# Patient Record
Sex: Female | Born: 1965 | Race: White | Hispanic: No | Marital: Single | Smoking: Current every day smoker
Health system: Southern US, Community
[De-identification: ages and names within clinical notes are randomized; demographics above are authoritative.]

## PROBLEM LIST (undated history)

## (undated) DIAGNOSIS — K861 Other chronic pancreatitis: Secondary | ICD-10-CM

## (undated) DIAGNOSIS — E039 Hypothyroidism, unspecified: Secondary | ICD-10-CM

## (undated) DIAGNOSIS — F1010 Alcohol abuse, uncomplicated: Secondary | ICD-10-CM

## (undated) DIAGNOSIS — I10 Essential (primary) hypertension: Secondary | ICD-10-CM

## (undated) DIAGNOSIS — B1920 Unspecified viral hepatitis C without hepatic coma: Secondary | ICD-10-CM

## (undated) DIAGNOSIS — F1011 Alcohol abuse, in remission: Secondary | ICD-10-CM

## (undated) DIAGNOSIS — F329 Major depressive disorder, single episode, unspecified: Secondary | ICD-10-CM

## (undated) DIAGNOSIS — C801 Malignant (primary) neoplasm, unspecified: Secondary | ICD-10-CM

## (undated) DIAGNOSIS — F32A Depression, unspecified: Secondary | ICD-10-CM

## (undated) DIAGNOSIS — R569 Unspecified convulsions: Secondary | ICD-10-CM

## (undated) DIAGNOSIS — M545 Low back pain, unspecified: Secondary | ICD-10-CM

## (undated) DIAGNOSIS — F1991 Other psychoactive substance use, unspecified, in remission: Secondary | ICD-10-CM

## (undated) DIAGNOSIS — K863 Pseudocyst of pancreas: Secondary | ICD-10-CM

## (undated) DIAGNOSIS — K315 Obstruction of duodenum: Secondary | ICD-10-CM

## (undated) DIAGNOSIS — J449 Chronic obstructive pulmonary disease, unspecified: Secondary | ICD-10-CM

## (undated) DIAGNOSIS — G894 Chronic pain syndrome: Secondary | ICD-10-CM

## (undated) DIAGNOSIS — J45909 Unspecified asthma, uncomplicated: Secondary | ICD-10-CM

## (undated) DIAGNOSIS — Z87898 Personal history of other specified conditions: Secondary | ICD-10-CM

## (undated) HISTORY — DX: Chronic pain syndrome: G89.4

## (undated) HISTORY — DX: Alcohol abuse, in remission: F10.11

## (undated) HISTORY — DX: Obstruction of duodenum: K31.5

## (undated) HISTORY — PX: TOTAL THYROIDECTOMY: SHX2547

## (undated) HISTORY — DX: Essential (primary) hypertension: I10

## (undated) HISTORY — DX: Pseudocyst of pancreas: K86.3

## (undated) HISTORY — DX: Low back pain, unspecified: M54.50

## (undated) HISTORY — DX: Low back pain: M54.5

---

## 1999-04-27 ENCOUNTER — Encounter: Payer: Self-pay | Admitting: Emergency Medicine

## 1999-04-27 ENCOUNTER — Emergency Department (HOSPITAL_COMMUNITY): Admission: EM | Admit: 1999-04-27 | Discharge: 1999-04-27 | Payer: Self-pay | Admitting: Emergency Medicine

## 2006-12-22 ENCOUNTER — Emergency Department: Payer: Self-pay | Admitting: Emergency Medicine

## 2007-12-13 ENCOUNTER — Emergency Department: Payer: Self-pay | Admitting: Unknown Physician Specialty

## 2008-07-15 ENCOUNTER — Inpatient Hospital Stay: Payer: Self-pay | Admitting: Internal Medicine

## 2009-09-15 HISTORY — PX: GASTROSTOMY TUBE PLACEMENT: SHX655

## 2010-05-24 ENCOUNTER — Emergency Department: Payer: Self-pay | Admitting: Emergency Medicine

## 2010-06-02 ENCOUNTER — Emergency Department: Payer: Self-pay | Admitting: Internal Medicine

## 2010-06-06 ENCOUNTER — Emergency Department: Payer: Self-pay | Admitting: Emergency Medicine

## 2010-06-12 ENCOUNTER — Inpatient Hospital Stay: Payer: Self-pay | Admitting: Psychiatry

## 2010-07-24 ENCOUNTER — Emergency Department: Payer: Self-pay | Admitting: Emergency Medicine

## 2010-10-25 ENCOUNTER — Inpatient Hospital Stay: Payer: Self-pay | Admitting: Internal Medicine

## 2010-10-27 ENCOUNTER — Emergency Department: Payer: Self-pay | Admitting: Emergency Medicine

## 2010-11-03 ENCOUNTER — Inpatient Hospital Stay: Payer: Self-pay | Admitting: Internal Medicine

## 2010-11-21 ENCOUNTER — Inpatient Hospital Stay: Payer: Self-pay | Admitting: Internal Medicine

## 2010-12-26 DIAGNOSIS — F1911 Other psychoactive substance abuse, in remission: Secondary | ICD-10-CM | POA: Insufficient documentation

## 2010-12-26 DIAGNOSIS — F329 Major depressive disorder, single episode, unspecified: Secondary | ICD-10-CM | POA: Insufficient documentation

## 2010-12-26 DIAGNOSIS — F411 Generalized anxiety disorder: Secondary | ICD-10-CM | POA: Insufficient documentation

## 2011-01-01 ENCOUNTER — Inpatient Hospital Stay: Payer: Self-pay | Admitting: Surgery

## 2011-01-14 DIAGNOSIS — F17220 Nicotine dependence, chewing tobacco, uncomplicated: Secondary | ICD-10-CM | POA: Insufficient documentation

## 2011-01-14 DIAGNOSIS — F17200 Nicotine dependence, unspecified, uncomplicated: Secondary | ICD-10-CM | POA: Insufficient documentation

## 2011-01-31 ENCOUNTER — Emergency Department: Payer: Self-pay | Admitting: Emergency Medicine

## 2011-02-02 ENCOUNTER — Emergency Department: Payer: Self-pay | Admitting: Emergency Medicine

## 2011-02-14 DIAGNOSIS — K319 Disease of stomach and duodenum, unspecified: Secondary | ICD-10-CM | POA: Insufficient documentation

## 2011-03-30 ENCOUNTER — Observation Stay: Payer: Self-pay | Admitting: Internal Medicine

## 2011-04-07 ENCOUNTER — Emergency Department: Payer: Self-pay | Admitting: Emergency Medicine

## 2011-04-23 ENCOUNTER — Observation Stay: Payer: Self-pay | Admitting: Internal Medicine

## 2011-05-04 ENCOUNTER — Emergency Department: Payer: Self-pay | Admitting: Emergency Medicine

## 2011-05-16 DIAGNOSIS — K315 Obstruction of duodenum: Secondary | ICD-10-CM

## 2011-05-16 HISTORY — DX: Obstruction of duodenum: K31.5

## 2011-06-01 ENCOUNTER — Emergency Department: Payer: Self-pay | Admitting: Emergency Medicine

## 2011-06-04 ENCOUNTER — Emergency Department: Payer: Self-pay | Admitting: *Deleted

## 2011-06-19 ENCOUNTER — Emergency Department: Payer: Self-pay | Admitting: Emergency Medicine

## 2011-06-21 ENCOUNTER — Emergency Department: Payer: Self-pay | Admitting: Emergency Medicine

## 2011-06-23 ENCOUNTER — Emergency Department: Payer: Self-pay

## 2011-07-06 ENCOUNTER — Emergency Department: Payer: Self-pay | Admitting: Emergency Medicine

## 2011-07-24 ENCOUNTER — Emergency Department: Payer: Self-pay | Admitting: *Deleted

## 2011-07-24 ENCOUNTER — Emergency Department: Payer: Self-pay | Admitting: Unknown Physician Specialty

## 2011-07-30 ENCOUNTER — Emergency Department: Payer: Self-pay | Admitting: Unknown Physician Specialty

## 2011-08-11 ENCOUNTER — Emergency Department: Payer: Self-pay | Admitting: Emergency Medicine

## 2011-08-13 ENCOUNTER — Emergency Department: Payer: Self-pay | Admitting: Emergency Medicine

## 2011-08-21 ENCOUNTER — Inpatient Hospital Stay: Payer: Self-pay | Admitting: Student

## 2011-08-26 ENCOUNTER — Inpatient Hospital Stay: Payer: Self-pay | Admitting: Psychiatry

## 2011-09-01 ENCOUNTER — Emergency Department: Payer: Self-pay | Admitting: *Deleted

## 2011-09-15 ENCOUNTER — Emergency Department: Payer: Self-pay | Admitting: Emergency Medicine

## 2011-09-19 ENCOUNTER — Inpatient Hospital Stay: Payer: Self-pay | Admitting: Specialist

## 2011-09-19 LAB — URINALYSIS, COMPLETE
Bilirubin,UR: NEGATIVE
Leukocyte Esterase: NEGATIVE
Nitrite: NEGATIVE
Ph: 7 (ref 4.5–8.0)
Protein: NEGATIVE
Squamous Epithelial: <1

## 2011-09-19 LAB — COMPREHENSIVE METABOLIC PANEL
Alkaline Phosphatase: 111 U/L (ref 50–136)
Anion Gap: 10 (ref 7–16)
BUN: 9 mg/dL (ref 7–18)
Bilirubin,Total: 0.5 mg/dL (ref 0.2–1.0)
Calcium, Total: 8.4 mg/dL — ABNORMAL LOW (ref 8.5–10.1)
Chloride: 107 mmol/L (ref 98–107)
Co2: 23 mmol/L (ref 21–32)
Creatinine: 0.61 mg/dL (ref 0.60–1.30)
EGFR (African American): >60
EGFR (Non-African Amer.): >60
Osmolality: 280 (ref 275–301)
Potassium: 3.3 mmol/L — ABNORMAL LOW (ref 3.5–5.1)
SGOT(AST): 48 U/L — ABNORMAL HIGH (ref 15–37)
Sodium: 140 mmol/L (ref 136–145)

## 2011-09-19 LAB — CBC
HCT: 35.1 % (ref 35.0–47.0)
MCHC: 33.8 g/dL (ref 32.0–36.0)
MCV: 96 fL (ref 80–100)
Platelet: 165 10*3/uL (ref 150–440)
RDW: 14.8 % — ABNORMAL HIGH (ref 11.5–14.5)
WBC: 7.7 10*3/uL (ref 3.6–11.0)

## 2011-09-19 LAB — TSH: Thyroid Stimulating Horm: 0.566 u[IU]/mL

## 2011-09-19 LAB — LIPASE, BLOOD: Lipase: 75 U/L (ref 73–393)

## 2011-09-20 LAB — COMPREHENSIVE METABOLIC PANEL
Albumin: 2.8 g/dL — ABNORMAL LOW (ref 3.4–5.0)
Anion Gap: 7 (ref 7–16)
Calcium, Total: 7.8 mg/dL — ABNORMAL LOW (ref 8.5–10.1)
Chloride: 112 mmol/L — ABNORMAL HIGH (ref 98–107)
Co2: 27 mmol/L (ref 21–32)
EGFR (African American): >60
Glucose: 80 mg/dL (ref 65–99)
Osmolality: 286 (ref 275–301)
Potassium: 3.7 mmol/L (ref 3.5–5.1)
SGOT(AST): 32 U/L (ref 15–37)
Sodium: 146 mmol/L — ABNORMAL HIGH (ref 136–145)

## 2011-09-20 LAB — CBC WITH DIFFERENTIAL/PLATELET
Eosinophil %: 2.5 %
HCT: 31.2 % — ABNORMAL LOW (ref 35.0–47.0)
Lymphocyte #: 1.9 10*3/uL (ref 1.0–3.6)
Lymphocyte %: 42.9 %
MCH: 32.2 pg (ref 26.0–34.0)
MCV: 97 fL (ref 80–100)
Monocyte #: 0.3 10*3/uL (ref 0.0–0.7)
Monocyte %: 6.2 %
Neutrophil %: 47.6 %
Platelet: 127 10*3/uL — ABNORMAL LOW (ref 150–440)

## 2011-09-20 LAB — AFP TUMOR MARKER: AFP-Tumor Marker: 2.6 ng/mL (ref 0.0–8.3)

## 2011-10-19 ENCOUNTER — Emergency Department: Payer: Self-pay | Admitting: Emergency Medicine

## 2011-10-19 LAB — COMPREHENSIVE METABOLIC PANEL
Albumin: 3.5 g/dL (ref 3.4–5.0)
Anion Gap: 13 (ref 7–16)
BUN: 12 mg/dL (ref 7–18)
Potassium: 3.2 mmol/L — ABNORMAL LOW (ref 3.5–5.1)
SGOT(AST): 40 U/L — ABNORMAL HIGH (ref 15–37)
SGPT (ALT): 30 U/L
Total Protein: 7.9 g/dL (ref 6.4–8.2)

## 2011-10-19 LAB — CBC
HCT: 35.2 % (ref 35.0–47.0)
MCHC: 33.9 g/dL (ref 32.0–36.0)
Platelet: 186 10*3/uL (ref 150–440)
RDW: 17.1 % — ABNORMAL HIGH (ref 11.5–14.5)
WBC: 6.1 10*3/uL (ref 3.6–11.0)

## 2011-10-19 LAB — LIPASE, BLOOD: Lipase: 124 U/L (ref 73–393)

## 2011-11-04 ENCOUNTER — Inpatient Hospital Stay: Payer: Self-pay | Admitting: Internal Medicine

## 2011-11-04 LAB — DRUG SCREEN, URINE
Amphetamines, Ur Screen: NEGATIVE (ref ?–1000)
Barbiturates, Ur Screen: NEGATIVE (ref ?–200)
Benzodiazepine, Ur Scrn: NEGATIVE (ref ?–200)
Cannabinoid 50 Ng, Ur ~~LOC~~: POSITIVE (ref ?–50)
MDMA (Ecstasy)Ur Screen: NEGATIVE (ref ?–500)
Methadone, Ur Screen: NEGATIVE (ref ?–300)
Phencyclidine (PCP) Ur S: NEGATIVE (ref ?–25)
Tricyclic, Ur Screen: NEGATIVE (ref ?–1000)

## 2011-11-04 LAB — URINALYSIS, COMPLETE
Blood: NEGATIVE
Nitrite: NEGATIVE
Protein: NEGATIVE
RBC,UR: 2 /HPF (ref 0–5)
Specific Gravity: 1.055 (ref 1.003–1.030)
WBC UR: 14 /HPF (ref 0–5)

## 2011-11-04 LAB — CBC
HGB: 11.9 g/dL — ABNORMAL LOW (ref 12.0–16.0)
MCH: 32.5 pg (ref 26.0–34.0)
MCHC: 33.7 g/dL (ref 32.0–36.0)
MCV: 96 fL (ref 80–100)
Platelet: 160 10*3/uL (ref 150–440)
RBC: 3.68 10*6/uL — ABNORMAL LOW (ref 3.80–5.20)
RDW: 17.7 % — ABNORMAL HIGH (ref 11.5–14.5)
WBC: 8.2 10*3/uL (ref 3.6–11.0)

## 2011-11-04 LAB — COMPREHENSIVE METABOLIC PANEL
Albumin: 3.4 g/dL (ref 3.4–5.0)
Anion Gap: 13 (ref 7–16)
BUN: 11 mg/dL (ref 7–18)
Bilirubin,Total: 0.9 mg/dL (ref 0.2–1.0)
Calcium, Total: 8.2 mg/dL — ABNORMAL LOW (ref 8.5–10.1)
Co2: 22 mmol/L (ref 21–32)
Creatinine: 0.61 mg/dL (ref 0.60–1.30)
Glucose: 81 mg/dL (ref 65–99)
Osmolality: 278 (ref 275–301)
Potassium: 3.4 mmol/L — ABNORMAL LOW (ref 3.5–5.1)
SGOT(AST): 60 U/L — ABNORMAL HIGH (ref 15–37)
Sodium: 140 mmol/L (ref 136–145)
Total Protein: 7.6 g/dL (ref 6.4–8.2)

## 2011-11-04 LAB — HEMOGLOBIN: HGB: 10.9 g/dL — ABNORMAL LOW (ref 12.0–16.0)

## 2011-11-04 LAB — PROTIME-INR
INR: 0.9
Prothrombin Time: 12.8 secs (ref 11.5–14.7)

## 2011-11-04 LAB — AMYLASE: Amylase: 51 U/L (ref 25–115)

## 2011-11-04 LAB — ETHANOL: Ethanol %: 0.04 % (ref 0.000–0.080)

## 2011-11-04 LAB — TROPONIN I: Troponin-I: <0.02 ng/mL

## 2011-11-05 LAB — LIPID PANEL
Cholesterol: 128 mg/dL (ref 0–200)
Ldl Cholesterol, Calc: 37 mg/dL (ref 0–100)
Triglycerides: 117 mg/dL (ref 0–200)
VLDL Cholesterol, Calc: 23 mg/dL (ref 5–40)

## 2011-11-05 LAB — BASIC METABOLIC PANEL
BUN: 6 mg/dL — ABNORMAL LOW (ref 7–18)
Calcium, Total: 7.5 mg/dL — ABNORMAL LOW (ref 8.5–10.1)
Chloride: 110 mmol/L — ABNORMAL HIGH (ref 98–107)
Co2: 18 mmol/L — ABNORMAL LOW (ref 21–32)
EGFR (Non-African Amer.): >60
Glucose: 50 mg/dL — ABNORMAL LOW (ref 65–99)
Osmolality: 276 (ref 275–301)
Potassium: 3.9 mmol/L (ref 3.5–5.1)

## 2011-11-05 LAB — CBC WITH DIFFERENTIAL/PLATELET
Basophil #: 0 10*3/uL (ref 0.0–0.1)
Eosinophil %: 0.8 %
Lymphocyte #: 1 10*3/uL (ref 1.0–3.6)
Lymphocyte %: 18.3 %
MCH: 32.7 pg (ref 26.0–34.0)
MCV: 97 fL (ref 80–100)
Monocyte %: 5.6 %
Platelet: 114 10*3/uL — ABNORMAL LOW (ref 150–440)
RBC: 3.04 10*6/uL — ABNORMAL LOW (ref 3.80–5.20)
RDW: 17.5 % — ABNORMAL HIGH (ref 11.5–14.5)
WBC: 5.4 10*3/uL (ref 3.6–11.0)

## 2011-11-05 LAB — FOLATE: Folic Acid: 20.3 ng/mL (ref 3.1–100.0)

## 2011-11-05 LAB — TROPONIN I: Troponin-I: <0.02 ng/mL

## 2011-11-07 ENCOUNTER — Inpatient Hospital Stay: Payer: Self-pay | Admitting: Psychiatry

## 2011-11-08 LAB — MAGNESIUM: Magnesium: 1.5 mg/dL — ABNORMAL LOW

## 2011-11-09 LAB — URINALYSIS, COMPLETE
Bilirubin,UR: NEGATIVE
Blood: NEGATIVE
Ketone: NEGATIVE
Specific Gravity: 1.012 (ref 1.003–1.030)
Squamous Epithelial: 14
WBC UR: 176 /HPF (ref 0–5)

## 2012-03-14 ENCOUNTER — Emergency Department: Payer: Self-pay | Admitting: Emergency Medicine

## 2012-03-14 LAB — AMYLASE: Amylase: 48 U/L (ref 25–115)

## 2012-03-14 LAB — URINALYSIS, COMPLETE
Bacteria: NONE SEEN
Bilirubin,UR: NEGATIVE
Blood: NEGATIVE
Glucose,UR: NEGATIVE mg/dL (ref 0–75)
Leukocyte Esterase: NEGATIVE
Nitrite: NEGATIVE
Ph: 7 (ref 4.5–8.0)
Specific Gravity: 1.013 (ref 1.003–1.030)
Squamous Epithelial: 4
WBC UR: <1 /HPF (ref 0–5)

## 2012-03-14 LAB — COMPREHENSIVE METABOLIC PANEL
Alkaline Phosphatase: 139 U/L — ABNORMAL HIGH (ref 50–136)
Bilirubin,Total: 0.6 mg/dL (ref 0.2–1.0)
Calcium, Total: 8.8 mg/dL (ref 8.5–10.1)
Chloride: 104 mmol/L (ref 98–107)
Co2: 23 mmol/L (ref 21–32)
EGFR (African American): >60
Glucose: 112 mg/dL — ABNORMAL HIGH (ref 65–99)
SGPT (ALT): 32 U/L
Total Protein: 8.7 g/dL — ABNORMAL HIGH (ref 6.4–8.2)

## 2012-03-14 LAB — CBC WITH DIFFERENTIAL/PLATELET
Basophil #: 0.1 10*3/uL (ref 0.0–0.1)
Basophil %: 0.6 %
Eosinophil %: 0.4 %
HCT: 37.4 % (ref 35.0–47.0)
HGB: 12.4 g/dL (ref 12.0–16.0)
Lymphocyte #: 2.5 10*3/uL (ref 1.0–3.6)
MCH: 33.6 pg (ref 26.0–34.0)
MCHC: 33.2 g/dL (ref 32.0–36.0)
Monocyte %: 7.9 %
Neutrophil #: 8.3 10*3/uL — ABNORMAL HIGH (ref 1.4–6.5)
Neutrophil %: 70.1 %
Platelet: 196 10*3/uL (ref 150–440)
RBC: 3.7 10*6/uL — ABNORMAL LOW (ref 3.80–5.20)
WBC: 11.8 10*3/uL — ABNORMAL HIGH (ref 3.6–11.0)

## 2012-03-14 LAB — PREGNANCY, URINE: Pregnancy Test, Urine: NEGATIVE m[IU]/mL

## 2012-03-15 ENCOUNTER — Emergency Department: Payer: Self-pay | Admitting: Emergency Medicine

## 2012-03-15 LAB — URINALYSIS, COMPLETE
Bilirubin,UR: NEGATIVE
Blood: NEGATIVE
Glucose,UR: NEGATIVE mg/dL (ref 0–75)
Leukocyte Esterase: NEGATIVE
Nitrite: NEGATIVE
Ph: 7 (ref 4.5–8.0)
Protein: 30
RBC,UR: 1 /HPF (ref 0–5)
Specific Gravity: 1.026 (ref 1.003–1.030)
Squamous Epithelial: 7
WBC UR: 3 /HPF (ref 0–5)

## 2012-03-15 LAB — COMPREHENSIVE METABOLIC PANEL
Albumin: 3.3 g/dL — ABNORMAL LOW (ref 3.4–5.0)
Alkaline Phosphatase: 126 U/L (ref 50–136)
Anion Gap: 8 (ref 7–16)
BUN: 12 mg/dL (ref 7–18)
Bilirubin,Total: 0.8 mg/dL (ref 0.2–1.0)
Calcium, Total: 8.8 mg/dL (ref 8.5–10.1)
Chloride: 99 mmol/L (ref 98–107)
Co2: 30 mmol/L (ref 21–32)
Creatinine: 0.85 mg/dL (ref 0.60–1.30)
EGFR (African American): >60
EGFR (Non-African Amer.): >60
Glucose: 130 mg/dL — ABNORMAL HIGH (ref 65–99)
Osmolality: 275 (ref 275–301)
Potassium: 3.1 mmol/L — ABNORMAL LOW (ref 3.5–5.1)
SGOT(AST): 37 U/L (ref 15–37)
SGPT (ALT): 26 U/L
Sodium: 137 mmol/L (ref 136–145)
Total Protein: 9 g/dL — ABNORMAL HIGH (ref 6.4–8.2)

## 2012-03-15 LAB — CBC
HCT: 36.8 % (ref 35.0–47.0)
HGB: 12.2 g/dL (ref 12.0–16.0)
MCH: 33 pg (ref 26.0–34.0)
MCHC: 33.1 g/dL (ref 32.0–36.0)
MCV: 100 fL (ref 80–100)
Platelet: 195 10*3/uL (ref 150–440)
RBC: 3.68 10*6/uL — ABNORMAL LOW (ref 3.80–5.20)
RDW: 16.5 % — ABNORMAL HIGH (ref 11.5–14.5)
WBC: 11.6 10*3/uL — ABNORMAL HIGH (ref 3.6–11.0)

## 2012-03-15 LAB — LIPASE, BLOOD: Lipase: 121 U/L (ref 73–393)

## 2012-03-16 LAB — URINE CULTURE

## 2012-04-23 DIAGNOSIS — R109 Unspecified abdominal pain: Secondary | ICD-10-CM | POA: Insufficient documentation

## 2012-08-14 ENCOUNTER — Emergency Department: Payer: Self-pay | Admitting: Emergency Medicine

## 2012-08-14 LAB — CBC
HCT: 37.5 % (ref 35.0–47.0)
HGB: 13 g/dL (ref 12.0–16.0)
MCH: 38.4 pg — ABNORMAL HIGH (ref 26.0–34.0)
MCHC: 34.7 g/dL (ref 32.0–36.0)
MCV: 111 fL — ABNORMAL HIGH (ref 80–100)
Platelet: 199 10*3/uL (ref 150–440)
RBC: 3.39 10*6/uL — ABNORMAL LOW (ref 3.80–5.20)
RDW: 13.3 % (ref 11.5–14.5)
WBC: 10.1 10*3/uL (ref 3.6–11.0)

## 2012-08-14 LAB — COMPREHENSIVE METABOLIC PANEL
Albumin: 3.9 g/dL (ref 3.4–5.0)
Alkaline Phosphatase: 95 U/L (ref 50–136)
Anion Gap: 11 (ref 7–16)
BUN: 11 mg/dL (ref 7–18)
Bilirubin,Total: 0.7 mg/dL (ref 0.2–1.0)
Calcium, Total: 8.4 mg/dL — ABNORMAL LOW (ref 8.5–10.1)
Chloride: 106 mmol/L (ref 98–107)
Co2: 20 mmol/L — ABNORMAL LOW (ref 21–32)
Creatinine: 0.53 mg/dL — ABNORMAL LOW (ref 0.60–1.30)
EGFR (African American): >60
EGFR (Non-African Amer.): >60
Glucose: 83 mg/dL (ref 65–99)
Osmolality: 272 (ref 275–301)
Potassium: 3.7 mmol/L (ref 3.5–5.1)
SGOT(AST): 95 U/L — ABNORMAL HIGH (ref 15–37)
SGPT (ALT): 56 U/L (ref 12–78)
Sodium: 137 mmol/L (ref 136–145)
Total Protein: 8.8 g/dL — ABNORMAL HIGH (ref 6.4–8.2)

## 2012-08-14 LAB — URINALYSIS, COMPLETE
Bacteria: NONE SEEN
Bilirubin,UR: NEGATIVE
Glucose,UR: NEGATIVE mg/dL (ref 0–75)
Leukocyte Esterase: NEGATIVE
Nitrite: NEGATIVE
Ph: 6 (ref 4.5–8.0)
Protein: NEGATIVE
RBC,UR: <1 /HPF (ref 0–5)
Specific Gravity: 1.002 (ref 1.003–1.030)
Squamous Epithelial: <1
WBC UR: NONE SEEN /HPF (ref 0–5)

## 2012-08-14 LAB — LIPASE, BLOOD: Lipase: 131 U/L (ref 73–393)

## 2013-01-04 IMAGING — US ABDOMEN ULTRASOUND
1 series · 17 of 25 positions shown · non-contrast
Comparison: none

REASON FOR EXAM: pain and vomiting with hx pancreatitis
COMMENTS:

[Series 1: abdomen ultrasound · 17 of 116 slices shown]
[im 1/116]
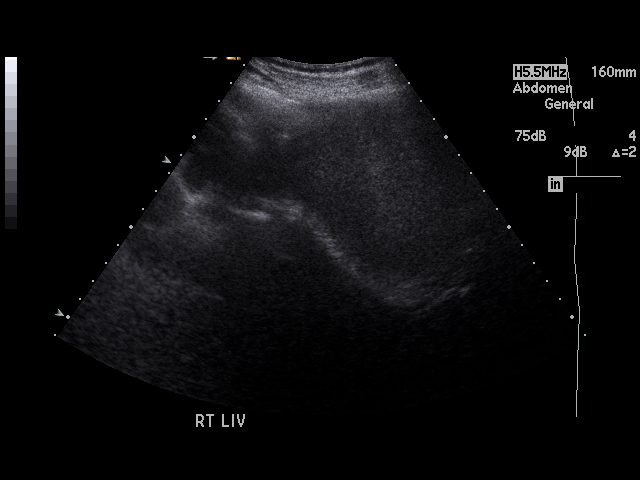
[im 10/116]
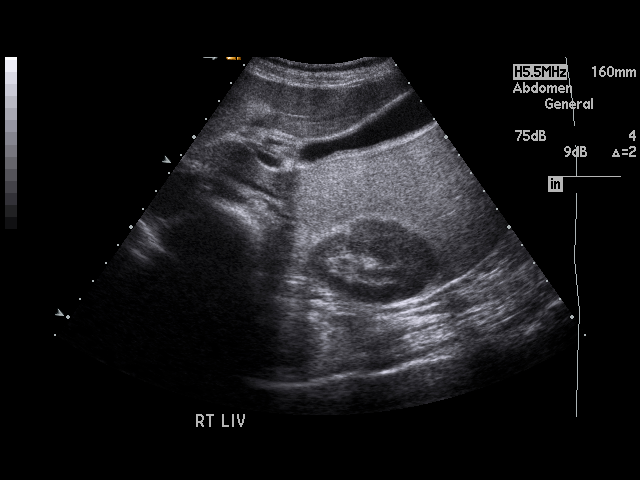
[im 15/116]
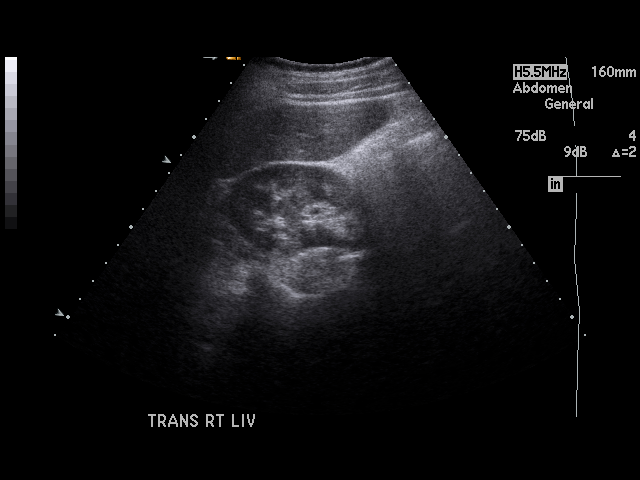
[im 24/116]
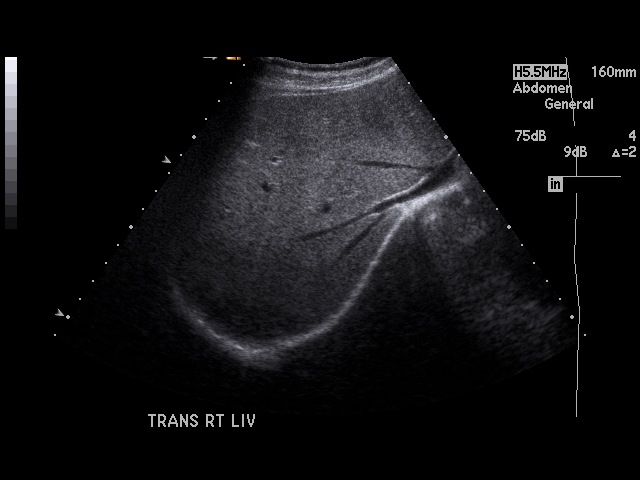
[im 29/116]
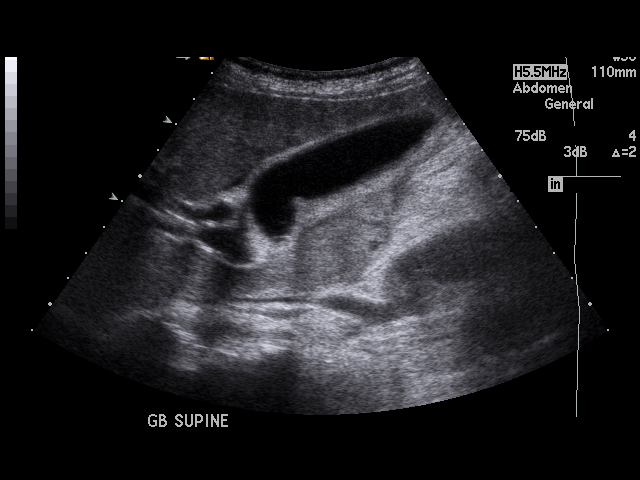
[im 39/116]
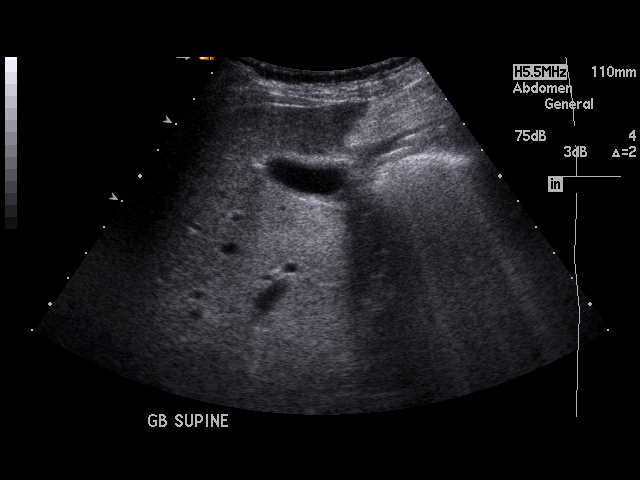
[im 44/116]
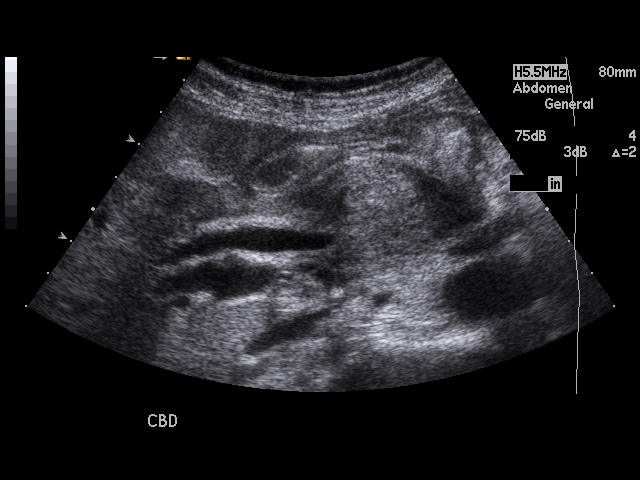
[im 53/116]
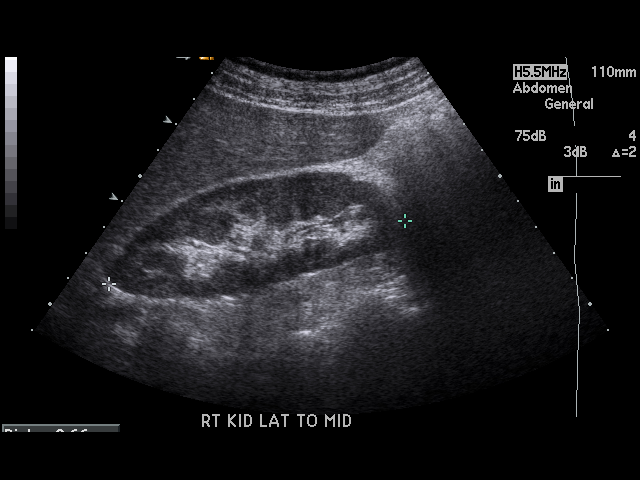
[im 58/116]
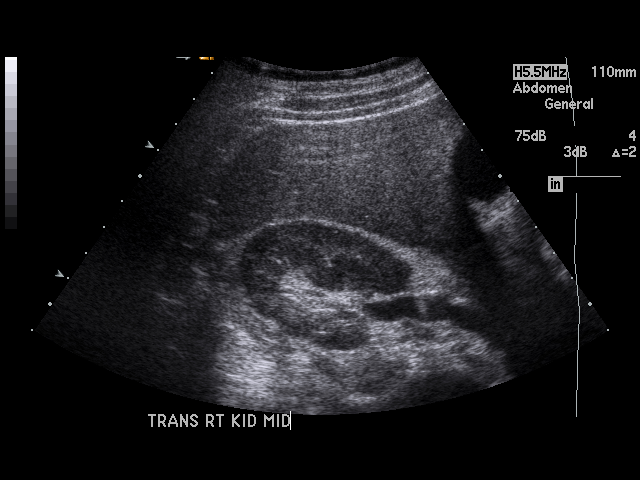
[im 63/116]
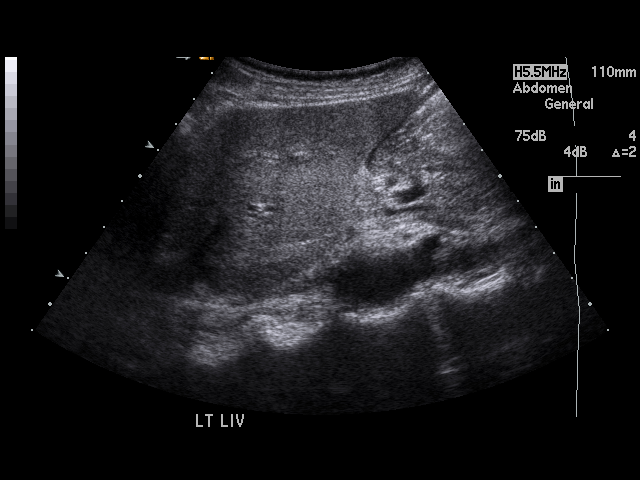
[im 72/116]
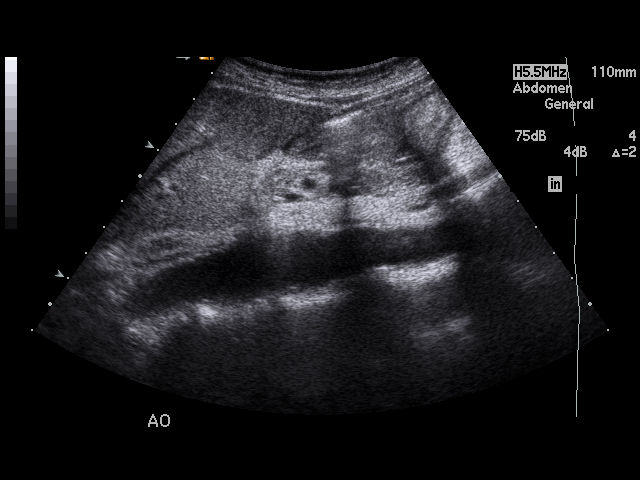
[im 77/116]
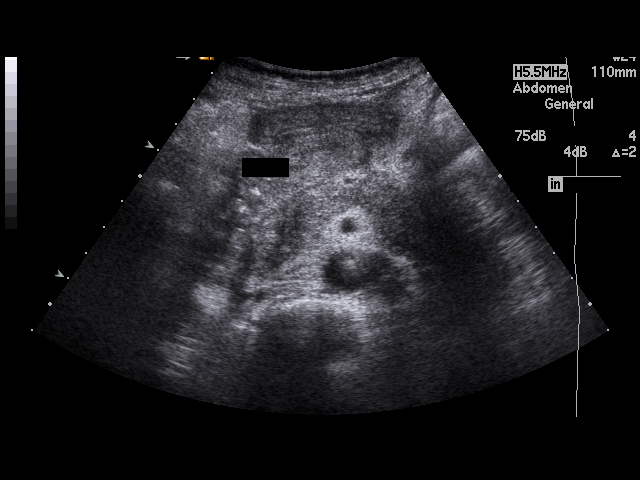
[im 87/116]
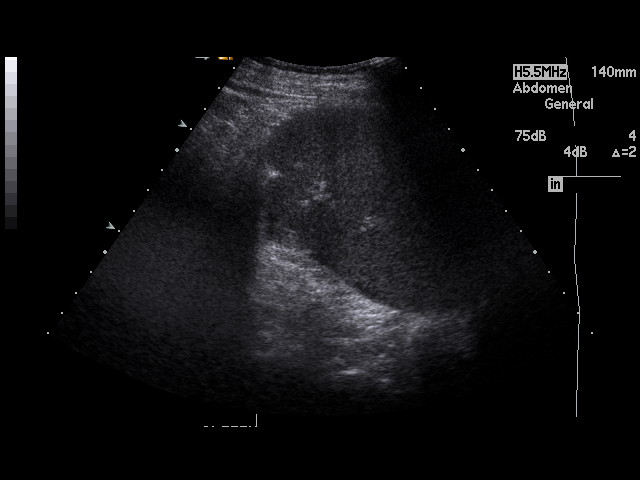
[im 92/116]
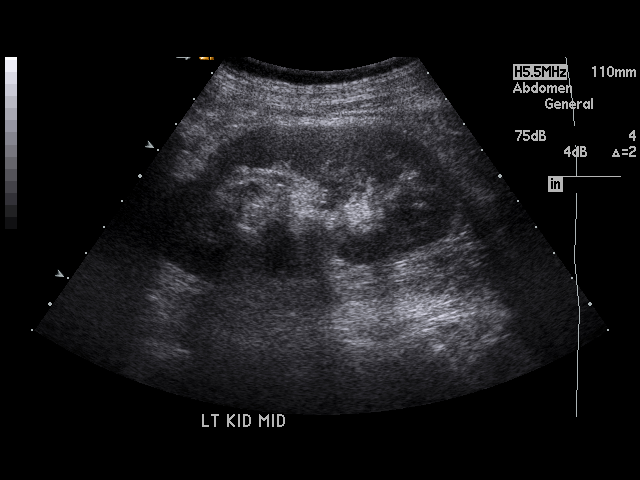
[im 101/116]
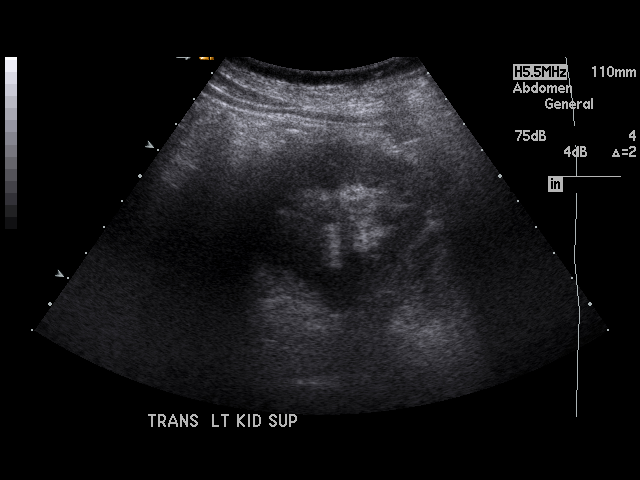
[im 106/116]
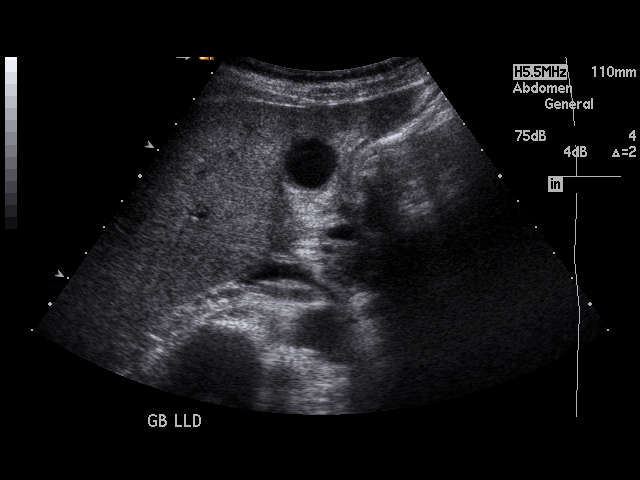
[im 116/116]
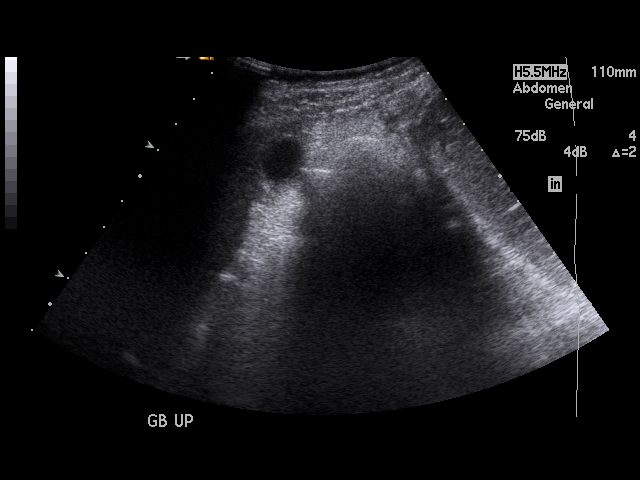

[17 of 25 positions shown; findings below may reference images not displayed]

PROCEDURE:     US  - US ABDOMEN GENERAL SURVEY  - April 23, 2011  [DATE]

RESULT:     Abdominal sonogram demonstrates an echogenic appearance of the
liver consistent with mild diffuse fatty infiltration. The gallbladder wall
is thickened to 3.6 mm. There is no sonographic Murphy's sign. The common
bile duct diameter is abnormal at 6.9 mm. There appears to be a trace amount
of pericholecystic fluid. Second measurement of the gallbladder wall is
mm. Right kidney length is 9.6 cm with the left kidney length of 9.68 cm.
Neither kidney shows obstruction or solid or cystic mass. The inferior vena
cava and abdominal aorta appear grossly normal in the areas included.
Pancreas is heterogeneous in echotexture.
IMPRESSION: Abnormal appearance of the gallbladder with a thickened
wall and a small amount of pericholecystic fluid. Common bile duct dilation
is present without definite intrahepatic biliary ductal dilation. An
echogenic appearance is noted in the hepatic parenchyma suggestive of fatty
infiltration. The pancreas itself is heterogeneous. The spleen and kidneys
appear to be normal other than the spleen being at the upper limits of
normal in size.

## 2013-02-15 IMAGING — CR DG ABDOMEN 1V
1 series · 1 of 1 positions shown · non-contrast
Comparison: none

REASON FOR EXAM: Tube verification
COMMENTS:

[view not recorded]
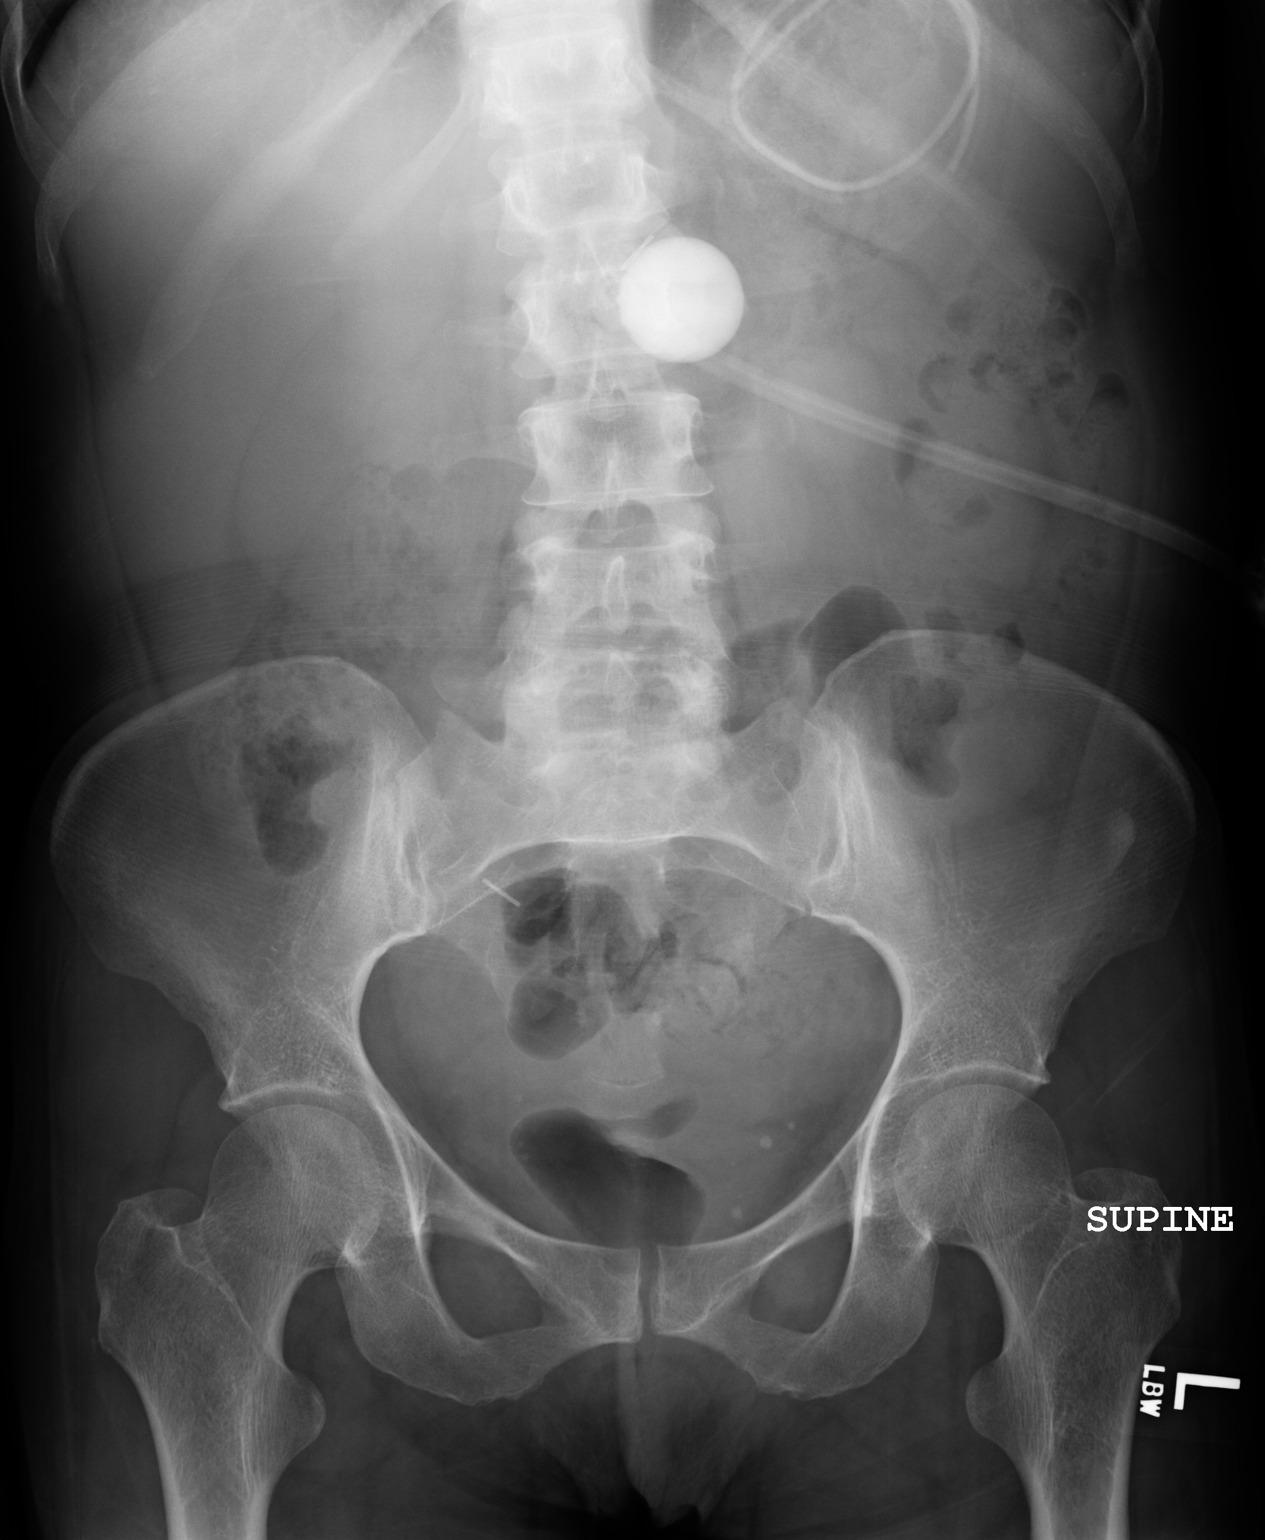

[1 of 1 positions shown; findings below may reference images not displayed]

PROCEDURE:     DXR - DXR ABDOMEN AP ONLY  - June 04, 2011  [DATE]

RESULT:     A single view of the abdomen is compared to the previous exam of
06/01/2011 and to images dated 05/04/2011.

Gastrostomy tube is present just to the left of midline at the L2 level. A
piece of tubing which does not appear to be connected to the retention
balloon is seen in the gastric fundus region. The bowel gas pattern is
unremarkable. The contrast seen previously in the colon has cleared.
IMPRESSION: Please see above.

## 2013-02-15 IMAGING — US ABDOMEN ULTRASOUND
1 series · 17 of 25 positions shown · non-contrast
Comparison: none

REASON FOR EXAM: elevated liver enzymes, h/o hep c.  R/o obstructive
etiology
COMMENTS:

PROCEDURE:     US  - US ABDOMEN GENERAL SURVEY  - June 04, 2011  [DATE]
RESULT:     Abdominal ultrasound dated 06/04/2011. The study was correlated
with a prior report dated 04/23/2011.

[Series 1: abdomen ultrasound · 17 of 101 slices shown]
[im 1/101]
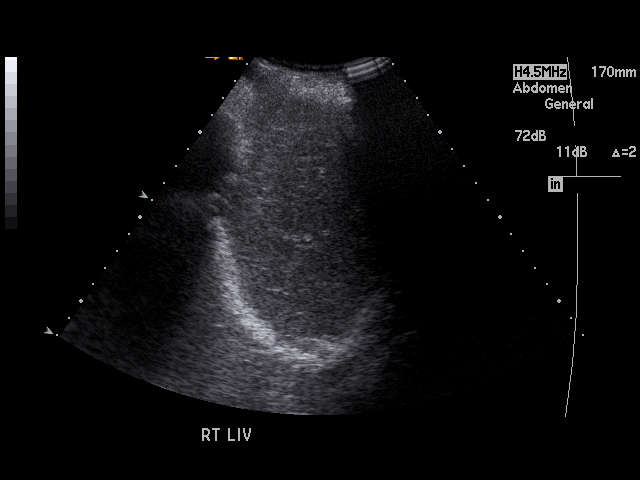
[im 9/101]
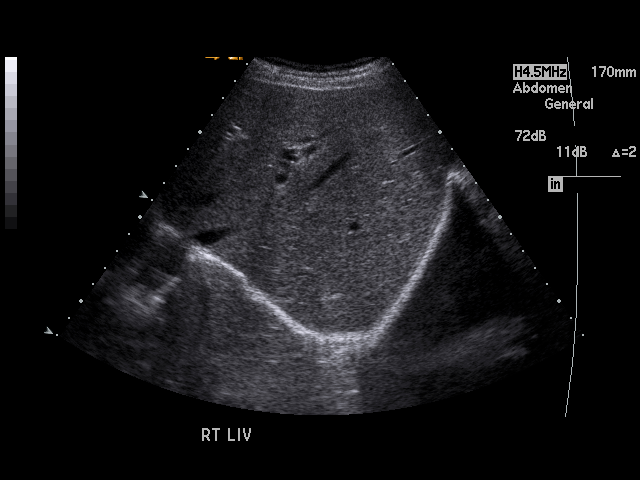
[im 13/101]
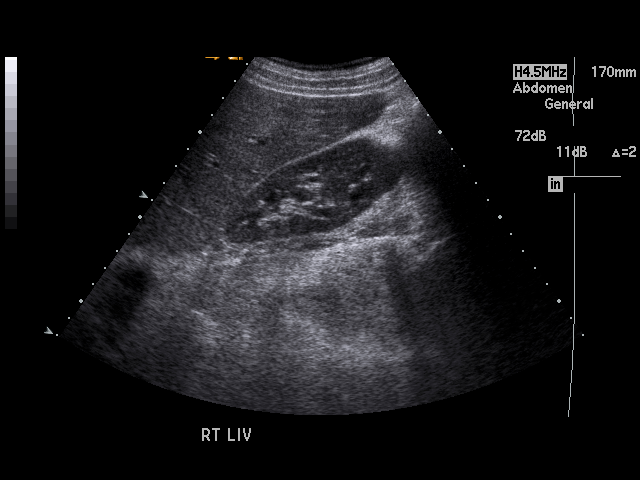
[im 21/101]
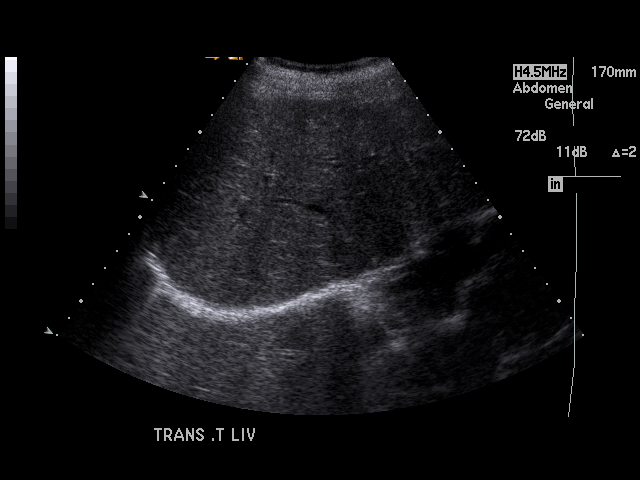
[im 26/101]
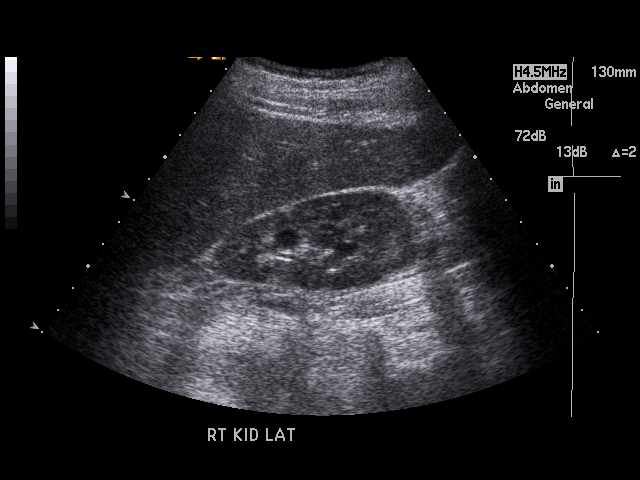
[im 34/101]
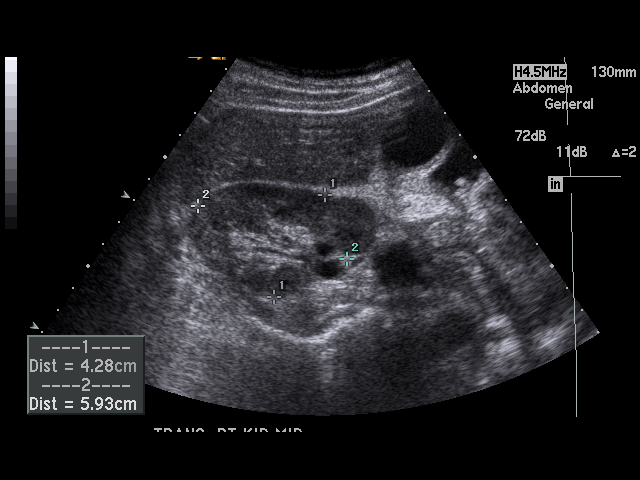
[im 38/101]
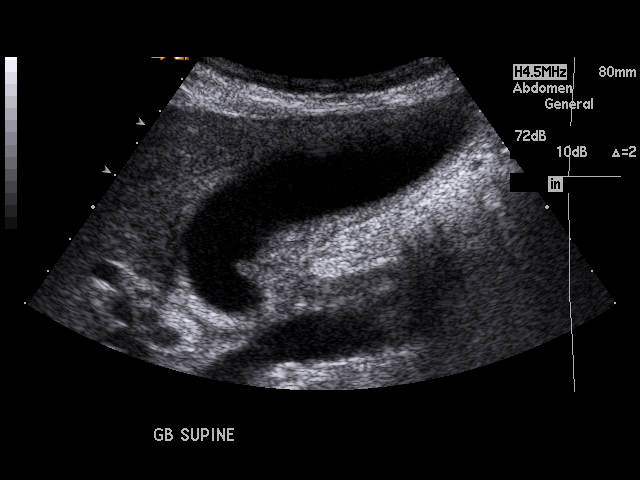
[im 46/101]
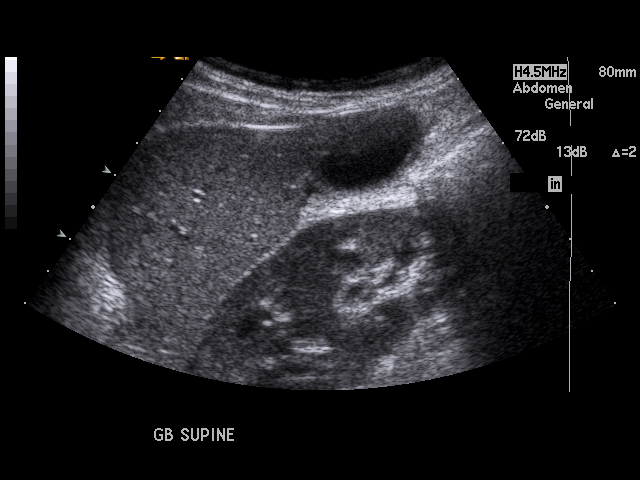
[im 51/101]
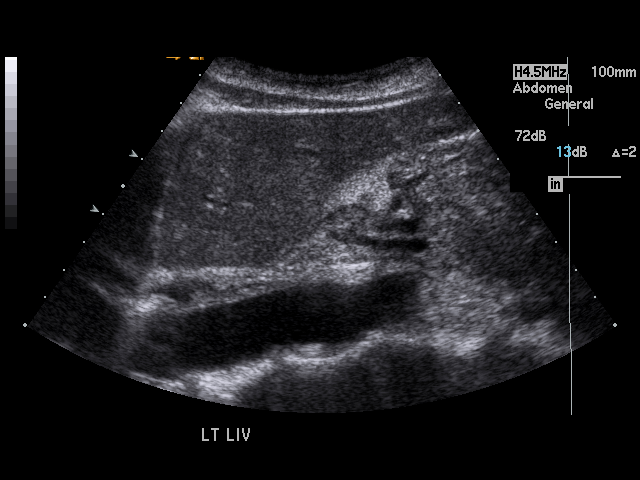
[im 55/101]
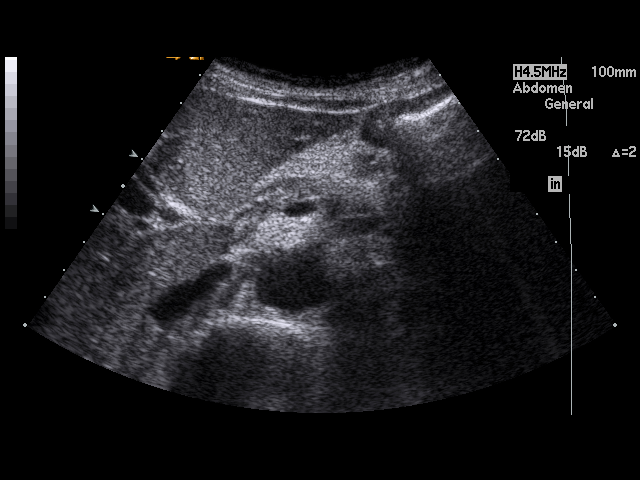
[im 63/101]
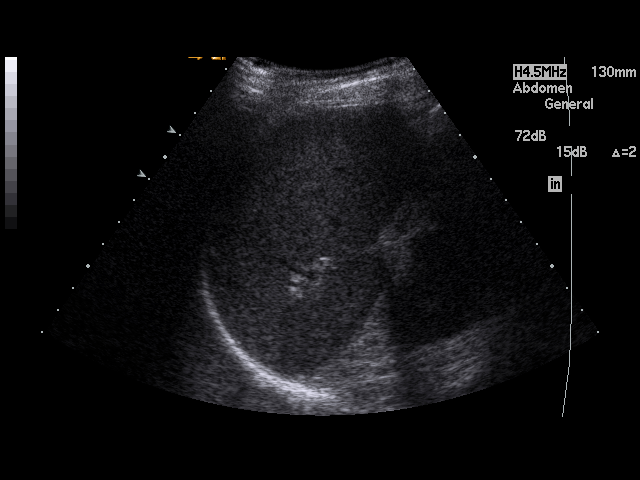
[im 67/101]
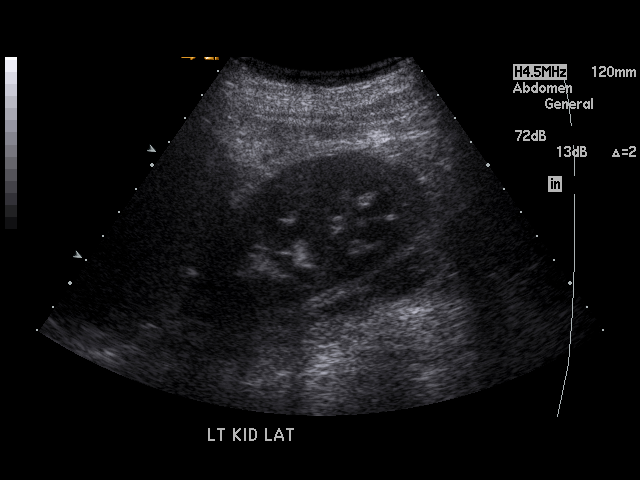
[im 76/101]
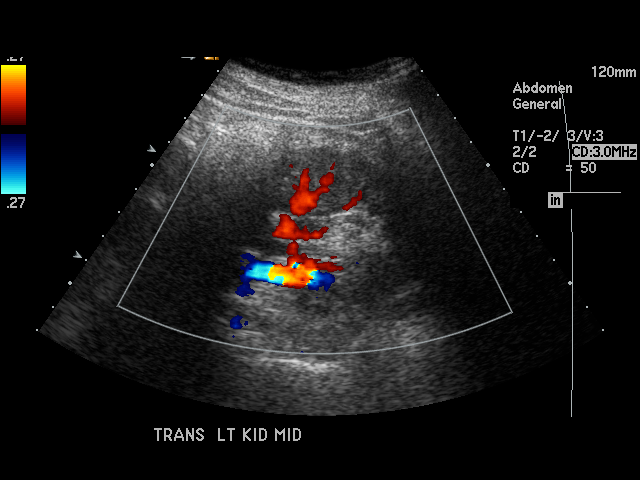
[im 80/101]
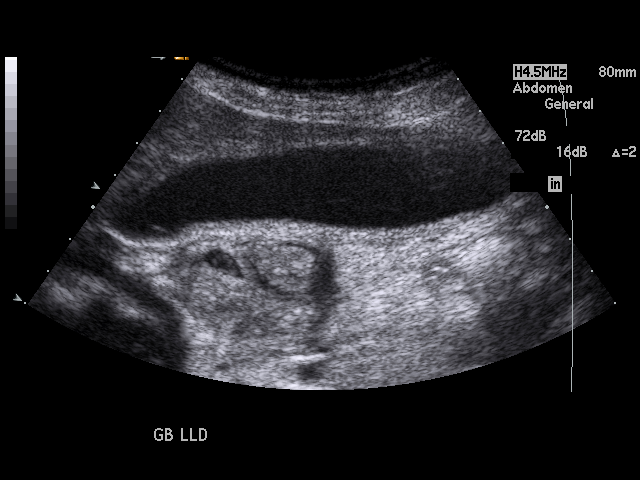
[im 88/101]
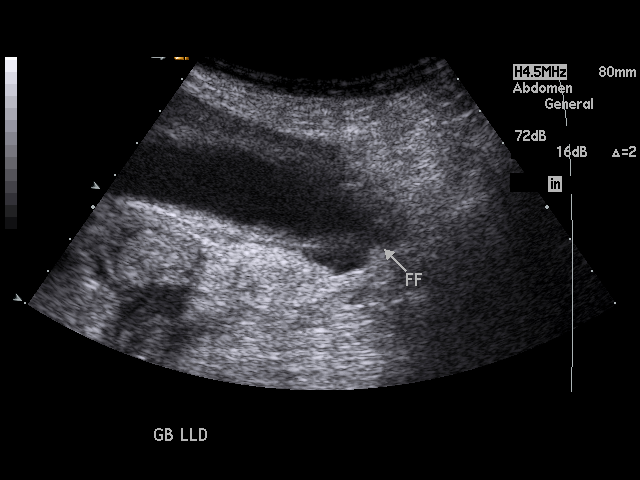
[im 92/101]
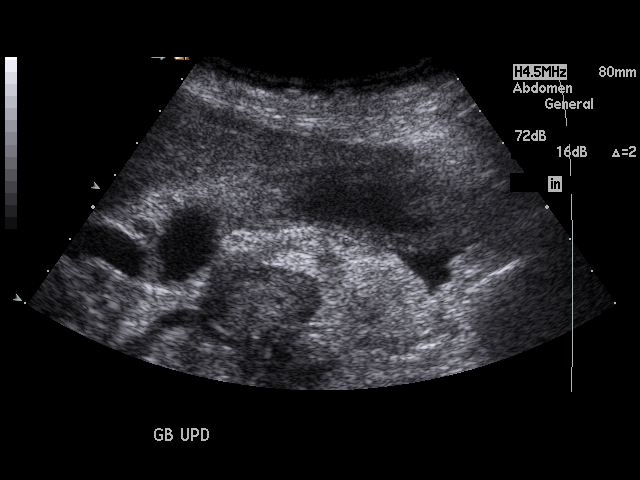
[im 101/101]
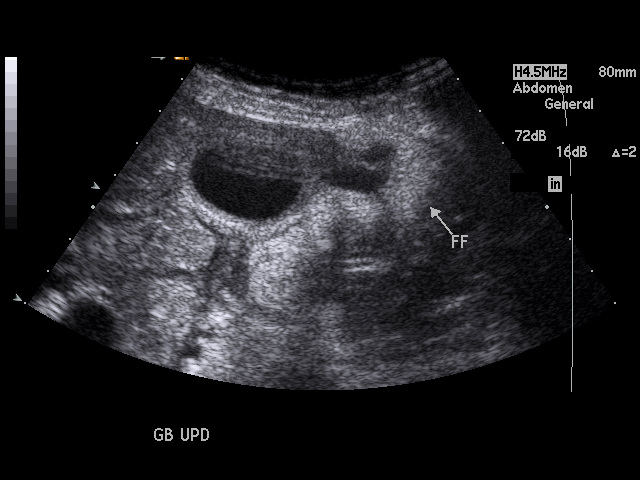

[17 of 25 positions shown; findings below may reference images not displayed]

FINDINGS: The liver demonstrates a homogeneous echotexture. Hepatopedal flow
is identified within the portal vein. The aorta and IVC are unremarkable.
The pancreas demonstrates a heterogeneous echotexture. The spleen
demonstrates a homogeneous echotexture and measures 11.41 cm in longitudinal
dimensions.

Evaluation of the gallbladder fossa demonstrates pericholecystic fluid along
the frontal region. Is no evidence sonographically appreciable gallstones.
There is no evidence of a sonographic Murphy's sign. The gallbladder wall is
thickened at 3.6 mm and the common bile duct dilated and 9.3 mm.

The right kidney measures 10.1 x 5.28 x 5.93 cm and the left 11.21 x 6.5 x
5.44 cm. There is no evidence of hydronephrosis masses or calculi.
IMPRESSION: Equivocal findings within the gallbladder fossa. There is
pericholecystic fluid, gallbladder wall thickening and dilatation of the
common bile duct. These findings may represent sequela of a previous
calculus within the system. No sonographically appreciable calculi at this
time. These findings are similar to previous findings though the common bile
that dilatation is greater.

## 2013-03-15 ENCOUNTER — Inpatient Hospital Stay: Payer: Self-pay | Admitting: Internal Medicine

## 2013-03-15 LAB — URINALYSIS, COMPLETE
Blood: NEGATIVE
Leukocyte Esterase: NEGATIVE
Nitrite: NEGATIVE
Protein: 30
Squamous Epithelial: 8
WBC UR: 1 /HPF (ref 0–5)

## 2013-03-15 LAB — COMPREHENSIVE METABOLIC PANEL
Albumin: 4.2 g/dL (ref 3.4–5.0)
BUN: 17 mg/dL (ref 7–18)
Bilirubin,Total: 1.3 mg/dL — ABNORMAL HIGH (ref 0.2–1.0)
Calcium, Total: 10.1 mg/dL (ref 8.5–10.1)
Chloride: 96 mmol/L — ABNORMAL LOW (ref 98–107)
Co2: 28 mmol/L (ref 21–32)
Creatinine: 0.74 mg/dL (ref 0.60–1.30)
EGFR (Non-African Amer.): >60
Osmolality: 275 (ref 275–301)
Potassium: 3.2 mmol/L — ABNORMAL LOW (ref 3.5–5.1)
SGOT(AST): 67 U/L — ABNORMAL HIGH (ref 15–37)
SGPT (ALT): 53 U/L (ref 12–78)
Sodium: 136 mmol/L (ref 136–145)

## 2013-03-15 LAB — CBC
HCT: 43.1 % (ref 35.0–47.0)
MCH: 37.7 pg — ABNORMAL HIGH (ref 26.0–34.0)
MCHC: 34.5 g/dL (ref 32.0–36.0)
MCV: 109 fL — ABNORMAL HIGH (ref 80–100)
Platelet: 197 10*3/uL (ref 150–440)
RBC: 3.94 10*6/uL (ref 3.80–5.20)
RDW: 13.4 % (ref 11.5–14.5)
WBC: 13.1 10*3/uL — ABNORMAL HIGH (ref 3.6–11.0)

## 2013-03-16 LAB — CBC WITH DIFFERENTIAL/PLATELET
Basophil %: 0.5 %
Eosinophil #: 0 10*3/uL (ref 0.0–0.7)
HGB: 12.2 g/dL (ref 12.0–16.0)
MCV: 109 fL — ABNORMAL HIGH (ref 80–100)
Monocyte #: 0.5 x10 3/mm (ref 0.2–0.9)
Monocyte %: 6.2 %
Neutrophil #: 6 10*3/uL (ref 1.4–6.5)
Neutrophil %: 79 %
Platelet: 135 10*3/uL — ABNORMAL LOW (ref 150–440)
RBC: 3.18 10*6/uL — ABNORMAL LOW (ref 3.80–5.20)
WBC: 7.7 10*3/uL (ref 3.6–11.0)

## 2013-03-16 LAB — COMPREHENSIVE METABOLIC PANEL
Alkaline Phosphatase: 92 U/L (ref 50–136)
Anion Gap: 10 (ref 7–16)
Bilirubin,Total: 0.9 mg/dL (ref 0.2–1.0)
Calcium, Total: 8.3 mg/dL — ABNORMAL LOW (ref 8.5–10.1)
Chloride: 99 mmol/L (ref 98–107)
Co2: 30 mmol/L (ref 21–32)
Creatinine: 0.62 mg/dL (ref 0.60–1.30)
Glucose: 77 mg/dL (ref 65–99)
Osmolality: 277 (ref 275–301)
SGPT (ALT): 40 U/L (ref 12–78)
Sodium: 139 mmol/L (ref 136–145)

## 2013-03-17 LAB — CBC WITH DIFFERENTIAL/PLATELET
Basophil #: 0 10*3/uL (ref 0.0–0.1)
Basophil %: 0.4 %
Eosinophil #: 0 10*3/uL (ref 0.0–0.7)
Eosinophil %: 0.4 %
HGB: 11.6 g/dL — ABNORMAL LOW (ref 12.0–16.0)
Lymphocyte #: 1 10*3/uL (ref 1.0–3.6)
Lymphocyte %: 12.7 %
MCHC: 34.5 g/dL (ref 32.0–36.0)
MCV: 111 fL — ABNORMAL HIGH (ref 80–100)
Monocyte #: 0.5 x10 3/mm (ref 0.2–0.9)
Neutrophil %: 80.7 %
WBC: 7.9 10*3/uL (ref 3.6–11.0)

## 2013-03-17 LAB — BASIC METABOLIC PANEL
Anion Gap: 7 (ref 7–16)
BUN: 12 mg/dL (ref 7–18)
Calcium, Total: 7.9 mg/dL — ABNORMAL LOW (ref 8.5–10.1)
Chloride: 109 mmol/L — ABNORMAL HIGH (ref 98–107)
Co2: 27 mmol/L (ref 21–32)
Creatinine: 0.48 mg/dL — ABNORMAL LOW (ref 0.60–1.30)
EGFR (African American): >60
EGFR (Non-African Amer.): >60
Osmolality: 284 (ref 275–301)
Sodium: 143 mmol/L (ref 136–145)

## 2013-03-17 LAB — MAGNESIUM: Magnesium: 1.9 mg/dL

## 2013-03-18 DIAGNOSIS — I10 Essential (primary) hypertension: Secondary | ICD-10-CM

## 2013-03-18 DIAGNOSIS — F1011 Alcohol abuse, in remission: Secondary | ICD-10-CM | POA: Insufficient documentation

## 2013-03-18 HISTORY — DX: Essential (primary) hypertension: I10

## 2013-03-18 LAB — CBC WITH DIFFERENTIAL/PLATELET
Basophil #: 0 10*3/uL (ref 0.0–0.1)
Eosinophil #: 0 10*3/uL (ref 0.0–0.7)
Eosinophil %: 0.3 %
HCT: 31.7 % — ABNORMAL LOW (ref 35.0–47.0)
HGB: 11.1 g/dL — ABNORMAL LOW (ref 12.0–16.0)
Lymphocyte %: 10.9 %
Monocyte #: 0.7 x10 3/mm (ref 0.2–0.9)
Neutrophil #: 8.1 10*3/uL — ABNORMAL HIGH (ref 1.4–6.5)
Platelet: 144 10*3/uL — ABNORMAL LOW (ref 150–440)
RBC: 2.87 10*6/uL — ABNORMAL LOW (ref 3.80–5.20)
RDW: 13.2 % (ref 11.5–14.5)
WBC: 9.9 10*3/uL (ref 3.6–11.0)

## 2013-03-18 LAB — BASIC METABOLIC PANEL
Anion Gap: 10 (ref 7–16)
BUN: 6 mg/dL — ABNORMAL LOW (ref 7–18)
Calcium, Total: 8.2 mg/dL — ABNORMAL LOW (ref 8.5–10.1)
Chloride: 110 mmol/L — ABNORMAL HIGH (ref 98–107)
Co2: 21 mmol/L (ref 21–32)
Creatinine: 0.44 mg/dL — ABNORMAL LOW (ref 0.60–1.30)
Glucose: 75 mg/dL (ref 65–99)
Osmolality: 278 (ref 275–301)
Potassium: 3.5 mmol/L (ref 3.5–5.1)
Sodium: 141 mmol/L (ref 136–145)

## 2013-11-27 IMAGING — CR DG ABDOMEN 2V
1 series · 3 of 3 positions shown · non-contrast
Comparison: none

REASON FOR EXAM: ABD PAIN, DISTENDED
COMMENTS:   May transport without cardiac monitor

[Series 1: x abdomen supine · 0.14mm/px · 3 of 3 slices shown]
[im 1/3]
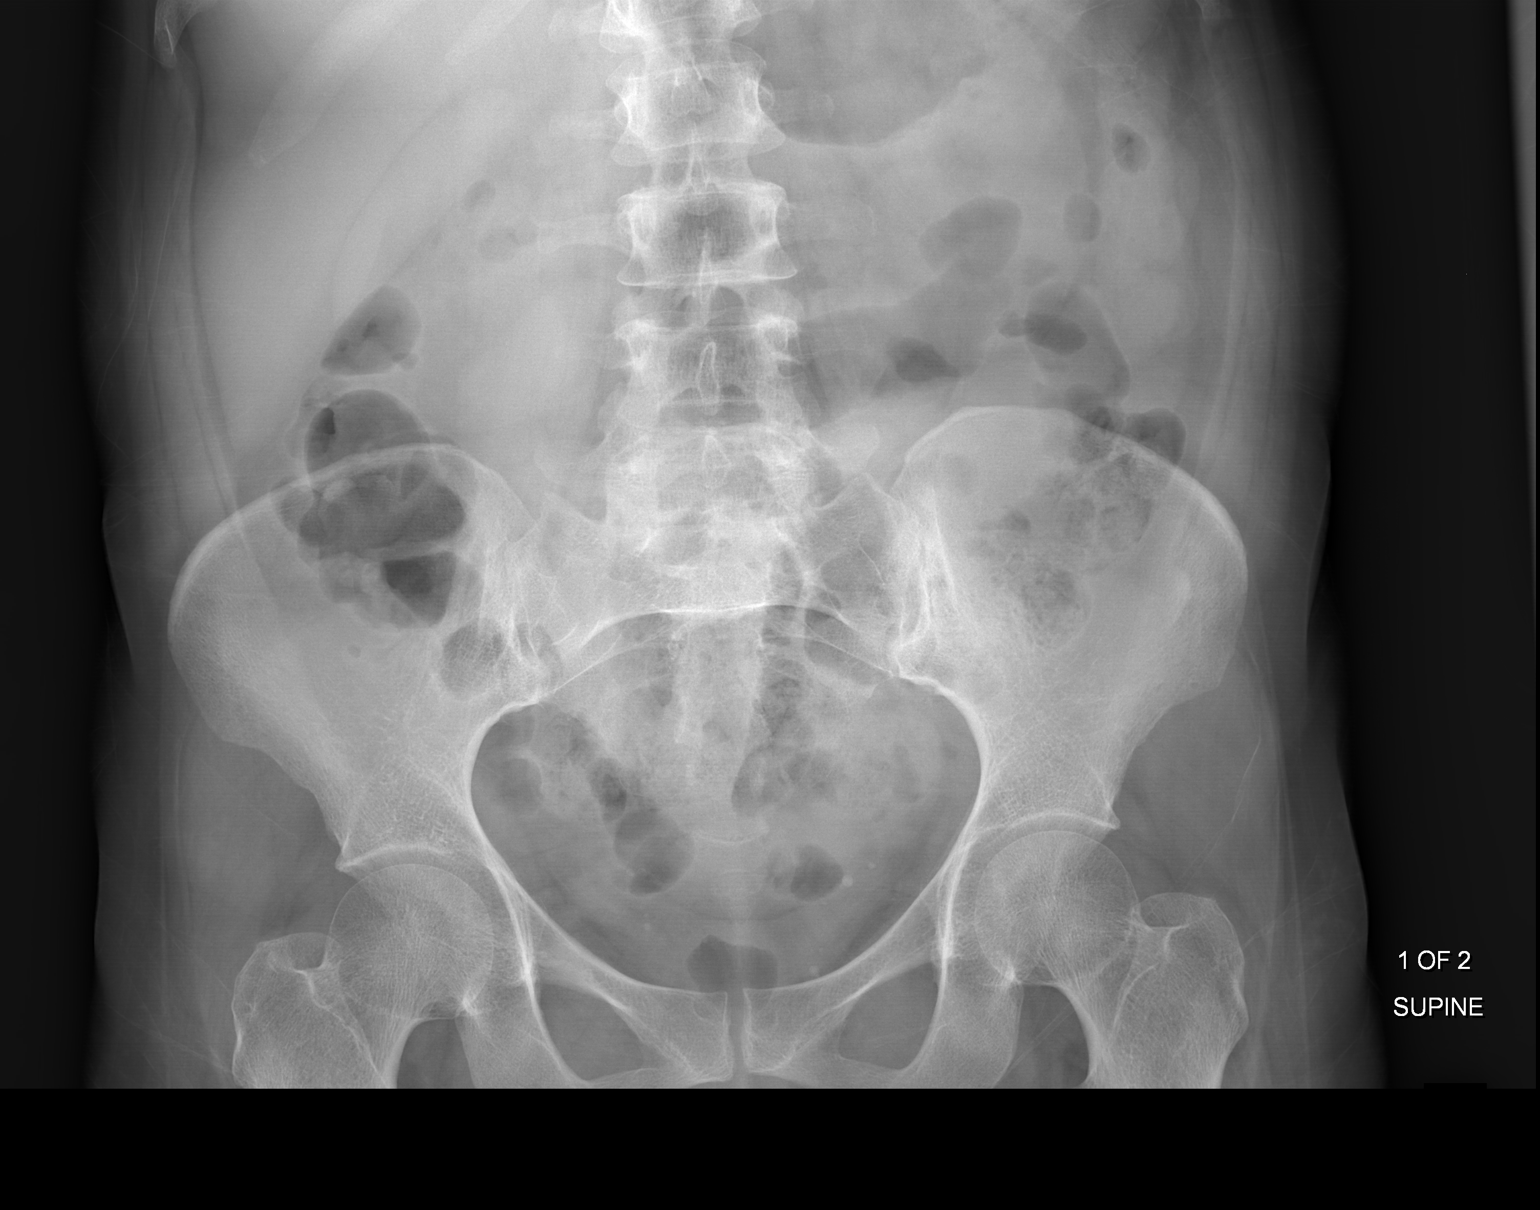
[im 2/3]
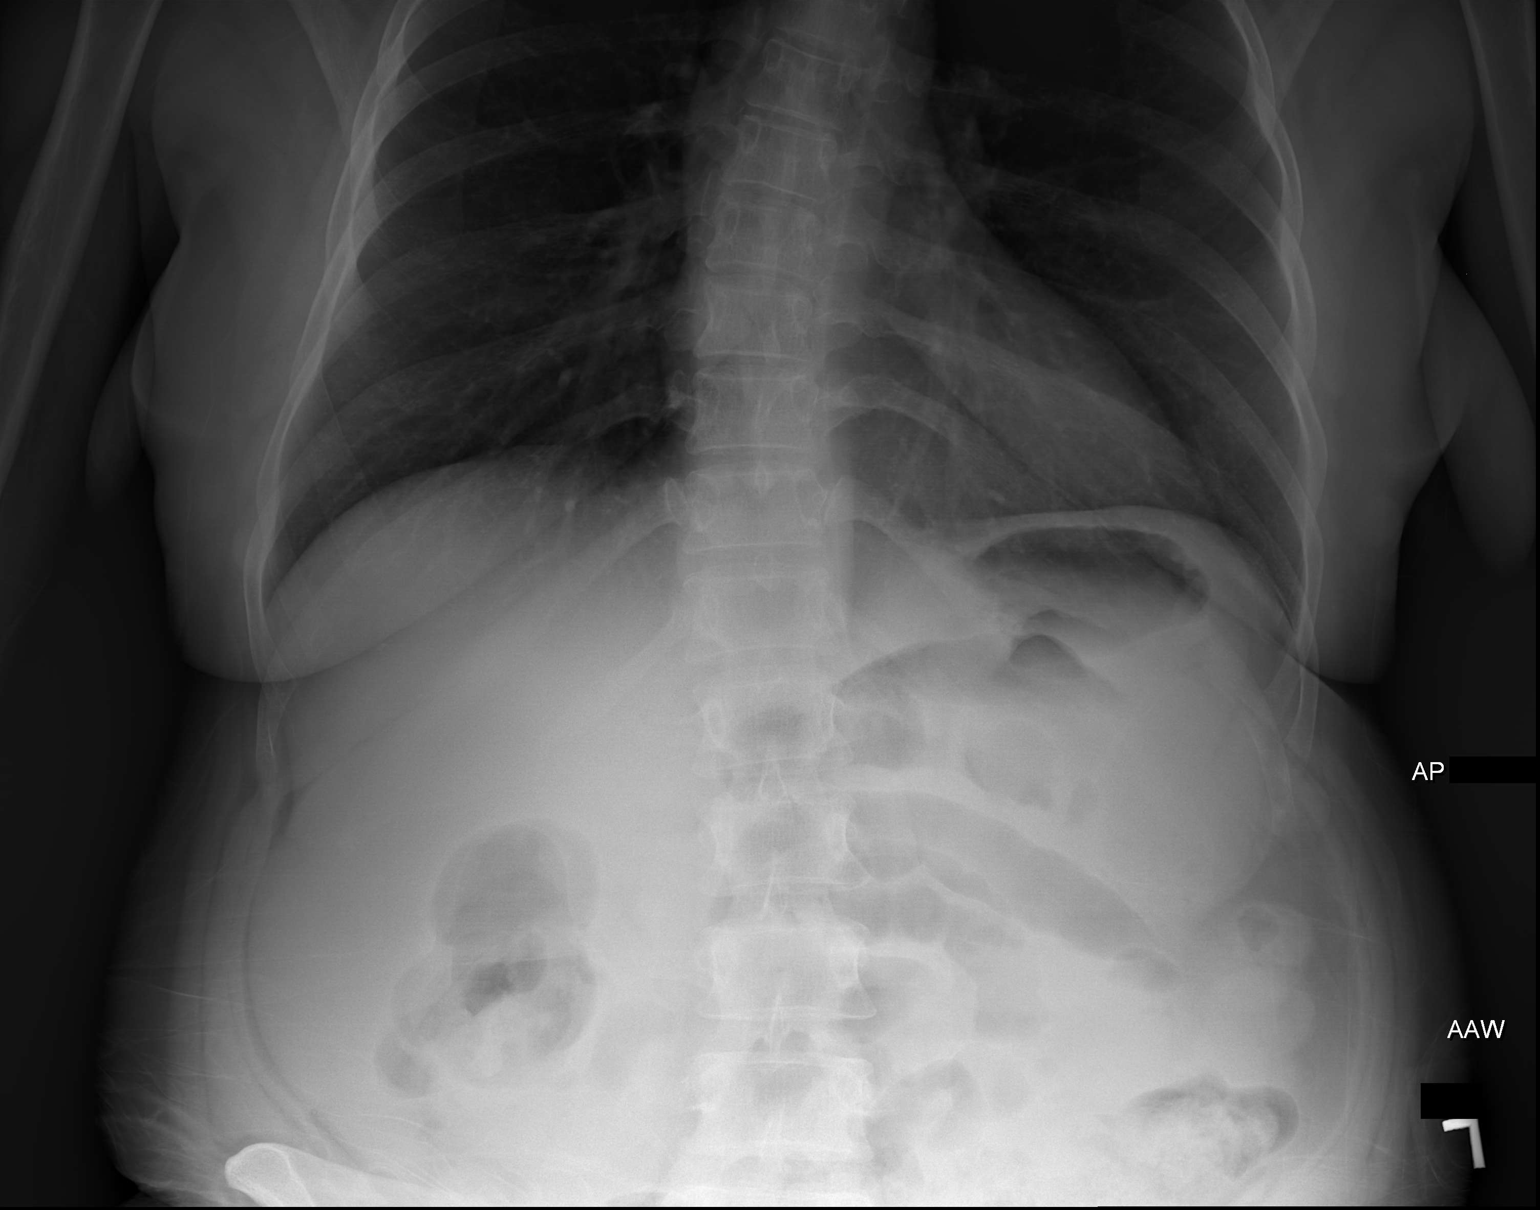
[im 3/3]
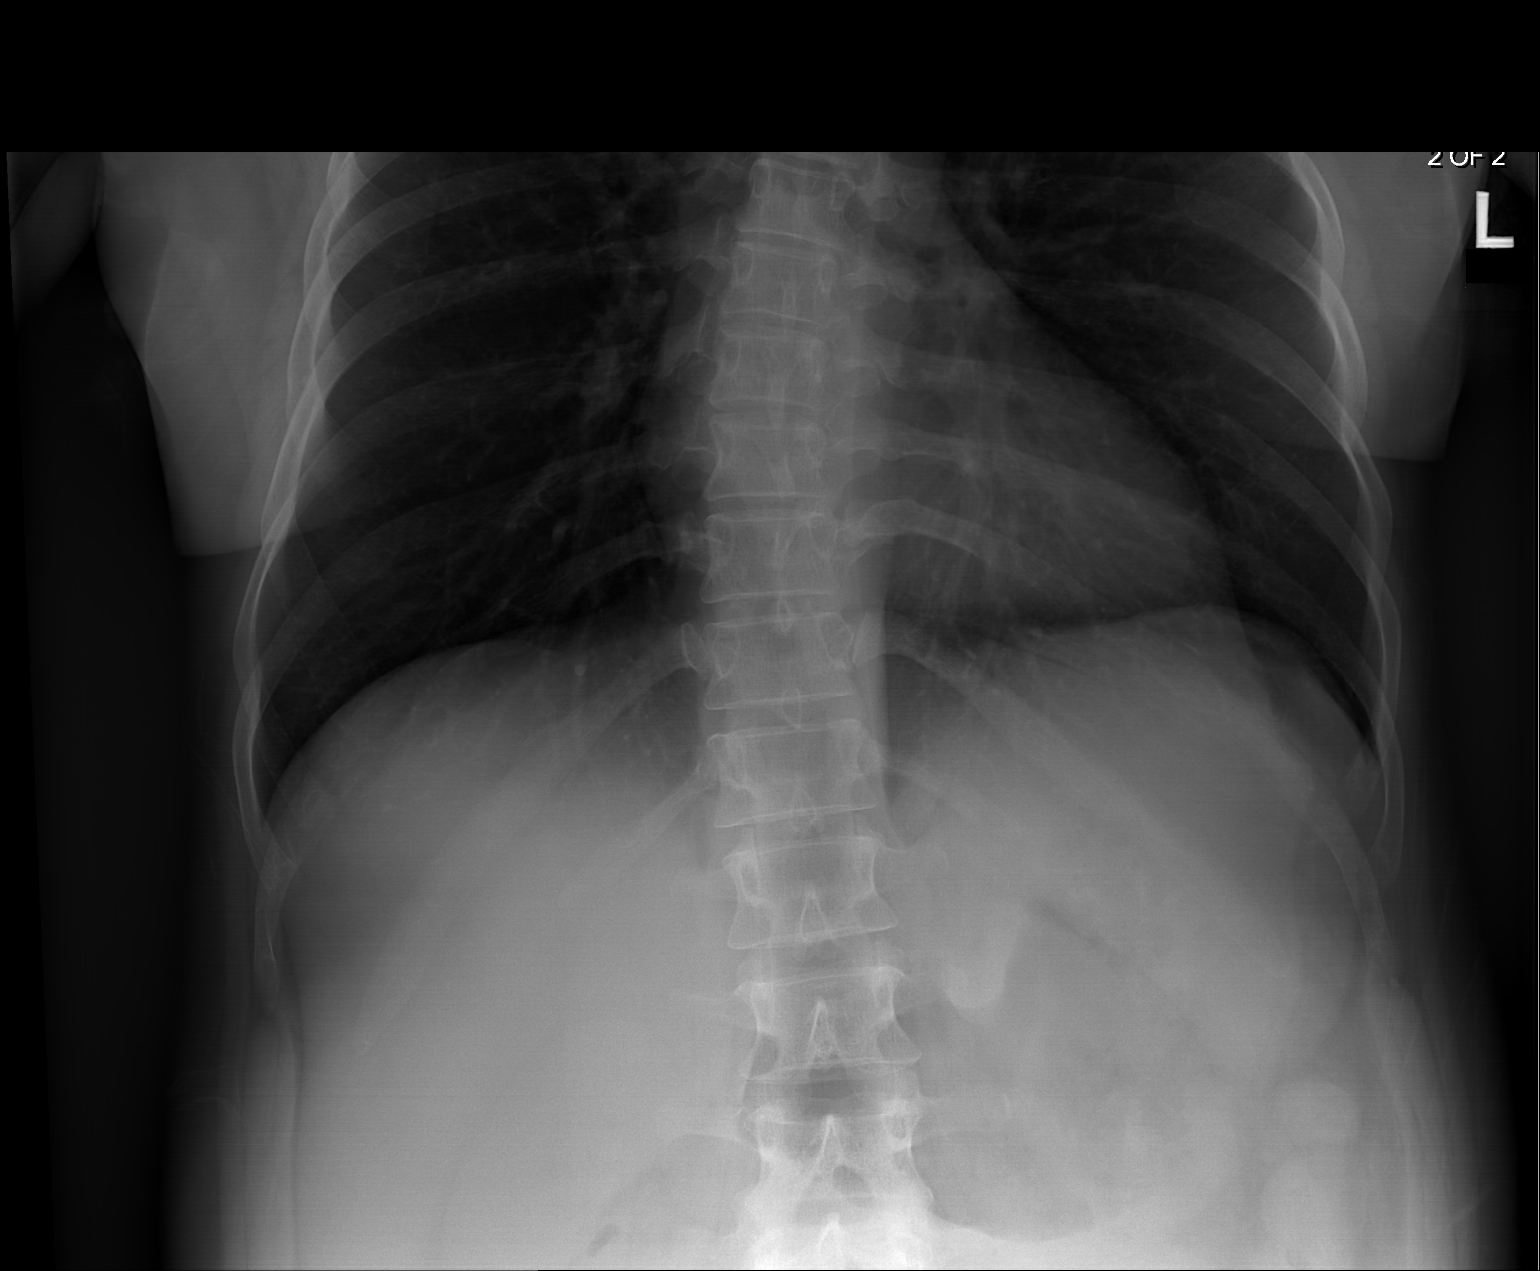

[3 of 3 positions shown; findings below may reference images not displayed]

PROCEDURE:     DXR - DXR ABDOMEN 2 V FLAT AND ERECT  - March 15, 2012  [DATE]

RESULT:     The lung bases appear clear. There are some air-filled loops of
small bowel. Air and fecal material scattered through the colon to the
rectum. Gastric air bubble is moderately large. There is no free air
evident. The lung bases appear clear.
IMPRESSION: Findings could represent mild ileus in the upper small
bowel loops. No definite evidence of obstruction.

[REDACTED]

## 2013-11-27 IMAGING — CR DG CHEST 1V
1 series · 1 of 1 positions shown · non-contrast
Comparison: none

REASON FOR EXAM: UPRIGHT
COMMENTS:   May transport without cardiac monitor

[x chest ap]
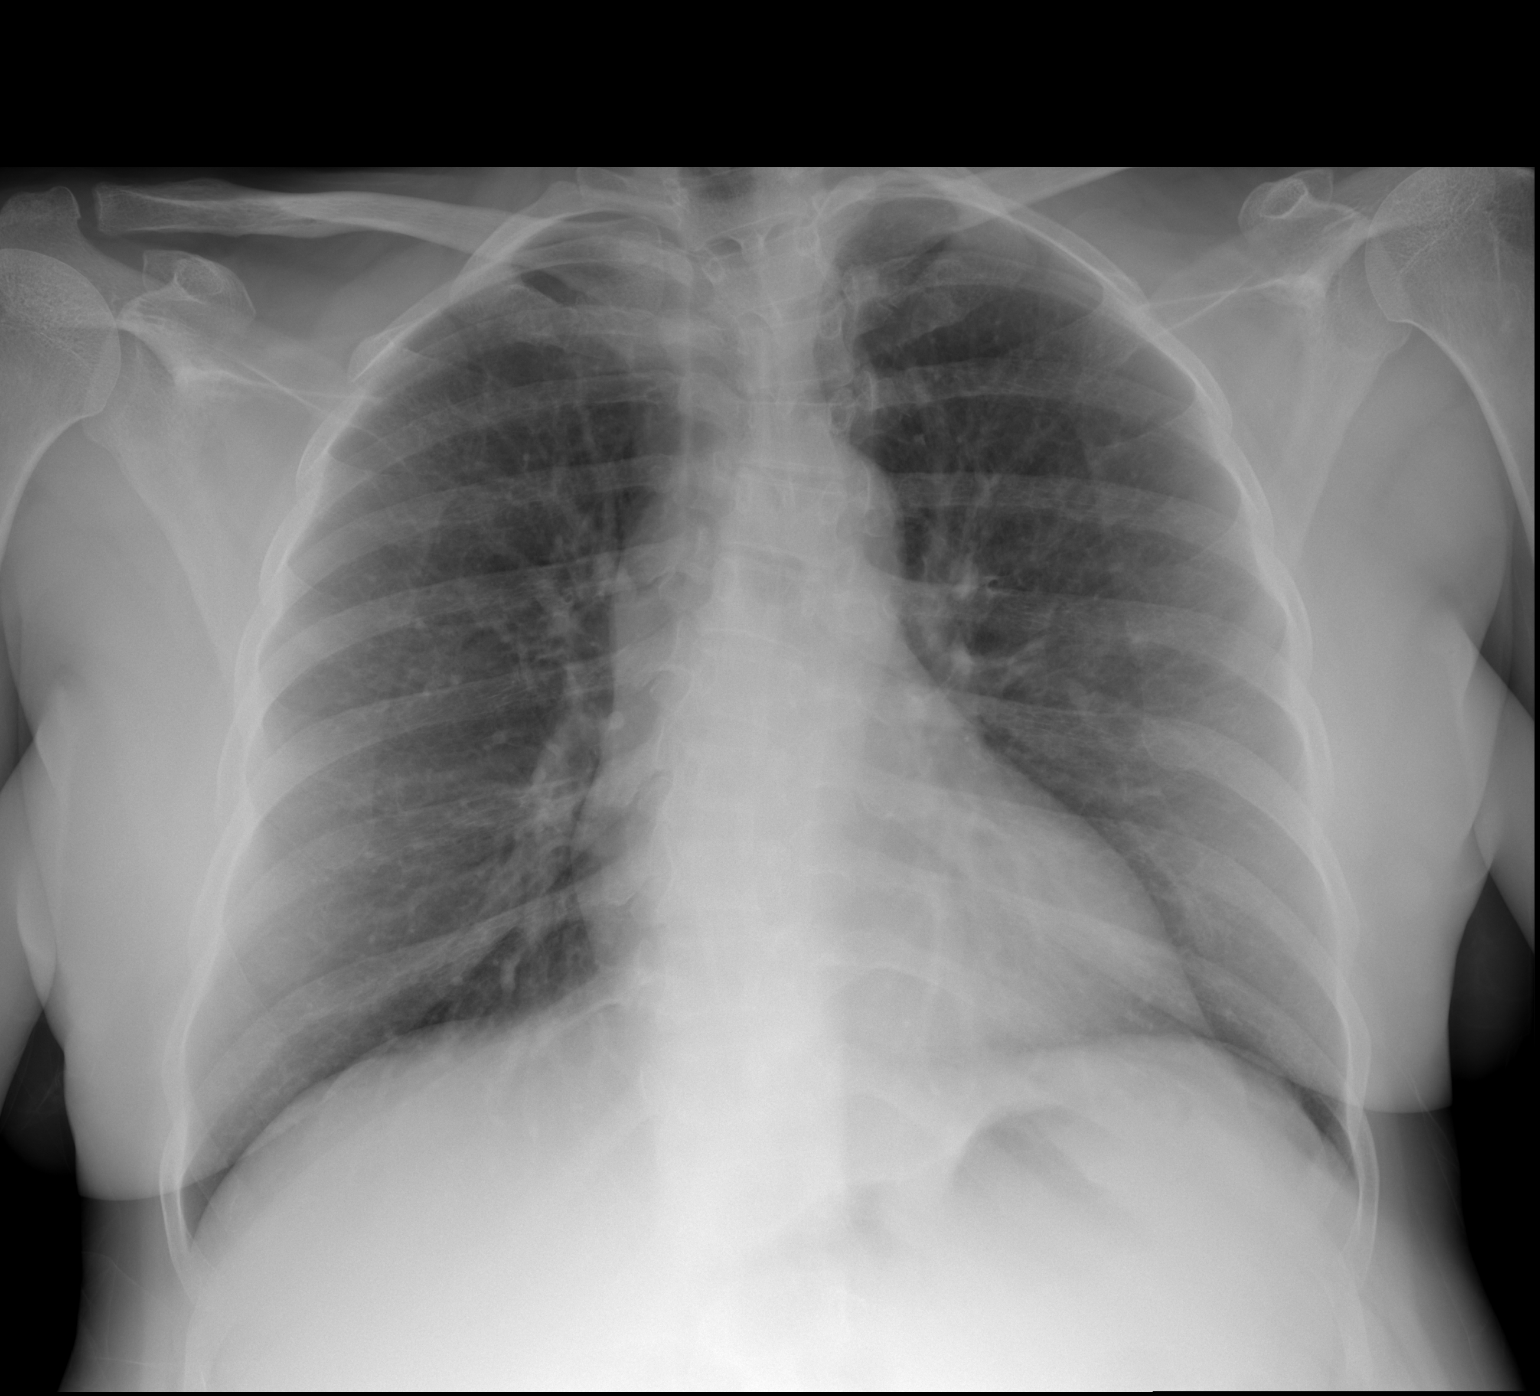

[1 of 1 positions shown; findings below may reference images not displayed]

PROCEDURE:     DXR - DXR CHEST 1 VIEWAP OR PA  - March 15, 2012  [DATE]

RESULT:     Comparison is made to the study dated 04 June, 2011.

The lungs are clear. The heart and pulmonary vessels are normal. The bony
and mediastinal structures are unremarkable. There is no effusion. There is
no pneumothorax or evidence of congestive failure.
IMPRESSION: No acute cardiopulmonary disease. [REDACTED]

## 2013-12-27 ENCOUNTER — Emergency Department: Payer: Self-pay | Admitting: Emergency Medicine

## 2013-12-27 LAB — ETHANOL
Ethanol %: 0.109 % — ABNORMAL HIGH (ref 0.000–0.080)
Ethanol %: 0.007 % (ref 0.000–0.080)
Ethanol: 109 mg/dL
Ethanol: 7 mg/dL

## 2013-12-27 LAB — COMPREHENSIVE METABOLIC PANEL
ANION GAP: 6 — AB (ref 7–16)
Albumin: 3.3 g/dL — ABNORMAL LOW (ref 3.4–5.0)
Alkaline Phosphatase: 147 U/L — ABNORMAL HIGH
BUN: 13 mg/dL (ref 7–18)
Bilirubin,Total: 0.5 mg/dL (ref 0.2–1.0)
CALCIUM: 8.7 mg/dL (ref 8.5–10.1)
CREATININE: 0.66 mg/dL (ref 0.60–1.30)
Chloride: 104 mmol/L (ref 98–107)
Co2: 27 mmol/L (ref 21–32)
EGFR (Non-African Amer.): >60
GLUCOSE: 90 mg/dL (ref 65–99)
OSMOLALITY: 273 (ref 275–301)
Potassium: 3.4 mmol/L — ABNORMAL LOW (ref 3.5–5.1)
SGOT(AST): 470 U/L — ABNORMAL HIGH (ref 15–37)
SGPT (ALT): 356 U/L — ABNORMAL HIGH (ref 12–78)
Sodium: 137 mmol/L (ref 136–145)
Total Protein: 8.4 g/dL — ABNORMAL HIGH (ref 6.4–8.2)

## 2013-12-27 LAB — URINALYSIS, COMPLETE
BILIRUBIN, UR: NEGATIVE
Glucose,UR: NEGATIVE mg/dL (ref 0–75)
KETONE: NEGATIVE
LEUKOCYTE ESTERASE: NEGATIVE
Nitrite: NEGATIVE
PH: 6 (ref 4.5–8.0)
PROTEIN: NEGATIVE
RBC,UR: 5 /HPF (ref 0–5)
Specific Gravity: 1.006 (ref 1.003–1.030)
Squamous Epithelial: 4

## 2013-12-27 LAB — CBC
HCT: 38.1 % (ref 35.0–47.0)
HGB: 12.4 g/dL (ref 12.0–16.0)
MCH: 35.7 pg — ABNORMAL HIGH (ref 26.0–34.0)
MCHC: 32.7 g/dL (ref 32.0–36.0)
MCV: 109 fL — AB (ref 80–100)
Platelet: 172 10*3/uL (ref 150–440)
RBC: 3.48 10*6/uL — AB (ref 3.80–5.20)
RDW: 13.7 % (ref 11.5–14.5)
WBC: 5.4 10*3/uL (ref 3.6–11.0)

## 2013-12-27 LAB — LIPASE, BLOOD: LIPASE: 190 U/L (ref 73–393)

## 2015-01-05 NOTE — H&P (Signed)
PATIENT NAME:  [name], [name] MR#:  [id number] DATE OF BIRTH:  Nov 27, 1965  DATE OF ADMISSION:  03/15/2013  PRIMARY CARE PHYSICIAN: Guadalupe Maple, MD  REQUESTING PHYSICIAN: Desiree Lucy. Jasmine December, MD   CHIEF COMPLAINT: Abdominal pain, nausea, vomiting.   HISTORY OF PRESENT ILLNESS: The patient is a 49 year old female with a known history of chronic pancreatitis, thyroid cancer, hypertension, is being admitted for abdominal pain, nausea and vomiting. The patient has been having abdominal pain, nausea and vomiting for about 3 days. She has been unable to keep anything down. She ran out of the Zofran at home trying to keep her pain medication, but nothing stays and she keeps throwing up. Decided to come to the Emergency Department, as she started throwing up greenish-brown stuff in the vomit and started having severe heartburn. Her last pancreatitis flare was about 8 months ago and she feels this is another pancreatitis flare. She is being admitted for further evaluation and management, as her CT scan in the Emergency Department showed edema of the duodenum and possible pancreatic mass, although this could be just a shadow from duodenum also. Considering her severe pain, she is being admitted for further evaluation and management.   PAST MEDICAL HISTORY: 1.  Pancreatitis with history of percutaneous drains x 2, last one being about a year or year and a half ago.  2.  G-tube placement x 2.  3.  History of thyroid cancer, status post thyroidectomy. 4.  Hypertension.  5.  Hepatitis C virus.  6.  Former alcohol abuse.  7.  Pancreatic pseudocyst.  8.  Chronic abdominal pain.  9.  Depression/anxiety. 10.  Anemia.  11.  History of pancytopenia.   PAST SURGICAL HISTORY: 1.  Percutaneous drains x 2 in the abdomen.  2.  Two feeding tubes in the past.  3.  Thyroidectomy.  4.  Two C-sections.   ALLERGIES: PENICILLIN.   MEDICATIONS AT HOME: 1.  Clonazepam 0.5 mg p.o. 3 times a day.  2.  Cymbalta 30 mg  p.o. daily. 3.  Gabapentin 300 mg p.o. 3 times a day.  4.  Hydroxyzine 50 mg p.o. every 8 hours as needed.  5.  Zofran 4 mg p.o. every 6 hours as needed.  6.  Oxycodone 10 mg p.o. every 6 hours as needed. 7.  Synthroid 200 mcg p.o. daily.  8.  Ventolin HFA 2 puffs inhaled 4 times a day as needed.   SOCIAL HISTORY: Smokes 1/2 to 1 pack of cigarettes daily. No alcohol use. She is a former alcohol abuser. She does smoke marijuana. She lives with her boyfriend.   FAMILY HISTORY: Mother died of metastatic cancer, unknown primary.   REVIEW OF SYSTEMS:    CONSTITUTIONAL: No fever. Positive for fatigue and weakness.  EYES: No blurry or double vision.  EARS, NOSE, THROAT: No tinnitus or ear pain.  RESPIRATORY: No cough, wheezing, hemoptysis.  CARDIOVASCULAR: No chest pain, orthopnea, edema.  GASTROINTESTINAL: Positive for nausea, vomiting and abdominal pain. No diarrhea.  GENITOURINARY: No dysuria or hematuria.  ENDOCRINE: No polyuria or nocturia. Positive for hypothyroidism. HEMATOLOGIC: Positive for anemia. No bleeding. No easy bruising. History of pancytopenia and anemia. SKIN: No rash or lesion.  MUSCULOSKELETAL: No arthritis or muscle cramp.  NEUROLOGIC: No tingling, numbness, weakness.  PSYCHIATRIC: Positive for anxiety, depression and severe fear.   PHYSICAL EXAMINATION: VITAL SIGNS: Temperature 98.4, heart rate 100 per minute, respirations 26 per minute, blood pressure 112/91 mmHg. She is saturating 100% on room air. GENERAL: The patient  is a 49 year old female lying in the bed in acute pain.  EYES: Pupils equal, round, reactive to light and accommodation. No scleral icterus. Extraocular muscles intact.  HENT: Head atraumatic, normocephalic. Oropharynx and nasopharynx clear.  NECK: Supple. No jugular venous distention. No thyroid enlargement or tenderness.  LUNGS: Clear to auscultation bilaterally. No wheezing, rales, rhonchi or crepitation.  CARDIOVASCULAR: S1, S2 normal. No  murmurs, rubs, gallops. ABDOMEN: Soft. Generalized tenderness present. No organomegaly appreciated. No rebound or guarding. Bowel sounds present.  NEUROLOGIC: Cranial nerves II through XII are intact. Muscle strength 5/5 in all extremities. Sensation intact.  PSYCHIATRIC: The patient is very anxious. Seems alert and oriented x 3. She has been crying and seems very emotional at this time.  SKIN: No obvious rash, lesion, ulcer. She does have some excoriation in the leg and scabbed wound.   LABORATORY AND RADIOLOGICAL DATA: Normal BMP, except potassium of 3.2. Normal liver function tests, except total bilirubin of 1.3. The patient's lipase was 106. AST was 67. Normal CBC, except white count of 13.1.   CT scan of the abdomen and pelvis with contrast in the ED showed edema of the duodenum. Mural mass within the duodenum, which is likely inflammation. Possible pancreatic mass or inflammatory process in the pancreatic head cannot be excluded. This could be duodenal inflammation also. Duodenal or pancreatic head process resulting in distention of the stomach. Secondary impaired gastric emptying. No acute bowel abnormality. No evidence of gallstones. No acute urinary tract or uterus or adnexal abnormality.   IMPRESSION AND PLAN:   1.  Abdominal pain, nausea and vomiting: Possibly due to poor gastric emptying from duodenal and pancreatic pressure, could have stricture. We will get a gastroenterology consult. She has had 2 percutaneous drains for pancreatic issue. Not sure if she had a pseudocyst which required drainage. She also had 2 G-tubes placed in the past, so she seems quite complex overall. Will manage her symptomatically for the time being.  2.  Hypokalemia: Will replete and recheck.  3.  History of depression and anxiety and severe crying spell with fear of ongoing issues. Will consult psychiatry. Continue her home medication at this time.  4.  Chronic pain syndrome: Will continue home medication.  Provide morphine for breakthrough pain.  5.  Tobacco abuse: She was counseled for about 3 minutes. Will provide nicotine patch 14 mg, as she is agreeable. Doubt she is ready to quit yet.  6.  Hypothyroidism: Will continue Synthroid.  CODE STATUS: Full code.   TIME SPENT: Total time taking care of this patient is 55 minutes.   ____________________________ Lucina Mellow. Manuella Ghazi, MD vss:jm D: 03/15/2013 16:53:02 ET T: 03/15/2013 19:07:15 ET JOB#: [id number]  cc: Keyston Ardolino S. Manuella Ghazi, MD, <Dictator> Guadalupe Maple, MD Lucina Mellow [facility] MD ELECTRONICALLY SIGNED 03/16/2013 16:37

## 2015-01-05 NOTE — Discharge Summary (Signed)
PATIENT NAME:  [name], [name] MR#:  [id number] DATE OF BIRTH:  1965/09/27  TRANSFER SUMMARY  DATE OF ADMISSION:  03/15/2013 DATE OF TRANSFER TO [facility]:  03/18/2013   ADMITTING DIAGNOSIS: Abdominal pain, nausea and vomiting.   DISCHARGE DIAGNOSES:  1. 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  2. History of pancreatitis, recurrent in nature.  3. Alcohol abuse.  4. Status post percutaneous drains x2 in the past.  5. Status post G-tube placement x2 in the past.  6. History of thyroid cancer, status post thyroidectomy.  7. Hypertension.  8. History of hepatitis C.  9. History of pancreatic pseudocyst.  10. Depression and anxiety.  11. Anemia.  12. History of pancytopenia.  13. Status post 2 C-sections.   CONSULTANTS: Dr. Allen Norris as well as Dr. Weber Cooks as well as Dr. Pat Patrick.   PERTINENT LABORATORIES AND EVALUATIONS: Admitting glucose 129, BUN 17, creatinine 0.74, sodium 136, potassium 3.2, chloride 96, CO2 28, calcium 10.1, bilirubin total 1.3, alkaline phosphatase 129, ALT was 53, AST was 67, total protein 9.5, lipase 106. WBC 13.1, hemoglobin 14.9, platelet count was 197. EKG: Normal sinus rhythm with right atrial enlargement. CT of the abdomen showed there is edema of the duodenum. A classic low-density pancreatic head mass is not demonstrated. The small and large bowel exhibited no evidence of obstruction. No evidence of gallstone. No acute abnormality of the urinary tracts. Her lipase was 92. Amylase was 55. Her KUB on the 3rd of July showed stomach is likely mildly distended with air-fluids. No evidence of small bowel obstruction. EGD on the 2nd showed grade A esophagitis, normal stomach, duodenal stenosis.   HOSPITAL COURSE: Please refer to H and P done by the admitting physician. The patient is a 49 year old white female with known history of chronic pancreatitis, thyroid cancer  and hypertension, who presented with abdominal pain, nausea and vomiting. The patient also was noted to have abdominal distention. She was admitted and had a CT scan of the abdomen which showed edema involving the duodenum. The patient was kept n.p.o. A consult was obtained with GI. She underwent endoscopy. The endoscopy basically showed duodenal stenosis. The patient continued to have a lot of pain and distention, had an NG tube placed without any significant improvement. She continues to have drainage through the NG tube. Because of that, surgery consult was obtained. Dr. Pat Patrick saw the patient. He felt that she might benefit from consideration of pancreectomy, but felt she was a poor candidate and recommended that she be transferred to [facility], where there is a GI specialist there that can evaluate for a pancreectomy. At this time, I have spoken to Dr. Maudie Mercury from [facility]. He has graciously accepted the patient for transfer. Also, the patient, during the hospitalization, has had agitation. She has had a previous psychiatric history, so she was seen by psychiatry.   MEDICATIONS AT THE TIME OF DISCHARGE:  1. Tylenol 650 mg q.4 p.r.n. for pain or fever.  2. Albuterol 2 puffs q.4 while awake.  3. Klonopin 0.5 t.i.d. 4. Duloxetine 30 daily.  5. Gabapentin 300 t.i.d. 6. Hydroxyzine 50 q.8 p.r.n. itching. 7. Levothyroxine 0.2 mg daily.  8. Nicotine patch 14 mg transdermally daily.  9. Zofran 4 mg IV q.4 p.r.n. 10. Roxicodone 10 mg q.6 p.r.n. 11. Protonix 40 IV q.12.  12. Hydrocodone 1 to 2 IV q.3 p.r.n. 13. Lorazepam 2 mg IV q.1 p.r.n. anxiety.   The patient also on a  CIWA protocol.   TIME SPENT: 45 minutes spent on arranging the patient's transfer and the discharge.    ____________________________ Lafonda Mosses. Posey Pronto, MD shp:OSi D: 03/18/2013 13:15:00 ET T: 03/18/2013 13:44:34 ET JOB#: [id number]  cc: Kimberl Vig H. Posey Pronto, MD, <Dictator> Alric Seton MD ELECTRONICALLY SIGNED 03/25/2013 14:46

## 2015-01-05 NOTE — Consult Note (Signed)
Brief Consult Note: Diagnosis: abdominal pain, nausea, vomiting.   Patient was seen by consultant.   Consult note dictated.   Comments: Ms. Remund is a 49 year old caucasian female with complicated GI history including chronic etoh pancreatitis complicated by pseudocysts & necrotizing disease requiring drainage followed at [facility], chronic hepatitis C without hx treatment, GOO & previous feeding tube ? j-tube.  She gives 2 day hx of abdominal pain, nausea & vomiting in setting of normal lipase.  CT shows duodenal inflammatory changes  which could be duodenitis, Duodenal ulcer or changes secondary to chronic pancreatitis.  She denies current ETOH use in past 6 mo.  Mild hypokalemia.  Plan: 1) EGD tomorrow w Dr Allen Norris 2) NPO 3) PPI BID IV 4) Agree w/ pain control, antiemetics 5) K repletion per attending  Thanks for consult.  Please see full dictated note. #[id number].  Electronic Signatures: Andria Meuse (NP)  (Signed 01-Jul-14 22:32)  Authored: Brief Consult Note   Last Updated: 01-Jul-14 22:32 by Andria Meuse (NP)

## 2015-01-05 NOTE — Consult Note (Signed)
PATIENT NAME:  [name], [name] MR#:  [id number] DATE OF BIRTH:  [birth year]  DATE OF CONSULTATION:  03/15/2013  REFERRING PHYSICIAN:  Dr. Manuella Ghazi.   CONSULTING PHYSICIAN:  Lucilla Lame, MD/Daija Routson Evalina Field, NP  PRIMARY CARE PHYSICIAN:   Dr. Jeananne Rama.    GASTROENTEROLOGIST: Dr. Allen Norris and [facility]  REASON FOR CONSULTATION: Abdominal pain, nausea and vomiting.   HISTORY OF PRESENT ILLNESS: [name] is a 49 year old Caucasian female with history of chronic pancreatitis with a complicated course. She has history of alcoholic pancreatitis that was initially diagnosed about two years ago and has been followed by [facility]. She was last seen about nine months ago. She has a history of pseudocyst and necrotizing pancreatitis requiring drainage. She also gives history of what sounds like J-tube. Four days ago, she developed severe upper abdominal pain. She had severe heartburn, nausea and vomiting. He began taking Zantac. She says her emesis "looked like a bowel movement." She is hungry and thirsty now and wants to eat and drink. She takes p.r.n. Zofran at home. She admits to daily marijuana use. She has history of heavy alcohol use, but says she quit about 6 months ago. She did have a glass of wine. Her AST is 67, total bilirubin 1.3. Lipase is normal. White blood cell count of 13.1, MCV 109. She had CT of abdomen and pelvis with contrast, which showed duodenal edema; cannot exclude a mass versus inflammation, isodense with adjacent pancreas and possibility of pancreatic mass or inflammatory process in the pancreatic head and body. The adjacent duodenum is not absolutely excluded. This process resulted distention of the stomach secondary to impaired gastric emptying. Small and large bowel are normal. The nurse, Andre Lefort, RN states that she did have about 800 mL of clear, projectile vomiting. She is requesting further pain medicine at this time.   PAST MEDICAL AND SURGICAL HISTORY: Chronic pancreatitis with history of  pseudocyst and necrotizing pancreatitis requiring drainage,  C-section x 2, chronic pain depression, anxiety, thyroid carcinoma status post surgery, she has a history of chronic hepatitis C and has not received treatment, a history of previous feeding tube possible J-tube, gastric outlet obstruction, splenic/superior mesenteric vein thrombosis.   MEDICATIONS PRIOR TO ADMISSION: Clonazepam 0.5 mg t.i.d., Cymbalta 30 mg daily, gabapentin 300 mg t.i.d., hydroxyzine 50 mg q. 8 hours p.r.n., Zofran 4 mg q. 6 hours p.r.n. oxycodone 10 mg q. 6 hours p.r.n., Synthroid 200 mcg daily, Ventolin HFA 2 puffs q.i.d. p.r.n.   ALLERGIES: PENICILLIN CAUSES HIVES.   SOCIAL HISTORY: She smokes half to 1 pack cigarettes per day. She has history of heavy alcohol use, but reports she has not drank in the past 6 months. She does use marijuana daily. She resides with her boyfriend. She is disabled.   FAMILY HISTORY: There is no known family history of colorectal carcinoma of the liver or chronic GI problems or pancreatic problems. Mother did die from metastatic cancer, unknown primary.   REVIEW OF SYSTEMS:  See history of present illness.  CONSTITUTIONAL: Low-grade fevers, malaise.  PSYCHIATRIC: She has been very tearful, crying a lot, some anxiety, depression and stress. Otherwise, negative complete review of systems.   PHYSICAL EXAMINATION: VITAL SIGNS: Temperature 97.8, pulse 68, respirations 20, blood pressure 125/74, oxygen saturation 94% on room air.  GENERAL: She is a well-developed, Caucasian female who is alert, oriented, pleasant and cooperative. She becomes tearful throughout the exam. She has her uncle at her bedside.  HEENT: Sclerae clear, anicteric. Conjunctivae pink.  NECK: Supple without  any mass or thyromegaly.  CHEST: Heart regular rate and rhythm. Normal S1, S2. No murmurs, rubs or gallops.  LUNGS: Clear to auscultation bilaterally.  ABDOMEN: Positive bowel sounds x 4. No bruits auscultated. Abdomen  is mildly distended, soft. She has significant tenderness to entire abdomen. She has rebound tenderness. No guarding. NEUROLOGIC:  Grossly intact.  PSYCHIATRIC: She is tearful, seems anxious seems depressed.  SKIN: Pink, warm and dry without any rash or jaundice.  MUSCULOSKELETAL: Good strength bilaterally.   LABORATORY, DIAGNOSTIC AND RADIOLOGIC DATA:  Potassium is 3.2, glucose 129, chloride 96, lipase 106, otherwise normal met-7, total protein 9.5,  albumin 1.3, AST 67, otherwise normal LFTs. White blood cell count of 13.1, MCV 109,  platelets 197, hemoglobin 14.9, hematocrit 43.1. Urinalysis has had specific gravity, some epithelial cells and scant red blood cells and white blood cells and 30 mg/dL of protein.   IMPRESSION: [name] is a 49 year old Caucasian female with complicated GI history including chronic alcoholic pancreatitis complicated by pseudocyst and necrotizing disease requiring drainage followed at [facility], chronic hepatitis C without history of treatment, gastric outlet obstruction and previous feeding tube possible J-tubes. She gives a history of 2 days of abdominal pain, nausea and vomiting in setting of a normal lipase. CT shows duodenal inflammatory changes, which could be duodenitis, duodenal ulcer or changes secondary to chronic pancreatitis. She denies current alcohol use in the past 6 months. She does use marijuana on a daily basis. She has a mild hypokalemia.   PLAN: 1.  Esophagogastroduodenoscopy tomorrow with Dr. Allen Norris. I discussed risks and benefits which include but are not limited to bleeding, infection, perforation, drug reaction. She agrees with plan and consent will be obtained  2.  Nothing oral.   3.  Proton pump inhibitor twice a day, IV. 4.  Agree with pain control and antiemetics.  5.  Potassium repletion per attending.   We would like to thank you for allowing [place] to participate in the care of [name].    ____________________________ Tracy Meuse,  NP klj:cc D: 03/15/2013 22:31:00 ET T: 03/15/2013 23:34:56 ET JOB#: [id number]  cc: Guadalupe Maple, MD    Tracy Meuse FNP ELECTRONICALLY SIGNED 03/23/2013 12:35

## 2015-01-05 NOTE — Consult Note (Signed)
Brief Consult Note: Diagnosis: Borderline personality,PTSD,Polysubstance dependence.   Patient was seen by consultant.   Consult note dictated.   Orders entered.   Discussed with Attending MD.   Comments: Psychiatry: Patient seen. Chart reviewed. Patient with GI stricture currently NPO and hungry and in pain. Patient Has long history of impulsivity, anger problems, self-defeating behavior. Currently very upset at being NPO. She understands the reason for it but is too emotional to control herself. Also, she missed her klonipin this am due to being npo. Also, she has a history of developing very low blood sugars and is phobic of it. I spoke to Dr Posey Pronto and he will round on her and can consider if she can be re-offered clear liquids. Even perhaps juice would make her feel better. If not, I might suggest her ivf be changed to something with dextrose in it. I have ordered stat ativan and haldol now iv and as frequent prn to help her relax. I would not worry about SA issues right now but be liberal with benzos if needed to help her calm down. Also try liberal iv haldol to help her be less agitated.  Electronic Signatures: Gonzella Lex (MD)  (Signed 04-Jul-14 12:23)  Authored: Brief Consult Note   Last Updated: 04-Jul-14 12:23 by Gonzella Lex (MD)

## 2015-01-05 NOTE — Consult Note (Signed)
CC: Duodenal stenosis.  Pt with NG suction active.  She awoke very disoriented.  She is to be transferred to [facility] today.  Chest clear, heart RRR. VSS, WBC 9.9, K 3.5, plt 144K.  Electronic Signatures: Manya Silvas (MD)  (Signed on 04-Jul-14 15:13)  Authored  Last Updated: 04-Jul-14 15:13 by Manya Silvas (MD)

## 2015-01-05 NOTE — Consult Note (Signed)
Chief Complaint:  Subjective/Chief Complaint Pt wants to eat, wants to go home.  Abdominal pain improving.  Some anxiety.  Frustrated with liquid diet.  No N/V since EGD.  NO BM since admission.   VITAL SIGNS/ANCILLARY NOTES: **Vital Signs.:   03-Jul-14 09:00  Temperature Temperature (F) 98.2  Celsius 36.7  Pulse Pulse 102  Respirations Respirations 22  Systolic BP Systolic BP 257  Diastolic BP (mmHg) Diastolic BP (mmHg) 86  Mean BP 110  Pulse Ox % Pulse Ox % 96  Pulse Ox Activity Level  At rest  Oxygen Delivery Room Air/ 21 %   Brief Assessment:  GEN well nourished, A/Ox3. NAD.   Cardiac Regular   Respiratory normal resp effort   Gastrointestinal details normal Soft  Bowel sounds normal  +faint BS, moderately distended, +TTP entire abd.   EXTR negative cyanosis/clubbing, negative edema   Additional Physical Exam Skin: Pink, warm, dry   Lab Results: Routine Chem:  03-Jul-14 06:01   Magnesium, Serum 1.9 (1.8-2.4 THERAPEUTIC RANGE: 4-7 mg/dL TOXIC: > 10 mg/dL  -----------------------)  Glucose, Serum 83  BUN 12  Creatinine (comp)  0.48  Sodium, Serum 143  Potassium, Serum  3.0  Chloride, Serum  109  CO2, Serum 27  Calcium (Total), Serum  7.9  Anion Gap 7  Osmolality (calc) 284  eGFR (African American) >60  eGFR (Non-African American) >60 (eGFR values <28m/min/1.73 m2 may be an indication of chronic kidney disease (CKD). Calculated eGFR is useful in patients with stable renal function. The eGFR calculation will not be reliable in acutely ill patients when serum creatinine is changing rapidly. It is not useful in  patients on dialysis. 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 Erosive esophagitis: On BID PPI Hypokalemia/Hypomagnesemia: Per attending   Plan 1) KUB today 2) Continue IV BID PPI 3) K/Mag repletion per attending 4) Dr EVira Agarwill follow over holiday weekend   Electronic Signatures: JAndria Meuse(NP)  (Signed 03-Jul-14 10:06)  Authored: Chief Complaint, VITAL SIGNS/ANCILLARY NOTES, Brief Assessment, Lab Results, Assessment/Plan   Last Updated: 03-Jul-14 10:06 by JAndria Meuse(NP)

## 2015-01-07 NOTE — Discharge Summary (Signed)
PATIENT NAME:  [name], [name] MR#:  [id number] DATE OF BIRTH:  1965/11/26  DATE OF ADMISSION:  11/04/2011 DATE OF DISCHARGE:  11/07/2011  ADMITTING PHYSICIAN: Loletha Grayer, MD  DISCHARGING PHYSICIAN: Gladstone Lighter, MD  PRIMARY CARE PHYSICIAN: Located in [place].   CONSULTANT: Cephus Shelling, MD - Psychiatry.  DISCHARGE DIAGNOSES:  1. Acute on chronic abdominal pain.  2. Chronic pancreatitis.  3. Chronic pain syndrome.  4. Mood disorder and posttraumatic stress disorder.  5. Passive suicidal thoughts with depression. 6. Anxiety.  7. Polysubstance abuse.  8. Tobacco use disorder.  9. Hypothyroidism.  10. Chronic intermittent diarrhea secondary to pancreatitis.   DISCHARGE MEDICATIONS:  1. Zofran 4 mg p.o. every six hours p.r.n.  2. Cymbalta 30 mg p.o. daily.  3. Klonopin 1 mg p.o. three times daily. 4. Gabapentin 300 mg p.o. three times daily. 5. OxyContin 10 mg p.o. every 12 hours.  6. Seroquel 100 mg p.o. at bedtime.  7. Roxicodone 5 mg p.o. every 6 hours p.r.n. for breakthrough pain.  8. Pancrelipase one capsule orally three times daily with meals.  9. Nicotrol inhaler every  2 hours p.r.n.   DISCHARGE LABS/STUDIES: WBC 5.4, hemoglobin 9.9, hematocrit 29.4, platelet 114.   Sodium 141, potassium 3.9, chloride 110, bicarbonate 18, BUN 6, creatinine 0.4, glucose 50, calcium 7.5. Lipase 82. Vitamin B12 543. Folic acid 21.3. Cardiac enzymes have remained negative x2 sets. LDL 37, HDL 68, total cholesterol 128, triglycerides 117.   Urinalysis: Negative for any infection.   Urine tox screen positive for cocaine, opioids, and also marijuana.   CT of abdomen and pelvis: Mild stranding of the pancreas suggestive of pancreatitis. Also there is a small pseudocyst. Persistent intrahepatic biliary ductal dilatation, left greater than right. Tiny metallic density within the sigmoid colon, possible ingested foreign body.   BRIEF HOSPITAL COURSE: [name] is a 49 year old female with  past medical history significant for chronic pancreatitis with chronic stranding around the pancreas on CT of the abdomen, chronic abdominal pain, depression and anxiety with several admissions to Behavioral Medicine unit in the past, and ongoing smoking who came to the hospital complaining of feeling extremely weak and also acute on chronic abdominal pain associated with nausea and vomiting. 1. Acute on chronic abdominal pain with history of chronic pancreatitis: There is no evidence of any acute pancreatitis, although the stranding around the pancreas seen on the CT of the abdomen is chronic. Her lipase is negative. She was kept n.p.o., but the patient has been requesting food all the while. She was eating comfortably a soft diet without any nausea or vomiting, although she has chronic pain even with eating or without eating. She was started on OxyContin p.o. twice a day for long-acting pain and Roxicodone for breakthrough pain and tapered off the IV Dilaudid, which she was getting. She was able to tolerate her diet and is being discharged to Behavior Medicine.  2. Depression and anxiety with mood disorder, passive suicidal thoughts and posttraumatic stress disorder: Please see Dr. Karleen Dolphin consultation note for more details. The patient had several tearful episodes and has expressed passive suicidal thoughts while in the hospital, so she has a sitter in the room currently and is on suicide precautions. She is on Cymbalta and Klonopin and also placed on Seroquel while in the hospital.  3. Chronic intermittent diarrhea: She is on pancreatic enzyme supplements.  4. Tobacco use disorder: She was counseled and placed on a Nicotrol inhaler.  Her course has been otherwise uneventful  in the hospital.      DISCHARGE CONDITION: Stable.   DISCHARGE DISPOSITION: Behavior Medicine unit.   TIME SPENT ON DISCHARGE: 40 minutes.  ____________________________ Gladstone Lighter, MD [id number] D: 11/07/2011  15:49:00 ET T: 11/08/2011 11:58:07 ET JOB#: [id number]  cc: Gladstone Lighter, MD, <Dictator> Gladstone Lighter MD ELECTRONICALLY SIGNED 11/14/2011 10:41
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
## 2015-01-07 NOTE — Consult Note (Signed)
Chief Complaint:   Subjective/Chief Complaint Patient seen last night. Full consult dictated. Still c/o pain which is chronic although more severe lately. Vomited once last night, according to her.  Impression: Chronic pancreatitis with chronic pain and exacerbations. Doubt acute pancreatitis.   Recommendations: Conservative management with pain control and hydration. If better, patient will follow with [facility] next week. If no significant improvement in next 48 hours will proceed with abdominal imaging.   VITAL SIGNS/ANCILLARY NOTES: **Vital Signs.:   05-Jan-13 09:33   Vital Signs Type Q 4hr   Temperature Temperature (F) 97.7   Celsius 36.5   Temperature Source oral   Pulse Pulse 81   Pulse source per Dinamap   Respirations Respirations 20   Systolic BP Systolic BP 562   Diastolic BP (mmHg) Diastolic BP (mmHg) 86   Mean BP 100   BP Source Dinamap   Pulse Ox % Pulse Ox % 98   Pulse Ox Activity Level  At rest   Oxygen Delivery Room Air/ 21 %   Routine Hem:  05-Jan-13 04:30    WBC (CBC) 4.4   RBC (CBC) 3.23   Hemoglobin (CBC) 10.4   Hematocrit (CBC) 31.2   Platelet Count (CBC) 127   MCV 97   MCH 32.2   MCHC 33.3   RDW 14.8  Routine Chem:  05-Jan-13 04:30    Glucose, Serum 80   BUN 4   Creatinine (comp) 0.65   Sodium, Serum 146   Potassium, Serum 3.7   Chloride, Serum 112   CO2, Serum 27   Calcium (Total), Serum 7.8  Hepatic:  05-Jan-13 04:30    Bilirubin, Total 0.6   Alkaline Phosphatase 92   SGPT (ALT) 27   SGOT (AST) 32   Total Protein, Serum 6.2   Albumin, Serum 2.8  Routine Chem:  05-Jan-13 04:30    Osmolality (calc) 286   eGFR (African American) >60   eGFR (Non-African American) >60   Anion Gap 7  Routine Hem:  05-Jan-13 04:30    Neutrophil % 47.6   Lymphocyte % 42.9   Monocyte % 6.2   Eosinophil % 2.5   Basophil % 0.8   Neutrophil # 2.1   Lymphocyte # 1.9   Monocyte # 0.3   Eosinophil # 0.1   Basophil # 0.0  Routine Chem:  05-Jan-13 04:30     Hemoglobin A1c ([facility]) 4.6   Electronic Signatures: Jill Side (MD)  (Signed 05-Jan-13 10:45)  Authored: Chief Complaint, VITAL SIGNS/ANCILLARY NOTES, Lab Results   Last Updated: 05-Jan-13 10:45 by Jill Side (MD)

## 2015-01-07 NOTE — H&P (Signed)
PATIENT NAME:  [name], [name] MR#:  [id number] DATE OF BIRTH:  1965/12/22  DATE OF ADMISSION:  11/04/2011  PRIMARY CARE PHYSICIAN: Dr. Unknown Foley in [place]: Abdominal pain.   HISTORY OF PRESENT ILLNESS: This is a 49 year old female with recurrent pancreatitis, an extensive past medical history coming in with abdominal pain and vomiting. She states that the vomiting started three days ago. Her throat is very raw. She is unable to keep anything down. This morning she did have some blood from her bottom, bright red blood in the bowl, which scared her and she got panicked. She is also having this left-sided abdominal pain that has been out of control for the last few days. Sometimes she does not have pain in her abdomen and she does have bouts of pain and is usually able to handle the pain with oxycodone but this pain has been out of control 10 out of 10 in intensity radiating around to her left back. It is described as knifelike and twisting. She has also been very weak and has been having falls and she has a right chest bruise from a fall two days ago. She has been crying in front of me during the history. Friend asked if we can place her in a facility where she can build up her nutrition. In the ER, she received a few doses of IV pain medications. She had a CT scan of the abdomen that showed a pancreatitis although her lipase was normal range. Hospitalist services were contacted for further evaluation.   PAST MEDICAL HISTORY:  1. Pancreatitis with history of percutaneous drains x2.  2. G-tube placement x2.  3. History of thyroid cancer, status post thyroidectomy.  4. Hypertension.  5. HCV. 6. Former alcohol abuse. 7. Pancreatic pseudocyst. 8. Chronic abdominal pain.  9. Depression/anxiety. 10. Anemia.  11. History of pancytopenia.    PAST SURGICAL HISTORY:  1. Two drains. 2. Two feeding tubes. 3. Thyroidectomy. 4. Two Cesarean sections.   ALLERGIES: Allergies in the  computer as penicillin and morphine.   MEDICATIONS:  1. Pancrelipase 2 tablets with meals, 1 tablet with snack. 2. Klonopin 1 mg t.i.d.  3. Oxycodone 5 mg q.4 to 6 hours.  4. Gabapentin liquid 250 mg t.i.d.  5. Synthroid 200 mcg daily.  6. Cymbalta 30 mg daily.  7. Nicotrol inhaler p.r.n.  8. Sonata nightly.   SOCIAL HISTORY: Smokes six cigarettes per day. No alcohol but former alcohol abuse. Her last drink was Christmas Eve. Does smoke marijuana. Lives with her boyfriend who is rarely there.   FAMILY HISTORY: Mother died of metastatic cancer, it was to the bone, unknown primary. Father unknown.   REVIEW OF SYSTEMS: CONSTITUTIONAL: Positive for chills. Positive for sweats. No fever. Positive for weight loss from 146 down to 70 pounds, now back up to 97 pounds. Positive for weakness. EYES: She does wear glasses. EARS, NOSE, MOUTH, AND THROAT: Positive for runny nose. Positive for sore throat. Positive for hoarse voice. CARDIOVASCULAR: Positive for chest pain today radiating to her right arm. RESPIRATORY: Positive for shortness of breath. No cough. No sputum. No hemoptysis. GASTROINTESTINAL: Positive for nausea. Positive for vomiting. Positive for abdominal pain. No hematemesis. Positive for bright red blood per rectum. No diarrhea. No constipation. GENITOURINARY: No burning on urination. No hematuria. MUSCULOSKELETAL: No joint pain or muscle pain. INTEGUMENTARY: Positive for itching. PSYCHIATRIC: Positive for anxiety, depression, and severe fear. NEUROLOGICAL: History of seizure; fall the other day. ENDOCRINE: Positive for hypothyroidism. HEMATOLOGIC/LYMPHATIC:  History of pancytopenia.    PHYSICAL EXAMINATION:   CURRENT VITAL SIGNS: Temperature 98, pulse 64, respirations 20, blood pressure 145/93, pulse oximetry 96%.   GENERAL: No respiratory distress, sitting up in bed crying in front of me.   EYES: Conjunctivae and lids normal. Pupils equal, round, and reactive to light. Extraocular muscles  intact. No nystagmus.   EARS, NOSE, MOUTH, AND THROAT: Tympanic membrane no erythema. Nasal mucosa no erythema. Throat no erythema. No exudate seen. Lips and gums no lesions.   NECK: No JVD. No bruits. No lymphadenopathy. No thyromegaly. No thyroid nodules palpated.   RESPIRATORY: Lungs clear to auscultation. No use of accessory muscles to breathe. No rhonchi, rales, or wheeze heard.   CARDIOVASCULAR: S1, S2 normal. Positive carotid upstroke 2+ bilaterally. Dorsalis pedis pulses 2+ bilaterally. No edema. No murmurs, rubs, or gallops heard.   ABDOMEN: Soft. Positive tenderness in the epigastric and left upper quadrant. No masses felt.   LYMPHATIC: No lymph nodes in the neck.   MUSCULOSKELETAL: No clubbing, edema, or cyanosis.   SKIN: Secondary excoriations on the legs, scabbing. No signs of infection.   NEUROLOGIC: Cranial nerves II through XII grossly intact. Deep tendon reflexes 2+ bilateral lower extremities.   PSYCHIATRIC: The patient is oriented to person, place, and time.   ASSESSMENT AND PLAN:  1. Acute on chronic pancreatitis with history of pseudocyst. Lipase normal but CT scan showing inflammation of the pancreas. Will admit to the hospital. Give IV Dilaudid. P.r.n. oral oxycodone. N.p.o. Vigorous IV fluid hydration. Continue to monitor closely. This could be exacerbated by her severe anxiety and fear that something bad is going on.  2. Severe anxiety and fear. The patient is on Klonopin and Cymbalta. Will also ask a psychiatric evaluation. Part of her issues can be mental rather than physical.  3. Chronic pain syndrome. On oxycodone as outpatient.  4. Hypothyroidism. Continue Synthroid.  5. Tobacco abuse. Smoking cessation counseling done, three minutes by me. Nicotrol inhaler.  6. Chest pain, doubt this is cardiac. Will put on off-unit telemetry. Get serial cardiac enzymes. Get a stat EKG done on admission.  7. Bright red blood per rectum. Will monitor serial hemoglobins. Can  consult GI if continues. Will put on IV Protonix for right now.   TIME SPENT ON ADMISSION: 55 minutes.   ____________________________ Tana Conch. Leslye Peer, MD [id number] D: 11/04/2011 18:02:49 ET T: 11/05/2011 05:51:20 ET JOB#: [id number]  cc: Tana Conch. Leslye Peer, MD, <Dictator> Dr. Unknown Foley in [place] SIGNED 11/07/2011 16:01

## 2015-01-07 NOTE — Consult Note (Signed)
   Electronic Signatures: Jill Side (MD)  (Signed 04-Jan-13 16:42)  Authored: Brief Consult Note   Last Updated: 04-Jan-13 16:42 by Jill Side (MD)
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
## 2015-01-07 NOTE — H&P (Signed)
PATIENT NAME:  [name], [name] MR#:  [id number] DATE OF BIRTH:  05-20-1966  DATE OF ADMISSION:  09/19/2011  REFERRING PHYSICIAN:   Graciella Freer, MD    PRIMARY CARE PHYSICIAN: Golden Pop, MD   PRIMARY GASTROLOGIST: Dr. 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 The patient states she woke around 2:00 a.m. earlier today out of her sleep secondary to severe abdominal pain which was greater than 10 out of 10 in intensity, associated with nausea and multiple episodes of vomiting (at least five episodes of nonbloody brownish-colored emesis). Her abdominal pain was located in the periumbilical and epigastric region. Pain was worse with moving around and better with lying still and pain medications at home. She usually responds somewhat to Percocet; however, today she has been unable to keep Percocet down due to vomiting, and her abdominal pain persisted, and she called EMS and was brought to the ER for further evaluation. She denies fevers or chills. She was given IV pain medication in the ER, and her abdominal pain has improved to a level of 7 out of 10. Her nausea has improved with antiemetics, and she has not vomited in the ER. An abdominal x-ray was obtained which reveals a partially distended stomach with fluid, but no definite acute changes are identified. No dilated bowel loops were noted, and there was no  suspicion for bowel obstruction. Serum lipase is 75. AST is minimally elevated at 43 with normal total bilirubin, alkaline phosphatase and ALT levels. Albumin level is normal as well. Otherwise, the patient is without specific complaints at this time. Hospitalist Services were contacted for further evaluation and for hospital admission.   PAST MEDICAL/SURGICAL HISTORY:  1. History of G-tube placement, status post removal.  2. History of percutaneous drain placement for necrotizing pancreatitis.  3. Hypertension. 4. History of thyroid cancer, status post thyroidectomy and resultant hypothyroidism. 5. HCV.  6. History of former alcohol abuse.  7. Chronic pancreatitis secondary to history of alcoholism.  8. Necrotizing pancreatitis, status post percutaneous drain placement.  9. History of pancreatic pseudocyst being conservatively managed, followed by [facility], Dr. Thurston Pounds, with multiple hospitalizations in the past at  [facility] and [facility] for chronic abdominal pain due to chronic pancreatitis and recent admission from 12/6 to 08/25/2011 for a pancreatitis flare.  10. Chronic abdominal pain.  11. Chronic pain syndrome.  12. Thyroidectomy. 13. Cesarean section.   14. Depression/anxiety. 15. Questionable diabetes. 16. Iron deficiency anemia.  17. History of pancytopenia which is felt to be dilutional secondary to IV fluids.  18. History of increased liver function tests.  19. History of hypokalemia.  20. History of hyponatremia.  21. History of gastric outlet obstruction.  22. History of splenic and superior vein thrombosis. The patient is not on anticoagulation therapy at baseline.  23. History of former alcohol abuse.  24. History of tobacco abuse, ongoing.   ALLERGIES: Morphine and penicillin.   HOME MEDICATIONS:  The patient is not aware of all of her medications and doses but thinks she is taking the same medications that she  was on from the most recent discharge from 08/25/2011.  Therefore, the following medications were obtained from the most recent Discharge Summary from 08/25/2011.  1. Zofran 4 mg p.o. every 6 hours p.r.n. nausea.  2. Oxycodone 5 mg, 1 to 2 tablets p.o. every 2 to 4 hours p.r.n.   3. Baclofen 10 mg q.i.d.  4. Zantac 150 mg b.i.d.  5. Pancrelipase 12,000 units t.i.d. with meals.  6. Phenergan 25 mg p.o. every 6 hours p.r.n.  7. Nicotrol patch daily.  8. Klonopin 1 mg p.o. every 4 hours p.r.n.  9. Unknown blood pressure pill.  10. Cymbalta 30 mg daily.  11. Synthroid 200 mcg daily.   FAMILY HISTORY: Mother died of unknown cancer. Father's history is unknown. Family history is positive for diabetes mellitus.   SOCIAL HISTORY: Tobacco: Ongoing, currently smokes 1/2 pack per day and has been smoking for approximately 30 years. Alcohol:  Former heavy abuse. She drank heavily for about 3 to 4 years. She quit drinking about a year ago when she was diagnosed with necrotizing pancreatitis. Illicit drugs: None. The patient lives in [place], [place] at home with her boyfriend. She has one daughter.   REVIEW OF SYSTEMS: CONSTITUTIONAL: Reports abdominal pain, nausea, and vomiting. Denies fevers, chills, or weakness. Has had an approximately 4 to 5-pound weight loss over the past couple of months. HEAD/EYES: Denies headache or blurry vision. ENT: Denies tinnitus, earache, nasal discharge, or sore throat. RESPIRATORY: Denies shortness breath, cough, or wheezing. CARDIOVASCULAR: Denies chest pain, heart palpitations, lower extremity edema. GI: Reports abdominal pain, nausea, vomiting. Denies diarrhea, constipation, melena, hematochezia. Denies hematemesis. GENITOURINARY: Denies dysuria or hematuria. ENDOCRINE: Denies heat or cold intolerance. HEMATOLOGIC/LYMPHATIC: Denies easy bruising or bleeding. INTEGUMENTARY: Denies rash. MUSCULOSKELETAL: Denies joint pain or muscle weakness currently. NEUROLOGICAL: Denies headache, numbness, weakness, tingling, or dysarthria.  PSYCHIATRIC: Has underlying depression and anxiety.  PHYSICAL EXAMINATION:  VITAL SIGNS:  Temperature 97.3, pulse 80, blood pressure 158/99, respirations 16, oxygen saturation 97% on room air.   GENERAL: The patient is thin, cachectic-appearing female, alert and oriented, in mild distress secondary to pain.    HEENT: Normocephalic, atraumatic. Pupils are equal, round and reactive to light accommodation. Extraocular muscles are intact. Anicteric sclerae. Conjunctivae pink. Hearing intact to voice. Nares without drainage. Oral mucosa is slightly dry but without any lesions.   NECK: Supple with full range of motion. No JVD, lymphadenopathy, or carotid bruits bilaterally. Trachea is midline. She status post thyroidectomy.   CHEST: Normal respiratory effort without use of accessory respiratory muscles. Lungs are clear to auscultation bilaterally without crackles, rales, or wheezes.   CARDIOVASCULAR: S1, S2 positive. Regular rate and rhythm. No murmurs, rubs or gallops. PMI is nonlateralized.   ABDOMEN: Soft, nondistended. She has got mild-to-moderate tenderness to palpation in the epigastrium as well as periumbilical region without rebound or guarding. No hepatosplenomegaly or palpable masses. No hernias. She has got hypoactive bowel sounds throughout.   EXTREMITIES: No clubbing, cyanosis, or edema. Pedal pulses are palpable bilaterally.   SKIN: No suspicious rashes. Skin turgor is poor.   LYMPH: No cervical lymphadenopathy.   NEUROLOGICAL:   Alert and oriented x3. Cranial nerves II through XII are grossly intact. No focal deficits.   PSYCHIATRIC: A pleasant female with the appropriate affect.    LABORATORY, DIAGNOSTIC AND RADIOLOGICAL DATA:  Abdominal x-ray: No definite acute changes are identified. The stomach is partially following distended with fluid.  Urine pregnancy test is negative.  CBC: WBC 7.7, hemoglobin 11.9, hematocrit 35.1,  platelets 165, lipase 75.  Serum magnesium 1.9.   Complete metabolic panel: Sodium 242, potassium 3.3, chloride 107, bicarbonate 23, BUN 9, creatinine 0.61, glucose 128, calcium 8.4, anion gap 10, total protein 8, albumin 3.6, total bilirubin 0.5, AST 48, ALT 41, alkaline phosphatase 111.    ASSESSMENT AND PLAN: A 49 year old female with past medical history of former alcohol abuse, HCV, chronic pancreatitis, history of pancreatic pseudocyst being conservatively managed, history of necrotizing pancreatitis status post percutaneous drain placement, history of gastric outlet obstruction, tobacco abuse, hypertension, thyroid cancer status post thyroidectomy with resultant hypothyroidism, depression, anxiety, iron deficiency anemia, questionable  diabetes, here with abdominal pain, nausea and vomiting.   1. Abdominal pain, nausea, vomiting: I suspect from gastric outlet obstruction as well as chronic pancreatitis and will admit the patient to a Med/Surg Unit with off-unit telemetry. The patient does not want to stay n.p.o. for now. She wants to attempt a trial of clear liquid diet, which we will order. We will also give IV fluids for hydration and provide pain control and antiemetics. Continue pancrelipase supplement. Obtain GI consultation for further recommendations. The patient has a history of pseudocyst and necrotizing pancreatitis but wishes to hold off on abdominal imaging at this time, including CT and MRI, as she is concerned about excess radiation exposure due to multiple imaging studies performed during 2012; but she states she will be willing to consider imaging if her condition does not improve with conservative measures or worsens. Await GI evaluation. Further work-up and management to follow depending on the patient's clinical course.  2. Chronic pancreatitis with history of pseudocyst: As above.  3. Gastric outlet obstruction: Consider NG tube if vomiting persists, but currently there is no obstruction noted on x-ray, and she wishes to attempt a  trial of clear liquid diet. We will perform serial abdominal exams. We will also provide pain control. GI consultation for further recommendations. We will also keep her on PPI therapy.  4. Hypertension: The patient is unaware of home blood pressure medication. We will write for p.r.n. IV labetalol for now and monitor blood pressure closely.  5. Depression/anxiety: Continue Cymbalta and p.r.n. Klonopin.  6. Hypothyroidism: Continue Synthroid. Check TSH and adjust accordingly.  7. History of questionable diabetes mellitus: Serum glucose is 128. Check hemoglobin A1c and obtain frequent Accu-Cheks; and if her blood sugars become significantly elevated, we will keep her on sliding scale insulin.  8. Tobacco abuse: The patient was strongly counseled on the importance of smoking cessation, approximately 3 minutes were spent in doing so. We will place nicotine patch per patient's desire.  9. History of HCV with elevated AST: Conservative management for now and continue to follow, also check AST. If her LFTs worsen, would consider abdominal ultrasound, but GI will be following the patient and await further recommendations.  10. History of iron deficiency anemia: Her hematocrit is currently normal. We will continue to monitor.  11. Deep venous thrombosis prophylaxis: SCDs and TEDs.   CODE STATUS:  FULL CODE.       TIME SPENT ON ADMISSION: Approximately 50 minutes.  ____________________________ Romie Jumper, MD [id number]:[id number] D: 09/19/2011 15:50:47 ET T: 09/19/2011 16:43:41 ET JOB#: [id number]  cc: Romie Jumper, MD, <Dictator> Guadalupe Maple, MD Dr. Thurston Pounds, [facility]  Romie Jumper MD ELECTRONICALLY SIGNED 10/02/2011 18:56

## 2015-01-07 NOTE — Discharge Summary (Signed)
PATIENT NAME:  [name], [name] MR#:  [id number] DATE OF BIRTH:  [birth year]  DATE OF ADMISSION:  09/19/2011 DATE OF DISCHARGE:  09/22/2011  For a detailed note, please take a look at of the history and physical done by Dr. Dagoberto Ligas on admission.   DIAGNOSES AT DISCHARGE:  1. Acute on chronic pancreatitis.  2. Chronic pain syndrome. 3. High drug-seeking behavior. 4. History of pancreatic pseudocyst. 5. Anxiety.  6. Hypothyroidism.   DIET: The patient was discharged on a low fat diet.   ACTIVITY: As tolerated.   FOLLOW-UP: Follow-up is with Dr. Golden Pop in the next 1 to 2 weeks. Also follow-up with [facility] in the next few days. The patient has an appointment on 09/25/2011.   DISCHARGE MEDICATIONS:  1. Zofran 4 mg every six hours p.r.n. nausea, vomiting.  2. Synthroid 200 mcg daily.  3. Baclofen 10 mg q.i.d.  4. Zantac 150 mg b.i.d.  5. Pancreatic lipase 1 tab t.i.d. with meals.  6. Cymbalta 30 mg daily.  7. Oxycodone 5 mg q.4-6h. p.r.n. pain. 8. Klonopin 1 mg q.6h. p.r.n. anxiety.   CONSULTANTS: Dr. Dionne Milo from gastroenterology.   BRIEF HOSPITAL COURSE: This is a 49 year old female with medical problems as mentioned above who presented to the hospital on 09/19/2011 with symptoms of abdominal pain, nausea, and vomiting.   1. Abdominal pain, nausea, and vomiting. This was likely thought to be secondary to a relapse of her pancreatitis. The patient has a history of previous chronic pancreatitis with pseudocyst. She was managed conservatively with IV fluids, antiemetics, and pain control. A gastroenterology consult was obtained. The patient actually did not want any further imaging as she has had multiple radiologic studies with no evidence of any acute pancreatitis. With supportive care the patient's symptoms did slightly improve. She was able to tolerate to a low-fat diet and therefore discharged on some pancreatic supplements and some oxycodone for pain. The patient actually  follows up with [facility] and has a follow-up appointment coming up in the next three days and will follow-up with them.  2. Chronic pain syndrome. The patient does have high drug-seeking behavior. She continued to ask for Dilaudid every 4 to 6 hours during the hospital course. She likely would benefit from an outpatient pain management evaluation.  3. Chronic pancreatitis. As mentioned, the patient was managed supportively with pain control, antiemetics, and IV fluids. She will resume her pancreatic supplements upon discharge.  4. Hypothyroidism. The patient was maintained on Synthroid and she will resume that.  5. Anxiety. The patient was maintained on some Klonopin and she was discharged on Klonopin.   CODE STATUS: The patient is a FULL CODE.   TIME SPENT: 40 minutes.    ____________________________ Belia Heman. Verdell Carmine, MD [id number] D: 09/22/2011 16:26:49 ET T: 09/23/2011 16:37:19 ET JOB#: [id number]  cc: Belia Heman. Verdell Carmine, MD, <Dictator> Guadalupe Maple, MD Henreitta Leber MD ELECTRONICALLY SIGNED 09/24/2011 16:07

## 2015-01-07 NOTE — Consult Note (Signed)
PATIENT NAME:  [name], OKI MR#:  [id number] DATE OF BIRTH:  1966/07/20  DATE OF CONSULTATION:  09/19/2011  REFERRING PHYSICIAN:  Dagoberto Ligas, MD  CONSULTING PHYSICIAN:  Jill Side, MD  PRIMARY CARE PHYSICIAN:  Golden Pop, MD  REASON FOR CONSULTATION: Vomiting and abdominal pain.   HISTORY OF PRESENT ILLNESS: This is a 49 year old female with quite extensive and complicated past medical history. Apparently the patient has had a history of chronic recurrent pancreatitis for about a year. The patient has had multiple admissions to [facility] with pancreatitis, pancreatic pseudocyst, and history of necrotizing pancreatitis requiring drainage. According to the patient, during all those episodes she had lost a significant amount of weight and underwent a feeding tube placement, which was later removed. The patient used to be a heavy drinker and apparently she was told that her pancreatitis was related to her alcohol drinking. According to the patient, she has not been drinking lately but she presented to the Emergency Room with a few hours' history of severe abdominal pain that did not respond to her p.o. pain medicines at home. The patient does have history of chronic pain syndrome and has been on narcotics at home. The patient's lipase was normal on admission. The patient did not want to have a CT scan due to the risk of radiation. Liver enzymes are quite unremarkable; AST was only mildly elevated. The pain was 10/10 according to the patient on arrival, which got better after some IV pain medicines. She is complaining of some nausea and vomiting within the last day or so. Abdominal x-rays showed somewhat distended stomach, but were otherwise unremarkable. According to the patient, this episode of abdominal pain is very similar to her episodes of pancreatitis in the past.   PAST MEDICAL HISTORY:  1. History of G-tube placement, status post removal, that was done because of significant weight  loss during these episodes of pancreatitis.  2. History of recurrent pancreatitis. The first episode was about a year ago according to the patient. History of a percutaneous drain placement most likely for drainage of pseudocyst.  3. History of thyroid cancer, status post thyroidectomy.  4. History of hepatitis C, apparently she has never been treated before.  5. History of ethanol abuse in the past.  6. Probable chronic pancreatitis.  7. Chronic abdominal pain and chronic pain syndrome.  8. History of depression and anxiety.  9. Iron deficiency anemia.  10. History of gastric outlet obstruction.  11. 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   ALLERGIES: Morphine and penicillin.   HOME MEDICATIONS:  1. Zofran.  2. Oxycodone.  3. Baclofen.  4. Zantac.  5. Pancrelipase.  6. Phenergan.  7. Nicotrol.  8. Klonopin.  9. Cymbalta. 10. Synthroid.   FAMILY HISTORY: Mother died of unknown cancer.   SOCIAL HISTORY: She smokes about 1/2 pack a day, history of a heavy alcohol drinking but according to her she has not had a drink for the last year or so.   REVIEW OF SYSTEMS: Negative except for what is mentioned in the History of Present Illness.   PHYSICAL EXAMINATION:  GENERAL: Cachectic, poorly built female. She does not appear to be in any acute distress at this point. Clinically she does not appear to be jaundiced or anemic.   VITAL SIGNS: Temperature 98.1, respirations 20, blood pressure 156/70, and heart rate is in the 70s.   NECK:  Neck veins are flat.   LUNGS: Grossly clear to  auscultation bilaterally with fair air entry and no added sounds.   CARDIOVASCULAR: Regular rate and rhythm. No gallops or murmur.   ABDOMEN: Flat. Bowel sounds are positive. Tenderness was noted in the epigastric and left upper quadrant area. There is no rebound. No hepatosplenomegaly was noted.   NEUROLOGIC: She is fully awake, alert, and oriented and  neurological examination appears to be unremarkable.   LABORATORY, DIAGNOSTIC, AND RADIOLOGICAL DATA: TSH is normal. Liver enzymes are unremarkable. Serum lipase is only 75, white cell count is 7.7, hemoglobin 12, hematocrit 35. Urinalysis is unremarkable. Urine pregnancy test is negative. Abdominal x-rays are unremarkable. CT scans done in the past as well as ultrasounds have shown some amount of pericholecystic fluid and thickened gallbladder wall as well as mild dilatation of intrahepatic and extrahepatic biliary tree. Liver enzymes have been normal in the past as well.   ASSESSMENT AND PLAN: The patient is with recurrent pancreatitis most likely secondary to ethanol abuse, although the patient has not been drinking lately according to her, and still presented with another attack of abdominal pain. Her lipase is negative and maybe we are dealing with a case of chronic pancreatitis with frequent flare-ups rather than recurrent acute pancreatitis. The patient's abdominal examination is quite benign and she does not appear to be toxic. I agree with conservative management with IV hydration, pain control, and IV pain medicines, control of nausea and vomiting, and clear liquid diet. The patient will require further more consistent followup at [facility] for further management of her pancreatitis and apparently has an appointment next week. If the patient's abdominal pain does not improve in the next 24 to 48 hours, then I would recommend further imaging including CT scan of the abdomen and pelvis for further evaluation. The plan has been discussed with Dr. Holley Raring. We will follow. Thank you so much, Dr. Holley Raring.  ____________________________ Jill Side, MD [id number]:bjt D: 09/19/2011 21:06:36 ET T: 09/20/2011 11:49:38 ET JOB#: [id number]  cc: Jill Side, MD, <Dictator> Guadalupe Maple, MD Jill Side MD ELECTRONICALLY SIGNED 09/20/2011 14:37
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
## 2015-03-05 ENCOUNTER — Telehealth: Payer: Self-pay | Admitting: Unknown Physician Specialty

## 2015-03-05 MED ORDER — DULOXETINE HCL 60 MG PO CPEP: 60.0000 mg | ORAL_CAPSULE | Freq: Every day | ORAL | Status: DC

## 2015-03-05 NOTE — Telephone Encounter (Signed)
E-Fax came through for refill: Rx: Duloxetine  Rx in basket

## 2015-03-18 ENCOUNTER — Encounter: Payer: Self-pay | Admitting: Emergency Medicine

## 2015-03-18 ENCOUNTER — Emergency Department: Payer: Self-pay

## 2015-03-18 ENCOUNTER — Inpatient Hospital Stay
Admission: EM | Admit: 2015-03-18 | Discharge: 2015-03-21 | DRG: 438 | Disposition: A | Discharge status: Other Acute Inpatient Hospital | Payer: Self-pay | Attending: Internal Medicine | Admitting: Internal Medicine

## 2015-03-18 DIAGNOSIS — Z880 Allergy status to penicillin: Secondary | ICD-10-CM

## 2015-03-18 DIAGNOSIS — Z8261 Family history of arthritis: Secondary | ICD-10-CM

## 2015-03-18 DIAGNOSIS — Z452 Encounter for adjustment and management of vascular access device: Secondary | ICD-10-CM

## 2015-03-18 DIAGNOSIS — B1920 Unspecified viral hepatitis C without hepatic coma: Secondary | ICD-10-CM | POA: Diagnosis present

## 2015-03-18 DIAGNOSIS — K861 Other chronic pancreatitis: Secondary | ICD-10-CM | POA: Diagnosis present

## 2015-03-18 DIAGNOSIS — R569 Unspecified convulsions: Secondary | ICD-10-CM | POA: Diagnosis present

## 2015-03-18 DIAGNOSIS — R1013 Epigastric pain: Secondary | ICD-10-CM | POA: Diagnosis present

## 2015-03-18 DIAGNOSIS — F329 Major depressive disorder, single episode, unspecified: Secondary | ICD-10-CM | POA: Diagnosis present

## 2015-03-18 DIAGNOSIS — Z800 Family history of malignant neoplasm of digestive organs: Secondary | ICD-10-CM

## 2015-03-18 DIAGNOSIS — E876 Hypokalemia: Secondary | ICD-10-CM | POA: Diagnosis present

## 2015-03-18 DIAGNOSIS — Y907 Blood alcohol level of 200-239 mg/100 ml: Secondary | ICD-10-CM | POA: Diagnosis present

## 2015-03-18 DIAGNOSIS — K311 Adult hypertrophic pyloric stenosis: Secondary | ICD-10-CM | POA: Diagnosis present

## 2015-03-18 DIAGNOSIS — F10239 Alcohol dependence with withdrawal, unspecified: Secondary | ICD-10-CM | POA: Diagnosis present

## 2015-03-18 DIAGNOSIS — K315 Obstruction of duodenum: Secondary | ICD-10-CM | POA: Diagnosis present

## 2015-03-18 DIAGNOSIS — Z8249 Family history of ischemic heart disease and other diseases of the circulatory system: Secondary | ICD-10-CM

## 2015-03-18 DIAGNOSIS — F17210 Nicotine dependence, cigarettes, uncomplicated: Secondary | ICD-10-CM | POA: Diagnosis present

## 2015-03-18 DIAGNOSIS — E039 Hypothyroidism, unspecified: Secondary | ICD-10-CM | POA: Diagnosis present

## 2015-03-18 DIAGNOSIS — Z833 Family history of diabetes mellitus: Secondary | ICD-10-CM

## 2015-03-18 DIAGNOSIS — F1020 Alcohol dependence, uncomplicated: Secondary | ICD-10-CM | POA: Diagnosis present

## 2015-03-18 DIAGNOSIS — K831 Obstruction of bile duct: Secondary | ICD-10-CM | POA: Diagnosis present

## 2015-03-18 DIAGNOSIS — K863 Pseudocyst of pancreas: Secondary | ICD-10-CM

## 2015-03-18 DIAGNOSIS — F1010 Alcohol abuse, uncomplicated: Secondary | ICD-10-CM | POA: Diagnosis present

## 2015-03-18 DIAGNOSIS — K860 Alcohol-induced chronic pancreatitis: Secondary | ICD-10-CM | POA: Diagnosis present

## 2015-03-18 DIAGNOSIS — J45909 Unspecified asthma, uncomplicated: Secondary | ICD-10-CM | POA: Diagnosis present

## 2015-03-18 DIAGNOSIS — K852 Alcohol induced acute pancreatitis: Principal | ICD-10-CM | POA: Diagnosis present

## 2015-03-18 DIAGNOSIS — J449 Chronic obstructive pulmonary disease, unspecified: Secondary | ICD-10-CM | POA: Diagnosis present

## 2015-03-18 DIAGNOSIS — Z79899 Other long term (current) drug therapy: Secondary | ICD-10-CM

## 2015-03-18 DIAGNOSIS — F419 Anxiety disorder, unspecified: Secondary | ICD-10-CM | POA: Diagnosis present

## 2015-03-18 DIAGNOSIS — I10 Essential (primary) hypertension: Secondary | ICD-10-CM | POA: Diagnosis present

## 2015-03-18 HISTORY — DX: Personal history of other specified conditions: Z87.898

## 2015-03-18 HISTORY — DX: Unspecified viral hepatitis C without hepatic coma: B19.20

## 2015-03-18 HISTORY — DX: Unspecified asthma, uncomplicated: J45.909

## 2015-03-18 HISTORY — DX: Alcohol abuse, uncomplicated: F10.10

## 2015-03-18 HISTORY — DX: Other psychoactive substance use, unspecified, in remission: F19.91

## 2015-03-18 HISTORY — DX: Other chronic pancreatitis: K86.1

## 2015-03-18 HISTORY — DX: Hypothyroidism, unspecified: E03.9

## 2015-03-18 HISTORY — DX: Chronic obstructive pulmonary disease, unspecified: J44.9

## 2015-03-18 HISTORY — DX: Major depressive disorder, single episode, unspecified: F32.9

## 2015-03-18 HISTORY — DX: Depression, unspecified: F32.A

## 2015-03-18 HISTORY — DX: Pseudocyst of pancreas: K86.3

## 2015-03-18 LAB — LIPASE, BLOOD: LIPASE: 60 U/L — AB (ref 22–51)

## 2015-03-18 LAB — CBC WITH DIFFERENTIAL/PLATELET
BASOS ABS: 0.1 10*3/uL (ref 0–0.1)
Basophils Relative: 1 %
EOS PCT: 1 %
Eosinophils Absolute: 0 10*3/uL (ref 0–0.7)
HCT: 41.4 % (ref 35.0–47.0)
Hemoglobin: 13.9 g/dL (ref 12.0–16.0)
LYMPHS PCT: 26 %
Lymphs Abs: 2.2 10*3/uL (ref 1.0–3.6)
MCH: 35.6 pg — ABNORMAL HIGH (ref 26.0–34.0)
MCHC: 33.6 g/dL (ref 32.0–36.0)
MCV: 106 fL — AB (ref 80.0–100.0)
MONO ABS: 0.5 10*3/uL (ref 0.2–0.9)
Monocytes Relative: 6 %
NEUTROS PCT: 68 %
Neutro Abs: 5.8 10*3/uL (ref 1.4–6.5)
Platelets: 220 10*3/uL (ref 150–440)
RBC: 3.9 MIL/uL (ref 3.80–5.20)
RDW: 13.3 % (ref 11.5–14.5)
WBC: 8.6 10*3/uL (ref 3.6–11.0)

## 2015-03-18 LAB — COMPREHENSIVE METABOLIC PANEL
ALBUMIN: 4.1 g/dL (ref 3.5–5.0)
ALT: 30 U/L (ref 14–54)
AST: 60 U/L — ABNORMAL HIGH (ref 15–41)
Alkaline Phosphatase: 105 U/L (ref 38–126)
Anion gap: 11 (ref 5–15)
BILIRUBIN TOTAL: 0.8 mg/dL (ref 0.3–1.2)
BUN: 14 mg/dL (ref 6–20)
CO2: 21 mmol/L — ABNORMAL LOW (ref 22–32)
CREATININE: 0.64 mg/dL (ref 0.44–1.00)
Calcium: 8.2 mg/dL — ABNORMAL LOW (ref 8.9–10.3)
Chloride: 100 mmol/L — ABNORMAL LOW (ref 101–111)
GFR calc Af Amer: >60 mL/min (ref >60)
GLUCOSE: 98 mg/dL (ref 65–99)
Potassium: 4.8 mmol/L (ref 3.5–5.1)
Sodium: 132 mmol/L — ABNORMAL LOW (ref 135–145)
TOTAL PROTEIN: 9.1 g/dL — AB (ref 6.5–8.1)

## 2015-03-18 LAB — ETHANOL: Alcohol, Ethyl (B): 239 mg/dL — ABNORMAL HIGH (ref <5)

## 2015-03-18 LAB — HCG, QUANTITATIVE, PREGNANCY: hCG, Beta Chain, Quant, S: 1 m[IU]/mL (ref <5)

## 2015-03-18 MED ORDER — NICOTINE 10 MG IN INHA
RESPIRATORY_TRACT | Status: AC
  Administered 2015-03-18: 1 via RESPIRATORY_TRACT
  Filled 2015-03-18: qty 36

## 2015-03-18 MED ORDER — MORPHINE SULFATE 4 MG/ML IJ SOLN
4.0000 mg | Freq: Once | INTRAMUSCULAR | Status: AC
  Administered 2015-03-18: 4 mg via INTRAVENOUS

## 2015-03-18 MED ORDER — SODIUM CHLORIDE 0.9 % IV BOLUS (SEPSIS)
1000.0000 mL | Freq: Once | INTRAVENOUS | Status: AC
  Administered 2015-03-18: 1000 mL via INTRAVENOUS

## 2015-03-18 MED ORDER — IOHEXOL 240 MG/ML SOLN: 25.0000 mL | Freq: Once | INTRAMUSCULAR | Status: DC | PRN

## 2015-03-18 MED ORDER — VITAMIN B-1 100 MG PO TABS
100.0000 mg | ORAL_TABLET | Freq: Every day | ORAL | Status: DC
  Administered 2015-03-19 – 2015-03-20 (×2): 100 mg via ORAL
  Filled 2015-03-18 (×3): qty 1

## 2015-03-18 MED ORDER — ACETAMINOPHEN 650 MG RE SUPP: 650.0000 mg | Freq: Four times a day (QID) | RECTAL | Status: DC | PRN

## 2015-03-18 MED ORDER — ADULT MULTIVITAMIN W/MINERALS CH
1.0000 | ORAL_TABLET | Freq: Every day | ORAL | Status: DC
  Administered 2015-03-19 – 2015-03-20 (×2): 1 via ORAL
  Filled 2015-03-18 (×3): qty 1

## 2015-03-18 MED ORDER — SODIUM CHLORIDE 0.9 % IV SOLN
INTRAVENOUS | Status: AC
  Administered 2015-03-18: 21:00:00 via INTRAVENOUS

## 2015-03-18 MED ORDER — SODIUM CHLORIDE 0.9 % IJ SOLN
3.0000 mL | Freq: Two times a day (BID) | INTRAMUSCULAR | Status: DC
  Administered 2015-03-18 – 2015-03-21 (×4): 3 mL via INTRAVENOUS

## 2015-03-18 MED ORDER — MORPHINE SULFATE 4 MG/ML IJ SOLN
INTRAMUSCULAR | Status: AC
  Filled 2015-03-18: qty 1

## 2015-03-18 MED ORDER — IOHEXOL 300 MG/ML  SOLN
100.0000 mL | Freq: Once | INTRAMUSCULAR | Status: AC | PRN
  Administered 2015-03-18: 85 mL via INTRAVENOUS

## 2015-03-18 MED ORDER — ONDANSETRON HCL 4 MG/2ML IJ SOLN: 4.0000 mg | Freq: Four times a day (QID) | INTRAMUSCULAR | Status: DC | PRN

## 2015-03-18 MED ORDER — LORAZEPAM 2 MG PO TABS
0.0000 mg | ORAL_TABLET | Freq: Two times a day (BID) | ORAL | Status: DC
  Administered 2015-03-20: 2 mg via ORAL

## 2015-03-18 MED ORDER — LORAZEPAM 2 MG PO TABS
0.0000 mg | ORAL_TABLET | Freq: Four times a day (QID) | ORAL | Status: AC
  Administered 2015-03-18 – 2015-03-19 (×2): 2 mg via ORAL
  Administered 2015-03-19: 1 mg via ORAL
  Administered 2015-03-19: 2 mg via ORAL
  Administered 2015-03-20: 4 mg via ORAL
  Administered 2015-03-20: 1 mg via ORAL
  Filled 2015-03-18: qty 1
  Filled 2015-03-18: qty 2
  Filled 2015-03-18 (×5): qty 1

## 2015-03-18 MED ORDER — LEVOTHYROXINE SODIUM 125 MCG PO TABS
250.0000 ug | ORAL_TABLET | Freq: Every day | ORAL | Status: DC
  Administered 2015-03-19 – 2015-03-20 (×2): 250 ug via ORAL
  Filled 2015-03-18 (×2): qty 2

## 2015-03-18 MED ORDER — MORPHINE SULFATE 2 MG/ML IJ SOLN
INTRAMUSCULAR | Status: AC
  Administered 2015-03-18: 2 mg via INTRAVENOUS
  Filled 2015-03-18: qty 1

## 2015-03-18 MED ORDER — LORAZEPAM 1 MG PO TABS
1.0000 mg | ORAL_TABLET | Freq: Four times a day (QID) | ORAL | Status: DC | PRN
  Administered 2015-03-19 – 2015-03-20 (×2): 1 mg via ORAL
  Filled 2015-03-18 (×3): qty 1

## 2015-03-18 MED ORDER — MORPHINE SULFATE 4 MG/ML IJ SOLN
4.0000 mg | Freq: Once | INTRAMUSCULAR | Status: DC
  Administered 2015-03-18: 4 mg via INTRAVENOUS

## 2015-03-18 MED ORDER — ENOXAPARIN SODIUM 40 MG/0.4ML ~~LOC~~ SOLN
40.0000 mg | SUBCUTANEOUS | Status: DC
  Administered 2015-03-19 – 2015-03-20 (×2): 40 mg via SUBCUTANEOUS
  Filled 2015-03-18 (×3): qty 0.4

## 2015-03-18 MED ORDER — FOLIC ACID 1 MG PO TABS
1.0000 mg | ORAL_TABLET | Freq: Every day | ORAL | Status: DC
  Administered 2015-03-19 – 2015-03-20 (×2): 1 mg via ORAL
  Filled 2015-03-18 (×3): qty 1

## 2015-03-18 MED ORDER — MORPHINE SULFATE 2 MG/ML IJ SOLN
2.0000 mg | INTRAMUSCULAR | Status: DC | PRN
  Administered 2015-03-18 – 2015-03-21 (×11): 2 mg via INTRAVENOUS
  Filled 2015-03-18 (×11): qty 1

## 2015-03-18 MED ORDER — NICOTINE 10 MG IN INHA
1.0000 | RESPIRATORY_TRACT | Status: DC | PRN
  Administered 2015-03-18 – 2015-03-21 (×3): 1 via RESPIRATORY_TRACT
  Filled 2015-03-18: qty 36

## 2015-03-18 MED ORDER — THIAMINE HCL 100 MG/ML IJ SOLN
100.0000 mg | Freq: Every day | INTRAMUSCULAR | Status: DC
  Filled 2015-03-18: qty 2

## 2015-03-18 MED ORDER — ONDANSETRON HCL 4 MG PO TABS
4.0000 mg | ORAL_TABLET | Freq: Four times a day (QID) | ORAL | Status: DC | PRN
  Administered 2015-03-18: 4 mg via ORAL
  Filled 2015-03-18: qty 1

## 2015-03-18 MED ORDER — ACETAMINOPHEN 325 MG PO TABS
650.0000 mg | ORAL_TABLET | Freq: Four times a day (QID) | ORAL | Status: DC | PRN
  Filled 2015-03-18: qty 2

## 2015-03-18 MED ORDER — LORAZEPAM 2 MG/ML IJ SOLN: 1.0000 mg | Freq: Four times a day (QID) | INTRAMUSCULAR | Status: DC | PRN

## 2015-03-18 MED ORDER — DULOXETINE HCL 60 MG PO CPEP
60.0000 mg | ORAL_CAPSULE | Freq: Every day | ORAL | Status: DC
  Administered 2015-03-19 – 2015-03-20 (×2): 60 mg via ORAL
  Filled 2015-03-18 (×3): qty 1

## 2015-03-18 NOTE — H&P (Signed)
[facility] NAME: [name]    MR#:  [id number]  DATE OF BIRTH:  12-08-1965  DATE OF ADMISSION:  03/18/2015  PRIMARY CARE PHYSICIAN: No primary care provider on file.   REQUESTING/REFERRING PHYSICIAN: Quale  CHIEF COMPLAINT:   Chief Complaint  Patient presents with  . Abdominal Pain    HISTORY OF PRESENT ILLNESS:  [name]  is a 49 y.o. female who presents with epigastric abdominal pain. Patient has a known history of chronic pancreatitis, and states that she had frequent flares until she had a stent put in, patient cannot clarify exactly where the stent was put in, though per chart review it seems that the plan was for a covered metal stent for gastrojejunostomy or perhaps a duodenal stent placement. Patient states that after that time her episodes of abdominal pain with acute nausea and vomiting subsided significantly. His most recent episode started 2-3 days ago, and the patient has also noted she's not had a bowel movement since that time, which she reports is similar to her historical episodes. Evaluation in the ED, her lipase is mildly elevated, and CT abdomen and pelvis shows pancreatic pseudocyst, and distal biliary duct stricture. Patient's blood alcohol level was also elevated, and she admits that she still a chronic alcoholic, and requests detox. Hospitalists were called for admission for chronic pancreatitis, and treatment for alcohol withdrawal. Of note, patient states that she has a history of seizures when she is in withdrawal.  PAST MEDICAL HISTORY:   Past Medical History  Diagnosis Date  . Asthma   . Chronic pancreatitis   . History of drug use   . Alcohol abuse   . Hypothyroidism   . Depression   . Hepatitis C   . COPD (chronic obstructive pulmonary disease)     PAST SURGICAL HISTORY:   Past Surgical History  Procedure Laterality Date  . Total thyroidectomy    . Cesarean section      SOCIAL  HISTORY:   History  Substance Use Topics  . Smoking status: Current Every Day Smoker -- 1.00 packs/day    Types: Cigarettes  . Smokeless tobacco: Not on file  . Alcohol Use: 0.0 oz/week    0 Standard drinks or equivalent per week    FAMILY HISTORY:   Family History  Problem Relation Age of Onset  . Diabetes Mellitus II Maternal Aunt   . Pancreatic cancer Paternal Aunt   . Diabetes Mellitus II Maternal Grandfather   . CAD Maternal Grandfather   . Diabetes Mellitus II Mother   . Liver cancer Mother   . Arthritis Mother     DRUG ALLERGIES:   Allergies  Allergen Reactions  . Penicillins Rash    MEDICATIONS AT HOME:   Prior to Admission medications   Medication Sig Start Date End Date Taking? Authorizing Provider  clonazePAM (KLONOPIN) 1 MG tablet Take 1 mg by mouth 3 (three) times daily as needed for anxiety.    Yes Historical Provider, MD  levothyroxine (SYNTHROID, LEVOTHROID) 50 MCG tablet Take 250 mcg by mouth daily before breakfast.   Yes Historical Provider, MD  DULoxetine (CYMBALTA) 60 MG capsule Take 1 capsule (60 mg total) by mouth daily. 03/05/15   Kathrine Haddock, NP    REVIEW OF SYSTEMS:  Review of Systems  Constitutional: Negative for fever, chills, weight loss and malaise/fatigue.  HENT: Negative for ear pain, hearing loss and tinnitus.   Eyes: Negative for blurred vision, double vision,  pain and redness.  Respiratory: Negative for cough, hemoptysis and shortness of breath.   Cardiovascular: Negative for chest pain, palpitations, orthopnea and leg swelling.  Gastrointestinal: Positive for nausea, vomiting and abdominal pain. Negative for diarrhea and constipation.  Genitourinary: Negative for dysuria, frequency and hematuria.  Musculoskeletal: Negative for back pain, joint pain and neck pain.  Skin:       No acne, rash, or lesions  Neurological: Negative for dizziness, tremors, focal weakness and weakness.  Endo/Heme/Allergies: Negative for polydipsia. Does  not bruise/bleed easily.  Psychiatric/Behavioral: Negative for depression. The patient is nervous/anxious. The patient does not have insomnia.      VITAL SIGNS:   Filed Vitals:   03/18/15 1424 03/18/15 1721 03/18/15 1727  BP: 161/126 145/104   Pulse: 127  103  Resp: 28  17  Height: 5\' 4"  (1.626 m)    Weight: 54.432 kg (120 lb)    SpO2: 96%  97%   Wt Readings from Last 3 Encounters:  03/18/15 54.432 kg (120 lb)    PHYSICAL EXAMINATION:  Physical Exam  Constitutional: She is oriented to person, place, and time. She appears well-developed and well-nourished. No distress.  HENT:  Head: Normocephalic and atraumatic.  Mouth/Throat: Oropharynx is clear and moist.  Eyes: Conjunctivae and EOM are normal. Pupils are equal, round, and reactive to light. No scleral icterus.  Neck: Normal range of motion. Neck supple. No JVD present. No thyromegaly present.  Cardiovascular: Regular rhythm and intact distal pulses.  Exam reveals no gallop and no friction rub.   No murmur heard. Tachycardic  Respiratory: Effort normal and breath sounds normal. No respiratory distress. She has no wheezes. She has no rales.  GI: Soft. Bowel sounds are normal. She exhibits no distension. There is tenderness (epigastric).  Musculoskeletal: Normal range of motion. She exhibits no edema.  No arthritis, no gout  Lymphadenopathy:    She has no cervical adenopathy.  Neurological: She is alert and oriented to person, place, and time. No cranial nerve deficit.  No dysarthria, no aphasia  Skin: Skin is warm and dry. No rash noted. No erythema.  Psychiatric: Her behavior is normal. Judgment and thought content normal.  Patient is very anxious    LABORATORY PANEL:   CBC  Recent Labs Lab 03/18/15 1447  WBC 8.6  HGB 13.9  HCT 41.4  PLT 220   ------------------------------------------------------------------------------------------------------------------  Chemistries   Recent Labs Lab 03/18/15 1439  NA  132*  K 4.8  CL 100*  CO2 21*  GLUCOSE 98  BUN 14  CREATININE 0.64  CALCIUM 8.2*  AST 60*  ALT 30  ALKPHOS 105  BILITOT 0.8   ------------------------------------------------------------------------------------------------------------------  Cardiac Enzymes No results for input(s): TROPONINI in the last 168 hours. ------------------------------------------------------------------------------------------------------------------  RADIOLOGY:  Ct Abdomen Pelvis W Contrast  03/18/2015   CLINICAL DATA:  Abdominal pain with nausea and vomiting. Elevated lipase.  EXAM: CT ABDOMEN AND PELVIS WITH CONTRAST  TECHNIQUE: Multidetector CT imaging of the abdomen and pelvis was performed using the standard protocol following bolus administration of intravenous contrast.  CONTRAST:  52mL OMNIPAQUE IOHEXOL 300 MG/ML  SOLN  COMPARISON:  12/27/2013  FINDINGS: Lower chest:  Normal.  Hepatobiliary: There is increased intrahepatic biliary ductal dilatation. Common hepatic and proximal common bile duct are dilated slightly more prominently than on the prior study. There is an area of narrowing in the common bile duct is it enters the pancreatic head, most likely a stricture. No definable mass in the pancreatic head. The body of the  pancreas appears normal.  There is an oblong 2.4 x 1.8 x 1.5 cm fluid collection which appears to be with in the posterior wall of the body of the stomach best seen on image 21 of series 2. I suspect this represents a small pancreatic pseudocyst.  Pancreas: Pancreatic parenchyma appears normal. No dilatation of the pancreatic duct. Common bile duct is rapidly taper as it enters the pancreatic head. I suspect the patient has a stricture from previous pancreatitis.  Spleen: Normal.  Adrenals/Urinary Tract: Normal.  Stomach/Bowel: Probable pancreatic pseudocyst in the posterior wall of the body of the stomach. Otherwise normal.  Vascular/Lymphatic: Normal.  Reproductive: Normal.  Other: No free  air or free fluid.  Musculoskeletal: Normal.  IMPRESSION: 1. Increased biliary ductal dilatation with rapid tapering of the common duct as it enters the pancreatic head. I suspect the patient has a distal common bile duct stricture. 2. New pancreatic pseudo cyst in the posterior wall of the body of the stomach.   Electronically Signed   By: Lorriane Shire M.D.   On: 03/18/2015 17:29    EKG:   Orders placed or performed during the hospital encounter of 03/18/15  . ED EKG  . ED EKG    IMPRESSION AND PLAN:  Principal Problem:   Chronic pancreatitis - ideally the patient will be made nothing by mouth, but she is insisting that she is very hungry once try to eat we will try a brief one time trial of clear liquids and see how patient tolerates it, with the suspicion that she will need to be nothing by mouth after this. Pain control on board, aggressive fluid hydration. Consult GI. Active Problems:   Pancreatic pseudocyst - this seems to be new, we will consult gastroenterology for their opinion and recommendations.   Alcohol abuse - CIWA protocol, vitamin replacement, hydration, scheduled benzodiazepines, seizure precautions.   Abdominal pain, epigastric - likely due to her chronic pancreatitis, pancreatic pseudocyst, and some question as to this history of prior gastric outlet obstruction. Appreciate gastroenterology recommendations. PPI while here   Hypothyroidism - home dose Synthroid   Depression - continue Cymbalta at home dose  All the records are reviewed and case discussed with ED provider. Management plans discussed with the patient and/or family.  DVT PROPHYLAXIS: SubQ lovenox  ADMISSION STATUS: Inpatient  CODE STATUS: Full  TOTAL TIME TAKING CARE OF THIS PATIENT: 45 minutes.    Shadavia Dampier [place] 03/18/2015, 6:51 PM  [facility]  Office  [phone]  CC: Primary care physician; No primary care provider on file.

## 2015-03-18 NOTE — ED Provider Notes (Signed)
[facility] Emergency Department Provider Note  ____________________________________________  Time seen: Approximately 2:37 PM  I have reviewed the triage vital signs and the nursing notes.   HISTORY  Chief Complaint Abdominal Pain    HPI [name] is a 48 y.o. female presents for abdominal pain. Patient reportsthat she started having trouble moving her bowels about 3 days ago and has not had a bowel movement since. Last night she drank 2 glasses of wine at a wedding, states that she is not a regular drinker, and then this morning noticed she is having severe abdominal pain in the upper abdomen. This is similar to pain she has experienced with pancreatitis previously. She has a stent which was placed at [facility] and her pancreas previously.  She denies pregnancy. She states she is 49 years old, and has not had sex for over one year.  Denies any vaginal discharge or lower abdominal pain. States her abdomen does feel bloated however. She did vomit twice this morning.   Past Medical History  Diagnosis Date  . Asthma     There are no active problems to display for this patient.   History reviewed. No pertinent past surgical history.  Current Outpatient Rx  Name  Route  Sig  Dispense  Refill  . DULoxetine (CYMBALTA) 60 MG capsule   Oral   Take 1 capsule (60 mg total) by mouth daily.   30 capsule   2     Allergies Review of patient's allergies indicates not on file.  History reviewed. No pertinent family history.  Social History History  Substance Use Topics  . Smoking status: Not on file  . Smokeless tobacco: Not on file  . Alcohol Use: Not on file    Review of Systems Constitutional: No fever/chills Eyes: No visual changes. ENT: No sore throat. Cardiovascular: Denies chest pain. Respiratory: Denies shortness of breath. Gastrointestinal: See history of present illness Genitourinary: Negative for dysuria. Musculoskeletal: Negative for  back pain. Skin: Negative for rash. Neurological: Negative for headaches, focal weakness or numbness.  10-point ROS otherwise negative.  ____________________________________________   PHYSICAL EXAM:  VITAL SIGNS: ED Triage Vitals  Enc Vitals Group     BP 03/18/15 1424 161/126 mmHg     Pulse Rate 03/18/15 1424 127     Resp 03/18/15 1424 28     Temp --      Temp src --      SpO2 03/18/15 1424 96 %     Weight 03/18/15 1424 120 lb (54.432 kg)     Height 03/18/15 1424 5\' 4"  (1.626 m)     Head Cir --      Peak Flow --      Pain Score --      Pain Loc --      Pain Edu? --      Excl. in [place]? --     Constitutional: Alert and oriented. Patient is sitting up in bed, appears in significant pain and very anxious. Eyes: Conjunctivae are normal. PERRL. EOMI. Head: Atraumatic. Nose: No congestion/rhinnorhea. Mouth/Throat: Mucous membranes are moist.  Oropharynx non-erythematous. Neck: No stridor.   Cardiovascular:  Tachycardic but regular rhythm. Grossly normal heart sounds.  Good peripheral circulation. Respiratory: Slight increased respiratory rate, but no evidence of wheezing or difficulty breathing.  No retractions. Lungs CTAB. Gastrointestinal: Significant for a tender over the epigastrium, there is a reducible ventral hernia when she sits up, no pain in the lower abdomen, no rebound or guarding. No abdominal  bruits. No CVA tenderness. Musculoskeletal: No lower extremity tenderness nor edema.  No joint effusions. Neurologic:  Normal speech and language. No gross focal neurologic deficits are appreciated. Speech is normal.Skin:  Skin is warm, dry and intact. No rash noted. Psychiatric: Mood and affect are normal. Speech and behavior are normal.  ____________________________________________   LABS (all labs ordered are listed, but only abnormal results are displayed)  Labs Reviewed  COMPREHENSIVE METABOLIC PANEL - Abnormal; Notable for the following:    Sodium 132 (*)    Chloride  100 (*)    CO2 21 (*)    Calcium 8.2 (*)    Total Protein 9.1 (*)    AST 60 (*)    All other components within normal limits  LIPASE, BLOOD - Abnormal; Notable for the following:    Lipase 60 (*)    All other components within normal limits  ETHANOL - Abnormal; Notable for the following:    Alcohol, Ethyl (B) 239 (*)    All other components within normal limits  CBC WITH DIFFERENTIAL/PLATELET - Abnormal; Notable for the following:    MCV 106.0 (*)    MCH 35.6 (*)    All other components within normal limits  HCG, QUANTITATIVE, PREGNANCY  URINALYSIS COMPLETEWITH MICROSCOPIC ([facility] ONLY)   ____________________________________________  EKG  ED ECG REPORT I, Cotey Rakes, the attending physician, personally viewed and interpreted this ECG.   Date: 03/18/2015  EKG Time: 1512  Rate: 110  Rhythm: nonspecific ST and T waves changes, sinus tachycardia  Axis: normal  Intervals:none  ST&T Change: Nonspecific ST changes, likely due to artifact. There is no obvious acute ischemic change. EKG was attempted twice, but due to artifact read is challenging.  ____________________________________________  RADIOLOGY  CT Abdomen Pelvis W Contrast (Final result) Result time: 03/18/15 17:29:09   Final result by Rad Results In Interface (03/18/15 17:29:09)   Narrative:   CLINICAL DATA: Abdominal pain with nausea and vomiting. Elevated lipase.  EXAM: CT ABDOMEN AND PELVIS WITH CONTRAST  TECHNIQUE: Multidetector CT imaging of the abdomen and pelvis was performed using the standard protocol following bolus administration of intravenous contrast.  CONTRAST: 73mL OMNIPAQUE IOHEXOL 300 MG/ML SOLN  COMPARISON: 12/27/2013  FINDINGS: Lower chest: Normal.  Hepatobiliary: There is increased intrahepatic biliary ductal dilatation. Common hepatic and proximal common bile duct are dilated slightly more prominently than on the prior study. There is an area of narrowing in the common bile  duct is it enters the pancreatic head, most likely a stricture. No definable mass in the pancreatic head. The body of the pancreas appears normal.  There is an oblong 2.4 x 1.8 x 1.5 cm fluid collection which appears to be with in the posterior wall of the body of the stomach best seen on image 21 of series 2. I suspect this represents a small pancreatic pseudocyst.  Pancreas: Pancreatic parenchyma appears normal. No dilatation of the pancreatic duct. Common bile duct is rapidly taper as it enters the pancreatic head. I suspect the patient has a stricture from previous pancreatitis.  Spleen: Normal.  Adrenals/Urinary Tract: Normal.  Stomach/Bowel: Probable pancreatic pseudocyst in the posterior wall of the body of the stomach. Otherwise normal.  Vascular/Lymphatic: Normal.  Reproductive: Normal.  Other: No free air or free fluid.  Musculoskeletal: Normal.  IMPRESSION: 1. Increased biliary ductal dilatation with rapid tapering of the common duct as it enters the pancreatic head. I suspect the patient has a distal common bile duct stricture. 2. New pancreatic pseudo cyst in the  posterior wall of the body of the stomach.    ____________________________________________   PROCEDURES  Procedure(s) performed: None  Critical Care performed: No  ____________________________________________   INITIAL IMPRESSION / ASSESSMENT AND PLAN / ED COURSE  Pertinent labs & imaging results that were available during my care of the patient were reviewed by me and considered in my medical decision making (see chart for details).  Patient presents with difficulty going to the bathroom for 3 days, and then sudden severe upper abdominal pain over the epigastrium. Given the patient's clinical history, which I have reviewed in caring every where, it would appear that pancreatitis is a likely possibility. I see from previous GI notes that she had a pancreatic type stent that was supposed to  be removed but it appears that she did not go for removal questionably. In addition, the patient does have a history of previous abdominal surgeries.  Based on her examination I suspect acute pancreatitis, though certainly considerations would be acute bowel obstruction, abdominal perforation, pseudocyst formation, or other acute abnormalities. The patient states that she only had 2 glasses of alcohol last night, but I will send an ethanol level is the patient's behavior is quite anxious and I question if she may have had more than 2 drinks last night.  ----------------------------------------- 4:07 PM on 03/18/2015 -----------------------------------------  Blood alcohol level was quite elevated. I discussed this with the patient and asked her to be quite honest, and she apologizes and states that she is actually an alcoholic and she is been drinking heavily for the last 3 months. She didn't initially tell me this because she has a history of pancreatitis and knows that alcohol is supposed to be avoided. She is very tearful in discussing this, but she denies being had any risk of harming herself but states that she is definitely having trouble dealing with alcoholism in her life. Her pain is improved with her morphine, but she is extremely anxious. I will wait a little while but anticipate that we may need to give her some Ativan to help calm her throughout evaluation as I'm quite concerned she could have a complication from her stent.  ----------------------------------------- 6:09 PM on 03/18/2015 -----------------------------------------  Discussed with [facility] Dr. Amanda Cockayne the patient is well known to their service. Reviewed CT and laboratory evaluation as well as exam, [facility] recommends that no procedure be performed at this time as LFTs are not significant, elevated. They would recommend making her nothing by mouth, controlling her pain and treating her for what appears to be chronic  pancreatitis.  Patient saw remains in severe pain after total of 12 mg of morphine, I have ordered additional this time. We'll admit the patient here for further evaluation and hospitalist service as well as treatment for anticipated alcohol withdrawal for which she has had complex withdrawal seizures in the past. ____________________________________________   FINAL CLINICAL IMPRESSION(S) / ED DIAGNOSES  Final diagnoses:  Alcohol-induced chronic pancreatitis  Alcohol abuse      Delman Kitten, MD 03/18/15 1811

## 2015-03-18 NOTE — ED Notes (Signed)
Pt arrived via EMS from home. Pt has severe abdominal pain. States she has a pancreatic stent that hasn't been checked in a while. Pt is tearful and very anxious.

## 2015-03-18 NOTE — ED Notes (Signed)
Pt states she has agoraphobia , and is very anxious at this time

## 2015-03-18 NOTE — ED Notes (Signed)
Pt with ct

## 2015-03-19 LAB — URINALYSIS COMPLETE WITH MICROSCOPIC (ARMC ONLY)
BILIRUBIN URINE: NEGATIVE
Bacteria, UA: NONE SEEN
GLUCOSE, UA: NEGATIVE mg/dL
Hgb urine dipstick: NEGATIVE
Ketones, ur: NEGATIVE mg/dL
Leukocytes, UA: NEGATIVE
Nitrite: NEGATIVE
PH: 5 (ref 5.0–8.0)
PROTEIN: NEGATIVE mg/dL
Specific Gravity, Urine: 1.041 — ABNORMAL HIGH (ref 1.005–1.030)

## 2015-03-19 LAB — COMPREHENSIVE METABOLIC PANEL
ALT: 38 U/L (ref 14–54)
ANION GAP: 7 (ref 5–15)
AST: 149 U/L — ABNORMAL HIGH (ref 15–41)
Albumin: 3.1 g/dL — ABNORMAL LOW (ref 3.5–5.0)
Alkaline Phosphatase: 131 U/L — ABNORMAL HIGH (ref 38–126)
BUN: 14 mg/dL (ref 6–20)
CALCIUM: 7.5 mg/dL — AB (ref 8.9–10.3)
CHLORIDE: 108 mmol/L (ref 101–111)
CO2: 23 mmol/L (ref 22–32)
Creatinine, Ser: 0.52 mg/dL (ref 0.44–1.00)
GFR calc Af Amer: >60 mL/min (ref >60)
Glucose, Bld: 83 mg/dL (ref 65–99)
Potassium: 3.1 mmol/L — ABNORMAL LOW (ref 3.5–5.1)
Sodium: 138 mmol/L (ref 135–145)
TOTAL PROTEIN: 6.4 g/dL — AB (ref 6.5–8.1)
Total Bilirubin: 1.5 mg/dL — ABNORMAL HIGH (ref 0.3–1.2)

## 2015-03-19 LAB — CBC
HCT: 37.3 % (ref 35.0–47.0)
HEMOGLOBIN: 12.2 g/dL (ref 12.0–16.0)
MCH: 35.6 pg — ABNORMAL HIGH (ref 26.0–34.0)
MCHC: 32.8 g/dL (ref 32.0–36.0)
MCV: 108.4 fL — ABNORMAL HIGH (ref 80.0–100.0)
Platelets: 150 10*3/uL (ref 150–440)
RBC: 3.44 MIL/uL — ABNORMAL LOW (ref 3.80–5.20)
RDW: 13.4 % (ref 11.5–14.5)
WBC: 5.4 10*3/uL (ref 3.6–11.0)

## 2015-03-19 LAB — URINE DRUG SCREEN, QUALITATIVE (ARMC ONLY)
Amphetamines, Ur Screen: NOT DETECTED
Barbiturates, Ur Screen: NOT DETECTED
Benzodiazepine, Ur Scrn: POSITIVE — AB
CANNABINOID 50 NG, UR ~~LOC~~: POSITIVE — AB
Cocaine Metabolite,Ur ~~LOC~~: POSITIVE — AB
MDMA (ECSTASY) UR SCREEN: NOT DETECTED
METHADONE SCREEN, URINE: NOT DETECTED
OPIATE, UR SCREEN: POSITIVE — AB
Phencyclidine (PCP) Ur S: NOT DETECTED
TRICYCLIC, UR SCREEN: NOT DETECTED

## 2015-03-19 LAB — LIPASE, BLOOD: LIPASE: 47 U/L (ref 22–51)

## 2015-03-19 LAB — BASIC METABOLIC PANEL
Anion gap: 5 (ref 5–15)
BUN: 13 mg/dL (ref 6–20)
CO2: 24 mmol/L (ref 22–32)
CREATININE: 0.58 mg/dL (ref 0.44–1.00)
Calcium: 7.7 mg/dL — ABNORMAL LOW (ref 8.9–10.3)
Chloride: 114 mmol/L — ABNORMAL HIGH (ref 101–111)
GFR calc Af Amer: >60 mL/min (ref >60)
GFR calc non Af Amer: >60 mL/min (ref >60)
GLUCOSE: 80 mg/dL (ref 65–99)
Potassium: 3.5 mmol/L (ref 3.5–5.1)
Sodium: 143 mmol/L (ref 135–145)

## 2015-03-19 MED ORDER — SODIUM CHLORIDE 0.9 % IV SOLN
INTRAVENOUS | Status: DC
  Administered 2015-03-19: 15:00:00 via INTRAVENOUS

## 2015-03-19 MED ORDER — MORPHINE SULFATE 2 MG/ML IJ SOLN
2.0000 mg | INTRAMUSCULAR | Status: AC
  Administered 2015-03-19: 2 mg via INTRAVENOUS
  Filled 2015-03-19: qty 1

## 2015-03-19 NOTE — Progress Notes (Signed)
[facility] [place] NAME: [name]    MR#:  [id number]  DATE OF BIRTH:  1966/06/18  SUBJECTIVE:  CHIEF COMPLAINT:   Chief Complaint  Patient presents with  . Abdominal Pain   Continued abdominal pain. No real improvement.  REVIEW OF SYSTEMS:   Review of Systems  Constitutional: Negative for fever.  Respiratory: Negative for shortness of breath.   Cardiovascular: Negative for chest pain and palpitations.  Gastrointestinal: Positive for nausea, vomiting and abdominal pain. Negative for diarrhea.  Genitourinary: Negative for dysuria.    DRUG ALLERGIES:   Allergies  Allergen Reactions  . Penicillins Rash    VITALS:  Blood pressure 115/82, pulse 78, temperature 97.8 F (36.6 C), temperature source Oral, resp. rate 14, height 5\' 4"  (1.626 m), weight 53.615 kg (118 lb 3.2 oz), SpO2 99 %.  PHYSICAL EXAMINATION:  GENERAL:  49 y.o.-year-old patient lying in the bed, uncomfortable EYES: Pupils equal, round, reactive to light and accommodation. No scleral icterus. Extraocular muscles intact.  HEENT: Head atraumatic, normocephalic. Oropharynx and nasopharynx clear.  NECK:  Supple, no jugular venous distention. No thyroid enlargement, no tenderness.  LUNGS: Normal breath sounds bilaterally, no wheezing, rales,rhonchi or crepitation. No use of accessory muscles of respiration.  CARDIOVASCULAR: S1, S2 normal. No murmurs, rubs, or gallops.  ABDOMEN: Soft, diffusely tender, most in the upper quadrants and epigastric area, nondistended. Bowel sounds present. No organomegaly or mass.  EXTREMITIES: No pedal edema, cyanosis, or clubbing.  NEUROLOGIC: Cranial nerves II through XII are intact. Muscle strength 5/5 in all extremities. Sensation intact. Gait not checked.  PSYCHIATRIC: The patient is alert and oriented x 3.  SKIN: No obvious rash, lesion, or ulcer.    LABORATORY PANEL:   CBC  Recent Labs Lab 03/19/15 0601  WBC 5.4   HGB 12.2  HCT 37.3  PLT 150   ------------------------------------------------------------------------------------------------------------------  Chemistries   Recent Labs Lab 03/18/15 1439 03/19/15 0601  NA 132* 143  K 4.8 3.5  CL 100* 114*  CO2 21* 24  GLUCOSE 98 80  BUN 14 13  CREATININE 0.64 0.58  CALCIUM 8.2* 7.7*  AST 60*  --   ALT 30  --   ALKPHOS 105  --   BILITOT 0.8  --    ------------------------------------------------------------------------------------------------------------------  Cardiac Enzymes No results for input(s): TROPONINI in the last 168 hours. ------------------------------------------------------------------------------------------------------------------  RADIOLOGY:  Ct Abdomen Pelvis W Contrast  03/18/2015   CLINICAL DATA:  Abdominal pain with nausea and vomiting. Elevated lipase.  EXAM: CT ABDOMEN AND PELVIS WITH CONTRAST  TECHNIQUE: Multidetector CT imaging of the abdomen and pelvis was performed using the standard protocol following bolus administration of intravenous contrast.  CONTRAST:  92mL OMNIPAQUE IOHEXOL 300 MG/ML  SOLN  COMPARISON:  12/27/2013  FINDINGS: Lower chest:  Normal.  Hepatobiliary: There is increased intrahepatic biliary ductal dilatation. Common hepatic and proximal common bile duct are dilated slightly more prominently than on the prior study. There is an area of narrowing in the common bile duct is it enters the pancreatic head, most likely a stricture. No definable mass in the pancreatic head. The body of the pancreas appears normal.  There is an oblong 2.4 x 1.8 x 1.5 cm fluid collection which appears to be with in the posterior wall of the body of the stomach best seen on image 21 of series 2. I suspect this represents a small pancreatic pseudocyst.  Pancreas: Pancreatic parenchyma appears normal. No dilatation of the pancreatic duct.  Common bile duct is rapidly taper as it enters the pancreatic head. I suspect the patient has  a stricture from previous pancreatitis.  Spleen: Normal.  Adrenals/Urinary Tract: Normal.  Stomach/Bowel: Probable pancreatic pseudocyst in the posterior wall of the body of the stomach. Otherwise normal.  Vascular/Lymphatic: Normal.  Reproductive: Normal.  Other: No free air or free fluid.  Musculoskeletal: Normal.  IMPRESSION: 1. Increased biliary ductal dilatation with rapid tapering of the common duct as it enters the pancreatic head. I suspect the patient has a distal common bile duct stricture. 2. New pancreatic pseudo cyst in the posterior wall of the body of the stomach.   Electronically Signed   By: Lorriane Shire M.D.   On: 03/18/2015 17:29    EKG:   Orders placed or performed during the hospital encounter of 03/18/15  . ED EKG  . ED EKG  . EKG 12-Lead  . EKG 12-Lead  . EKG 12-Lead  . EKG 12-Lead    ASSESSMENT AND PLAN:   Principal Problem:   Chronic pancreatitis Active Problems:   Pancreatic pseudocyst   Alcohol abuse   Hypothyroidism   Depression   Abdominal pain, epigastric  #1 acute on chronic pancreatitis with pancreatic pseudocyst: I have discussed this case with Dr. Gustavo Lah who feels that the patient needs transfer to [facility] for drainage of this pseudocyst. She was transferred there 2 years ago for similar presentation. I have discussed this case with Dr. Ellender Hose of [facility] gastroenterology. There is a long wait for medicine beds at [facility] at this time. As the patient is currently stable I will call again in the morning to reach Dr. Okey Dupre the patient's gastroenterologist. The number for the GI procedures center is [phone]. The number for the consultation center is [phone]. Dr. Ellender Hose thinks it may be more likely that we would be able to get her to [facility] for the procedure and then returned to [facility] rather than having her admitted at [facility]. For now she will remain nothing by mouth, hydrate with IV fluids and continue pain control.  #2 alcohol abuse  with high risk for seizure versus DTs: Continue CIWA protocol.     All the records are reviewed and case discussed with Care Management/Social Workerr. Management plans discussed with the patient, family and they are in agreement.  CODE STATUS: full  TOTAL TIME TAKING CARE OF THIS PATIENT: 45 minutes.   POSSIBLE D/C IN 2-3 DAYS, DEPENDING ON CLINICAL CONDITION.  Greater than 50% of time spent in care coordination and counseling.  Myrtis Ser M.D on 03/19/2015 at 1:35 PM  Between 7am to 6pm - Pager - [phone]  After 6pm go to [email or web address] - password [email or web address] [facility]  [facility]  Office  [phone]  CC: Primary care physician; No primary care provider on file.

## 2015-03-19 NOTE — Progress Notes (Signed)
Initial Nutrition Assessment     INTERVENTION:   Meals/Snacks: cater to pt preferences Medical Food Supplement: may benefit from addition of nutritional supplement once diet advanced  NUTRITION DIAGNOSIS:  Inadequate oral intake related to altered GI function as evidenced by NPO status.   GOAL:  Patient will meet greater than or equal to 90% of their needs  MONITOR:   (Energy Intake, Anthropometrics, Digestive System, Electrolyte/Renal Profile)  REASON FOR ASSESSMENT:   (Diagnosis)    ASSESSMENT:  Pt admitted with abdominal pain with hx of chronic pancreatitis, pancreatic pseudocyst, GI consulted; on CIWA, sleeping on visit today  PMHx:  Past Medical History  Diagnosis Date  . Asthma   . Chronic pancreatitis   . History of drug use   . Alcohol abuse   . Hypothyroidism   . Depression   . Hepatitis C   . COPD (chronic obstructive pulmonary disease)     Diet Order: CL  Current Nutrition: tolerated CL tray this AM, asking for chicken broth  Food/Nutrition-Related History: unable to assess   Medications: reviewed  Electrolyte/Renal Profile and Glucose Profile:   Recent Labs Lab 03/18/15 1439 03/19/15 0601  NA 132* 143  K 4.8 3.5  CL 100* 114*  CO2 21* 24  BUN 14 13  CREATININE 0.64 0.58  CALCIUM 8.2* 7.7*  GLUCOSE 98 80   Protein Profile:   Recent Labs Lab 03/18/15 1439  ALBUMIN 4.1   Lipase     Component Value Date/Time   LIPASE 60* 03/18/2015 1439    Gastrointestinal Profile: pt reports chronic abdominal pain and chronic nausea; reports hx of constipation and diarrhea; no vomitting at present, not on enzyme therapy for pancreatitis   Nutrition-Focused Physical Exam Findings: pt declined at this time due to wanting to sleep   Weight Change: pt reports 6 pound wt loss in 1 month; 4.8% wt loss Anthropometrics:    Height:  Ht Readings from Last 1 Encounters:  03/18/15 5\' 4"  (1.626 m)    Weight:  Wt Readings from Last 1  Encounters:  03/19/15 118 lb 3.2 oz (53.615 kg)    Filed Weights   03/18/15 1424 03/18/15 2026 03/19/15 0458  Weight: 120 lb (54.432 kg) 118 lb 3.2 oz (53.615 kg) 118 lb 3.2 oz (53.615 kg)     Wt Readings from Last 10 Encounters:  03/19/15 118 lb 3.2 oz (53.615 kg)    BMI:  Body mass index is 20.28 kg/(m^2).  Estimated Nutritional Needs:  Kcal:  1631-1780 kcals (BEE 1141, 1.3 AF, 1.1-1.2 IF)   Protein:  65-76 g (1.2-1.4 g/kg)   Fluid:  1620-1890 mL (30-35 ml/kg)   Skin:  Reviewed, no issues  Diet Order:  Diet clear liquid Room service appropriate?: Yes; Fluid consistency:: Thin     MODERATE Care Level   Kerman Passey MS, RD, LDN [phone] Pager

## 2015-03-19 NOTE — Consult Note (Signed)
GI Inpatient Consult Note Lollie Sails MD  Reason for Consult: Abdominal pain, pancreatitis   Attending Requesting Consult: Dr. Lance Coon  Outpatient Primary Physician: No primary care provider on file  History of Present Illness: [name] is a 49 y.o. female with a history of substance abuse scented to the emergency room yesterday afternoon with problems of nausea vomiting and will pain. She doesn't remember a lot about past couple of days. As a history of alcohol abuse and states that over the past couple of much she has been drinking alcohol quite heavily. She has a history of chronic pancreatitis.  She states that she frequently has episodes of nausea and occasionally some small volume emesis. States over the past several weeks she has had increased abdominal pain and swelling much more so over the past couple days. States her bowel habits are usually regular but she is felt constipated for several days. He does have increased heartburn symptoms and abdominal swelling but no dysphagia. Occasional sees a flash of blood on the toilet paper but not in the commode. Abdominal pain tends to be generalized mostly epigastric to left upper quadrant.  She has an extensive history in regards to her pancreatitis. Multiple episodes of pancreatitis over the past number of years. 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 She was transferred from that hospitalization to [facility] as she underwent ERCP with a gastroduodenostomy, a transmural stent from the stomach vault into the pseudocyst or drainage. He has had MRCP in the past that showed a similar dilatation of the intra-and extrahepatic bile ducts.  On this hospitalization she has had a CT scan showing a recurrent pancreatic pseudocyst with evidence of a possible distal common bile duct stricture the level of  the pancreatic head. He also has a history of gastric outlet obstruction/partial has had screens in the past positive for cocaine as well as cannabinoids as well as alcohol use/abuse. On this admission her lipase was 60 on a scale of 51. Liver associated enzymes are elevated in an alcohol-related pattern. Her MCV is 106. She had a markedly elevated alcohol level on admission.  Past Medical History:  Past Medical History  Diagnosis Date  . Asthma   . Chronic pancreatitis   . History of drug use   . Alcohol abuse   . Hypothyroidism   . Depression   . Hepatitis C   . COPD (chronic obstructive pulmonary disease)     Problem List: Patient Active Problem List   Diagnosis Date Noted  . Chronic pancreatitis 03/18/2015  . Pancreatic pseudocyst 03/18/2015  . Alcohol abuse 03/18/2015  . Hypothyroidism 03/18/2015  . Depression 03/18/2015  . Abdominal pain, epigastric 03/18/2015    Past Surgical History: Past Surgical History  Procedure Laterality Date  . Total thyroidectomy    . Cesarean section      Allergies: Allergies  Allergen Reactions  . Penicillins Rash    Home Medications: Prescriptions prior to admission  Medication Sig Dispense Refill Last Dose  . albuterol (PROVENTIL HFA;VENTOLIN HFA) 108 (90 BASE) MCG/ACT inhaler Inhale 2 puffs into the lungs every 6 (six) hours as needed for wheezing or shortness of breath.   03/18/2015 at Unknown time  . clonazePAM (KLONOPIN) 1 MG tablet Take 1 mg by mouth 3 (three) times daily as needed for anxiety.    03/16/2015  .  DULoxetine (CYMBALTA) 60 MG capsule Take 1 capsule (60 mg total) by mouth daily. 30 capsule 2 03/16/2015  . levothyroxine (SYNTHROID, LEVOTHROID) 200 MCG tablet Take 200 mcg by mouth daily before breakfast. Take along with 50 mcg to equal 250 mcg total dose.   03/16/2015  . levothyroxine (SYNTHROID, LEVOTHROID) 50 MCG tablet Take 50 mcg by mouth daily before breakfast. Take along with 200 mcg tablet to equal 250 mcg total dose.    03/16/2015   Home medication reconciliation was completed with the patient.   Scheduled Inpatient Medications:   . DULoxetine  60 mg Oral Daily  . enoxaparin (LOVENOX) injection  40 mg Subcutaneous Q24H  . folic acid  1 mg Oral Daily  . levothyroxine  250 mcg Oral QAC breakfast  . LORazepam  0-4 mg Oral Q6H   Followed by  . [START ON 03/20/2015] LORazepam  0-4 mg Oral Q12H  . multivitamin with minerals  1 tablet Oral Daily  . sodium chloride  3 mL Intravenous Q12H  . thiamine  100 mg Oral Daily   Or  . thiamine  100 mg Intravenous Daily    Continuous Inpatient Infusions:   . sodium chloride      PRN Inpatient Medications:  acetaminophen **OR** acetaminophen, LORazepam **OR** LORazepam, morphine injection, nicotine, ondansetron **OR** ondansetron (ZOFRAN) IV  Family History: family history includes Arthritis in her mother; CAD in her maternal grandfather; Diabetes Mellitus II in her maternal aunt, maternal grandfather, and mother; Liver cancer in her mother; Pancreatic cancer in her paternal aunt.   GI Family History: Noncontributory  Social History:   reports that she has been smoking Cigarettes.  She has been smoking about 1.00 pack per day. She does not have any smokeless tobacco history on file. She reports that she drinks alcohol. She reports that she does not use illicit drugs. The patient denies ETOH, tobacco, or drug use.   ROS  Review of Systems: 10 systems reviewed per admission history and physical agree with same  Physical Examination: BP 115/82 mmHg  Pulse 78  Temp(Src) 97.8 F (36.6 C) (Oral)  Resp 14  Ht 5\' 4"  (1.626 m)  Wt 53.615 kg (118 lb 3.2 oz)  BMI 20.28 kg/m2  SpO2 99% Gen: Ruddy complected, disheveled female, concentrating on her liquid diet. HEENT: Normocephalic atraumatic eyes are anicteric Neck: Supple no JVD Chest: Clear to auscultation CV: Regular rate and rhythm without rub or gallop Abd: Soft mildly tender to palpation the epigastric  region extending toward the left upper quadrant. There is no rebound. There is a fullness in the left upper quadrant insistent with the appearance seen on CT. Ext: No clubbing cyanosis or edema Skin: Warm dry Other: Cranial nerves II through XII grossly intact strength bilaterally equal and symmetric  Data: Lab Results  Component Value Date   WBC 5.4 03/19/2015   HGB 12.2 03/19/2015   HCT 37.3 03/19/2015   MCV 108.4* 03/19/2015   PLT 150 03/19/2015    Recent Labs Lab 03/18/15 1447 03/19/15 0601  HGB 13.9 12.2   Lab Results  Component Value Date   NA 143 03/19/2015   K 3.5 03/19/2015   CL 114* 03/19/2015   CO2 24 03/19/2015   BUN 13 03/19/2015   CREATININE 0.58 03/19/2015   Lab Results  Component Value Date   ALT 30 03/18/2015   AST 60* 03/18/2015   ALKPHOS 105 03/18/2015   BILITOT 0.8 03/18/2015   No results for input(s): APTT, INR, PTT in the last 168  hours. CBC Latest Ref Rng 03/19/2015 03/18/2015 12/27/2013  WBC 3.6 - 11.0 K/uL 5.4 8.6 5.4  Hemoglobin 12.0 - 16.0 g/dL 12.2 13.9 12.4  Hematocrit 35.0 - 47.0 % 37.3 41.4 38.1  Platelets 150 - 440 K/uL 150 220 172    STUDIES: Ct Abdomen Pelvis W Contrast  03/18/2015   CLINICAL DATA:  Abdominal pain with nausea and vomiting. Elevated lipase.  EXAM: CT ABDOMEN AND PELVIS WITH CONTRAST  TECHNIQUE: Multidetector CT imaging of the abdomen and pelvis was performed using the standard protocol following bolus administration of intravenous contrast.  CONTRAST:  5mL OMNIPAQUE IOHEXOL 300 MG/ML  SOLN  COMPARISON:  12/27/2013  FINDINGS: Lower chest:  Normal.  Hepatobiliary: There is increased intrahepatic biliary ductal dilatation. Common hepatic and proximal common bile duct are dilated slightly more prominently than on the prior study. There is an area of narrowing in the common bile duct is it enters the pancreatic head, most likely a stricture. No definable mass in the pancreatic head. The body of the pancreas appears normal.  There  is an oblong 2.4 x 1.8 x 1.5 cm fluid collection which appears to be with in the posterior wall of the body of the stomach best seen on image 21 of series 2. I suspect this represents a small pancreatic pseudocyst.  Pancreas: Pancreatic parenchyma appears normal. No dilatation of the pancreatic duct. Common bile duct is rapidly taper as it enters the pancreatic head. I suspect the patient has a stricture from previous pancreatitis.  Spleen: Normal.  Adrenals/Urinary Tract: Normal.  Stomach/Bowel: Probable pancreatic pseudocyst in the posterior wall of the body of the stomach. Otherwise normal.  Vascular/Lymphatic: Normal.  Reproductive: Normal.  Other: No free air or free fluid.  Musculoskeletal: Normal.  IMPRESSION: 1. Increased biliary ductal dilatation with rapid tapering of the common duct as it enters the pancreatic head. I suspect the patient has a distal common bile duct stricture. 2. New pancreatic pseudo cyst in the posterior wall of the body of the stomach.   Electronically Signed   By: Lorriane Shire M.D.   On: 03/18/2015 17:29   @IMAGES @  Assessment: 1. Patient presenting with epigastric pain in the setting of pancreatitis and reoccurrence of a pancreatic pseudocyst. There is some evidence of gastric outlet obstruction in the proximal duodenum. [place] presentation is very similar, nearly the same as, that in July of 2014 that necessitated transfer to [facility] for placement of a gastroduodenostomy tube for drainage of a pancreatic pseudocyst, dilation of the duodenum, and from records possible duodenotomy?  2)  Ongoing ETOH abuse.   Recommendations:1) Patient will likely require management similar to that 2 years ago, and these are not available at [facility]. I am concerned that with recurrence may increase the need for surgical intervention as well.     Recommned transfer to [facility] for further management of this recurrent issue. Discussed with Dr Volanda Napoleon.  Continue current.  Obtain UDS if not yet done.  Will  follow with you until transfer.   Thank you for the consult. Please call with questions or concerns.  Lollie Sails, MD  03/19/2015 2:26 PM

## 2015-03-20 ENCOUNTER — Inpatient Hospital Stay: Payer: Self-pay

## 2015-03-20 LAB — COMPREHENSIVE METABOLIC PANEL
ALT: 48 U/L (ref 14–54)
AST: 141 U/L — AB (ref 15–41)
Albumin: 2.6 g/dL — ABNORMAL LOW (ref 3.5–5.0)
Alkaline Phosphatase: 167 U/L — ABNORMAL HIGH (ref 38–126)
Anion gap: 5 (ref 5–15)
BUN: 9 mg/dL (ref 6–20)
CALCIUM: 7.9 mg/dL — AB (ref 8.9–10.3)
CO2: 22 mmol/L (ref 22–32)
Chloride: 113 mmol/L — ABNORMAL HIGH (ref 101–111)
Creatinine, Ser: 0.55 mg/dL (ref 0.44–1.00)
GFR calc non Af Amer: >60 mL/min (ref >60)
Glucose, Bld: 81 mg/dL (ref 65–99)
Potassium: 3 mmol/L — ABNORMAL LOW (ref 3.5–5.1)
SODIUM: 140 mmol/L (ref 135–145)
Total Bilirubin: 1.4 mg/dL — ABNORMAL HIGH (ref 0.3–1.2)
Total Protein: 6 g/dL — ABNORMAL LOW (ref 6.5–8.1)

## 2015-03-20 LAB — BASIC METABOLIC PANEL
Anion gap: 4 — ABNORMAL LOW (ref 5–15)
BUN: 5 mg/dL — AB (ref 6–20)
CO2: 24 mmol/L (ref 22–32)
Calcium: 7.9 mg/dL — ABNORMAL LOW (ref 8.9–10.3)
Chloride: 110 mmol/L (ref 101–111)
Creatinine, Ser: 0.58 mg/dL (ref 0.44–1.00)
GFR calc Af Amer: >60 mL/min (ref >60)
GLUCOSE: 127 mg/dL — AB (ref 65–99)
Potassium: 3.4 mmol/L — ABNORMAL LOW (ref 3.5–5.1)
Sodium: 138 mmol/L (ref 135–145)

## 2015-03-20 LAB — LIPASE, BLOOD: Lipase: 30 U/L (ref 22–51)

## 2015-03-20 LAB — CBC
HCT: 35.9 % (ref 35.0–47.0)
HEMOGLOBIN: 12 g/dL (ref 12.0–16.0)
MCH: 36 pg — AB (ref 26.0–34.0)
MCHC: 33.4 g/dL (ref 32.0–36.0)
MCV: 107.9 fL — ABNORMAL HIGH (ref 80.0–100.0)
Platelets: 127 10*3/uL — ABNORMAL LOW (ref 150–440)
RBC: 3.33 MIL/uL — AB (ref 3.80–5.20)
RDW: 13.4 % (ref 11.5–14.5)
WBC: 3.7 10*3/uL (ref 3.6–11.0)

## 2015-03-20 LAB — MAGNESIUM: Magnesium: 1.7 mg/dL (ref 1.7–2.4)

## 2015-03-20 MED ORDER — LORAZEPAM 2 MG/ML IJ SOLN
2.0000 mg | INTRAMUSCULAR | Status: DC | PRN
  Administered 2015-03-20 (×3): 2 mg via INTRAVENOUS
  Administered 2015-03-20: 3 mg via INTRAVENOUS
  Administered 2015-03-20 – 2015-03-21 (×4): 2 mg via INTRAVENOUS
  Filled 2015-03-20 (×3): qty 1
  Filled 2015-03-20: qty 2
  Filled 2015-03-20 (×4): qty 1

## 2015-03-20 MED ORDER — SODIUM CHLORIDE 0.9 % IJ SOLN
INTRAMUSCULAR | Status: AC
  Filled 2015-03-20: qty 10

## 2015-03-20 MED ORDER — VITAMIN B-1 100 MG PO TABS: 100.0000 mg | ORAL_TABLET | Freq: Every day | ORAL | Status: DC

## 2015-03-20 MED ORDER — FOLIC ACID 1 MG PO TABS: 1.0000 mg | ORAL_TABLET | Freq: Every day | ORAL | Status: DC

## 2015-03-20 MED ORDER — MORPHINE SULFATE 2 MG/ML IJ SOLN
INTRAMUSCULAR | Status: AC
  Filled 2015-03-20: qty 1

## 2015-03-20 MED ORDER — POTASSIUM CHLORIDE 10 MEQ/100ML IV SOLN
10.0000 meq | INTRAVENOUS | Status: AC
  Administered 2015-03-20 (×3): 10 meq via INTRAVENOUS
  Filled 2015-03-20 (×4): qty 100

## 2015-03-20 MED ORDER — ADULT MULTIVITAMIN W/MINERALS CH: 1.0000 | ORAL_TABLET | Freq: Every day | ORAL | Status: DC

## 2015-03-20 MED ORDER — MORPHINE SULFATE 2 MG/ML IJ SOLN
2.0000 mg | INTRAMUSCULAR | Status: AC
Start: 2015-03-20 — End: 2015-03-20
  Administered 2015-03-20: 2 mg via INTRAVENOUS

## 2015-03-20 NOTE — Progress Notes (Signed)
Patient remains agitated and hard to reason with, threatens to leave AMA every so often, patient given all the food she could have as she is on clear liquids and unhappy, morphine and ativan given as often as she can have for pain and agitation, alternates from tearful to agitated, report given to [facility], Therapist, sports. Psych consult still pending.

## 2015-03-20 NOTE — Progress Notes (Signed)
[place] [facility] [place] NAME: [name]    MR#:  [id number]  DATE OF BIRTH:  [birth year]  SUBJECTIVE:  CHIEF COMPLAINT:   Chief Complaint  Patient presents with  . Abdominal Pain   Continued abdominal pain with no improvement. Feels anxious and shaky, is withdrawing.  REVIEW OF SYSTEMS:   Review of Systems  Constitutional: Negative for fever.  Respiratory: Negative for shortness of breath.   Cardiovascular: Negative for chest pain and palpitations.  Gastrointestinal: Positive for nausea, vomiting and abdominal pain. Negative for diarrhea.  Genitourinary: Negative for dysuria.    DRUG ALLERGIES:   Allergies  Allergen Reactions  . Penicillins Rash    VITALS:  Blood pressure 177/79, pulse 92, temperature 98 F (36.7 C), temperature source Oral, resp. rate 20, height 5\' 4"  (1.626 m), weight 53.615 kg (118 lb 3.2 oz), SpO2 100 %.  PHYSICAL EXAMINATION:  GENERAL:  49 y.o.-year-old patient lying in the bed, uncomfortable EYES: Pupils equal, round, reactive to light and accommodation. No scleral icterus. Extraocular muscles intact.  HEENT: Head atraumatic, normocephalic. Oropharynx and nasopharynx clear.  NECK:  Supple, no jugular venous distention. No thyroid enlargement, no tenderness.  LUNGS: Normal breath sounds bilaterally, no wheezing, rales,rhonchi or crepitation. No use of accessory muscles of respiration.  CARDIOVASCULAR: S1, S2 normal. No murmurs, rubs, or gallops.  ABDOMEN: Soft, diffusely tender, most in the upper quadrants and epigastric area, nondistended. Bowel sounds present. No organomegaly or mass.  EXTREMITIES: No pedal edema, cyanosis, or clubbing.  NEUROLOGIC: Cranial nerves II through XII are intact. Muscle strength 5/5 in all extremities. Sensation intact. Gait not checked.  PSYCHIATRIC: The patient is alert and oriented x 3.  SKIN: No obvious rash, lesion, or ulcer.    LABORATORY PANEL:   CBC  Recent  Labs Lab 03/20/15 0502  WBC 3.7  HGB 12.0  HCT 35.9  PLT 127*   ------------------------------------------------------------------------------------------------------------------  Chemistries   Recent Labs Lab 03/20/15 0502  NA 140  K 3.0*  CL 113*  CO2 22  GLUCOSE 81  BUN 9  CREATININE 0.55  CALCIUM 7.9*  MG 1.7  AST 141*  ALT 48  ALKPHOS 167*  BILITOT 1.4*   ------------------------------------------------------------------------------------------------------------------  Cardiac Enzymes No results for input(s): TROPONINI in the last 168 hours. ------------------------------------------------------------------------------------------------------------------  RADIOLOGY:  Ct Abdomen Pelvis W Contrast  03/18/2015   CLINICAL DATA:  Abdominal pain with nausea and vomiting. Elevated lipase.  EXAM: CT ABDOMEN AND PELVIS WITH CONTRAST  TECHNIQUE: Multidetector CT imaging of the abdomen and pelvis was performed using the standard protocol following bolus administration of intravenous contrast.  CONTRAST:  70mL OMNIPAQUE IOHEXOL 300 MG/ML  SOLN  COMPARISON:  12/27/2013  FINDINGS: Lower chest:  Normal.  Hepatobiliary: There is increased intrahepatic biliary ductal dilatation. Common hepatic and proximal common bile duct are dilated slightly more prominently than on the prior study. There is an area of narrowing in the common bile duct is it enters the pancreatic head, most likely a stricture. No definable mass in the pancreatic head. The body of the pancreas appears normal.  There is an oblong 2.4 x 1.8 x 1.5 cm fluid collection which appears to be with in the posterior wall of the body of the stomach best seen on image 21 of series 2. I suspect this represents a small pancreatic pseudocyst.  Pancreas: Pancreatic parenchyma appears normal. No dilatation of the pancreatic duct. Common bile duct is rapidly taper as it enters the pancreatic head. I  suspect the patient has a stricture from  previous pancreatitis.  Spleen: Normal.  Adrenals/Urinary Tract: Normal.  Stomach/Bowel: Probable pancreatic pseudocyst in the posterior wall of the body of the stomach. Otherwise normal.  Vascular/Lymphatic: Normal.  Reproductive: Normal.  Other: No free air or free fluid.  Musculoskeletal: Normal.  IMPRESSION: 1. Increased biliary ductal dilatation with rapid tapering of the common duct as it enters the pancreatic head. I suspect the patient has a distal common bile duct stricture. 2. New pancreatic pseudo cyst in the posterior wall of the body of the stomach.   Electronically Signed   By: Lorriane Shire M.D.   On: 03/18/2015 17:29    EKG:   Orders placed or performed during the hospital encounter of 03/18/15  . ED EKG  . ED EKG  . EKG 12-Lead  . EKG 12-Lead  . EKG 12-Lead  . EKG 12-Lead    ASSESSMENT AND PLAN:   Principal Problem:   Chronic pancreatitis Active Problems:   Pancreatic pseudocyst   Alcohol abuse   Hypothyroidism   Depression   Abdominal pain, epigastric  #1 acute on chronic pancreatitis with pancreatic pseudocyst: - Pain level is stable, labs have improved - Plan is for transportation to [facility] tomorrow to have endoscopy with ultrasound and possible drainage of pancreatic pseudocyst by Dr. Okey Dupre and then return to [facility] -Will need transport via [facility] - Needs to have abdominal CT image disc with her at the time of transport - Dr. Donnella Sham following here at [facility] for clear fluids today as she is requesting, and by mouth after midnight  #2 alcohol abuse with high risk for seizure versus DTs: - Increased CIWAcoverage to SDU level coverage - Will need to be assessed prior to transportation for signs of withdrawal  #3 hypokalemia - Replace potassium, recheck this afternoon  #4 hypertension: Due to alcohol withdrawal. Continue with CIWA protocol  All the records are reviewed and case discussed with Care Management/Social  Workerr. Management plans discussed with the patient, family and they are in agreement.  CODE STATUS: full  TOTAL TIME TAKING CARE OF THIS PATIENT: 45 minutes.   POSSIBLE D/C IN 2-3 DAYS, DEPENDING ON CLINICAL CONDITION.  Greater than 50% of time spent in care coordination and counseling.  Myrtis Ser M.D on 03/20/2015 at 3:27 PM  Between 7am to 6pm - Pager - [phone]  After 6pm go to [email or web address] - password [email or web address] [facility]  [place] [facility]  Office  [phone]  CC: Primary care physician; No primary care provider on file.

## 2015-03-20 NOTE — Consult Note (Signed)
Subjective: Patient seen for a pancreatitis, bile duct stricture, pancreatic pseudocyst. Patient continues with epigastric pain however nausea has improved. Still nothing by mouth. She is passing flatus, no stool.  Objective: Vital signs in last 24 hours: Temp:  [98 F (36.7 C)-98.5 F (36.9 C)] 98 F (36.7 C) (07/05 1100) Pulse Rate:  [84-92] 92 (07/05 1100) Resp:  [20-25] 20 (07/05 1100) BP: (129-177)/(76-102) 177/79 mmHg (07/05 1100) SpO2:  [99 %-100 %] 100 % (07/05 1100) Blood pressure 177/79, pulse 92, temperature 98 F (36.7 C), temperature source Oral, resp. rate 20, height 5\' 4"  (1.626 m), weight 53.615 kg (118 lb 3.2 oz), SpO2 100 %.   Intake/Output from previous day: 07/04 0701 - 07/05 0700 In: 1991.7 [P.O.:1500; I.V.:491.7] Out: 1900 [Urine:1900]  Intake/Output this shift: Total I/O In: -  Out: 800 [Urine:800]   General appearance:  49 year old female appears older than her stated age Resp: Clear to auscultation Cardio:  Regular rate and rhythm without rub or gallop GI:  Soft generally however firmness in the epigastric region. She is tender in the epigastric region. She is nontender otherwise. Bowel sounds are positive but distant and few Extremities:  No clubbing cyanosis or edema   Lab Results: Results for orders placed or performed during the hospital encounter of 03/18/15 (from the past 24 hour(s))  Urine Drug Screen, Qualitative ([facility] only)     Status: Abnormal   Collection Time: 03/19/15  2:18 PM  Result Value Ref Range   Tricyclic, Ur Screen NONE DETECTED NONE DETECTED   Amphetamines, Ur Screen NONE DETECTED NONE DETECTED   MDMA (Ecstasy)Ur Screen NONE DETECTED NONE DETECTED   Cocaine Metabolite,Ur [place] POSITIVE (A) NONE DETECTED   Opiate, Ur Screen POSITIVE (A) NONE DETECTED   Phencyclidine (PCP) Ur S NONE DETECTED NONE DETECTED   Cannabinoid 50 Ng, Ur  POSITIVE (A) NONE DETECTED   Barbiturates, Ur Screen NONE DETECTED NONE DETECTED   Benzodiazepine,  Ur Scrn POSITIVE (A) NONE DETECTED   Methadone Scn, Ur NONE DETECTED NONE DETECTED  Lipase, blood     Status: None   Collection Time: 03/20/15  5:02 AM  Result Value Ref Range   Lipase 30 22 - 51 U/L  Comprehensive metabolic panel     Status: Abnormal   Collection Time: 03/20/15  5:02 AM  Result Value Ref Range   Sodium 140 135 - 145 mmol/L   Potassium 3.0 (L) 3.5 - 5.1 mmol/L   Chloride 113 (H) 101 - 111 mmol/L   CO2 22 22 - 32 mmol/L   Glucose, Bld 81 65 - 99 mg/dL   BUN 9 6 - 20 mg/dL   Creatinine, Ser 0.55 0.44 - 1.00 mg/dL   Calcium 7.9 (L) 8.9 - 10.3 mg/dL   Total Protein 6.0 (L) 6.5 - 8.1 g/dL   Albumin 2.6 (L) 3.5 - 5.0 g/dL   AST 141 (H) 15 - 41 U/L   ALT 48 14 - 54 U/L   Alkaline Phosphatase 167 (H) 38 - 126 U/L   Total Bilirubin 1.4 (H) 0.3 - 1.2 mg/dL   GFR calc non Af Amer >60 >60 mL/min   GFR calc Af Amer >60 >60 mL/min   Anion gap 5 5 - 15  CBC     Status: Abnormal   Collection Time: 03/20/15  5:02 AM  Result Value Ref Range   WBC 3.7 3.6 - 11.0 K/uL   RBC 3.33 (L) 3.80 - 5.20 MIL/uL   Hemoglobin 12.0 12.0 - 16.0 g/dL   HCT  35.9 35.0 - 47.0 %   MCV 107.9 (H) 80.0 - 100.0 fL   MCH 36.0 (H) 26.0 - 34.0 pg   MCHC 33.4 32.0 - 36.0 g/dL   RDW 13.4 11.5 - 14.5 %   Platelets 127 (L) 150 - 440 K/uL  Magnesium     Status: None   Collection Time: 03/20/15  5:02 AM  Result Value Ref Range   Magnesium 1.7 1.7 - 2.4 mg/dL      Recent Labs  03/18/15 1447 03/19/15 0601 03/20/15 0502  WBC 8.6 5.4 3.7  HGB 13.9 12.2 12.0  HCT 41.4 37.3 35.9  PLT 220 150 127*   BMET  Recent Labs  03/19/15 0601 03/19/15 1401 03/20/15 0502  NA 143 138 140  K 3.5 3.1* 3.0*  CL 114* 108 113*  CO2 24 23 22   GLUCOSE 80 83 81  BUN 13 14 9   CREATININE 0.58 0.52 0.55  CALCIUM 7.7* 7.5* 7.9*   LFT  Recent Labs  03/20/15 0502  PROT 6.0*  ALBUMIN 2.6*  AST 141*  ALT 48  ALKPHOS 167*  BILITOT 1.4*   PT/INR No results for input(s): LABPROT, INR in the last 72  hours. Hepatitis Panel No results for input(s): HEPBSAG, HCVAB, HEPAIGM, HEPBIGM in the last 72 hours. C-Diff No results for input(s): CDIFFTOX in the last 72 hours. No results for input(s): CDIFFPCR in the last 72 hours.   Studies/Results: Ct Abdomen Pelvis W Contrast  03/18/2015   CLINICAL DATA:  Abdominal pain with nausea and vomiting. Elevated lipase.  EXAM: CT ABDOMEN AND PELVIS WITH CONTRAST  TECHNIQUE: Multidetector CT imaging of the abdomen and pelvis was performed using the standard protocol following bolus administration of intravenous contrast.  CONTRAST:  35mL OMNIPAQUE IOHEXOL 300 MG/ML  SOLN  COMPARISON:  12/27/2013  FINDINGS: Lower chest:  Normal.  Hepatobiliary: There is increased intrahepatic biliary ductal dilatation. Common hepatic and proximal common bile duct are dilated slightly more prominently than on the prior study. There is an area of narrowing in the common bile duct is it enters the pancreatic head, most likely a stricture. No definable mass in the pancreatic head. The body of the pancreas appears normal.  There is an oblong 2.4 x 1.8 x 1.5 cm fluid collection which appears to be with in the posterior wall of the body of the stomach best seen on image 21 of series 2. I suspect this represents a small pancreatic pseudocyst.  Pancreas: Pancreatic parenchyma appears normal. No dilatation of the pancreatic duct. Common bile duct is rapidly taper as it enters the pancreatic head. I suspect the patient has a stricture from previous pancreatitis.  Spleen: Normal.  Adrenals/Urinary Tract: Normal.  Stomach/Bowel: Probable pancreatic pseudocyst in the posterior wall of the body of the stomach. Otherwise normal.  Vascular/Lymphatic: Normal.  Reproductive: Normal.  Other: No free air or free fluid.  Musculoskeletal: Normal.  IMPRESSION: 1. Increased biliary ductal dilatation with rapid tapering of the common duct as it enters the pancreatic head. I suspect the patient has a distal common  bile duct stricture. 2. New pancreatic pseudo cyst in the posterior wall of the body of the stomach.   Electronically Signed   By: Lorriane Shire M.D.   On: 03/18/2015 17:29    Scheduled Inpatient Medications:   . DULoxetine  60 mg Oral Daily  . enoxaparin (LOVENOX) injection  40 mg Subcutaneous Q24H  . folic acid  1 mg Oral Daily  . levothyroxine  250 mcg Oral QAC  breakfast  . LORazepam  0-4 mg Oral Q6H   Followed by  . LORazepam  0-4 mg Oral Q12H  . multivitamin with minerals  1 tablet Oral Daily  . sodium chloride  3 mL Intravenous Q12H  . sodium chloride      . sodium chloride      . thiamine  100 mg Oral Daily   Or  . thiamine  100 mg Intravenous Daily    Continuous Inpatient Infusions:     PRN Inpatient Medications:  acetaminophen **OR** acetaminophen, LORazepam, morphine injection, nicotine, ondansetron **OR** ondansetron (ZOFRAN) IV  Miscellaneous:   Assessment:  1. 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 Ongoing substance/alcohol abuse. No positive UDS for cocaine metabolites and cannabinoids.  Plan:  1.plans noted for transfer to [facility] tomorrow for procedure and then return. Discussed with Dr. Volanda Napoleon. Following.  Lollie Sails MD 03/20/2015, 2:16 PM

## 2015-03-20 NOTE — Progress Notes (Signed)
   03/20/15 2100  Clinical Encounter Type  Visited With Patient  Visit Type Initial  Spiritual Encounters  Spiritual Needs Emotional  Stress Factors  Patient Stress Factors Health changes   Faith tradition: Believes in God but no faith path chosen Status: alert and oriented, emotional grief, detoxing Family: none present. She says she has a boyfriend and children Visit Assessment: The patient has been heavily grieving. She says her boyfriend of five years is very supportive. She said he just left. The patient is complaining about being hungry and being in pain due to pancreatitis. Her sentiments were referred to the RN who says her jello was scheduled around 9. The patient allowed the chaplain to pray for her. The patient is complaining that she does not have health insurance.  Chaplains can be reached via pager [phone] or online request

## 2015-03-20 NOTE — Progress Notes (Signed)
At 2300 spoke with Dr Lavetta Nielsen, pt complaining of excrutiating abdominal pain at 9/10.  Order given. At 0500 spoke with Dr Marcille Blanco, pt complaining again of excrutiating abdominal pain of 9/10. Order given.  Lynnda Shields, RN

## 2015-03-21 ENCOUNTER — Encounter: Payer: Self-pay | Admitting: *Deleted

## 2015-03-21 ENCOUNTER — Ambulatory Visit (HOSPITAL_COMMUNITY)
Admission: AD | Admit: 2015-03-21 | Discharge: 2015-03-21 | Disposition: A | Discharge status: Home / Self Care | Payer: MEDICAID | Source: Other Acute Inpatient Hospital | Attending: Family Medicine | Admitting: Family Medicine

## 2015-03-21 ENCOUNTER — Inpatient Hospital Stay
Admission: AD | Admit: 2015-03-21 | Discharge: 2015-03-26 | DRG: 380 | Disposition: A | Discharge status: Home / Self Care | Payer: Self-pay | Source: Ambulatory Visit | Attending: Internal Medicine | Admitting: Internal Medicine

## 2015-03-21 DIAGNOSIS — K852 Alcohol induced acute pancreatitis: Secondary | ICD-10-CM | POA: Diagnosis present

## 2015-03-21 DIAGNOSIS — F17210 Nicotine dependence, cigarettes, uncomplicated: Secondary | ICD-10-CM | POA: Diagnosis present

## 2015-03-21 DIAGNOSIS — K860 Alcohol-induced chronic pancreatitis: Secondary | ICD-10-CM | POA: Diagnosis present

## 2015-03-21 DIAGNOSIS — I10 Essential (primary) hypertension: Secondary | ICD-10-CM | POA: Diagnosis present

## 2015-03-21 DIAGNOSIS — K311 Adult hypertrophic pyloric stenosis: Principal | ICD-10-CM | POA: Diagnosis present

## 2015-03-21 DIAGNOSIS — K863 Pseudocyst of pancreas: Secondary | ICD-10-CM | POA: Diagnosis present

## 2015-03-21 DIAGNOSIS — Z800 Family history of malignant neoplasm of digestive organs: Secondary | ICD-10-CM

## 2015-03-21 DIAGNOSIS — E876 Hypokalemia: Secondary | ICD-10-CM | POA: Diagnosis present

## 2015-03-21 DIAGNOSIS — F1024 Alcohol dependence with alcohol-induced mood disorder: Secondary | ICD-10-CM | POA: Diagnosis present

## 2015-03-21 DIAGNOSIS — E039 Hypothyroidism, unspecified: Secondary | ICD-10-CM | POA: Diagnosis present

## 2015-03-21 DIAGNOSIS — F3289 Other specified depressive episodes: Secondary | ICD-10-CM

## 2015-03-21 DIAGNOSIS — K859 Acute pancreatitis without necrosis or infection, unspecified: Secondary | ICD-10-CM | POA: Diagnosis present

## 2015-03-21 DIAGNOSIS — F10239 Alcohol dependence with withdrawal, unspecified: Secondary | ICD-10-CM | POA: Diagnosis present

## 2015-03-21 DIAGNOSIS — F1020 Alcohol dependence, uncomplicated: Secondary | ICD-10-CM | POA: Diagnosis present

## 2015-03-21 DIAGNOSIS — Z833 Family history of diabetes mellitus: Secondary | ICD-10-CM

## 2015-03-21 DIAGNOSIS — J45909 Unspecified asthma, uncomplicated: Secondary | ICD-10-CM | POA: Diagnosis present

## 2015-03-21 DIAGNOSIS — R109 Unspecified abdominal pain: Secondary | ICD-10-CM

## 2015-03-21 DIAGNOSIS — B1920 Unspecified viral hepatitis C without hepatic coma: Secondary | ICD-10-CM | POA: Diagnosis present

## 2015-03-21 DIAGNOSIS — J449 Chronic obstructive pulmonary disease, unspecified: Secondary | ICD-10-CM | POA: Diagnosis present

## 2015-03-21 LAB — COMPREHENSIVE METABOLIC PANEL
ALBUMIN: 2.8 g/dL — AB (ref 3.5–5.0)
ALT: 39 U/L (ref 14–54)
ANION GAP: 4 — AB (ref 5–15)
AST: 59 U/L — AB (ref 15–41)
Alkaline Phosphatase: 154 U/L — ABNORMAL HIGH (ref 38–126)
BILIRUBIN TOTAL: 0.8 mg/dL (ref 0.3–1.2)
BUN: <5 mg/dL — ABNORMAL LOW (ref 6–20)
CALCIUM: 8.2 mg/dL — AB (ref 8.9–10.3)
CHLORIDE: 111 mmol/L (ref 101–111)
CO2: 26 mmol/L (ref 22–32)
CREATININE: 0.48 mg/dL (ref 0.44–1.00)
GFR calc Af Amer: >60 mL/min (ref >60)
Glucose, Bld: 96 mg/dL (ref 65–99)
Potassium: 3.1 mmol/L — ABNORMAL LOW (ref 3.5–5.1)
Sodium: 141 mmol/L (ref 135–145)
Total Protein: 6.2 g/dL — ABNORMAL LOW (ref 6.5–8.1)

## 2015-03-21 LAB — CBC
HEMATOCRIT: 33.8 % — AB (ref 35.0–47.0)
Hemoglobin: 11.3 g/dL — ABNORMAL LOW (ref 12.0–16.0)
MCH: 36 pg — ABNORMAL HIGH (ref 26.0–34.0)
MCHC: 33.5 g/dL (ref 32.0–36.0)
MCV: 107.5 fL — ABNORMAL HIGH (ref 80.0–100.0)
Platelets: 141 10*3/uL — ABNORMAL LOW (ref 150–440)
RBC: 3.15 MIL/uL — ABNORMAL LOW (ref 3.80–5.20)
RDW: 13 % (ref 11.5–14.5)
WBC: 3.8 10*3/uL (ref 3.6–11.0)

## 2015-03-21 LAB — LIPASE, BLOOD: Lipase: 30 U/L (ref 22–51)

## 2015-03-21 MED ORDER — THIAMINE HCL 100 MG/ML IJ SOLN: 100.0000 mg | Freq: Every day | INTRAMUSCULAR | Status: DC

## 2015-03-21 MED ORDER — HYDRALAZINE HCL 20 MG/ML IJ SOLN: 10.0000 mg | Freq: Four times a day (QID) | INTRAMUSCULAR | Status: DC | PRN

## 2015-03-21 MED ORDER — ACETAMINOPHEN 650 MG RE SUPP: 650.0000 mg | Freq: Four times a day (QID) | RECTAL | Status: DC | PRN

## 2015-03-21 MED ORDER — POTASSIUM CHLORIDE CRYS ER 20 MEQ PO TBCR: 40.0000 meq | EXTENDED_RELEASE_TABLET | Freq: Once | ORAL | Status: DC

## 2015-03-21 MED ORDER — HYDRALAZINE HCL 20 MG/ML IJ SOLN
10.0000 mg | Freq: Once | INTRAMUSCULAR | Status: AC
  Administered 2015-03-21: 10 mg via INTRAVENOUS
  Filled 2015-03-21: qty 1

## 2015-03-21 MED ORDER — SODIUM CHLORIDE 0.9 % IJ SOLN
INTRAMUSCULAR | Status: AC
Start: 2015-03-21 — End: 2015-03-21
  Administered 2015-03-21: 10 mL
  Filled 2015-03-21: qty 10

## 2015-03-21 MED ORDER — ONDANSETRON HCL 4 MG PO TABS
4.0000 mg | ORAL_TABLET | Freq: Four times a day (QID) | ORAL | Status: DC | PRN
  Administered 2015-03-23: 4 mg via ORAL
  Filled 2015-03-21 (×2): qty 1

## 2015-03-21 MED ORDER — MORPHINE SULFATE 2 MG/ML IJ SOLN
2.0000 mg | INTRAMUSCULAR | Status: DC | PRN
  Administered 2015-03-21: 2 mg via INTRAVENOUS
  Filled 2015-03-21: qty 1

## 2015-03-21 MED ORDER — LORAZEPAM 2 MG/ML IJ SOLN
1.0000 mg | Freq: Four times a day (QID) | INTRAMUSCULAR | Status: AC | PRN
  Administered 2015-03-21 – 2015-03-24 (×3): 1 mg via INTRAVENOUS
  Filled 2015-03-21 (×3): qty 1

## 2015-03-21 MED ORDER — ONDANSETRON HCL 4 MG/2ML IJ SOLN
4.0000 mg | Freq: Four times a day (QID) | INTRAMUSCULAR | Status: DC | PRN
Start: 2015-03-21 — End: 2015-03-26

## 2015-03-21 MED ORDER — VITAMIN B-1 100 MG PO TABS
100.0000 mg | ORAL_TABLET | Freq: Every day | ORAL | Status: DC
  Administered 2015-03-21 – 2015-03-26 (×6): 100 mg via ORAL
  Filled 2015-03-21 (×6): qty 1

## 2015-03-21 MED ORDER — ACETAMINOPHEN 325 MG PO TABS: 650.0000 mg | ORAL_TABLET | Freq: Four times a day (QID) | ORAL | Status: DC | PRN

## 2015-03-21 MED ORDER — HEPARIN SODIUM (PORCINE) 5000 UNIT/ML IJ SOLN
5000.0000 [IU] | Freq: Three times a day (TID) | INTRAMUSCULAR | Status: DC
  Administered 2015-03-21 – 2015-03-25 (×10): 5000 [IU] via SUBCUTANEOUS
  Filled 2015-03-21 (×11): qty 1

## 2015-03-21 MED ORDER — MORPHINE SULFATE 2 MG/ML IJ SOLN
2.0000 mg | INTRAMUSCULAR | Status: DC | PRN
  Administered 2015-03-21 – 2015-03-23 (×8): 2 mg via INTRAVENOUS
  Filled 2015-03-21 (×9): qty 1

## 2015-03-21 MED ORDER — ALBUTEROL SULFATE (2.5 MG/3ML) 0.083% IN NEBU: 2.5000 mg | INHALATION_SOLUTION | Freq: Four times a day (QID) | RESPIRATORY_TRACT | Status: DC | PRN

## 2015-03-21 MED ORDER — FOLIC ACID 1 MG PO TABS
1.0000 mg | ORAL_TABLET | Freq: Every day | ORAL | Status: DC
  Administered 2015-03-21 – 2015-03-26 (×6): 1 mg via ORAL
  Filled 2015-03-21 (×6): qty 1

## 2015-03-21 MED ORDER — ADULT MULTIVITAMIN W/MINERALS CH
1.0000 | ORAL_TABLET | Freq: Every day | ORAL | Status: DC
  Administered 2015-03-21 – 2015-03-26 (×6): 1 via ORAL
  Filled 2015-03-21 (×6): qty 1

## 2015-03-21 MED ORDER — DULOXETINE HCL 60 MG PO CPEP
60.0000 mg | ORAL_CAPSULE | Freq: Every day | ORAL | Status: DC
  Administered 2015-03-21 – 2015-03-26 (×6): 60 mg via ORAL
  Filled 2015-03-21 (×6): qty 1

## 2015-03-21 MED ORDER — SODIUM CHLORIDE 0.9 % IJ SOLN
INTRAMUSCULAR | Status: AC
  Administered 2015-03-21: 10 mL
  Filled 2015-03-21: qty 10

## 2015-03-21 MED ORDER — LEVOTHYROXINE SODIUM 100 MCG PO TABS
200.0000 ug | ORAL_TABLET | Freq: Every day | ORAL | Status: DC
  Administered 2015-03-22 – 2015-03-26 (×5): 200 ug via ORAL
  Filled 2015-03-21 (×5): qty 2

## 2015-03-21 MED ORDER — LEVOTHYROXINE SODIUM 50 MCG PO TABS
50.0000 ug | ORAL_TABLET | Freq: Every day | ORAL | Status: DC
  Administered 2015-03-22 – 2015-03-26 (×5): 50 ug via ORAL
  Filled 2015-03-21 (×5): qty 1

## 2015-03-21 MED ORDER — LORAZEPAM 1 MG PO TABS
1.0000 mg | ORAL_TABLET | Freq: Four times a day (QID) | ORAL | Status: AC | PRN
  Administered 2015-03-24 (×2): 1 mg via ORAL
  Filled 2015-03-21 (×2): qty 1

## 2015-03-21 NOTE — Care Management (Signed)
Patient is for transfer today to [facility] for I & D of pancreatic pseudocyst and then return to [facility].  Spoke with [facility] admissions and attending.  Patient will require readmission.  Attending does not feel patient will require to come back to 2A but another unit.  Will have attending call intake and plan for "direct admit" upon return and attending will "readmit the patient" with new orders, H/P.  The "admissions will be combined on the back end per [facility] and [employer].  Updated team lead.  Have asked for room assignment in advance so when patient returns can go directly to room.  Notified ED caremanager.

## 2015-03-21 NOTE — Progress Notes (Signed)
carelink arrived.  Packed given to carelink.  Ativan and morphine given.  Patient placed on telemetry for EMS ride.

## 2015-03-21 NOTE — Discharge Instructions (Signed)
°  DIET:  Npo   DISCHARGE CONDITION:  Good  ACTIVITY:  Activity as tolerated  OXYGEN:  Home Oxygen: No.   Oxygen Delivery: room air  DISCHARGE LOCATION:  [place]    ADDITIONAL DISCHARGE INSTRUCTION:   If you experience worsening of your admission symptoms, develop shortness of breath, life threatening emergency, suicidal or homicidal thoughts you must seek medical attention immediately by calling 911 or calling your MD immediately  if symptoms less severe.  You Must read complete instructions/literature along with all the possible adverse reactions/side effects for all the Medicines you take and that have been prescribed to you. Take any new Medicines after you have completely understood and accpet all the possible adverse reactions/side effects.   Please note  You were cared for by a hospitalist during your hospital stay. If you have any questions about your discharge medications or the care you received while you were in the hospital after you are discharged, you can call the unit and asked to speak with the hospitalist on call if the hospitalist that took care of you is not available. Once you are discharged, your primary care physician will handle any further medical issues. Please note that NO REFILLS for any discharge medications will be authorized once you are discharged, as it is imperative that you return to your primary care physician (or establish a relationship with a primary care physician if you do not have one) for your aftercare needs so that they can reassess your need for medications and monitor your lab values.

## 2015-03-21 NOTE — Discharge Summary (Signed)
[name], 49 y.o., DOB 11/05/1965, MRN [id number]. Admission date: 03/18/2015 Discharge Date 03/21/2015 Primary MD No primary care provider on file. 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 female with a history of substance abuse scented to the emergency room  with problems of nausea vomiting and abdominal  pain. She doesn't remember a lot about past couple of days. As a history of alcohol abuse and states that over the past couple of much she has been drinking alcohol quite heavily. She has a history of chronic pancreatitis. She has an extensive history in regards to her pancreatitis. Multiple episodes of pancreatitis over the past number of years. 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 She was transferred from that hospitalization to [facility] as she underwent ERCP with a gastroduodenostomy, a transmural stent from the stomach vault into the pseudocyst or drainage. She has had MRCP in the past that showed a similar dilatation of the intra-and extrahepatic bile ducts. On this hospitalization she has had a CT scan showing a recurrent pancreatic pseudocyst with evidence of a possible distal common bile duct stricture the level of the pancreatic head. He also has a history of gastric outlet obstruction/partial has had screens in the past positive for cocaine as well as cannabinoids as well as alcohol use/abuse. She was seen by Dr. Donnella Sham who recommend patient be transferred to [facility] for drainage of the pseudocyst. Dr. Volanda Napoleon called [facility] they  reported they have no bed for days and recommend drainage of the cyst. Pt was arranged for transferred of the psedocyst drainge with return back to our hospital. She still has abdominal pain. 3           Consults  GI  Significant Tests:  See full reports for all details    Ct Abdomen Pelvis W Contrast  03/18/2015   CLINICAL DATA:  Abdominal pain with nausea and vomiting. Elevated lipase.  EXAM: CT ABDOMEN AND PELVIS WITH CONTRAST  TECHNIQUE: Multidetector CT imaging of the abdomen and pelvis was performed using the standard protocol following bolus administration of intravenous contrast.  CONTRAST:  55mL OMNIPAQUE IOHEXOL 300 MG/ML  SOLN  COMPARISON:  12/27/2013  FINDINGS: Lower chest:  Normal.  Hepatobiliary: There is increased intrahepatic biliary ductal dilatation. Common hepatic and proximal common bile duct are dilated slightly more prominently than on the prior study. There is an area of narrowing in the common bile duct is it enters the pancreatic head, most likely a stricture. No definable mass in the pancreatic head. The body of the pancreas appears normal.  There is an oblong 2.4 x 1.8 x 1.5 cm fluid collection which appears to be with in the posterior wall of the body of the stomach best seen on image 21 of series 2. I suspect this represents a small pancreatic pseudocyst.  Pancreas: Pancreatic parenchyma appears normal. No dilatation of the pancreatic duct. Common bile duct is rapidly taper as it enters the pancreatic head. I suspect the patient has a stricture from previous  pancreatitis.  Spleen: Normal.  Adrenals/Urinary Tract: Normal.  Stomach/Bowel: Probable pancreatic pseudocyst in the posterior wall of the body of the stomach. Otherwise normal.  Vascular/Lymphatic: Normal.  Reproductive: Normal.  Other: No free air or free fluid.  Musculoskeletal: Normal.  IMPRESSION: 1. Increased biliary ductal dilatation with rapid tapering of the common duct as it enters the pancreatic head. I  suspect the patient has a distal common bile duct stricture. 2. New pancreatic pseudo cyst in the posterior wall of the body of the stomach.   Electronically Signed   By: Lorriane Shire M.D.   On: 03/18/2015 17:29   Dg Chest Port 1 View  03/20/2015   CLINICAL DATA:  PICC line placement  EXAM: PORTABLE CHEST - 1 VIEW  COMPARISON:  Chest x-ray of 03/15/2012  FINDINGS: Unfortunately the tip of the PICC line is overlain by an electrode lead. However the tip of the right PICC line appears to extend to the low SVC, just above the expected RA junction. No pneumothorax is seen. The lungs are clear. The heart is borderline enlarged and stable.  IMPRESSION: Tip of right PICC line is difficult to visualize as noted above, projecting over the lower SVC.   Electronically Signed   By: Ivar Drape M.D.   On: 03/20/2015 16:59       Today   Subjective:   [name]  Continues to complain of abdominal pain, wants to eat  Objective:   Blood pressure 163/96, pulse 77, temperature 97.4 F (36.3 C), temperature source Oral, resp. rate 20, height 5\' 4"  (1.626 m), weight 53.615 kg (118 lb 3.2 oz), SpO2 95 %.  .  Intake/Output Summary (Last 24 hours) at 03/21/15 0829 Last data filed at 03/21/15 0814  Gross per 24 hour  Intake     10 ml  Output   3350 ml  Net  -3340 ml    Exam VITAL SIGNS: Blood pressure 163/96, pulse 77, temperature 97.4 F (36.3 C), temperature source Oral, resp. rate 20, height 5\' 4"  (1.626 m), weight 53.615 kg (118 lb 3.2 oz), SpO2 95 %.  GENERAL:  49 y.o.-year-old patient lying in the bed with no acute distress.  EYES: Pupils equal, round, reactive to light and accommodation. No scleral icterus. Extraocular muscles intact.  HEENT: Head atraumatic, normocephalic. Oropharynx and nasopharynx clear.  NECK:  Supple, no jugular venous distention. No thyroid enlargement, no tenderness.  LUNGS: Normal breath sounds bilaterally, no wheezing, rales,rhonchi or crepitation. No use of accessory  muscles of respiration.  CARDIOVASCULAR: S1, S2 normal. No murmurs, rubs, or gallops.  ABDOMEN: Soft, + epigastric tenderness,  nondistended. Bowel sounds present. No organomegaly or mass.  EXTREMITIES: No pedal edema, cyanosis, or clubbing.  NEUROLOGIC: Cranial nerves II through XII are intact. Muscle strength 5/5 in all extremities. Sensation intact. Gait not checked.  PSYCHIATRIC: The patient is alert and oriented x 3.  SKIN: No obvious rash, lesion, or ulcer.   Data Review     CBC w Diff: Lab Results  Component Value Date   WBC 3.8 03/21/2015   WBC 5.4 12/27/2013   HGB 11.3* 03/21/2015   HGB 12.4 12/27/2013   HCT 33.8* 03/21/2015   HCT 38.1 12/27/2013   PLT 141* 03/21/2015   PLT 172 12/27/2013   LYMPHOPCT 26 03/18/2015   LYMPHOPCT 10.9 03/18/2013   MONOPCT 6 03/18/2015   MONOPCT 6.9 03/18/2013   EOSPCT 1 03/18/2015   EOSPCT 0.3 03/18/2013   BASOPCT 1 03/18/2015   BASOPCT 0.2 03/18/2013  CMP: Lab Results  Component Value Date   NA 141 03/21/2015   NA 137 12/27/2013   K 3.1* 03/21/2015   K 3.4* 12/27/2013   CL 111 03/21/2015   CL 104 12/27/2013   CO2 26 03/21/2015   CO2 27 12/27/2013   BUN <5* 03/21/2015   BUN 13 12/27/2013   CREATININE 0.48 03/21/2015   CREATININE 0.66 12/27/2013   PROT 6.2* 03/21/2015   PROT 8.4* 12/27/2013   ALBUMIN 2.8* 03/21/2015   ALBUMIN 3.3* 12/27/2013   BILITOT 0.8 03/21/2015   ALKPHOS 154* 03/21/2015   ALKPHOS 147* 12/27/2013   AST 59* 03/21/2015   AST 470* 12/27/2013   ALT 39 03/21/2015   ALT 356* 12/27/2013  .  Micro Results No results found for this or any previous visit (from the past 240 hour(s)).      Code Status Orders        Start     Ordered   03/18/15 2026  Full code   Continuous     03/18/15 2026            Discharge Medications     Medication List    TAKE these medications        albuterol 108 (90 BASE) MCG/ACT inhaler  Commonly known as:  PROVENTIL HFA;VENTOLIN HFA  Inhale 2 puffs into  the lungs every 6 (six) hours as needed for wheezing or shortness of breath.     clonazePAM 1 MG tablet  Commonly known as:  KLONOPIN  Take 1 mg by mouth 3 (three) times daily as needed for anxiety.     DULoxetine 60 MG capsule  Commonly known as:  CYMBALTA  Take 1 capsule (60 mg total) by mouth daily.     levothyroxine 200 MCG tablet  Commonly known as:  SYNTHROID, LEVOTHROID  Take 200 mcg by mouth daily before breakfast. Take along with 50 mcg to equal 250 mcg total dose.     levothyroxine 50 MCG tablet  Commonly known as:  SYNTHROID, LEVOTHROID  Take 50 mcg by mouth daily before breakfast. Take along with 200 mcg tablet to equal 250 mcg total dose.           Total Time in preparing paper work, data evaluation and todays exam - 35 minutes  Dustin Flock M.D on 03/21/2015 at 8:29 AM  [place]  [phone]

## 2015-03-21 NOTE — H&P (Addendum)
[facility] [place] NAME: [name]    MR#:  [id number]  DATE OF BIRTH:  1965/11/15  DATE OF ADMISSION:  03/21/2015  PRIMARY CARE PHYSICIAN: No primary care provider on file.   REQUESTING/REFERRING PHYSICIAN: Dr Posey Pronto   CHIEF COMPLAINT:  No chief complaint on file.   HISTORY OF PRESENT ILLNESS:  [name]  is a 49 y.o. female initially admitted on July 3 for acute on chronic pancreatitis. She was found to have a new pancreatic pseudocyst and as she had been seen and treated at [facility] for the same in the past she was transferred to [facility] today for endoscopy and return to [facility] this evening. Today she had EGD by Dr. Okey Dupre at [facility]. The prior gastric stent was removed. 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 Recommendations are for upper GI series to evaluate for possible surgical gastrojejunostomy. On readmission she is frustrated at being continued on a clear liquid diet. She is anxious and continues to show signs of alcohol withdrawal. She reports that her abdominal pain has resolved.  PAST MEDICAL HISTORY:   Past Medical History  Diagnosis Date  . Asthma   . Chronic pancreatitis   . History of drug use   . Alcohol abuse   . Hypothyroidism   . Depression   . Hepatitis C   . COPD (chronic obstructive pulmonary disease)     PAST SURGICAL HISTORY:   Past Surgical History  Procedure Laterality Date  . Total thyroidectomy    . Cesarean section      SOCIAL HISTORY:   History  Substance Use Topics  . Smoking status: Current Every Day Smoker -- 1.00 packs/day    Types: Cigarettes  . Smokeless tobacco: Not on file  . Alcohol Use: 0.0 oz/week    0 Standard drinks or equivalent per week    FAMILY HISTORY:   Family  History  Problem Relation Age of Onset  . Diabetes Mellitus II Maternal Aunt   . Pancreatic cancer Paternal Aunt   . Diabetes Mellitus II Maternal Grandfather   . CAD Maternal Grandfather   . Diabetes Mellitus II Mother   . Liver cancer Mother   . Arthritis Mother     DRUG ALLERGIES:   Allergies  Allergen Reactions  . Penicillins Rash    REVIEW OF SYSTEMS:   Review of Systems  Constitutional: Negative for fever.  Respiratory: Negative for shortness of breath.   Cardiovascular: Negative for chest pain and palpitations.  Gastrointestinal: Negative for nausea, vomiting and abdominal pain.  Genitourinary: Negative for dysuria.    MEDICATIONS AT HOME:   Prior to Admission medications   Medication Sig Start Date End Date Taking? Authorizing Provider  albuterol (PROVENTIL HFA;VENTOLIN HFA) 108 (90 BASE) MCG/ACT inhaler Inhale 2 puffs into the lungs every 6 (six) hours as needed for wheezing or shortness of breath.    Historical Provider, MD  clonazePAM (KLONOPIN) 1 MG tablet Take 1 mg by mouth 3 (three) times daily as needed for anxiety.     Historical Provider, MD  DULoxetine (CYMBALTA) 60 MG capsule Take 1 capsule (60 mg total) by mouth daily. 03/05/15   Kathrine Haddock, NP  levothyroxine (SYNTHROID, LEVOTHROID) 200 MCG tablet Take 200 mcg by mouth daily before breakfast. Take along  with 50 mcg to equal 250 mcg total dose.    Historical Provider, MD  levothyroxine (SYNTHROID, LEVOTHROID) 50 MCG tablet Take 50 mcg by mouth daily before breakfast. Take along with 200 mcg tablet to equal 250 mcg total dose.    Historical Provider, MD      VITAL SIGNS:  Blood pressure 139/74, pulse 87, temperature 97.7 F (36.5 C), temperature source Oral, resp. rate 20, SpO2 100 %.  PHYSICAL EXAMINATION:  GENERAL:  49 y.o.-year-old patient lying in the bed with no acute distress.  EYES: Pupils equal, round, reactive to light and accommodation. No scleral icterus. Extraocular muscles intact.   HEENT: Head atraumatic, normocephalic. Oropharynx and nasopharynx clear. His membranes are dry  NECK:  Supple, no jugular venous distention. No thyroid enlargement, no tenderness.  LUNGS: Normal breath sounds bilaterally, no wheezing, rales,rhonchi or crepitation. No use of accessory muscles of respiration.  CARDIOVASCULAR: S1, S2 normal. No murmurs, rubs, or gallops.  ABDOMEN: Soft, nontender, nondistended. Bowel sounds present. No organomegaly or mass. Guarding no rebound  EXTREMITIES: No pedal edema, cyanosis, or clubbing.  NEUROLOGIC: Cranial nerves II through XII are mostly intact, she moves all 4 extremities upon tenuously but does not participate in the neuro examination Psychiatric  The patient is alertoriented to self, vaguely oriented to place not to year. She is anxious  SKIN: No obvious rash, lesion, or ulcer.   LABORATORY PANEL:   CBC  Recent Labs Lab 03/21/15 0613  WBC 3.8  HGB 11.3*  HCT 33.8*  PLT 141*   ------------------------------------------------------------------------------------------------------------------  Chemistries   Recent Labs Lab 03/20/15 0502  03/21/15 0613  NA 140  < > 141  K 3.0*  < > 3.1*  CL 113*  < > 111  CO2 22  < > 26  GLUCOSE 81  < > 96  BUN 9  < > <5*  CREATININE 0.55  < > 0.48  CALCIUM 7.9*  < > 8.2*  MG 1.7  --   --   AST 141*  --  59*  ALT 48  --  39  ALKPHOS 167*  --  154*  BILITOT 1.4*  --  0.8  < > = values in this interval not displayed. ------------------------------------------------------------------------------------------------------------------  Cardiac Enzymes No results for input(s): TROPONINI in the last 168 hours. ------------------------------------------------------------------------------------------------------------------  RADIOLOGY:  Dg Chest Port 1 View  03/20/2015   CLINICAL DATA:  PICC line placement  EXAM: PORTABLE CHEST - 1 VIEW  COMPARISON:  Chest x-ray of 03/15/2012  FINDINGS: Unfortunately  the tip of the PICC line is overlain by an electrode lead. However the tip of the right PICC line appears to extend to the low SVC, just above the expected RA junction. No pneumothorax is seen. The lungs are clear. The heart is borderline enlarged and stable.  IMPRESSION: Tip of right PICC line is difficult to visualize as noted above, projecting over the lower SVC.   Electronically Signed   By: Ivar Drape M.D.   On: 03/20/2015 16:59    EKG:   Orders placed or performed during the hospital encounter of 03/18/15  . ED EKG  . ED EKG  . EKG 12-Lead  . EKG 12-Lead  . EKG 12-Lead  . EKG 12-Lead    IMPRESSION AND PLAN:   #1 acute on chronic pancreatitis with pancreatic pseudocyst:  #2 gastric outlet stenosis  Status post endoscopy today at [facility] above for the report. We will reconsult gastroenterology here at [facility]. She will need an upper gi  series tomorrow to evaluate for possible surgery. He will remain on a clear liquid diet this evening.  #3 alcohol abuse with history of seizures and DTs: Continues to CIWA coverage. Reconsult social work and psychiatry for further assistance. She is accompanied today by her partner who is very supportive.  #4 hypokalemia: Replace potassium and recheck in the morning. Check magnesium   All the records are reviewed and case discussed with ED provider. Management plans discussed with the patient, family and they are in agreement.  CODE STATUS: Full   TOTAL TIME TAKING CARE OF THIS PATIENT: 40 minutes.    Myrtis Ser M.D on 03/21/2015 at 7:55 PM  Between 7am to 6pm - Pager - [phone]  After 6pm go to [email or web address] - password [email or web address] [facility]  [place] [facility]  Office  [phone]  CC: Primary care physician; No primary care provider on file.

## 2015-03-21 NOTE — Progress Notes (Signed)
Psychiatry consult received on July 5 at 4:30 PM. I went to see the patient today that I was informed that the patient was transferred to [facility] for a procedure. Nursing did not know where the patient or when the patient was going to be readmitted.  We will attempt to follow up in the morning.

## 2015-03-22 ENCOUNTER — Encounter: Payer: Self-pay | Admitting: Psychiatry

## 2015-03-22 DIAGNOSIS — F1024 Alcohol dependence with alcohol-induced mood disorder: Secondary | ICD-10-CM | POA: Diagnosis present

## 2015-03-22 DIAGNOSIS — F3289 Other specified depressive episodes: Secondary | ICD-10-CM

## 2015-03-22 DIAGNOSIS — F1020 Alcohol dependence, uncomplicated: Secondary | ICD-10-CM

## 2015-03-22 LAB — CBC
HEMATOCRIT: 32.1 % — AB (ref 35.0–47.0)
Hemoglobin: 11 g/dL — ABNORMAL LOW (ref 12.0–16.0)
MCH: 36.9 pg — AB (ref 26.0–34.0)
MCHC: 34.4 g/dL (ref 32.0–36.0)
MCV: 107.3 fL — AB (ref 80.0–100.0)
PLATELETS: 126 10*3/uL — AB (ref 150–440)
RBC: 2.99 MIL/uL — ABNORMAL LOW (ref 3.80–5.20)
RDW: 13.3 % (ref 11.5–14.5)
WBC: 4.2 10*3/uL (ref 3.6–11.0)

## 2015-03-22 LAB — COMPREHENSIVE METABOLIC PANEL
ALBUMIN: 2.8 g/dL — AB (ref 3.5–5.0)
ALT: 32 U/L (ref 14–54)
ANION GAP: 5 (ref 5–15)
AST: 47 U/L — ABNORMAL HIGH (ref 15–41)
Alkaline Phosphatase: 141 U/L — ABNORMAL HIGH (ref 38–126)
BILIRUBIN TOTAL: 0.9 mg/dL (ref 0.3–1.2)
BUN: 5 mg/dL — ABNORMAL LOW (ref 6–20)
CALCIUM: 8.3 mg/dL — AB (ref 8.9–10.3)
CHLORIDE: 109 mmol/L (ref 101–111)
CO2: 28 mmol/L (ref 22–32)
CREATININE: 0.59 mg/dL (ref 0.44–1.00)
GFR calc Af Amer: >60 mL/min (ref >60)
Glucose, Bld: 87 mg/dL (ref 65–99)
Potassium: 3 mmol/L — ABNORMAL LOW (ref 3.5–5.1)
SODIUM: 142 mmol/L (ref 135–145)
Total Protein: 6.2 g/dL — ABNORMAL LOW (ref 6.5–8.1)

## 2015-03-22 LAB — MAGNESIUM: Magnesium: 1.8 mg/dL (ref 1.7–2.4)

## 2015-03-22 MED ORDER — SODIUM CHLORIDE 0.9 % IV SOLN
INTRAVENOUS | Status: AC
  Administered 2015-03-22 – 2015-03-23 (×2): via INTRAVENOUS

## 2015-03-22 NOTE — Plan of Care (Signed)
Problem: Discharge Progression Outcomes Goal: Discharge plan in place and appropriate Outcome: Progressing Patient was discharged from 2A, transferred to [facility] for endoscopy and EGD with gastric stent removal, then transferred to this floor. High Fall Risk, CIWA scale. Patient from home, lives with partner.  Hx of Hep C, astham, chornic pancreatitis, hypothyroidism, COPD, continue home medications. Last drink was on July 3rd per patient's partner.    Goal: Other Discharge Outcomes/Goals Outcome: Progressing Patient is alert and oriented, anxious. Constantly complains about diet order. C/o pain on left abdomen, morphine given with some relief. Ativan given for CIWA score. Sitter at bedside for beginning of shift, patient has done well and thus sitter removed around 0030.

## 2015-03-22 NOTE — Clinical Social Work Note (Signed)
Clinical Social Work Assessment  Patient Details  Name: [name] MRN: [id number] Date of Birth: 08-Jul-1966  Date of referral:  03/22/15               Reason for consult:  Substance Use/ETOH Abuse                Permission sought to share information with:    Permission granted to share information::  No  Name::        Agency::     Relationship::     Contact Information:     Housing/Transportation Living arrangements for the past 2 months:  Single Family Home Source of Information:  Patient Patient Interpreter Needed:  None Criminal Activity/Legal Involvement Pertinent to Current Situation/Hospitalization:  No - Comment as needed Significant Relationships:  Significant Other Lives with:  Significant Other Do you feel safe going back to the place where you live?  Yes Need for family participation in patient care:  No (Coment)  Care giving concerns:  Patient lives with her boyfriend in [place].    Social Worker assessment / plan:  Holiday representative (CSW) received substance abuse consult. CSW met with patient alone at bedside. Patient was sitting up in the bed watching TV. Patient reported that she lives with her boyfriend in [place]. 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 Patient's boyfriend has a full time job and is retired from [employer]. Patient reported that she does have children and her "first grandchild is on the way." Patient reported that she did not want to talk about her alcohol use. Patient reported that she drinks a lot every day and reported that it is too many to count. Patient reported that she has tried going to [employer] and [place]. Patient reported that nothing has worked to help her with recovery. Patient was avoiding questions and reported that she did not want to discuss her drinking. CSW gave patient outpatient substance abuse resources. Please reconsult if future social work needs arise. CSW signing off.    Employment  status:  Education officer, museum information:  Self Pay (Medicaid Pending) PT Recommendations:  Not assessed at this time Information / Referral to community resources:  Outpatient Substance Abuse Treatment Options  Patient/Family's Response to care:  Patient has resistant to discussing her alcohol use.   Patient/Family's Understanding of and Emotional Response to Diagnosis, Current Treatment, and Prognosis: Patient was pleasant and reported that she did not want to discuss her alcohol use.   Emotional Assessment Appearance:  Appears older than stated age Attitude/Demeanor/Rapport:  Avoidant Affect (typically observed):  Blunt Orientation:  Oriented to Self, Oriented to Place, Oriented to  Time, Oriented to Situation Alcohol / Substance use:  Alcohol Use Psych involvement (Current and /or in the community):  Yes (Comment) (Substance Abuse )  Discharge Needs  Concerns to be addressed:  Denies Needs/Concerns at this time Readmission within the last 30 days:  No Current discharge risk:  Substance Abuse Barriers to Discharge:  Continued Medical Work up   Loralyn Freshwater, LCSW 03/22/2015, 3:28 PM

## 2015-03-22 NOTE — Consult Note (Addendum)
Subjective: Patient seen for abdominal pain. Had egd and remocal of cystgastrostomy tube removed at [facility] yesterday.  Pyloric/proximal stenosis noted at EGD, recs noted.  Denies nausea, NGT placed at egd not currently in place. Complains of upper abdominal pain.  Objective: Vital signs in last 24 hours: Temp:  [97.7 F (36.5 C)-98.4 F (36.9 C)] 97.8 F (36.6 C) (07/07 1319) Pulse Rate:  [75-101] 75 (07/07 1319) Resp:  [18-20] 18 (07/07 1319) BP: (139-157)/(74-108) 149/93 mmHg (07/07 1319) SpO2:  [95 %-100 %] 97 % (07/07 1319) Weight:  [54.885 kg (121 lb)] 54.885 kg (121 lb) (07/06 2300) Blood pressure 149/93, pulse 75, temperature 97.8 F (36.6 C), temperature source Oral, resp. rate 18, height 5\' 1"  (1.549 m), weight 54.885 kg (121 lb), SpO2 97 %.   Intake/Output from previous day:    Intake/Output this shift:     General appearance:  49 yo female, nad. Resp:  bcta Cardio:  rrr GI:  Soft, mild discomfort in the epigastric area. No M/R/O. bs positive.  Extremities:     Lab Results: Results for orders placed or performed during the hospital encounter of 03/21/15 (from the past 24 hour(s))  Comprehensive metabolic panel     Status: Abnormal   Collection Time: 03/22/15  5:37 AM  Result Value Ref Range   Sodium 142 135 - 145 mmol/L   Potassium 3.0 (L) 3.5 - 5.1 mmol/L   Chloride 109 101 - 111 mmol/L   CO2 28 22 - 32 mmol/L   Glucose, Bld 87 65 - 99 mg/dL   BUN 5 (L) 6 - 20 mg/dL   Creatinine, Ser 0.59 0.44 - 1.00 mg/dL   Calcium 8.3 (L) 8.9 - 10.3 mg/dL   Total Protein 6.2 (L) 6.5 - 8.1 g/dL   Albumin 2.8 (L) 3.5 - 5.0 g/dL   AST 47 (H) 15 - 41 U/L   ALT 32 14 - 54 U/L   Alkaline Phosphatase 141 (H) 38 - 126 U/L   Total Bilirubin 0.9 0.3 - 1.2 mg/dL   GFR calc non Af Amer >60 >60 mL/min   GFR calc Af Amer >60 >60 mL/min   Anion gap 5 5 - 15  CBC     Status: Abnormal   Collection Time: 03/22/15  5:37 AM  Result Value Ref Range   WBC 4.2 3.6 - 11.0 K/uL   RBC 2.99  (L) 3.80 - 5.20 MIL/uL   Hemoglobin 11.0 (L) 12.0 - 16.0 g/dL   HCT 32.1 (L) 35.0 - 47.0 %   MCV 107.3 (H) 80.0 - 100.0 fL   MCH 36.9 (H) 26.0 - 34.0 pg   MCHC 34.4 32.0 - 36.0 g/dL   RDW 13.3 11.5 - 14.5 %   Platelets 126 (L) 150 - 440 K/uL  Magnesium     Status: None   Collection Time: 03/22/15  5:37 AM  Result Value Ref Range   Magnesium 1.8 1.7 - 2.4 mg/dL      Recent Labs  03/20/15 0502 03/21/15 0613 03/22/15 0537  WBC 3.7 3.8 4.2  HGB 12.0 11.3* 11.0*  HCT 35.9 33.8* 32.1*  PLT 127* 141* 126*   BMET  Recent Labs  03/20/15 1636 03/21/15 0613 03/22/15 0537  NA 138 141 142  K 3.4* 3.1* 3.0*  CL 110 111 109  CO2 24 26 28   GLUCOSE 127* 96 87  BUN 5* <5* 5*  CREATININE 0.58 0.48 0.59  CALCIUM 7.9* 8.2* 8.3*   LFT  Recent Labs  03/22/15 0537  PROT 6.2*  ALBUMIN 2.8*  AST 47*  ALT 32  ALKPHOS 141*  BILITOT 0.9   PT/INR No results for input(s): LABPROT, INR in the last 72 hours. Hepatitis Panel No results for input(s): HEPBSAG, HCVAB, HEPAIGM, HEPBIGM in the last 72 hours. C-Diff No results for input(s): CDIFFTOX in the last 72 hours. No results for input(s): CDIFFPCR in the last 72 hours.   Studies/Results: Dg Chest Port 1 View  03/20/2015   CLINICAL DATA:  PICC line placement  EXAM: PORTABLE CHEST - 1 VIEW  COMPARISON:  Chest x-ray of 03/15/2012  FINDINGS: Unfortunately the tip of the PICC line is overlain by an electrode lead. However the tip of the right PICC line appears to extend to the low SVC, just above the expected RA junction. No pneumothorax is seen. The lungs are clear. The heart is borderline enlarged and stable.  IMPRESSION: Tip of right PICC line is difficult to visualize as noted above, projecting over the lower SVC.   Electronically Signed   By: Ivar Drape M.D.   On: 03/20/2015 16:59    Scheduled Inpatient Medications:   . DULoxetine  60 mg Oral Daily  . folic acid  1 mg Oral Daily  . heparin  5,000 Units Subcutaneous 3 times per  day  . levothyroxine  200 mcg Oral QAC breakfast  . levothyroxine  50 mcg Oral QAC breakfast  . multivitamin with minerals  1 tablet Oral Daily  . thiamine  100 mg Oral Daily   Or  . thiamine  100 mg Intravenous Daily    Continuous Inpatient Infusions:   . sodium chloride 75 mL/hr at 03/22/15 1227    PRN Inpatient Medications:  acetaminophen **OR** acetaminophen, albuterol, LORazepam **OR** LORazepam, morphine injection, ondansetron **OR** ondansetron (ZOFRAN) IV  Miscellaneous:   Assessment:  1) h/o chronic pancreatitis with pancreatic pseudocyst, s/p cystgastrostomy tube and removal 2) pyloric stricture. Awaiting UGIS and surgical consult.  3) h/o ongoing etoh/substance abuse.   Plan:  As noted.   Lollie Sails MD 03/22/2015, 3:54 PM

## 2015-03-22 NOTE — Plan of Care (Signed)
Problem: Discharge Progression Outcomes Goal: Discharge plan in place and appropriate Individualization  Outcome: Progressing Patient was discharged from 2A, transferred to [facility] for endoscopy and EGD with gastric stent removal, then transferred to this floor. High Fall Risk, CIWA scale. Patient from home, lives with partner.   Hx of Hep C, astham, chornic pancreatitis, hypothyroidism, COPD, continue home medications. Last drink was on July 3rd per patient's partner. Goal: Other Discharge Outcomes/Goals Outcome: Progressing Plan of Care Progressing to Goal: Patient given pain meds with noted relief. CIWAA performed. Tolerating clear liquid diet. VSS.

## 2015-03-22 NOTE — Consult Note (Signed)
[place] [place] [facility] Consult   Reason for Consult:  To evaluate the patient with alcoholism and depression. Referring Physician:  Dr. Fritzi Mandes Patient Identification: [name] MRN:  [id number] Principal Diagnosis: Alcohol use disorder, severe, dependence Diagnosis:   Patient Active Problem List   Diagnosis Date Noted  . Alcohol use disorder, severe, dependence [F10.20] 03/22/2015  . 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 Pancreatitis [K85.9] 03/21/2015  . Chronic pancreatitis [K86.1] 03/18/2015  . Pancreatic pseudocyst [K86.3] 03/18/2015  . Alcohol abuse [F10.10] 03/18/2015  . Hypothyroidism [E03.9] 03/18/2015  . Depression [F32.9] 03/18/2015  . Abdominal pain, epigastric [R10.13] 03/18/2015    Total Time spent with patient: 45 minutes  Subjective:   [name] is a 49 y.o. female patient admitted with alcohol-induced pancreatitis.  HPI:   Identifying data. [name] is a 49 year old female with a history of severe alcoholism, depression, and mood instability.  Chief complaint. "I relapsed 3 weeks ago."  History of present illness. [name] has a long history of alcoholism with multiple hospitalizations for detox as well as substance abuse treatment. She was last admitted to [facility] in 2013 for worsening of depression and anxiety and suicidal ideation. The patient reported that since discharge she has been maintained on Cymbalta that is prescribed by her primary provider. She has not been seeing a psychiatrist or a therapist. She reports that she had been able to maintain sobriety until 3 weeks ago when she relapsed on beers. She is unable to tell me how much she is been drinking. This precipitated pancreatitis again and the patient was admitted to medical floor. I spoke extensively with her and her boyfriend who both confirmed that the patient did not have any sobriety to speak of. In  addition to alcohol she's been abusing description pills. The patient reports mild symptoms of depression prior to admission with poor energy and concentration. Since admission to the hospital she feels depressed, quite hopeless, guilty, and worthless. She denies thoughts of hurting herself or others. She denies psychotic symptoms or symptoms suggestive of bipolar mania. On direct questioning she denies other than alcohol substance use. She reports heightened anxiety while in the hospital related to her physical problems. Pancreatic cyst was drinking yesterday but she was informed that her stent will have to be removed. She does not know what the treatment and is going to be in the future.  Past psychiatric history multiple psychiatric hospitalization for depression anxiety and substance use. She was admitted probably 15 times or more for substance abuse related problems she was treated [facility] in [place], [place], and [place]. She has been tried on numerous psychotherapy medications including Lexapro and Remeron and Prozac but feels that Cymbalta works best for her. She reports good treatment compliance. She denies ever attempting suicide.  Family psychiatric history. Grandmother received ECT treatment at [facility]. Mother with the mental illness as well.   Social history. She is originally from [place]. She graduated from high school. According to her and she is been in foster care as her mother was unwell. 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 She is divorced and lives with a boyfriend who is very supportive and not a drinker himself. At the patient reports stress from her favorite aunt dying of dementia. Apparently at the patient is not directly involved in caring for her aunt. Moreover she is not allowed to be around unless clean.  Past Medical History:  Past Medical History  Diagnosis Date  . Asthma   . Chronic pancreatitis   . History of drug use   . Alcohol  abuse   . Hypothyroidism   . Depression   . Hepatitis C   . COPD (chronic obstructive pulmonary disease)     Past Surgical History  Procedure Laterality Date  . Total thyroidectomy    . Cesarean section     Family History:  Family History  Problem Relation Age of Onset  . Diabetes Mellitus II Maternal Aunt   . Pancreatic cancer Paternal Aunt   . Diabetes Mellitus II Maternal Grandfather   . CAD Maternal Grandfather   . Diabetes Mellitus II Mother   . Liver cancer Mother   . Arthritis Mother    Social History:  History  Alcohol Use  . 0.0 oz/week  . 0 Standard drinks or equivalent per week     History  Drug Use No    History   Social History  . Marital Status: Single    Spouse Name: N/A  . Number of Children: N/A  . Years of Education: N/A   Social History Main Topics  . Smoking status: Current Every Day Smoker -- 1.00 packs/day    Types: Cigarettes  . Smokeless tobacco: Not on file  . Alcohol Use: 0.0 oz/week    0 Standard drinks or equivalent per week  . Drug Use: No  . Sexual Activity: Not on file   Other Topics Concern  . None   Social History Narrative   Additional Social History:                          Allergies:   Allergies  Allergen Reactions  . Penicillins Rash    Labs:  Results for orders placed or performed during the hospital encounter of 03/21/15 (from the past 48 hour(s))  Comprehensive metabolic panel     Status: Abnormal   Collection Time: 03/22/15  5:37 AM  Result Value Ref Range   Sodium 142 135 - 145 mmol/L   Potassium 3.0 (L) 3.5 - 5.1 mmol/L   Chloride 109 101 - 111 mmol/L   CO2 28 22 - 32 mmol/L   Glucose, Bld 87 65 - 99 mg/dL   BUN 5 (L) 6 - 20 mg/dL   Creatinine, Ser 0.59 0.44 - 1.00 mg/dL   Calcium 8.3 (L) 8.9 - 10.3 mg/dL   Total Protein 6.2 (L) 6.5 - 8.1 g/dL   Albumin 2.8 (L) 3.5 - 5.0 g/dL   AST 47 (H) 15 - 41 U/L   ALT 32 14 - 54 U/L   Alkaline Phosphatase 141 (H) 38 - 126 U/L   Total Bilirubin  0.9 0.3 - 1.2 mg/dL   GFR calc non Af Amer >60 >60 mL/min   GFR calc Af Amer >60 >60 mL/min    Comment: (NOTE) The eGFR has been calculated using the CKD EPI equation. This calculation has not been validated in all clinical situations. eGFR's persistently <60 mL/min signify possible Chronic Kidney Disease.    Anion gap 5 5 - 15  CBC     Status: Abnormal   Collection Time: 03/22/15  5:37 AM  Result Value Ref Range   WBC 4.2 3.6 - 11.0 K/uL   RBC 2.99 (L) 3.80 - 5.20 MIL/uL   Hemoglobin 11.0 (L) 12.0 - 16.0 g/dL   HCT 32.1 (L) 35.0 - 47.0 %   MCV 107.3 (H) 80.0 - 100.0  fL   MCH 36.9 (H) 26.0 - 34.0 pg   MCHC 34.4 32.0 - 36.0 g/dL   RDW 13.3 11.5 - 14.5 %   Platelets 126 (L) 150 - 440 K/uL  Magnesium     Status: None   Collection Time: 03/22/15  5:37 AM  Result Value Ref Range   Magnesium 1.8 1.7 - 2.4 mg/dL    Vitals: Blood pressure 157/85, pulse 79, temperature 98.3 F (36.8 C), temperature source Oral, resp. rate 18, height 5' 1"  (1.549 m), weight 54.885 kg (121 lb), SpO2 97 %.  Risk to Self: Is patient at risk for suicide?: No Risk to Others:   Prior Inpatient Therapy:   Prior Outpatient Therapy:    Current Facility-Administered Medications  Medication Dose Route Frequency Provider Last Rate Last Dose  . acetaminophen (TYLENOL) tablet 650 mg  650 mg Oral Q6H PRN Aldean Jewett, MD       Or  . acetaminophen (TYLENOL) suppository 650 mg  650 mg Rectal Q6H PRN Aldean Jewett, MD      . albuterol (PROVENTIL) (2.5 MG/3ML) 0.083% nebulizer solution 2.5 mg  2.5 mg Inhalation Q6H PRN Aldean Jewett, MD      . DULoxetine (CYMBALTA) DR capsule 60 mg  60 mg Oral Daily Aldean Jewett, MD   60 mg at 03/22/15 4401  . folic acid (FOLVITE) tablet 1 mg  1 mg Oral Daily Aldean Jewett, MD   1 mg at 03/22/15 0272  . heparin injection 5,000 Units  5,000 Units Subcutaneous 3 times per day Aldean Jewett, MD   5,000 Units at 03/22/15 0542  . levothyroxine (SYNTHROID,  LEVOTHROID) tablet 200 mcg  200 mcg Oral QAC breakfast Aldean Jewett, MD   200 mcg at 03/22/15 0758  . levothyroxine (SYNTHROID, LEVOTHROID) tablet 50 mcg  50 mcg Oral QAC breakfast Aldean Jewett, MD   50 mcg at 03/22/15 0758  . LORazepam (ATIVAN) tablet 1 mg  1 mg Oral Q6H PRN Aldean Jewett, MD       Or  . LORazepam (ATIVAN) injection 1 mg  1 mg Intravenous Q6H PRN Aldean Jewett, MD   1 mg at 03/21/15 2313  . morphine 2 MG/ML injection 2 mg  2 mg Intravenous Q4H PRN Aldean Jewett, MD   2 mg at 03/22/15 0759  . multivitamin with minerals tablet 1 tablet  1 tablet Oral Daily Aldean Jewett, MD   1 tablet at 03/22/15 [phone]  . ondansetron (ZOFRAN) tablet 4 mg  4 mg Oral Q6H PRN Aldean Jewett, MD       Or  . ondansetron [facility]) injection 4 mg  4 mg Intravenous Q6H PRN Aldean Jewett, MD      . thiamine (VITAMIN B-1) tablet 100 mg  100 mg Oral Daily Aldean Jewett, MD   100 mg at 03/22/15 4403   Or  . thiamine (B-1) injection 100 mg  100 mg Intravenous Daily Aldean Jewett, MD        Musculoskeletal: Strength & Muscle Tone: within normal limits Gait & Station: Not tested Patient leans: N/A  Psychiatric Specialty Exam: Physical Exam  Nursing note and vitals reviewed.   Review of Systems  Gastrointestinal: Positive for abdominal pain.  All other systems reviewed and are negative.   Blood pressure 157/85, pulse 79, temperature 98.3 F (36.8 C), temperature source Oral, resp. rate 18, height 5' 1"  (1.549 m), weight 54.885 kg (121 lb), SpO2 97 %.  Body mass index is 22.87 kg/(m^2).  General Appearance: Dishaveled.  Eye Contact::  Fair  Speech:  Clear and Coherent  Volume:  Normal  Mood:  Anxious  Affect:  Appropriate  Thought Process:  Goal Directed  Orientation:  Full (Time, Place, and Person)  Thought Content:  NA  Suicidal Thoughts:  No  Homicidal Thoughts:  No  Memory:  Immediate;   Fair Recent;   Fair Remote;   Fair  Judgement:  Fair  Insight:   Fair  Psychomotor Activity:  Decreased  Concentration:  Fair  Recall:  [employer] [place]  Language: Fair  Akathisia:  No  Handed:  Right  AIMS (if indicated):     Assets:  Communication Skills Desire for Improvement Housing Intimacy Social Support  ADL's:  Intact  Cognition: WNL  Sleep:      Medical Decision Making: New problem, with additional work up planned, Review of Psycho-Social Stressors (1), Review or order clinical lab tests (1), Review of Medication Regimen & Side Effects (2) and Review of New Medication or Change in Dosage (2)  Treatment Plan Summary: Daily contact with patient to assess and evaluate symptoms and progress in treatment and Medication management   Miss Sockwell is a 48 year old female with history of alcoholism, depression and anxiety admitted for pancreatitis in the context of relapse on alcohol.  1. Alcohol dependence. Please continue alcohol detox.  2. Depression and anxiety. We will continue Cymbalta.   3. Psychiatry will follow along.     Plan: No evidence of imminent risk to self or others at present.   Patient does not meet criteria for psychiatric inpatient admission. Supportive therapy provided about ongoing stressors. Discussed crisis plan, support from social network, calling 911, coming to the Emergency Department, and calling Suicide Hotline. Disposition:    Azarias Chiou 03/22/2015 11:55 AM

## 2015-03-22 NOTE — Progress Notes (Signed)
[place] [facility] [place] NAME: [name]    MR#:  [id number]  DATE OF BIRTH:  [birth year]  SUBJECTIVE:  CHIEF COMPLAINT:   No chief complaint on file.  Patient went to [facility] yesterday and had a upper GI Endo patient had a stent exchange, pre-existing gastric stent was removed. She was noted to have gastric stenosis at the pylorus and first part of duodenum. She was recommended to have upper GI series and a possible surgical intervention  REVIEW OF SYSTEMS:   Review of Systems  Constitutional: Negative for fever.  Respiratory: Negative for shortness of breath.   Cardiovascular: Negative for chest pain and palpitations.  Gastrointestinal: Positive for nausea, vomiting and abdominal pain. Negative for diarrhea.  Genitourinary: Negative for dysuria.    DRUG ALLERGIES:   Allergies  Allergen Reactions  . Penicillins Rash    VITALS:  Blood pressure 157/85, pulse 79, temperature 98.3 F (36.8 C), temperature source Oral, resp. rate 18, height 5\' 1"  (1.549 m), weight 54.885 kg (121 lb), SpO2 97 %.  PHYSICAL EXAMINATION:  GENERAL:  49 y.o.-year-old patient lying in the bed, uncomfortable EYES: Pupils equal, round, reactive to light and accommodation. No scleral icterus. Extraocular muscles intact.  HEENT: Head atraumatic, normocephalic. Oropharynx and nasopharynx clear.  NECK:  Supple, no jugular venous distention. No thyroid enlargement, no tenderness.  LUNGS: Normal breath sounds bilaterally, no wheezing, rales,rhonchi or crepitation. No use of accessory muscles of respiration.  CARDIOVASCULAR: S1, S2 normal. No murmurs, rubs, or gallops.  ABDOMEN: Soft, diffusely tender, most in the upper quadrants and epigastric area, nondistended. Bowel sounds present. No organomegaly or mass.  EXTREMITIES: No pedal edema, cyanosis, or clubbing.  NEUROLOGIC: Cranial nerves II through XII are intact. Muscle strength 5/5 in all extremities. Sensation  intact. Gait not checked.  PSYCHIATRIC: The patient is alert and oriented x 3.  SKIN: No obvious rash, lesion, or ulcer.    LABORATORY PANEL:   CBC  Recent Labs Lab 03/22/15 0537  WBC 4.2  HGB 11.0*  HCT 32.1*  PLT 126*   ------------------------------------------------------------------------------------------------------------------  Chemistries   Recent Labs Lab 03/22/15 0537  NA 142  K 3.0*  CL 109  CO2 28  GLUCOSE 87  BUN 5*  CREATININE 0.59  CALCIUM 8.3*  MG 1.8  AST 47*  ALT 32  ALKPHOS 141*  BILITOT 0.9   ------------------------------------------------------------------------------------------------------------------  Cardiac Enzymes No results for input(s): TROPONINI in the last 168 hours. ------------------------------------------------------------------------------------------------------------------  RADIOLOGY:  Dg Chest Port 1 View  03/20/2015   CLINICAL DATA:  PICC line placement  EXAM: PORTABLE CHEST - 1 VIEW  COMPARISON:  Chest x-ray of 03/15/2012  FINDINGS: Unfortunately the tip of the PICC line is overlain by an electrode lead. However the tip of the right PICC line appears to extend to the low SVC, just above the expected RA junction. No pneumothorax is seen. The lungs are clear. The heart is borderline enlarged and stable.  IMPRESSION: Tip of right PICC line is difficult to visualize as noted above, projecting over the lower SVC.   Electronically Signed   By: Ivar Drape M.D.   On: 03/20/2015 16:59    EKG:   Orders placed or performed during the hospital encounter of 03/18/15  . ED EKG  . ED EKG  . EKG 12-Lead  . EKG 12-Lead  . EKG 12-Lead  . EKG 12-Lead  . EKG    ASSESSMENT AND PLAN:   Active Problems:   Pancreatitis  #  1 acute on chronic pancreatitis with pancreatic pseudocyst: S/p EUS which showed gastric stenosis, and dudonal stenosis, UGI series will be ordered for tomm I spoke to Dr. Lysle Rubens he states he placed NGT placed ,  due to patient being agiated they removed it. They also saw the gastric obstuction and duodenal  Obstruction. If UGI showes obstruction then she will need surgrical eval at some point.   #2 alcohol abuse with high risk for seizure versus DTs: - ciwa  #3 hypokalemia -bmp f/u  #4 hypertension: Due to alcohol withdrawal. Continue with CIWA protocol  All the records are reviewed and case discussed with Care Management/Social Workerr. Management plans discussed with the patient, family and they are in agreement.  CODE STATUS: full  TOTAL TIME TAKING CARE OF THIS PATIENT: 75min  .  Dustin Flock M.D on 03/22/2015 at 10:53 AM  Between 7am to 6pm - Pager - [phone]  After 6pm go to [email or web address] - password [email or web address]  [facility]  Office  [phone]  CC: Primary care physician; No primary care provider on file.

## 2015-03-23 ENCOUNTER — Inpatient Hospital Stay: Payer: Self-pay

## 2015-03-23 LAB — BASIC METABOLIC PANEL
ANION GAP: 7 (ref 5–15)
CALCIUM: 7.8 mg/dL — AB (ref 8.9–10.3)
CHLORIDE: 106 mmol/L (ref 101–111)
CO2: 26 mmol/L (ref 22–32)
CREATININE: 0.59 mg/dL (ref 0.44–1.00)
GFR calc Af Amer: >60 mL/min (ref >60)
GFR calc non Af Amer: >60 mL/min (ref >60)
Glucose, Bld: 99 mg/dL (ref 65–99)
Potassium: 3 mmol/L — ABNORMAL LOW (ref 3.5–5.1)
Sodium: 139 mmol/L (ref 135–145)

## 2015-03-23 MED ORDER — POLYETHYLENE GLYCOL 3350 17 G PO PACK
17.0000 g | PACK | Freq: Every day | ORAL | Status: DC
  Administered 2015-03-23 – 2015-03-25 (×3): 17 g via ORAL
  Filled 2015-03-23 (×3): qty 1

## 2015-03-23 MED ORDER — DOCUSATE SODIUM 100 MG PO CAPS
100.0000 mg | ORAL_CAPSULE | Freq: Two times a day (BID) | ORAL | Status: DC
  Administered 2015-03-23 – 2015-03-26 (×7): 100 mg via ORAL
  Filled 2015-03-23 (×7): qty 1

## 2015-03-23 MED ORDER — LORAZEPAM 2 MG/ML IJ SOLN
1.0000 mg | Freq: Once | INTRAMUSCULAR | Status: AC
  Administered 2015-03-23: 1 mg via INTRAVENOUS
  Filled 2015-03-23: qty 1

## 2015-03-23 MED ORDER — HYDROMORPHONE HCL 1 MG/ML IJ SOLN
1.0000 mg | INTRAMUSCULAR | Status: DC | PRN
  Administered 2015-03-23 – 2015-03-25 (×17): 1 mg via INTRAVENOUS
  Filled 2015-03-23 (×17): qty 1

## 2015-03-23 MED ORDER — OXYCODONE-ACETAMINOPHEN 5-325 MG PO TABS
1.0000 | ORAL_TABLET | ORAL | Status: DC | PRN
  Administered 2015-03-23 – 2015-03-24 (×2): 1 via ORAL
  Filled 2015-03-23 (×2): qty 1

## 2015-03-23 MED ORDER — POTASSIUM CHLORIDE CRYS ER 20 MEQ PO TBCR
40.0000 meq | EXTENDED_RELEASE_TABLET | Freq: Two times a day (BID) | ORAL | Status: DC
  Administered 2015-03-23 – 2015-03-26 (×6): 40 meq via ORAL
  Filled 2015-03-23 (×7): qty 2

## 2015-03-23 MED ORDER — LABETALOL HCL 5 MG/ML IV SOLN
10.0000 mg | INTRAVENOUS | Status: DC | PRN
  Administered 2015-03-23 – 2015-03-24 (×2): 10 mg via INTRAVENOUS
  Filled 2015-03-23 (×4): qty 4

## 2015-03-23 NOTE — Progress Notes (Signed)
Notified Dr Vianne Bulls that pt scored 11 on CIWA and ativan is not due. MD verbalized to order Ativan 1mg  IV push once.

## 2015-03-23 NOTE — Consult Note (Signed)
Subjective: Patient seen for chronic pancreatitis, pyloric stenosis/duodenal stenosis. Currently tolerating full liquid diet. Denies with some nausea and abdominal pain at times requiring a medications. 5-9 out of 10 at times. No emesis reports a bowel movement 1 normal  Objective: Vital signs in last 24 hours: Temp:  [97.7 F (36.5 C)-98.3 F (36.8 C)] 97.9 F (36.6 C) (07/08 1257) Pulse Rate:  [68-88] 86 (07/08 1748) Resp:  [18-20] 18 (07/08 1257) BP: (156-178)/(83-121) 164/91 mmHg (07/08 1748) SpO2:  [96 %-98 %] 97 % (07/08 1257) Blood pressure 164/91, pulse 86, temperature 97.9 F (36.6 C), temperature source Oral, resp. rate 18, height 5\' 1"  (1.549 m), weight 54.885 kg (121 lb), SpO2 97 %.   Intake/Output from previous day: 07/07 0701 - 07/08 0700 In: 1302 [I.V.:1302] Out: 155 [Urine:155]  Intake/Output this shift: Total I/O In: 1166 [P.O.:240; I.V.:926] Out: 800 [Urine:800]   General appearance:  49 year old female no acute distress Resp:  Clear to auscultation Cardio:  Regular rate and rhythm GI:  Soft positive tender to palpation throughout the epigastric region extending toward the left upper quadrant. No masses rebound or organomegaly. Extremities:  No clubbing cyanosis or edema   Lab Results: Results for orders placed or performed during the hospital encounter of 03/21/15 (from the past 24 hour(s))  Basic metabolic panel     Status: Abnormal   Collection Time: 03/23/15 10:43 AM  Result Value Ref Range   Sodium 139 135 - 145 mmol/L   Potassium 3.0 (L) 3.5 - 5.1 mmol/L   Chloride 106 101 - 111 mmol/L   CO2 26 22 - 32 mmol/L   Glucose, Bld 99 65 - 99 mg/dL   BUN <5 (L) 6 - 20 mg/dL   Creatinine, Ser 0.59 0.44 - 1.00 mg/dL   Calcium 7.8 (L) 8.9 - 10.3 mg/dL   GFR calc non Af Amer >60 >60 mL/min   GFR calc Af Amer >60 >60 mL/min   Anion gap 7 5 - 15      Recent Labs  03/21/15 0613 03/22/15 0537  WBC 3.8 4.2  HGB 11.3* 11.0*  HCT 33.8* 32.1*  PLT 141*  126*   BMET  Recent Labs  03/21/15 0613 03/22/15 0537 03/23/15 1043  NA 141 142 139  K 3.1* 3.0* 3.0*  CL 111 109 106  CO2 26 28 26   GLUCOSE 96 87 99  BUN <5* 5* <5*  CREATININE 0.48 0.59 0.59  CALCIUM 8.2* 8.3* 7.8*   LFT  Recent Labs  03/22/15 0537  PROT 6.2*  ALBUMIN 2.8*  AST 47*  ALT 32  ALKPHOS 141*  BILITOT 0.9   PT/INR No results for input(s): LABPROT, INR in the last 72 hours. Hepatitis Panel No results for input(s): HEPBSAG, HCVAB, HEPAIGM, HEPBIGM in the last 72 hours. C-Diff No results for input(s): CDIFFTOX in the last 72 hours. No results for input(s): CDIFFPCR in the last 72 hours.   Studies/Results: Dg Abd 1 View  03/23/2015   CLINICAL DATA:  Abdominal pain.  EXAM: ABDOMEN - 1 VIEW  COMPARISON:  CT 03/18/2015.  FINDINGS: Large amount of oral contrast from prior CT noted throughout the colon. Upper GI and small bowel can be rescheduled following clearing of contrast. No bowel distention.  IMPRESSION: Large amount of oral contrast from prior CT noted throughout the colon. Upper GI and small bowel follow-through can be rescheduled following clearing of contrast . No bowel distention.   Electronically Signed   By: Marcello Moores  Register   On: 03/23/2015 08:39  Scheduled Inpatient Medications:   . docusate sodium  100 mg Oral BID  . DULoxetine  60 mg Oral Daily  . folic acid  1 mg Oral Daily  . heparin  5,000 Units Subcutaneous 3 times per day  . levothyroxine  200 mcg Oral QAC breakfast  . levothyroxine  50 mcg Oral QAC breakfast  . multivitamin with minerals  1 tablet Oral Daily  . polyethylene glycol  17 g Oral Daily  . potassium chloride  40 mEq Oral BID  . thiamine  100 mg Oral Daily   Or  . thiamine  100 mg Intravenous Daily    Continuous Inpatient Infusions:     PRN Inpatient Medications:  acetaminophen **OR** acetaminophen, albuterol, HYDROmorphone (DILAUDID) injection, LORazepam **OR** LORazepam, ondansetron **OR** ondansetron (ZOFRAN)  IV, oxyCODONE-acetaminophen  Miscellaneous:   Assessment:  1. History of alcohol/substance abuse ongoing 2. Right pancreatitis status post pancreatic cyst gastrostomy tube. 3. Pyloric stenosis/proximal duodenal stenosis. Upper GI series not done today due to retained contrast from previous testing.  Plan:  1. Awaiting upper GI series and then a surgical consult to follow. 2 Dr. Rayann Heman will be available this weekend if needed.  Lollie Sails MD 03/23/2015, 6:47 PM

## 2015-03-23 NOTE — Progress Notes (Signed)
   03/23/15 1050  Clinical Encounter Type  Visited With Patient and family together  Visit Type Initial  Attempted to visit with patient and family, but nurse explained that the patient was not feeling well today and needed to rest.  Michela Pitcher it was not a good time right now.  May attempt to visit again later today or this weekend if patient remains here.  [place] [phone]

## 2015-03-23 NOTE — Progress Notes (Signed)
Notified Dr Jannifer Franklin that BP 168/100, HR 96 apical. CIWA pt, received ativan at 2058 for alcohol withdrawn symptoms with some improvement.  Currently pt agitated, anxious, restless and crying. MD to place orders.

## 2015-03-23 NOTE — Progress Notes (Signed)
[place] at [place] NAME: [name]    MR#:  [id number]  DATE OF BIRTH:  [birth year]  SUBJECTIVE:  CHIEF COMPLAINT:   No chief complaint on file.  Still complains of pain once her diet restarted, was unable to do the upper GI due to the contrast material still noted in the small bowel according to radiology. REVIEW OF SYSTEMS:   Review of Systems  Constitutional: Negative for fever.  Respiratory: Negative for shortness of breath.   Cardiovascular: Negative for chest pain and palpitations.  Gastrointestinal: Positive for nausea, vomiting and abdominal pain. Negative for diarrhea.  Genitourinary: Negative for dysuria.    DRUG ALLERGIES:   Allergies  Allergen Reactions  . Penicillins Rash    VITALS:  Blood pressure 158/86, pulse 68, temperature 97.7 F (36.5 C), temperature source Oral, resp. rate 20, height 5\' 1"  (1.549 m), weight 54.885 kg (121 lb), SpO2 96 %.  PHYSICAL EXAMINATION:  GENERAL:  49 y.o.-year-old patient lying in the bed, uncomfortable EYES: Pupils equal, round, reactive to light and accommodation. No scleral icterus. Extraocular muscles intact.  HEENT: Head atraumatic, normocephalic. Oropharynx and nasopharynx clear.  NECK:  Supple, no jugular venous distention. No thyroid enlargement, no tenderness.  LUNGS: Normal breath sounds bilaterally, no wheezing, rales,rhonchi or crepitation. No use of accessory muscles of respiration.  CARDIOVASCULAR: S1, S2 normal. No murmurs, rubs, or gallops.  ABDOMEN: Soft, diffusely tender, most in the upper quadrants and epigastric area, nondistended. Bowel sounds present. No organomegaly or mass.  EXTREMITIES: No pedal edema, cyanosis, or clubbing.  NEUROLOGIC: Cranial nerves II through XII are intact. Muscle strength 5/5 in all extremities. Sensation intact. Gait not checked.  PSYCHIATRIC: The patient is alert and oriented x 3.  SKIN: No obvious rash, lesion, or ulcer.     LABORATORY PANEL:   CBC  Recent Labs Lab 03/22/15 0537  WBC 4.2  HGB 11.0*  HCT 32.1*  PLT 126*   ------------------------------------------------------------------------------------------------------------------  Chemistries   Recent Labs Lab 03/22/15 0537 03/23/15 1043  NA 142 139  K 3.0* 3.0*  CL 109 106  CO2 28 26  GLUCOSE 87 99  BUN 5* <5*  CREATININE 0.59 0.59  CALCIUM 8.3* 7.8*  MG 1.8  --   AST 47*  --   ALT 32  --   ALKPHOS 141*  --   BILITOT 0.9  --    ------------------------------------------------------------------------------------------------------------------  Cardiac Enzymes No results for input(s): TROPONINI in the last 168 hours. ------------------------------------------------------------------------------------------------------------------  RADIOLOGY:  Dg Abd 1 View  03/23/2015   CLINICAL DATA:  Abdominal pain.  EXAM: ABDOMEN - 1 VIEW  COMPARISON:  CT 03/18/2015.  FINDINGS: Large amount of oral contrast from prior CT noted throughout the colon. Upper GI and small bowel can be rescheduled following clearing of contrast. No bowel distention.  IMPRESSION: Large amount of oral contrast from prior CT noted throughout the colon. Upper GI and small bowel follow-through can be rescheduled following clearing of contrast . No bowel distention.   Electronically Signed   By: Marcello Moores  Register   On: 03/23/2015 08:39    EKG:   Orders placed or performed during the hospital encounter of 03/18/15  . ED EKG  . ED EKG  . EKG 12-Lead  . EKG 12-Lead  . EKG 12-Lead  . EKG 12-Lead  . 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 Lysle Rubens he states he placed NGT placed , due to patient being agiated they removed it. They also saw the gastric obstuction and duodenal Obstruction. Upper GI when tolerates, at this point we'll advance her diet if tolerates should be discharged home and needs to follow up with [facility] and surgery due to the fact the patient has a complicated gastric anatomy and the GI physicians here would not be able to intervene as well as surgery would be too complicated.  #2 alcohol abuse with high risk for seizure versus DTs: - ciwa  #3 hypokalemia -Replaced with oral treatment  #4 hypertension: Due to alcohol withdrawal. Continue with CIWA protocol  All the records are reviewed and case discussed with Care Management/Social Workerr. Management plans discussed with the patient, family and they are in agreement.  CODE STATUS: full  TOTAL TIME TAKING CARE OF THIS PATIENT: 38min  .  Dustin Flock M.D on 03/23/2015 at 11:24 AM  Between 7am to 6pm - Pager - [phone]  After 6pm go to [email or web address] - password [email or web address] [facility]  [facility]  Office  [phone]  CC: Primary care physician; No primary care provider on file.

## 2015-03-23 NOTE — Progress Notes (Signed)
Initial Nutrition Assessment  INTERVENTION:  Meals and Snacks: Cater to patient preferences; will send homemade milkshake on lunch tray Medical Food Supplement Therapy:  Will send [employer] Breakfast BID for added nutrition, each shake provides additional 250kcals and 13g protein.    NUTRITION DIAGNOSIS:  Inadequate oral intake related to altered GI function as evidenced by estimated needs (NPO/CL 5 days); however just advanced to [facility]   GOAL:  Patient will meet greater than or equal to 90% of their needs (Diet advancement as medically able)  MONITOR:   (Energy Intake, Electrolyte and renal Profile, Digestive system, Anthropometrics)  REASON FOR ASSESSMENT:   (Diagnosis)    ASSESSMENT:  Pt read[facility] from [facility] after endoscopy and cystagastromy tube removal; EUS showed gastric and duodenal stenosis. 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 Pt attempted UGI this am per Mack Guise, MD unable to complete as pt had contrast in colon from recent CT. Pt remains on CIWA.  PMHx:  Past Medical History  Diagnosis Date  . Asthma   . Chronic pancreatitis   . History of drug use   . Alcohol abuse   . Hypothyroidism   . Depression   . Hepatitis C   . COPD (chronic obstructive pulmonary disease)     Diet Order:  Diet full liquid Room service appropriate?: Yes; Fluid consistency:: Thin  Current Nutrition: Pt ate yogurt and juice this am; tolerated well.   Food/Nutrition-Related History: Pt reports she has been tolerating liquids for the past week. Pt has been on NPO/CL just advanced to [facility] today since admission on 7/3. Difficult to clarify intake prior to admission on 7/3. Pt reports usual intake of vegetables and meats.   Medications: folate, MVI, KCl, thiamine, NS at 75mL/hr  Electrolyte/Renal Profile and Glucose Profile:   Recent Labs Lab 03/20/15 0502  03/21/15 0613 03/22/15 0537 03/23/15 1043  NA 140  < > 141 142 139  K 3.0*  < > 3.1* 3.0* 3.0*  CL 113*  < >  111 109 106  CO2 22  < > 26 28 26   BUN 9  < > <5* 5* <5*  CREATININE 0.55  < > 0.48 0.59 0.59  CALCIUM 7.9*  < > 8.2* 8.3* 7.8*  MG 1.7  --   --  1.8  --   GLUCOSE 81  < > 96 87 99  < > = values in this interval not displayed. Protein Profile:  Recent Labs Lab 03/20/15 0502 03/21/15 0613 03/22/15 0537  ALBUMIN 2.6* 2.8* 2.8*    Gastrointestinal Profile: Last BM: 7/2   Nutrition-Focused Physical Exam Findings: Nutrition-Focused physical exam completed. Findings are WDL for fat depletion, muscle depletion, and edema.    Weight Change: Pt reports UBW between 118-121lbs.  RD notes these weights recorded in CHL. Anthropometrics:   Height:  Ht Readings from Last 1 Encounters:  03/21/15 5\' 1"  (1.549 m)    Weight:  Wt Readings from Last 1 Encounters:  03/21/15 121 lb (54.885 kg)     Wt Readings from Last 10 Encounters:  03/21/15 121 lb (54.885 kg)  03/19/15 118 lb 3.2 oz (53.615 kg)    BMI:  Body mass index is 22.87 kg/(m^2).  Estimated Nutritional Needs:  Kcal:  1631-1780kcals, BEE: 1141kcals, TEE: (IF 1.1-1.2)(AF 1.3)   Protein:  65-76g protein (1.2-1.4g/kg)  Fluid:  1620-186mL of fluid (25-4mL/kg)  Skin:  Reviewed, no issues  EDUCATION NEEDS:  Education needs no appropriate at this time   [place], RD,  LDN Pager [phone]

## 2015-03-23 NOTE — Plan of Care (Signed)
Problem: Discharge Progression Outcomes Goal: Other Discharge Outcomes/Goals Plan of Care Progress to Goal:  Pt tolerating full liquid diet well, pt continues with pain in abdomen, dilaudid given per orders with relief, pt c/o nausea x 1 no vomiting, zofran given per orders, will monitor

## 2015-03-23 NOTE — Plan of Care (Signed)
Problem: Discharge Progression Outcomes Goal: Other Discharge Outcomes/Goals Outcome: Progressing Afebrile. BP/HR stable. Morphine givenx2 for abd pain with improvement. No c/o n/v. NPO since midnight for DG UGI w/small.  Bowel. Scorred 0-2 on CIWA. Slept well.

## 2015-03-24 LAB — BASIC METABOLIC PANEL
Anion gap: 4 — ABNORMAL LOW (ref 5–15)
CALCIUM: 8.6 mg/dL — AB (ref 8.9–10.3)
CO2: 29 mmol/L (ref 22–32)
CREATININE: 0.54 mg/dL (ref 0.44–1.00)
Chloride: 109 mmol/L (ref 101–111)
GFR calc non Af Amer: >60 mL/min (ref >60)
GLUCOSE: 105 mg/dL — AB (ref 65–99)
Potassium: 3.5 mmol/L (ref 3.5–5.1)
Sodium: 142 mmol/L (ref 135–145)

## 2015-03-24 MED ORDER — LORAZEPAM 1 MG PO TABS
1.0000 mg | ORAL_TABLET | Freq: Four times a day (QID) | ORAL | Status: DC | PRN
  Administered 2015-03-25 – 2015-03-26 (×2): 1 mg via ORAL
  Filled 2015-03-24 (×3): qty 1

## 2015-03-24 MED ORDER — SODIUM CHLORIDE 0.9 % IJ SOLN
10.0000 mL | INTRAMUSCULAR | Status: DC | PRN
  Administered 2015-03-24 (×2): 10 mL via INTRAVENOUS
  Filled 2015-03-24: qty 10

## 2015-03-24 MED ORDER — SODIUM CHLORIDE 0.9 % IJ SOLN
10.0000 mL | INTRAMUSCULAR | Status: AC | PRN
  Administered 2015-03-24: 10 mL

## 2015-03-24 MED ORDER — HEPARIN SOD (PORK) LOCK FLUSH 100 UNIT/ML IV SOLN: 250.0000 [IU] | INTRAVENOUS | Status: DC | PRN

## 2015-03-24 MED ORDER — SODIUM CHLORIDE 0.9 % IV SOLN
INTRAVENOUS | Status: DC
  Administered 2015-03-24 – 2015-03-25 (×2): via INTRAVENOUS

## 2015-03-24 MED ORDER — NICOTINE 10 MG IN INHA
1.0000 | RESPIRATORY_TRACT | Status: DC | PRN
  Administered 2015-03-24: 1 via RESPIRATORY_TRACT
  Filled 2015-03-24: qty 36

## 2015-03-24 MED ORDER — LORAZEPAM 2 MG/ML IJ SOLN
1.0000 mg | Freq: Four times a day (QID) | INTRAMUSCULAR | Status: DC | PRN
  Administered 2015-03-25 (×3): 1 mg via INTRAVENOUS
  Filled 2015-03-24 (×3): qty 1

## 2015-03-24 MED ORDER — OXYCODONE HCL ER 10 MG PO T12A
10.0000 mg | EXTENDED_RELEASE_TABLET | Freq: Two times a day (BID) | ORAL | Status: DC
  Administered 2015-03-24 – 2015-03-26 (×5): 10 mg via ORAL
  Filled 2015-03-24 (×5): qty 1

## 2015-03-24 NOTE — Progress Notes (Signed)
[facility] at [place] NAME: Jacqulene Huntley    MR#:  [id number]  DATE OF BIRTH:  11-Oct-1965  SUBJECTIVE:  CHIEF COMPLAINT:   No chief complaint on file.  Still complains of pain once her diet restarted, was unable to do the upper GI due to the contrast material still noted in the small bowel according to radiology. She c/o nausea but not vomited any.  REVIEW OF SYSTEMS:   Review of Systems  Constitutional: Negative for fever.  Respiratory: Negative for shortness of breath.   Cardiovascular: Negative for chest pain and palpitations.  Gastrointestinal: Positive for nausea, vomiting and abdominal pain. Negative for diarrhea.  Genitourinary: Negative for dysuria.    DRUG ALLERGIES:   Allergies  Allergen Reactions  . Penicillins Rash    VITALS:  Blood pressure 131/79, pulse 75, temperature 97.7 F (36.5 C), temperature source Oral, resp. rate 18, height 5\' 1"  (1.549 m), weight 54.885 kg (121 lb), SpO2 95 %.  PHYSICAL EXAMINATION:  GENERAL:  49 y.o.-year-old patient lying in the bed, uncomfortable EYES: Pupils equal, round, reactive to light and accommodation. No scleral icterus. Extraocular muscles intact.  HEENT: Head atraumatic, normocephalic. Oropharynx and nasopharynx clear.  NECK:  Supple, no jugular venous distention. No thyroid enlargement, no tenderness.  LUNGS: Normal breath sounds bilaterally, no wheezing, rales,rhonchi or crepitation. No use of accessory muscles of respiration.  CARDIOVASCULAR: S1, S2 normal. No murmurs, rubs, or gallops.  ABDOMEN: Soft, diffusely tender, most in the upper quadrants and epigastric area, nondistended. Bowel sounds present. No organomegaly or mass.  EXTREMITIES: No pedal edema, cyanosis, or clubbing.  NEUROLOGIC: Cranial nerves II through XII are intact. Muscle strength 5/5 in all extremities. Sensation intact. Gait not checked.  PSYCHIATRIC: The patient is alert and oriented x 3.  SKIN: No  obvious rash, lesion, or ulcer.    LABORATORY PANEL:   CBC  Recent Labs Lab 03/22/15 0537  WBC 4.2  HGB 11.0*  HCT 32.1*  PLT 126*   ------------------------------------------------------------------------------------------------------------------  Chemistries   Recent Labs Lab 03/22/15 0537  03/24/15 0559  NA 142  < > 142  K 3.0*  < > 3.5  CL 109  < > 109  CO2 28  < > 29  GLUCOSE 87  < > 105*  BUN 5*  < > <5*  CREATININE 0.59  < > 0.54  CALCIUM 8.3*  < > 8.6*  MG 1.8  --   --   AST 47*  --   --   ALT 32  --   --   ALKPHOS 141*  --   --   BILITOT 0.9  --   --   < > = values in this interval not displayed. ------------------------------------------------------------------------------------------------------------------  Cardiac Enzymes No results for input(s): TROPONINI in the last 168 hours. ------------------------------------------------------------------------------------------------------------------  RADIOLOGY:  Dg Abd 1 View  03/23/2015   CLINICAL DATA:  Abdominal pain.  EXAM: ABDOMEN - 1 VIEW  COMPARISON:  CT 03/18/2015.  FINDINGS: Large amount of oral contrast from prior CT noted throughout the colon. Upper GI and small bowel can be rescheduled following clearing of contrast. No bowel distention.  IMPRESSION: Large amount of oral contrast from prior CT noted throughout the colon. Upper GI and small bowel follow-through can be rescheduled following clearing of contrast . No bowel distention.   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 Lysle Rubens he states he placed NGT  placed , due to patient being agiated they removed it. They also saw the gastric obstuction and duodenal Obstruction. Upper GI when tolerates,  We tried to upgrade the diet- she had very severe pain with that last night- so I swich back to clear liquids. Will add long acting oxycodon for better pain control.  #2 alcohol abuse with high risk for seizure versus DTs: - ciwa  #3 hypokalemia -Replaced with oral treatment  #4 hypertension: Due to alcohol withdrawal. Continue with CIWA protocol  All the records are reviewed and case discussed with Care Management/Social Workerr. Management plans discussed with the patient, family and they are in agreement.  CODE STATUS: full  TOTAL TIME TAKING CARE OF THIS PATIENT: 29min  . Vaughan Basta M.D on 03/24/2015 at 1:20 PM  Between 7am to 6pm - Pager - [phone]  After 6pm go to [email or web address] - password [email or web address] [facility]  [facility]  Office  [phone]  CC: Primary care physician; No primary care provider on file.

## 2015-03-24 NOTE — Progress Notes (Addendum)
Pt requesting pain medication-went to give scheduled oxycontin-patient states she will not be able to keep it down, I asked her if she would try it since she was able to drink soda.  She took the pain pill, i offered her zofran for nausea if she needed it, states she doesn't want anything else from me.

## 2015-03-24 NOTE — Plan of Care (Signed)
Problem: Discharge Progression Outcomes Goal: Other Discharge Outcomes/Goals Outcome: Progressing Pt with alcohol withdrawal tonight -restless/agitated/anxious, Ativan given x3 during the night with improvement. Labetalol given for elevated BP with good effect. Pain managed with dilaudidx3 and oxycodone once. No c/o n/v.

## 2015-03-24 NOTE — Plan of Care (Addendum)
Problem: Discharge Progression Outcomes Goal: Other Discharge Outcomes/Goals Outcome: Progressing Progress to goals: 1. Plans to return home with support of her husband. 2. Pt reports left upper constant abdominal pain with percocet and hydromorphine given with pain improved. Oxycontin started to be given every 12 hours. 3. VSS. Labs improved. No acute distress. 4. Anxiety- pt very anxious requiring ativan prn with CIWA scale stable with no acute withdrawal symptoms. Moods labile. Emotional support and high nursing time required to care for pt. 5. Ate waffle and eggs for breakfast with pt c/o she is hungry and wants to eat. Diet changed from full liquid to clear liquid due to pt's co's of abdominal pain to MD. Frequent co's she is hungry and wants to eat with multiple clear liquid choices offered and given to pt.IVF's restarted. 6. Up in room and to BR; hyperactivity of movements; restless at times. Unsteady at intervals requiring SB+.  Husband in.

## 2015-03-24 NOTE — Progress Notes (Signed)
Staffed with MD regarding observations, pt co's, wants to eat. No emesis. Tolerating diet well.

## 2015-03-24 NOTE — Progress Notes (Signed)
Pt observed by 2 CNA's [place] as she deactivated her bed alarm. Bed alarm reactivated with pt instructed not to turn bed alarm off and to call for assistance when needed. Advised her only nursing staff was to manage bed alarm for her safety. Stated she understood and would call.

## 2015-03-25 LAB — URINE DRUG SCREEN, QUALITATIVE (ARMC ONLY)
AMPHETAMINES, UR SCREEN: NOT DETECTED
BARBITURATES, UR SCREEN: NOT DETECTED
Benzodiazepine, Ur Scrn: POSITIVE — AB
COCAINE METABOLITE, UR ~~LOC~~: NOT DETECTED
Cannabinoid 50 Ng, Ur ~~LOC~~: POSITIVE — AB
MDMA (Ecstasy)Ur Screen: NOT DETECTED
Methadone Scn, Ur: NOT DETECTED
OPIATE, UR SCREEN: POSITIVE — AB
PHENCYCLIDINE (PCP) UR S: NOT DETECTED
Tricyclic, Ur Screen: NOT DETECTED

## 2015-03-25 MED ORDER — CALAMINE EX LOTN
TOPICAL_LOTION | Freq: Three times a day (TID) | CUTANEOUS | Status: DC
  Administered 2015-03-25: 21:00:00 via TOPICAL
  Filled 2015-03-25: qty 118

## 2015-03-25 MED ORDER — HYDROMORPHONE HCL 2 MG PO TABS
1.0000 mg | ORAL_TABLET | ORAL | Status: DC | PRN
  Administered 2015-03-26 (×2): 1 mg via ORAL
  Filled 2015-03-25 (×2): qty 1

## 2015-03-25 MED ORDER — LORAZEPAM 1 MG PO TABS
1.0000 mg | ORAL_TABLET | Freq: Once | ORAL | Status: AC
  Administered 2015-03-26: 1 mg via ORAL

## 2015-03-25 NOTE — Progress Notes (Signed)
[name] at [place] NAME: [name]    MR#:  [id number]  DATE OF BIRTH:  1966-05-24  SUBJECTIVE:  CHIEF COMPLAINT:   No chief complaint on file.  C/o abdominal pain, but next minute able to standup, and walk fine in lobby , talking to her friend and on phone without any trouble. Requesting me to start her diet, and if she is able to tolerate diet- she is fine to go home tomorrow.   REVIEW OF SYSTEMS:   Review of Systems  Constitutional: Negative for fever.  Respiratory: Negative for shortness of breath.   Cardiovascular: Negative for chest pain and palpitations.  Gastrointestinal: Negative for nausea, vomiting , positive for abdominal pain. Negative for diarrhea.  Genitourinary: Negative for dysuria.    DRUG ALLERGIES:   Allergies  Allergen Reactions  . Penicillins Rash    VITALS:  Blood pressure 160/94, pulse 82, temperature 97.5 F (36.4 C), temperature source Oral, resp. rate 18, height 5\' 1"  (1.549 m), weight 54.885 kg (121 lb), SpO2 100 %.  PHYSICAL EXAMINATION:  GENERAL:  49 y.o.-year-old patient lying in the bed, uncomfortable EYES: Pupils equal, round, reactive to light and accommodation. No scleral icterus. Extraocular muscles intact.  HEENT: Head atraumatic, normocephalic. Oropharynx and nasopharynx clear.  NECK:  Supple, no jugular venous distention. No thyroid enlargement, no tenderness.  LUNGS: Normal breath sounds bilaterally, no wheezing, rales,rhonchi or crepitation. No use of accessory muscles of respiration.  CARDIOVASCULAR: S1, S2 normal. No murmurs, rubs, or gallops.  ABDOMEN: Soft, diffusely tender, most in the upper quadrants and epigastric area, nondistended. Bowel sounds present. No organomegaly or mass.  EXTREMITIES: No pedal edema, cyanosis, or clubbing.  NEUROLOGIC: Cranial nerves II through XII are intact. Muscle strength 5/5 in all extremities. Sensation intact. Gait not checked.   PSYCHIATRIC: The patient is alert and oriented x 3.  SKIN: No obvious rash, lesion, or ulcer.    LABORATORY PANEL:   CBC  Recent Labs Lab 03/22/15 0537  WBC 4.2  HGB 11.0*  HCT 32.1*  PLT 126*   ------------------------------------------------------------------------------------------------------------------  Chemistries   Recent Labs Lab 03/22/15 0537  03/24/15 0559  NA 142  < > 142  K 3.0*  < > 3.5  CL 109  < > 109  CO2 28  < > 29  GLUCOSE 87  < > 105*  BUN 5*  < > <5*  CREATININE 0.59  < > 0.54  CALCIUM 8.3*  < > 8.6*  MG 1.8  --   --   AST 47*  --   --   ALT 32  --   --   ALKPHOS 141*  --   --   BILITOT 0.9  --   --   < > = values in this interval not displayed. ------------------------------------------------------------------------------------------------------------------  Cardiac Enzymes No results for input(s): TROPONINI in the last 168 hours. ------------------------------------------------------------------------------------------------------------------  RADIOLOGY:  No results found.   ASSESSMENT AND PLAN:   Principal Problem:   Alcohol use disorder, severe, dependence Active Problems:   Pancreatitis   Alcohol-induced depressive disorder with moderate or severe use disorder  #1 acute on chronic pancreatitis with pancreatic pseudocyst: S/p EUS which showed gastric stenosis, and dudonal stenosis, UGI series will be ordered for tomm I spoke to Dr. Lysle Rubens he states he placed NGT placed , due to patient being agiated they removed it. They also saw the gastric obstuction and duodenal Obstruction. Upper GI when tolerates,  add long acting oxycodon  for better pain control. Pt today said- she is tired of being on liquid diet for so long and want to eat- even if it interferes with er pancreas recovery. She agrees to follow with [facility] GI clinic. She does not want to have further work ups here, and if tolerates diet- will like to go home tomorrow.  #2  alcohol abuse with high risk for seizure versus DTs: - ciwa  #3 hypokalemia -Replaced with oral treatment  #4 hypertension: Due to alcohol withdrawal. Continue with CIWA protocol  All the records are reviewed and case discussed with Care Management/Social Workerr. Management plans discussed with the patient, family and they are in agreement.  CODE STATUS: full  TOTAL TIME TAKING CARE OF THIS PATIENT: 63min  . [name] M.D on 03/25/2015 at 2:35 PM  Between 7am to 6pm - Pager - [phone]  After 6pm go to [email or web address] - password [email or web address] [facility]  [place] [facility]  Office  [phone]  CC: Primary care physician; No primary care provider on file.

## 2015-03-25 NOTE — Plan of Care (Addendum)
Problem: Discharge Progression Outcomes Goal: Other Discharge Outcomes/Goals Outcome: Progressing Progress to goals: 1. Plans to return home with anticipated discharge tomorrow at this time. 2. Pain control improved with oxycontin and hydromorphone- pt more active, animated, hyperactive in room, out in hall, moving furniture in room, talking on phone. 3. VSS. Labs stable. 4. No other complication other than chronic pain noted. Pt continues to pick at PICC line dressing despite education and pink sleeve application. Dsg changed per protocol with pt frequently reminded not to pick at dressing. 5. Pt eating sunflower seeds, observed empty candy wrapper in chair, had food such as jar of pickles in closet. Diet advanced to regular with pt reporting tolerated well. 6. More steady today. Up ad lib and tolerating well. Pt instructed NOT to leave the unit with pt and husband verbalizing understanding. 7. Pt reports plans to FU at [facility] for further care as indicated.

## 2015-03-25 NOTE — Plan of Care (Signed)
Problem: Discharge Progression Outcomes Goal: Other Discharge Outcomes/Goals Outcome: Progressing Progress to goals: 1. Plans to return home with support of her husband. 2. Pt reports left upper constant abdominal pain with percocet and hydromorphine given with pain improved.  3. VSS. Labs improved. No acute distress. 4. Anxiety- pt very anxious requiring ativan prn with CIWA scale  5.pt reports being nauseated, no emesis noted  6. Up in room and to BR; hyperactivity of movements; restless at times. Unsteady at intervals requiring SB+.

## 2015-03-25 NOTE — Progress Notes (Signed)
  CONCERNING: IV to Oral Route Change Policy  RECOMMENDATION: This patient is receiving thiamine by the intravenous route.  Based on criteria approved by the Pharmacy and Therapeutics Committee, the intravenous medication(s) is/are being converted to the equivalent oral dose form(s).   DESCRIPTION: These criteria include:  The patient is eating (either orally or via tube) and/or has been taking other orally administered medications for a least 24 hours  The patient has no evidence of active gastrointestinal bleeding or impaired GI absorption (gastrectomy, short bowel, patient on TNA or NPO).  If you have questions about this conversion, please contact the Pharmacy Department  []   [phone] )  [facility] [x]   [phone] )  [facility] []   [phone] )  [facility] []   [phone] )  [facility] []   [phone] )  [place], [facility] 03/25/2015 1:26 PM

## 2015-03-26 MED ORDER — ONDANSETRON HCL 4 MG PO TABS: 4.0000 mg | ORAL_TABLET | Freq: Four times a day (QID) | ORAL | Status: DC | PRN

## 2015-03-26 MED ORDER — HYDROMORPHONE HCL 2 MG PO TABS: 1.0000 mg | ORAL_TABLET | Freq: Four times a day (QID) | ORAL | Status: DC | PRN

## 2015-03-26 MED ORDER — OXYCODONE HCL ER 10 MG PO T12A: 10.0000 mg | EXTENDED_RELEASE_TABLET | Freq: Two times a day (BID) | ORAL | Status: DC

## 2015-03-26 NOTE — Discharge Instructions (Addendum)
Please call and make appointment with [facility] GI clinic - [phone].

## 2015-03-26 NOTE — Plan of Care (Signed)
Problem: Discharge Progression Outcomes Goal: Discharge plan in place and appropriate Individualization  Outcome: Progressing Individualization of Care Hx of Asthma, chronic pancreatitis, history of drug use, alcohol abuse, hypothyroidism, depression, hepatitis C and COPD Goal: Pain controlled with appropriate interventions Outcome: Not Progressing Patient c/o pain consistently throughout shift.  Pain would be briefly resolved with pain medication and then patient would rate it 8-9/10.  Deep breathing and relaxation techniques used with no success.  Patient consistently agitated and acting inappropriately throughout shift.  Goal: Hemodynamically stable Outcome: Progressing BP 164/94 mmHg  Pulse 86  Temp(Src) 98.4 F (36.9 C) (Oral)  Resp 18  Ht 5\' 1"  (1.549 m)  Wt 121 lb (54.885 kg)  BMI 22.87 kg/m2  SpO2 98%

## 2015-03-26 NOTE — Progress Notes (Signed)
Entered room with primary RN caroline to watch pill administration, pt placed diluadid pill on her tongue took it back out of her mouth and stated " I am going to snort this" sniffed then said " no i wont" put it back it mouth and proceeded to chew the pill.

## 2015-03-26 NOTE — Discharge Summary (Signed)
[place] [facility] [place] NAME: [name]    MR#:  [id number]  DATE OF BIRTH:  1966/08/25  DATE OF ADMISSION:  03/21/2015 ADMITTING PHYSICIAN: Dustin Flock, MD  DATE OF DISCHARGE:03/26/2015  PRIMARY CARE PHYSICIAN: No primary care provider on file.    ADMISSION DIAGNOSIS:  Acute Pancreatitis  DISCHARGE DIAGNOSIS:  Principal Problem:   Alcohol use disorder, severe, dependence Active Problems:   Pancreatitis   Alcohol-induced depressive disorder with moderate or severe use disorder   Pancreatic cyst- drained at [facility].  SECONDARY DIAGNOSIS:   Past Medical History  Diagnosis Date  . Asthma   . Chronic pancreatitis   . History of drug use   . Alcohol abuse   . Hypothyroidism   . Depression   . Hepatitis C   . 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 Lysle Rubens he states he placed NGT placed , due to patient being agiated they removed it. They also saw the gastric obstuction and duodenal Obstruction. Upper GI when tolerates,  add long acting oxycodon for better pain control. Pt today said- she is tired of being on liquid diet for so long and want to eat- even if it interferes with er pancreas recovery. She agrees to follow with [facility] GI clinic. She does not want to have further work ups here,tolerated diet well with minimal pain- so discharge home.  #2 alcohol abuse with high risk for seizure versus DTs: - ciwa protocol continued - pt remained stable.  #3 hypokalemia -Replaced with oral treatment  #4 hypertension: Due to alcohol withdrawal. Continue with CIWA protocol   DISCHARGE CONDITIONS:   Stable.  CONSULTS OBTAINED:    GI- Dr.  Donnella Sham DRUG ALLERGIES:   Allergies  Allergen Reactions  . Penicillins Rash    DISCHARGE MEDICATIONS:   Current Discharge Medication List    START taking these medications   Details  HYDROmorphone (DILAUDID) 2 MG tablet Take 0.5 tablets (1 mg total) by mouth every 6 (six) hours as needed for severe pain. Qty: 15 tablet, Refills: 0    ondansetron (ZOFRAN) 4 MG tablet Take 1 tablet (4 mg total) by mouth every 6 (six) hours as needed for nausea. Qty: 20 tablet, Refills: 0    OxyCODONE (OXYCONTIN) 10 mg T12A 12 hr tablet Take 1 tablet (10 mg total) by mouth every 12 (twelve) hours. Qty: 20 tablet, Refills: 0      CONTINUE these medications which have NOT CHANGED   Details  albuterol (PROVENTIL HFA;VENTOLIN HFA) 108 (90 BASE) MCG/ACT inhaler Inhale 2 puffs into the lungs every 6 (six) hours as needed for wheezing or shortness of breath.    clonazePAM (KLONOPIN) 1 MG tablet Take 1 mg by mouth 3 (three) times daily as needed for anxiety.     DULoxetine (CYMBALTA) 60 MG capsule Take 1 capsule (60 mg total) by mouth daily. Qty: 30 capsule, Refills: 2    !! levothyroxine (SYNTHROID, LEVOTHROID) 200 MCG tablet Take 200 mcg by mouth daily. Pt takes with 50 mcg tablet.    !! levothyroxine (SYNTHROID, LEVOTHROID) 50 MCG tablet Take 50 mcg by mouth daily. Pt takes with 200 mcg tablet.     !! - Potential duplicate medications found. Please  discuss with provider.       DISCHARGE INSTRUCTIONS:    Follow with [facility] GI clinic.  If you experience worsening of your admission symptoms, develop shortness of breath, life threatening emergency, suicidal or homicidal thoughts you must seek medical attention immediately by calling 911 or calling your MD immediately  if symptoms less severe.  You Must read complete instructions/literature along with all the possible adverse reactions/side effects for all the Medicines you take and that have been prescribed to you. Take any new Medicines after you  have completely understood and accept all the possible adverse reactions/side effects.   Please note  You were cared for by a hospitalist during your hospital stay. If you have any questions about your discharge medications or the care you received while you were in the hospital after you are discharged, you can call the unit and asked to speak with the hospitalist on call if the hospitalist that took care of you is not available. Once you are discharged, your primary care physician will handle any further medical issues. Please note that NO REFILLS for any discharge medications will be authorized once you are discharged, as it is imperative that you return to your primary care physician (or establish a relationship with a primary care physician if you do not have one) for your aftercare needs so that they can reassess your need for medications and monitor your lab values.    Today   CHIEF COMPLAINT:  No chief complaint on file.   HISTORY OF PRESENT ILLNESS:  [name]  is a 49 y.o. female initially admitted on July 3 for acute on chronic pancreatitis. She was found to have a new pancreatic pseudocyst and as she had been seen and treated at [facility] for the same in the past she was transferred to [facility] today for endoscopy and return to [facility] this evening. Today she had EGD by Dr. Okey Dupre at [facility]. The prior gastric stent was removed. 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 Recommendations are for upper GI series to evaluate for possible surgical gastrojejunostomy. On readmission she is frustrated at being continued on a clear liquid diet. She is anxious and continues to show signs of alcohol withdrawal. She reports that her abdominal pain has resolved.    VITAL SIGNS:  Blood pressure 172/97,  pulse 108, temperature 97.9 F (36.6 C), temperature source Oral, resp. rate 20, height 5\' 1"  (1.549 m), weight 54.885 kg (121 lb), SpO2 100 %.  I/O:   Intake/Output Summary (Last 24 hours) at 03/26/15 0947 Last data filed at 03/26/15 0133  Gross per 24 hour  Intake   1220 ml  Output    800 ml  Net    420 ml    PHYSICAL EXAMINATION:  GENERAL: 49 y.o.-year-old patient lying in the bed, uncomfortable EYES: Pupils equal, round, reactive to light and accommodation. No scleral icterus. Extraocular muscles intact.  HEENT: Head atraumatic, normocephalic. Oropharynx and nasopharynx clear.  NECK: Supple, no jugular venous distention. No thyroid enlargement, no tenderness.  LUNGS: Normal breath sounds bilaterally, no wheezing, rales,rhonchi or crepitation. No use of accessory muscles of respiration.  CARDIOVASCULAR: S1, S2 normal. No murmurs, rubs, or gallops.  ABDOMEN: Soft, diffusely tender, most in the upper quadrants and epigastric area, nondistended. Bowel sounds present. No organomegaly or mass.  EXTREMITIES: No pedal edema, cyanosis, or clubbing.  NEUROLOGIC: Cranial nerves II through XII are intact. Muscle strength 5/5 in all extremities. Sensation intact. Gait not checked.  PSYCHIATRIC: The patient is alert and oriented x 3.  SKIN: No obvious rash, lesion, or ulcer.   DATA REVIEW:   CBC  Recent Labs Lab 03/22/15 0537  WBC 4.2  HGB 11.0*  HCT 32.1*  PLT 126*    Chemistries   Recent Labs Lab 03/22/15 0537  03/24/15 0559  NA 142  < > 142  K 3.0*  < > 3.5  CL 109  < > 109  CO2 28  < > 29  GLUCOSE 87  < > 105*  BUN 5*  < > <5*  CREATININE 0.59  < > 0.54  CALCIUM 8.3*  < > 8.6*  MG 1.8  --   --   AST 47*  --   --   ALT 32  --   --   ALKPHOS 141*  --   --   BILITOT 0.9  --   --   < > = values in this interval not displayed.  Cardiac Enzymes No results for input(s): TROPONINI in the last 168 hours.  Microbiology Results  Results for orders placed or  performed in visit on 03/15/12  Urine culture     Status: None   Collection Time: 03/15/12  2:52 AM  Result Value Ref Range Status   Micro Text Report   Final       SOURCE: UNKNOWN    ORGANISM 1                20,000 CFU/ML COAGULASE NEGATIVE STAPHYLOCOCCUS   ORGANISM 2                >100,000 CFU AEROBIC GRAM POS ROD,NO FURTHER ID   ANTIBIOTIC                                                        RADIOLOGY:  No results found.    Management plans discussed with the patient, family and they are in agreement.  CODE STATUS:     Code Status Orders        Start     Ordered   03/21/15 1918  Full code   Continuous     03/21/15 1917      TOTAL TIME TAKING CARE OF THIS PATIENT: 35 minutes.    Vaughan Basta M.D on 03/26/2015 at 9:47 AM  Between 7am to 6pm - Pager - [phone]  After 6pm go to [email or web address] - password [email or web address] [facility]  [place] [facility]  Office  [phone]  CC: Primary care physician; No primary care provider on file.

## 2015-03-26 NOTE — Progress Notes (Signed)
Patient discharged home per MD order. Instructed patient about alcohol use and pacreatitis. Patient declined getting further instructions and asked to move on from that topic. All questions answered, patient verbalized understanding. Pt walked out of unit.

## 2015-03-26 NOTE — Progress Notes (Signed)
Patient was increasingly agitated throughout shift.  Ativan administered at 21:15 p.m. With no result.  Patient was observed wringing her hands, pacing in room and alternating between crying and laughing.  During pain assessment at 22:30 patient confided to RN that she "did not feel safe" in the environment.  She stated, "At my admission to the floor the nurse who admitted me would give me my Dilaudid every two hours and would only give half the vial and take the other half herself."  RN asked assured patient that she was safe and that she would receive her medications fully and in a timely basis.  Pain medication (Dilaudid IV) was administered at 22:45 with patient stating that she wanted the RN to leave the hospital and drive her home.  Patient also asked nurse to call her husband and verify the account to him. RN refused and stated that should could not do this as she did not personally witness any of the events for which the patient had complaints. Patient further stated that she wished to leave AMA so RN left the room and have charge nurse address the situation.  RN related the conversation, tone and demeanor of patient to charge nurse at 22:45.  Phyllis accompanied RN to the room and the patient again told her story about a previous RN taking Dilaudid at admission with the patient.  Silva Bandy called the nursing supervisor Ann at this point to listen to the patient's complaint.  Ann arrived to room at 23:05.  Ann spoke calmly with the patient stressing that this was a serious offense and was going to be taken seriously.  She asked the patient for a concise and detailed accounting of exactly what had taken place at admission.  She asked for the name of the nurse who the patient was accusing of this offense.  At this point the patient became further agitated and called her husband.  Patient gave the phone to [facility] and asked her to tell her husband to come get her.  Ann had a conversation with the husband about  medications given to the patient during the evening and that the patient was requesting to leave AMA.  Ann repeated to the husband that the patient had the right to leave AMA as she was not involuntarily committed to the hospital.  At this point and after hearing this the patient became enraged.  She packed her belongings and started walking to the exit.  She was followed by the charge nurse who reminded her that the supervisor Lelon Frohlich had the patient's cell phone and was talking to her husband.  The patient returned to [facility] and physically grabbed the cell phone out of Ann's hand and from her ear.  She then turned around and left the floor to the exit followed by Silva Bandy and Lelon Frohlich who was calling security to meet them.   Outside the building the patient was observed trying to pull out her PICC line.  Phyllis removed the PICC to avoid patient injury.  The husband continued talking to the patient and convinced her to return to the floor for the remainder of the evening.  Physician was paged to change all medication to PO as the patient no longer had IV access.  Patient returned to room.  Behavior is not as agitated as previously, however patient continues to cry and request the nurses to take her home.  At 1:52 a.m. The RN, Chrys Racer entered the room to administer the patient's PO dilaudid for a pain scale  rating of 10/10 accompanied by Jarrett Soho.  When presenting the pill to the patient, the patient put the pill in her mouth and then spit it out and said, "I would rather snort this pill."  The nurse did not respond and the patient said "No, I'll take it."  She then proceeded to put the pill back in her mouth and both RNs observed her chew and then swallow the pill before leaving the room.

## 2015-07-07 ENCOUNTER — Emergency Department: Payer: Self-pay

## 2015-07-07 ENCOUNTER — Encounter: Payer: Self-pay | Admitting: Emergency Medicine

## 2015-07-07 ENCOUNTER — Emergency Department
Admission: EM | Admit: 2015-07-07 | Discharge: 2015-07-07 | Disposition: A | Discharge status: Home / Self Care | Payer: Self-pay | Attending: Emergency Medicine | Admitting: Emergency Medicine

## 2015-07-07 DIAGNOSIS — G8929 Other chronic pain: Secondary | ICD-10-CM | POA: Insufficient documentation

## 2015-07-07 DIAGNOSIS — R112 Nausea with vomiting, unspecified: Secondary | ICD-10-CM | POA: Insufficient documentation

## 2015-07-07 DIAGNOSIS — Z720 Tobacco use: Secondary | ICD-10-CM | POA: Insufficient documentation

## 2015-07-07 DIAGNOSIS — F1110 Opioid abuse, uncomplicated: Secondary | ICD-10-CM | POA: Insufficient documentation

## 2015-07-07 DIAGNOSIS — F1210 Cannabis abuse, uncomplicated: Secondary | ICD-10-CM | POA: Insufficient documentation

## 2015-07-07 DIAGNOSIS — Z880 Allergy status to penicillin: Secondary | ICD-10-CM | POA: Insufficient documentation

## 2015-07-07 DIAGNOSIS — Z79899 Other long term (current) drug therapy: Secondary | ICD-10-CM | POA: Insufficient documentation

## 2015-07-07 DIAGNOSIS — F1310 Sedative, hypnotic or anxiolytic abuse, uncomplicated: Secondary | ICD-10-CM | POA: Insufficient documentation

## 2015-07-07 DIAGNOSIS — F1910 Other psychoactive substance abuse, uncomplicated: Secondary | ICD-10-CM

## 2015-07-07 DIAGNOSIS — F1410 Cocaine abuse, uncomplicated: Secondary | ICD-10-CM | POA: Insufficient documentation

## 2015-07-07 HISTORY — DX: Unspecified convulsions: R56.9

## 2015-07-07 HISTORY — DX: Malignant (primary) neoplasm, unspecified: C80.1

## 2015-07-07 LAB — CBC WITH DIFFERENTIAL/PLATELET
Basophils Absolute: 0 10*3/uL (ref 0–0.1)
Basophils Relative: 1 %
Eosinophils Absolute: 0.1 10*3/uL (ref 0–0.7)
Eosinophils Relative: 1 %
HCT: 41 % (ref 35.0–47.0)
Hemoglobin: 14.1 g/dL (ref 12.0–16.0)
Lymphocytes Relative: 22 %
Lymphs Abs: 1.5 10*3/uL (ref 1.0–3.6)
MCH: 37 pg — ABNORMAL HIGH (ref 26.0–34.0)
MCHC: 34.4 g/dL (ref 32.0–36.0)
MCV: 107.5 fL — ABNORMAL HIGH (ref 80.0–100.0)
Monocytes Absolute: 0.5 10*3/uL (ref 0.2–0.9)
Monocytes Relative: 7 %
Neutro Abs: 4.8 10*3/uL (ref 1.4–6.5)
Neutrophils Relative %: 69 %
Platelets: 216 10*3/uL (ref 150–440)
RBC: 3.81 MIL/uL (ref 3.80–5.20)
RDW: 15.1 % — ABNORMAL HIGH (ref 11.5–14.5)
WBC: 6.9 10*3/uL (ref 3.6–11.0)

## 2015-07-07 LAB — URINE DRUG SCREEN, QUALITATIVE (ARMC ONLY)
AMPHETAMINES, UR SCREEN: NOT DETECTED
Barbiturates, Ur Screen: NOT DETECTED
Benzodiazepine, Ur Scrn: POSITIVE — AB
COCAINE METABOLITE, UR ~~LOC~~: POSITIVE — AB
Cannabinoid 50 Ng, Ur ~~LOC~~: POSITIVE — AB
MDMA (Ecstasy)Ur Screen: NOT DETECTED
Methadone Scn, Ur: NOT DETECTED
Opiate, Ur Screen: POSITIVE — AB
PHENCYCLIDINE (PCP) UR S: NOT DETECTED
TRICYCLIC, UR SCREEN: NOT DETECTED

## 2015-07-07 LAB — URINALYSIS COMPLETE WITH MICROSCOPIC (ARMC ONLY)
BACTERIA UA: NONE SEEN
BILIRUBIN URINE: NEGATIVE
GLUCOSE, UA: NEGATIVE mg/dL
Hgb urine dipstick: NEGATIVE
Ketones, ur: NEGATIVE mg/dL
Leukocytes, UA: NEGATIVE
NITRITE: NEGATIVE
Protein, ur: NEGATIVE mg/dL
RBC / HPF: NONE SEEN RBC/hpf (ref 0–5)
SPECIFIC GRAVITY, URINE: 1.003 — AB (ref 1.005–1.030)
pH: 6 (ref 5.0–8.0)

## 2015-07-07 LAB — COMPREHENSIVE METABOLIC PANEL
ALT: 38 U/L (ref 14–54)
AST: 62 U/L — AB (ref 15–41)
Albumin: 4.2 g/dL (ref 3.5–5.0)
Alkaline Phosphatase: 103 U/L (ref 38–126)
Anion gap: 11 (ref 5–15)
BILIRUBIN TOTAL: 0.5 mg/dL (ref 0.3–1.2)
BUN: 9 mg/dL (ref 6–20)
CALCIUM: 9.3 mg/dL (ref 8.9–10.3)
CO2: 23 mmol/L (ref 22–32)
CREATININE: 0.59 mg/dL (ref 0.44–1.00)
Chloride: 102 mmol/L (ref 101–111)
GFR calc Af Amer: >60 mL/min (ref >60)
Glucose, Bld: 88 mg/dL (ref 65–99)
POTASSIUM: 3.2 mmol/L — AB (ref 3.5–5.1)
Sodium: 136 mmol/L (ref 135–145)
TOTAL PROTEIN: 8.8 g/dL — AB (ref 6.5–8.1)

## 2015-07-07 LAB — LIPASE, BLOOD: LIPASE: 25 U/L (ref 11–51)

## 2015-07-07 LAB — ETHANOL: Alcohol, Ethyl (B): 44 mg/dL — ABNORMAL HIGH (ref <5)

## 2015-07-07 MED ORDER — ONDANSETRON HCL 4 MG/2ML IJ SOLN
4.0000 mg | Freq: Once | INTRAMUSCULAR | Status: AC
  Administered 2015-07-07: 4 mg via INTRAVENOUS
  Filled 2015-07-07: qty 2

## 2015-07-07 MED ORDER — KETOROLAC TROMETHAMINE 30 MG/ML IJ SOLN
30.0000 mg | Freq: Once | INTRAMUSCULAR | Status: DC
Start: 2015-07-07 — End: 2015-07-07
  Filled 2015-07-07: qty 1

## 2015-07-07 MED ORDER — MORPHINE SULFATE (PF) 2 MG/ML IV SOLN
2.0000 mg | Freq: Once | INTRAVENOUS | Status: AC
  Administered 2015-07-07: 2 mg via INTRAVENOUS
  Filled 2015-07-07: qty 1

## 2015-07-07 MED ORDER — SODIUM CHLORIDE 0.9 % IV BOLUS (SEPSIS)
1000.0000 mL | Freq: Once | INTRAVENOUS | Status: AC
  Administered 2015-07-07: 1000 mL via INTRAVENOUS

## 2015-07-07 NOTE — ED Notes (Signed)
Pt given phone to attempt to call for ride; Discussed with MD to send patient out to lobby, unable to send to lobby in pt's current condition.

## 2015-07-07 NOTE — ED Notes (Signed)
Patient transported to X-ray 

## 2015-07-07 NOTE — ED Provider Notes (Signed)
[facility] Emergency Department Provider Note  ____________________________________________  Time seen: 5:00 AM  I have reviewed the triage vital signs and the nursing notes.   HISTORY  Chief Complaint Abdominal Pain     HPI [name] is a 49 y.o. female [name] with epigastric abdominal pain 3 days accompanied by nausea and vomiting that is nonbloody. Patient states her current pain score is 10 out of 10 and stabbing in her epigastrium radiating to her left side. Patient admits to a history of pancreatitis in the past as well as hepatitis C. Patient states that she is only taking Tylenol and ibuprofen for her pain at home and no other medications for pain. Patient states that she has not taken any other medication for pain since many months ago". She admits to "few sips of alcohol last night"area patient denies any illicit drug use.     Past Medical History  Diagnosis Date  . Asthma   . Chronic pancreatitis ([place])   . History of drug use ([place])   . Alcohol abuse   . Hypothyroidism   . Depression   . Hepatitis C   . COPD (chronic obstructive pulmonary disease) ([place])   . Cancer ([facility])     "carcinoma of throat" with s/p thyroidectomy  . Seizures [facility])     Patient Active Problem List   Diagnosis Date Noted  . Alcohol use disorder, severe, dependence ([place]) 03/22/2015  . 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 Pancreatitis 03/21/2015  . Chronic pancreatitis ([place]) 03/18/2015  . Pancreatic pseudocyst 03/18/2015  . Alcohol abuse 03/18/2015  . Hypothyroidism 03/18/2015  . Depression 03/18/2015  . Abdominal pain, epigastric 03/18/2015    Past Surgical History  Procedure Laterality Date  . Total thyroidectomy    . Cesarean section    . Gastrostomy tube placement  2011    Current Outpatient Rx  Name  Route  Sig  Dispense  Refill  . albuterol (PROVENTIL HFA;VENTOLIN HFA) 108 (90 BASE) MCG/ACT  inhaler   Inhalation   Inhale 2 puffs into the lungs every 6 (six) hours as needed for wheezing or shortness of breath.         . clonazePAM (KLONOPIN) 1 MG tablet   Oral   Take 1 mg by mouth 3 (three) times daily as needed for anxiety.          . DULoxetine (CYMBALTA) 60 MG capsule   Oral   Take 1 capsule (60 mg total) by mouth daily.   30 capsule   2   . HYDROmorphone (DILAUDID) 2 MG tablet   Oral   Take 0.5 tablets (1 mg total) by mouth every 6 (six) hours as needed for severe pain.   15 tablet   0   . levothyroxine (SYNTHROID, LEVOTHROID) 200 MCG tablet   Oral   Take 200 mcg by mouth daily. Pt takes with 50 mcg tablet.         Marland Kitchen levothyroxine (SYNTHROID, LEVOTHROID) 50 MCG tablet   Oral   Take 50 mcg by mouth daily. Pt takes with 200 mcg tablet.         . ondansetron (ZOFRAN) 4 MG tablet   Oral   Take 1 tablet (4 mg total) by mouth every 6 (six) hours as needed for nausea.   20 tablet   0   . OxyCODONE (OXYCONTIN) 10 mg T12A 12 hr tablet   Oral   Take 1 tablet (10 mg total)  by mouth every 12 (twelve) hours.   20 tablet   0     Allergies Penicillins  Family History  Problem Relation Age of Onset  . Diabetes Mellitus II Maternal Aunt   . Pancreatic cancer Paternal Aunt   . Diabetes Mellitus II Maternal Grandfather   . CAD Maternal Grandfather   . Diabetes Mellitus II Mother   . Liver cancer Mother   . Arthritis Mother     Social History Social History  Substance Use Topics  . Smoking status: Current Every Day Smoker -- 1.00 packs/day    Types: Cigarettes  . Smokeless tobacco: None  . Alcohol Use: 1.8 oz/week    0 Standard drinks or equivalent, 3 Glasses of wine per week    Review of Systems  Constitutional: Negative for fever. Eyes: Negative for visual changes. ENT: Negative for sore throat. Cardiovascular: Negative for chest pain. Respiratory: Negative for shortness of breath. Gastrointestinal: Positive for abdominal pain,  vomiting. Genitourinary: Negative for dysuria. Musculoskeletal: Negative for back pain. Skin: Negative for rash. Neurological: Negative for headaches, focal weakness or numbness.   10-point ROS otherwise negative.  ____________________________________________   PHYSICAL EXAM:  VITAL SIGNS: ED Triage Vitals  Enc Vitals Group     BP 07/07/15 0445 127/98 mmHg     Pulse Rate 07/07/15 0445 90     Resp 07/07/15 0445 21     Temp 07/07/15 0445 97.9 F (36.6 C)     Temp Source 07/07/15 0445 Oral     SpO2 07/07/15 0445 94 %     Weight 07/07/15 0445 117 lb (53.071 kg)     Height 07/07/15 0445 5\' 1"  (1.549 m)     Head Cir --      Peak Flow --      Pain Score 07/07/15 0446 10     Pain Loc --      Pain Edu? --      Excl. in [place]? --      Constitutional: Alert and oriented. Well appearing and in no distress.  Eyes: Conjunctivae are normal. PERRL. Normal extraocular movements. ENT   Head: Normocephalic and atraumatic.   Nose: No congestion/rhinnorhea.   Mouth/Throat: Mucous membranes are moist.   Neck: No stridor. Cardiovascular: Normal rate, regular rhythm. Normal and symmetric distal pulses are present in all extremities. No murmurs, rubs, or gallops. Respiratory: Normal respiratory effort without tachypnea nor retractions. Breath sounds are clear and equal bilaterally. No wheezes/rales/rhonchi. Gastrointestinal: Tender to very gentle palpation diffusely.  distention. There is no CVA tenderness. Genitourinary: deferred Musculoskeletal: Nontender with normal range of motion in all extremities. No joint effusions.  No lower extremity tenderness nor edema. Neurologic:  Normal speech and language. No gross focal neurologic deficits are appreciated. Speech is normal.  Skin:  Skin is warm, dry and intact. No rash noted. Psychiatric: Mood and affect are normal. Speech and behavior are normal. Patient exhibits appropriate insight and  judgment.  ____________________________________________    LABS (pertinent positives/negatives)  Labs Reviewed  CBC WITH DIFFERENTIAL/PLATELET - Abnormal; Notable for the following:    MCV 107.5 (*)    MCH 37.0 (*)    RDW 15.1 (*)    All other components within normal limits  COMPREHENSIVE METABOLIC PANEL - Abnormal; Notable for the following:    Potassium 3.2 (*)    Total Protein 8.8 (*)    AST 62 (*)    All other components within normal limits  URINALYSIS COMPLETEWITH MICROSCOPIC ([facility] ONLY) - Abnormal; Notable for the following:  Color, Urine STRAW (*)    APPearance HAZY (*)    Specific Gravity, Urine 1.003 (*)    Squamous Epithelial / LPF 6-30 (*)    All other components within normal limits  ETHANOL - Abnormal; Notable for the following:    Alcohol, Ethyl (B) 44 (*)    All other components within normal limits  URINE DRUG SCREEN, QUALITATIVE ([facility] ONLY) - Abnormal; Notable for the following:    Cocaine Metabolite,Ur [place] POSITIVE (*)    Opiate, Ur Screen POSITIVE (*)    Cannabinoid 50 Ng, Ur [place] POSITIVE (*)    Benzodiazepine, Ur Scrn POSITIVE (*)    All other components within normal limits  LIPASE, BLOOD     ____________________________________________   EKG  ED ECG REPORT I, BROWN, [place] N, the attending physician, personally viewed and interpreted this ECG.   Date: 07/07/2015  EKG Time: 4:31 AM  Rate: 91  Rhythm: Normal sinus rhythm  Axis: None  Intervals: Normal  ST&T Change: None   ____________________________________________    RADIOLOGY  DG Chest 2 View (Final result) Result time: 07/07/15 06:40:31   Final result by Rad Results In Interface (07/07/15 06:40:31)   Narrative:   CLINICAL DATA: Acute onset of left-sided abdominal pain, radiating to the back. Vomiting and nausea. Abdominal distention. Initial encounter.  EXAM: CHEST 2 VIEW  COMPARISON: Chest radiograph from 03/20/2015  FINDINGS: The lungs are well-aerated and  clear. There is no evidence of focal opacification, pleural effusion or pneumothorax.  The heart is normal in size; the mediastinal contour is within normal limits. No acute osseous abnormalities are seen.  IMPRESSION: No acute cardiopulmonary process seen.   Electronically Signed By: Garald Balding M.D. On: 07/07/2015 06:40        INITIAL IMPRESSION / ASSESSMENT AND PLAN / ED COURSE  Pertinent labs & imaging results that were available during my care of the patient were reviewed by me and considered in my medical decision making (see chart for details).    ____________________________________________   FINAL CLINICAL IMPRESSION(S) / ED DIAGNOSES  Final diagnoses:  Chronic pain  Polysubstance abuse      Gregor Hams, MD 07/07/15 [phone]

## 2015-07-07 NOTE — ED Notes (Signed)
Pt. returned from XR. 

## 2015-07-07 NOTE — ED Notes (Signed)
Patient presents to Emergency Department via EMS with complaints of abdominal pain for the last 3 days.  Pt states intermitant vomiting and nausea, obvious distension to abdomen (partial herniation with cough), 10/10 stabbing pain in left side radiating to back, hx of pancreatitis and HEP C (per EMS).  Pt admits to ETOH use tonight.

## 2015-07-07 NOTE — Discharge Instructions (Signed)
Chronic Pain  Chronic pain can be defined as pain that is off and on and lasts for 3-6 months or longer. Many things cause chronic pain, which can make it difficult to make a diagnosis. There are many treatment options available for chronic pain. However, finding a treatment that works well for you may require trying various approaches until the right one is found. Many people benefit from a combination of two or more types of treatment to control their pain.  SYMPTOMS   Chronic pain can occur anywhere in the body and can range from mild to very severe. Some types of chronic pain include:  · Headache.  · Low back pain.  · Cancer pain.  · Arthritis pain.  · Neurogenic pain. This is pain resulting from damage to nerves.   People with chronic pain may also have other symptoms such as:  · Depression.  · Anger.  · Insomnia.  · Anxiety.  DIAGNOSIS   Your health care provider will help diagnose your condition over time. In many cases, the initial focus will be on excluding possible conditions that could be causing the pain. Depending on your symptoms, your health care provider may order tests to diagnose your condition. Some of these tests may include:   · Blood tests.    · CT scan.    · MRI.    · X-rays.    · Ultrasounds.    · Nerve conduction studies.    You may need to see a specialist.   TREATMENT   Finding treatment that works well may take time. You may be referred to a pain specialist. He or she may prescribe medicine or therapies, such as:   · Mindful meditation or yoga.  · Shots (injections) of numbing or pain-relieving medicines into the spine or area of pain.  · Local electrical stimulation.  · Acupuncture.    · Massage therapy.    · Aroma, color, light, or sound therapy.    · Biofeedback.    · Working with a physical therapist to keep from getting stiff.    · Regular, gentle exercise.    · Cognitive or behavioral therapy.    · Group support.    Sometimes, surgery may be recommended.   HOME CARE INSTRUCTIONS    · Take all medicines as directed by your health care provider.    · Lessen stress in your life by relaxing and doing things such as listening to calming music.    · Exercise or be active as directed by your health care provider.    · Eat a healthy diet and include things such as vegetables, fruits, fish, and lean meats in your diet.    · Keep all follow-up appointments with your health care provider.    · Attend a support group with others suffering from chronic pain.  SEEK MEDICAL CARE IF:   · Your pain gets worse.    · You develop a new pain that was not there before.    · You cannot tolerate medicines given to you by your health care provider.    · You have new symptoms since your last visit with your health care provider.    SEEK IMMEDIATE MEDICAL CARE IF:   · You feel weak.    · You have decreased sensation or numbness.    · You lose control of bowel or bladder function.    · Your pain suddenly gets much worse.    · You develop shaking.  · You develop chills.  · You develop confusion.  · You develop chest pain.  · You develop shortness of breath.    MAKE SURE YOU:  ·   Document Revised: 05/04/2013 Document Reviewed: 02/25/2013 Elsevier Interactive Patient Education 2016 [place] Abuse When people abuse more than one drug or type of drug it is called polysubstance or polydrug abuse. For example, many smokers also drink alcohol. This is one form of polydrug abuse. Polydrug abuse also refers to the use of a drug to counteract an unpleasant effect produced by another drug. It may also be used to help with withdrawal from another drug. People who take  stimulants may become agitated. Sometimes this agitation is countered with a tranquilizer. This helps protect against the unpleasant side effects. Polydrug abuse also refers to the use of different drugs at the same time.  Anytime drug use is interfering with normal living activities, it has become abuse. This includes problems with family and friends. Psychological dependence has developed when your mind tells you that the drug is needed. This is usually followed by physical dependence which has developed when continuing increases of drug are required to get the same feeling or "high". This is known as addiction or chemical dependency. A person's risk is much higher if there is a history of chemical dependency in the family. SIGNS OF CHEMICAL DEPENDENCY  You have been told by friends or family that drugs have become a problem.  You fight when using drugs.  You are having blackouts (not remembering what you do while using).  You feel sick from using drugs but continue using.  You lie about use or amounts of drugs (chemicals) used.  You need chemicals to get you going.  You are suffering in work performance or in school because of drug use.  You get sick from use of drugs but continue to use anyway.  You need drugs to relate to people or feel comfortable in social situations.  You use drugs to forget problems. "Yes" answered to any of the above signs of chemical dependency indicates there are problems. The longer the use of drugs continues, the greater the problems will become. If there is a family history of drug or alcohol use, it is best not to experiment with these drugs. Continual use leads to tolerance. After tolerance develops more of the drug is needed to get the same feeling. This is followed by addiction. With addiction, drugs become the most important part of life. It becomes more important to take drugs than participate in the other usual activities of life. This includes relating  to friends and family. Addiction is followed by dependency. Dependency is a condition where drugs are now needed not just to get high, but to feel normal. Addiction cannot be cured but it can be stopped. This often requires outside help and the care of professionals. Treatment centers are listed in the yellow pages under: Cocaine, Narcotics, and Alcoholics Anonymous. Most hospitals and clinics can refer you to a specialized care center. Talk to your caregiver if you need help.   This information is not intended to replace advice given to you by your health care provider. Make sure you discuss any questions you have with your health care provider.   Document Released: 04/23/2005 Document Revised: 11/24/2011 Document Reviewed: 09/06/2014 Elsevier Interactive Patient Education [employer].

## 2015-07-20 DIAGNOSIS — G894 Chronic pain syndrome: Secondary | ICD-10-CM | POA: Insufficient documentation

## 2015-07-20 DIAGNOSIS — B1920 Unspecified viral hepatitis C without hepatic coma: Secondary | ICD-10-CM

## 2015-07-20 DIAGNOSIS — J449 Chronic obstructive pulmonary disease, unspecified: Secondary | ICD-10-CM

## 2015-07-23 ENCOUNTER — Telehealth: Payer: Self-pay | Admitting: Unknown Physician Specialty

## 2015-07-23 ENCOUNTER — Encounter: Payer: Self-pay | Admitting: Unknown Physician Specialty

## 2015-07-23 ENCOUNTER — Ambulatory Visit (INDEPENDENT_AMBULATORY_CARE_PROVIDER_SITE_OTHER): Payer: Self-pay | Admitting: Unknown Physician Specialty

## 2015-07-23 VITALS — BP 123/87 | HR 101 | Temp 98.3°F | Ht 62.1 in | Wt 114.2 lb

## 2015-07-23 DIAGNOSIS — E039 Hypothyroidism, unspecified: Secondary | ICD-10-CM

## 2015-07-23 DIAGNOSIS — F32A Depression, unspecified: Secondary | ICD-10-CM

## 2015-07-23 DIAGNOSIS — F1910 Other psychoactive substance abuse, uncomplicated: Secondary | ICD-10-CM

## 2015-07-23 DIAGNOSIS — F329 Major depressive disorder, single episode, unspecified: Secondary | ICD-10-CM

## 2015-07-23 MED ORDER — LEVOTHYROXINE SODIUM 200 MCG PO TABS: 200.0000 ug | ORAL_TABLET | Freq: Every day | ORAL | Status: DC

## 2015-07-23 MED ORDER — DULOXETINE HCL 60 MG PO CPEP: 60.0000 mg | ORAL_CAPSULE | Freq: Every day | ORAL | Status: DC

## 2015-07-23 MED ORDER — LEVOTHYROXINE SODIUM 50 MCG PO TABS: 50.0000 ug | ORAL_TABLET | Freq: Every day | ORAL | Status: DC

## 2015-07-23 NOTE — Progress Notes (Signed)
BP 123/87 mmHg  Pulse 101  Temp(Src) 98.3 F (36.8 C)  Ht 5' 2.1" (1.577 m)  Wt 114 lb 3.2 oz (51.801 kg)  BMI 20.83 kg/m2  SpO2 97%  LMP  (LMP Unknown)   Subjective:    Patient ID: [name], female    DOB: Oct 31, 1965, 49 y.o.   MRN: [id number]  HPI: [name] is a 49 y.o. female  Chief Complaint  Patient presents with  . Medication Refill    pt states she needs refills on levothyroxine and cymbalta   Pt is taking 200 mcg pill plus chewing off about 1/4 to make 250 mcgs.  States she has been taking it this way for months.  She was in the hospital for pancreatitis in July.   Depression Stable.  Increased form 30 to 60 mg last visit which helped.  She is quick to anger but is menopausal.  Did note positive drug screen at the end of October from the ER.  Pt off drugs now.    Depression screen PHQ 2/9 07/23/2015  Decreased Interest 1  Down, Depressed, Hopeless 0  PHQ - 2 Score 1  Altered sleeping 2  Tired, decreased energy 3  Change in appetite 2  Feeling bad or failure about yourself  0  Trouble concentrating 1  Moving slowly or fidgety/restless 0  Suicidal thoughts 0  PHQ-9 Score 9      Relevant past medical, surgical, family and social history reviewed and updated as indicated. Interim medical history since our last visit reviewed. Allergies and medications reviewed and updated.  Review of Systems  Per HPI unless specifically indicated above     Objective:    BP 123/87 mmHg  Pulse 101  Temp(Src) 98.3 F (36.8 C)  Ht 5' 2.1" (1.577 m)  Wt 114 lb 3.2 oz (51.801 kg)  BMI 20.83 kg/m2  SpO2 97%  LMP  (LMP Unknown)  Wt Readings from Last 3 Encounters:  07/23/15 114 lb 3.2 oz (51.801 kg)  12/22/14 117 lb (53.071 kg)  07/07/15 117 lb (53.071 kg)    Physical Exam  Constitutional: She is oriented to person, place, and time. She appears well-developed and well-nourished. No distress.  HENT:  Head: Normocephalic and atraumatic.  Eyes: Conjunctivae and  lids are normal. Right eye exhibits no discharge. Left eye exhibits no discharge. No scleral icterus.  Cardiovascular: Normal rate, regular rhythm and normal heart sounds.   Pulmonary/Chest: Effort normal and breath sounds normal. No respiratory distress.  Abdominal: Normal appearance. There is no splenomegaly or hepatomegaly.  Musculoskeletal: Normal range of motion.  Neurological: She is alert and oriented to person, place, and time.  Skin: Skin is intact. No rash noted. No pallor.  Psychiatric: She has a normal mood and affect. Her behavior is normal. Judgment and thought content normal.    Assessment & Plan:   Problem List Items Addressed This Visit      Unprioritized   Hypothyroidism - Primary    Taking a inconsistent dose.  Will rx one 200 mcg and one 50 mcg for 6 weeks.        Relevant Medications   levothyroxine (SYNTHROID, LEVOTHROID) 200 MCG tablet   levothyroxine (SYNTHROID, LEVOTHROID) 50 MCG tablet   Depression    Continue Cymbalta      Substance abuse    No controlled substances           Follow up plan: Return in about 6 weeks (around 09/03/2015).

## 2015-07-23 NOTE — Assessment & Plan Note (Signed)
Continue Cymbalta.

## 2015-07-23 NOTE — Telephone Encounter (Signed)
Pt called stated pharmacy did not receive the RX for Cymbalta. Pt is out of this medication. Please send to [facility]. Thanks.

## 2015-07-23 NOTE — Telephone Encounter (Signed)
Routing to provider. According to chart, it was not refilled earlier at visit. Pharmacy is [facility].

## 2015-07-23 NOTE — Assessment & Plan Note (Signed)
Taking a inconsistent dose.  Will rx one 200 mcg and one 50 mcg for 6 weeks.

## 2015-07-23 NOTE — Assessment & Plan Note (Signed)
No controlled substances

## 2015-07-23 NOTE — Addendum Note (Signed)
Addended by: Kathrine Haddock on: 07/23/2015 05:08 PM   Modules accepted: Orders

## 2015-08-29 ENCOUNTER — Ambulatory Visit: Payer: Self-pay | Admitting: Unknown Physician Specialty

## 2015-08-29 ENCOUNTER — Telehealth: Payer: Self-pay | Admitting: Unknown Physician Specialty

## 2015-08-29 NOTE — Telephone Encounter (Signed)
Pt called stated she needs a refill on Cymbalta. Pharm is [facility]. Thanks.

## 2015-08-29 NOTE — Telephone Encounter (Signed)
Called pharmacy because patient should still have plenty of refills for medication. They stated that she did have refills so I called and left her a voicemail stating that information.

## 2015-09-03 ENCOUNTER — Ambulatory Visit: Payer: Self-pay | Admitting: Unknown Physician Specialty

## 2015-11-28 ENCOUNTER — Emergency Department: Payer: Self-pay

## 2015-11-28 ENCOUNTER — Inpatient Hospital Stay
Admission: EM | Admit: 2015-11-28 | Discharge: 2015-11-30 | DRG: 897 | Disposition: A | Discharge status: Home / Self Care | Payer: Self-pay | Attending: Internal Medicine | Admitting: Internal Medicine

## 2015-11-28 ENCOUNTER — Encounter: Payer: Self-pay | Admitting: Medical Oncology

## 2015-11-28 ENCOUNTER — Emergency Department: Payer: MEDICAID

## 2015-11-28 DIAGNOSIS — F10239 Alcohol dependence with withdrawal, unspecified: Principal | ICD-10-CM | POA: Diagnosis present

## 2015-11-28 DIAGNOSIS — B182 Chronic viral hepatitis C: Secondary | ICD-10-CM | POA: Diagnosis present

## 2015-11-28 DIAGNOSIS — F419 Anxiety disorder, unspecified: Secondary | ICD-10-CM | POA: Diagnosis present

## 2015-11-28 DIAGNOSIS — E039 Hypothyroidism, unspecified: Secondary | ICD-10-CM | POA: Diagnosis present

## 2015-11-28 DIAGNOSIS — F329 Major depressive disorder, single episode, unspecified: Secondary | ICD-10-CM | POA: Diagnosis present

## 2015-11-28 DIAGNOSIS — J449 Chronic obstructive pulmonary disease, unspecified: Secondary | ICD-10-CM | POA: Diagnosis present

## 2015-11-28 DIAGNOSIS — G8929 Other chronic pain: Secondary | ICD-10-CM | POA: Diagnosis present

## 2015-11-28 DIAGNOSIS — J45909 Unspecified asthma, uncomplicated: Secondary | ICD-10-CM | POA: Diagnosis present

## 2015-11-28 DIAGNOSIS — K6289 Other specified diseases of anus and rectum: Secondary | ICD-10-CM | POA: Diagnosis present

## 2015-11-28 DIAGNOSIS — K861 Other chronic pancreatitis: Secondary | ICD-10-CM | POA: Diagnosis present

## 2015-11-28 DIAGNOSIS — K625 Hemorrhage of anus and rectum: Secondary | ICD-10-CM | POA: Diagnosis present

## 2015-11-28 DIAGNOSIS — R140 Abdominal distension (gaseous): Secondary | ICD-10-CM

## 2015-11-28 DIAGNOSIS — K863 Pseudocyst of pancreas: Secondary | ICD-10-CM | POA: Diagnosis present

## 2015-11-28 DIAGNOSIS — Z880 Allergy status to penicillin: Secondary | ICD-10-CM

## 2015-11-28 DIAGNOSIS — K839 Disease of biliary tract, unspecified: Secondary | ICD-10-CM

## 2015-11-28 DIAGNOSIS — R109 Unspecified abdominal pain: Secondary | ICD-10-CM

## 2015-11-28 DIAGNOSIS — Z85819 Personal history of malignant neoplasm of unspecified site of lip, oral cavity, and pharynx: Secondary | ICD-10-CM

## 2015-11-28 DIAGNOSIS — F1029 Alcohol dependence with unspecified alcohol-induced disorder: Secondary | ICD-10-CM

## 2015-11-28 DIAGNOSIS — F17210 Nicotine dependence, cigarettes, uncomplicated: Secondary | ICD-10-CM | POA: Diagnosis present

## 2015-11-28 DIAGNOSIS — Z8249 Family history of ischemic heart disease and other diseases of the circulatory system: Secondary | ICD-10-CM

## 2015-11-28 DIAGNOSIS — K831 Obstruction of bile duct: Secondary | ICD-10-CM | POA: Diagnosis present

## 2015-11-28 DIAGNOSIS — F10939 Alcohol use, unspecified with withdrawal, unspecified: Secondary | ICD-10-CM

## 2015-11-28 LAB — COMPREHENSIVE METABOLIC PANEL
ALBUMIN: 4 g/dL (ref 3.5–5.0)
ALT: 31 U/L (ref 14–54)
ANION GAP: 9 (ref 5–15)
AST: 41 U/L (ref 15–41)
Alkaline Phosphatase: 87 U/L (ref 38–126)
BILIRUBIN TOTAL: 0.6 mg/dL (ref 0.3–1.2)
BUN: 19 mg/dL (ref 6–20)
CALCIUM: 8.8 mg/dL — AB (ref 8.9–10.3)
CHLORIDE: 104 mmol/L (ref 101–111)
CO2: 22 mmol/L (ref 22–32)
Creatinine, Ser: 0.63 mg/dL (ref 0.44–1.00)
GFR calc Af Amer: >60 mL/min (ref >60)
GFR calc non Af Amer: >60 mL/min (ref >60)
GLUCOSE: 102 mg/dL — AB (ref 65–99)
POTASSIUM: 3.4 mmol/L — AB (ref 3.5–5.1)
SODIUM: 135 mmol/L (ref 135–145)
TOTAL PROTEIN: 8.2 g/dL — AB (ref 6.5–8.1)

## 2015-11-28 LAB — CBC WITH DIFFERENTIAL/PLATELET
BASOS PCT: 1 %
Basophils Absolute: 0 10*3/uL (ref 0–0.1)
EOS ABS: 0.1 10*3/uL (ref 0–0.7)
EOS PCT: 2 %
HCT: 40.4 % (ref 35.0–47.0)
Hemoglobin: 13.7 g/dL (ref 12.0–16.0)
Lymphocytes Relative: 28 %
Lymphs Abs: 2.2 10*3/uL (ref 1.0–3.6)
MCH: 36 pg — ABNORMAL HIGH (ref 26.0–34.0)
MCHC: 33.8 g/dL (ref 32.0–36.0)
MCV: 106.5 fL — ABNORMAL HIGH (ref 80.0–100.0)
MONO ABS: 0.5 10*3/uL (ref 0.2–0.9)
MONOS PCT: 6 %
NEUTROS PCT: 63 %
Neutro Abs: 5.1 10*3/uL (ref 1.4–6.5)
PLATELETS: 227 10*3/uL (ref 150–440)
RBC: 3.79 MIL/uL — ABNORMAL LOW (ref 3.80–5.20)
RDW: 13 % (ref 11.5–14.5)
WBC: 7.9 10*3/uL (ref 3.6–11.0)

## 2015-11-28 LAB — LIPASE, BLOOD: LIPASE: 24 U/L (ref 11–51)

## 2015-11-28 LAB — ETHANOL: Alcohol, Ethyl (B): 14 mg/dL — ABNORMAL HIGH (ref <5)

## 2015-11-28 LAB — PROTIME-INR
INR: 1.05
PROTHROMBIN TIME: 13.9 s (ref 11.4–15.0)

## 2015-11-28 LAB — APTT: aPTT: 28 seconds (ref 24–36)

## 2015-11-28 LAB — MAGNESIUM: MAGNESIUM: 2 mg/dL (ref 1.7–2.4)

## 2015-11-28 MED ORDER — ADULT MULTIVITAMIN W/MINERALS CH
1.0000 | ORAL_TABLET | Freq: Every day | ORAL | Status: DC
  Administered 2015-11-29 – 2015-11-30 (×2): 1 via ORAL
  Filled 2015-11-28 (×3): qty 1

## 2015-11-28 MED ORDER — HYDROMORPHONE HCL 1 MG/ML IJ SOLN
1.0000 mg | Freq: Once | INTRAMUSCULAR | Status: AC
Start: 2015-11-28 — End: 2015-11-28
  Administered 2015-11-28: 1 mg via INTRAVENOUS

## 2015-11-28 MED ORDER — LORAZEPAM 1 MG PO TABS
1.0000 mg | ORAL_TABLET | Freq: Four times a day (QID) | ORAL | Status: DC | PRN
  Administered 2015-11-29 – 2015-11-30 (×3): 1 mg via ORAL
  Filled 2015-11-28 (×3): qty 1

## 2015-11-28 MED ORDER — SODIUM CHLORIDE 0.9 % IV BOLUS (SEPSIS)
1000.0000 mL | Freq: Once | INTRAVENOUS | Status: AC
  Administered 2015-11-28: 1000 mL via INTRAVENOUS

## 2015-11-28 MED ORDER — MORPHINE SULFATE (PF) 2 MG/ML IV SOLN: 2.0000 mg | INTRAVENOUS | Status: DC | PRN

## 2015-11-28 MED ORDER — LORAZEPAM 2 MG/ML IJ SOLN
2.0000 mg | INTRAMUSCULAR | Status: DC | PRN
  Administered 2015-11-30: 2 mg via INTRAVENOUS

## 2015-11-28 MED ORDER — VITAMIN B-1 100 MG PO TABS
100.0000 mg | ORAL_TABLET | Freq: Every day | ORAL | Status: DC
  Administered 2015-11-28 – 2015-11-30 (×3): 100 mg via ORAL
  Filled 2015-11-28 (×3): qty 1

## 2015-11-28 MED ORDER — LORAZEPAM 2 MG PO TABS: 0.0000 mg | ORAL_TABLET | Freq: Two times a day (BID) | ORAL | Status: DC

## 2015-11-28 MED ORDER — FOLIC ACID 1 MG PO TABS
1.0000 mg | ORAL_TABLET | Freq: Every day | ORAL | Status: DC
  Administered 2015-11-29 – 2015-11-30 (×2): 1 mg via ORAL
  Filled 2015-11-28: qty 1

## 2015-11-28 MED ORDER — THIAMINE HCL 100 MG/ML IJ SOLN
100.0000 mg | Freq: Every day | INTRAMUSCULAR | Status: DC
Start: 2015-11-28 — End: 2015-11-30

## 2015-11-28 MED ORDER — THIAMINE HCL 100 MG/ML IJ SOLN
Freq: Once | INTRAVENOUS | Status: AC
  Administered 2015-11-29: 04:00:00 via INTRAVENOUS
  Filled 2015-11-28: qty 1000

## 2015-11-28 MED ORDER — IOHEXOL 300 MG/ML  SOLN
75.0000 mL | Freq: Once | INTRAMUSCULAR | Status: AC | PRN
Start: 2015-11-28 — End: 2015-11-28
  Administered 2015-11-28: 75 mL via INTRAVENOUS

## 2015-11-28 MED ORDER — ONDANSETRON HCL 4 MG/2ML IJ SOLN
4.0000 mg | Freq: Four times a day (QID) | INTRAMUSCULAR | Status: DC | PRN
  Administered 2015-11-30: 08:00:00 4 mg via INTRAVENOUS
  Filled 2015-11-28: qty 2

## 2015-11-28 MED ORDER — LORAZEPAM 2 MG PO TABS
0.0000 mg | ORAL_TABLET | Freq: Four times a day (QID) | ORAL | Status: DC
  Administered 2015-11-29 – 2015-11-30 (×3): 2 mg via ORAL
  Filled 2015-11-28 (×3): qty 1

## 2015-11-28 MED ORDER — ONDANSETRON HCL 4 MG/2ML IJ SOLN
4.0000 mg | Freq: Once | INTRAMUSCULAR | Status: AC
  Administered 2015-11-28: 4 mg via INTRAVENOUS
  Filled 2015-11-28: qty 2

## 2015-11-28 MED ORDER — FOLIC ACID 1 MG PO TABS
1.0000 mg | ORAL_TABLET | Freq: Every day | ORAL | Status: DC
  Administered 2015-11-28: 1 mg via ORAL
  Filled 2015-11-28 (×3): qty 1

## 2015-11-28 MED ORDER — LORAZEPAM 2 MG/ML IJ SOLN
1.0000 mg | Freq: Four times a day (QID) | INTRAMUSCULAR | Status: DC | PRN
  Administered 2015-11-29: 1 mg via INTRAVENOUS
  Filled 2015-11-28 (×2): qty 1

## 2015-11-28 MED ORDER — ADULT MULTIVITAMIN W/MINERALS CH
1.0000 | ORAL_TABLET | Freq: Every day | ORAL | Status: DC
  Administered 2015-11-28: 1 via ORAL
  Filled 2015-11-28 (×2): qty 1

## 2015-11-28 MED ORDER — IOHEXOL 240 MG/ML SOLN
25.0000 mL | INTRAMUSCULAR | Status: AC
  Administered 2015-11-28: 50 mL via ORAL

## 2015-11-28 MED ORDER — VITAMIN B-1 100 MG PO TABS: 100.0000 mg | ORAL_TABLET | Freq: Every day | ORAL | Status: DC

## 2015-11-28 MED ORDER — HYDROMORPHONE HCL 1 MG/ML IJ SOLN
INTRAMUSCULAR | Status: AC
  Filled 2015-11-28: qty 1

## 2015-11-28 MED ORDER — HYDROMORPHONE HCL 1 MG/ML IJ SOLN
1.0000 mg | Freq: Once | INTRAMUSCULAR | Status: AC
  Administered 2015-11-28: 1 mg via INTRAVENOUS
  Filled 2015-11-28: qty 1

## 2015-11-28 MED ORDER — LORAZEPAM 2 MG PO TABS
2.0000 mg | ORAL_TABLET | Freq: Once | ORAL | Status: AC
  Administered 2015-11-28: 2 mg via ORAL
  Filled 2015-11-28: qty 1

## 2015-11-28 NOTE — ED Notes (Signed)
Pt reports she needs med clearance for RTS, pt reports she is a daily drinker and occasionally does crack. Pt reports she has had 2 beers today about 4 hrs ago.

## 2015-11-28 NOTE — ED Notes (Signed)

## 2015-11-28 NOTE — ED Provider Notes (Signed)
[facility] Emergency Department Provider Note  ____________________________________________  Time seen: Approximately 6:28 PM  I have reviewed the triage vital signs and the nursing notes.   HISTORY  Chief Complaint detox    HPI [name] is a 50 y.o. female history of alcohol dependence, hep C, COPD, chronic pancreatitis, chronic pain who presents requesting detox but is also concerned that she is having abdominal pain. She states she develops pain 1-2 times per week and it is really bothering her right now. She states she has had decreased by mouth intake and has had some vomiting today. Her last drink of alcohol was this morning and she is feeling shaky. She does have history of withdrawal seizures. She does report weight loss but unsure in what time frame and how much. She also reports some bright red blood per rectum today with some clotting. It was mixed with stool.  From her report it sounds like she was in the [facility] ICU about 3 years ago for her pancreatitis. She should shows any scars in her epigastric and right upper quadrant area. She states that she had a G-tube and "another tube".  Past Medical History  Diagnosis Date  . Asthma   . History of drug use ([place])   . Alcohol abuse   . Hypothyroidism   . Depression   . Hepatitis C   . COPD (chronic obstructive pulmonary disease) ([place])   . Cancer ([place])     "carcinoma of throat" with s/p thyroidectomy  . Seizures ([place])   . Low back pain   . Chronic pain syndrome   . Chronic pancreatitis ([place])   . History of alcohol abuse     Patient Active Problem List   Diagnosis Date Noted  . Substance abuse 07/23/2015  . Chronic pain syndrome 07/20/2015  . COPD (chronic obstructive pulmonary disease) ([place]) 07/20/2015  . Hepatitis C 07/20/2015  . Low back pain 07/20/2015  . Alcohol use disorder, severe, dependence ([place]) 03/22/2015  . Alcohol-induced depressive disorder with moderate or severe use disorder  ([place]) 03/22/2015  . Pancreatitis 03/21/2015  . Chronic pancreatitis ([place]) 03/18/2015  . Pancreatic pseudocyst 03/18/2015  . Alcohol abuse 03/18/2015  . Hypothyroidism 03/18/2015  . Depression 03/18/2015  . Abdominal pain, epigastric 03/18/2015    Past Surgical History  Procedure Laterality Date  . Total thyroidectomy    . Cesarean section    . Gastrostomy tube placement  2011    Current Outpatient Rx  Name  Route  Sig  Dispense  Refill  . albuterol (PROVENTIL HFA;VENTOLIN HFA) 108 (90 BASE) MCG/ACT inhaler   Inhalation   Inhale 2 puffs into the lungs every 6 (six) hours as needed for wheezing or shortness of breath.         . clonazePAM (KLONOPIN) 1 MG tablet   Oral   Take 1 mg by mouth 3 (three) times daily as needed for anxiety.          . DULoxetine (CYMBALTA) 60 MG capsule   Oral   Take 1 capsule (60 mg total) by mouth daily.   90 capsule   3   . levothyroxine (SYNTHROID, LEVOTHROID) 200 MCG tablet   Oral   Take 1 tablet (200 mcg total) by mouth daily. Pt takes with 50 mcg tablet.   30 tablet   1   . levothyroxine (SYNTHROID, LEVOTHROID) 50 MCG tablet   Oral   Take 1 tablet (50 mcg total) by mouth daily. Pt takes with 200 mcg  tablet.   30 tablet   1     Allergies Penicillins  Family History  Problem Relation Age of Onset  . Diabetes Mellitus II Maternal Aunt   . Pancreatic cancer Paternal Aunt   . Diabetes Mellitus II Maternal Grandfather   . CAD Maternal Grandfather   . Diabetes Mellitus II Mother   . Liver cancer Mother   . Arthritis Mother   . Cancer Mother     lung  . Cancer Father   . Thyroid disease Son     Social History Social History  Substance Use Topics  . Smoking status: Current Every Day Smoker -- 0.50 packs/day    Types: Cigarettes  . Smokeless tobacco: Never Used  . Alcohol Use: 1.8 oz/week    3 Glasses of wine, 0 Standard drinks or equivalent per week     Comment: History of alcohol use daily    Review of  Systems Constitutional: No fever Cardiovascular: Denies chest pain. Respiratory: Denies shortness of breath. Gastrointestinal:See history of present illness   Skin: Negative for rash. 10-point ROS otherwise negative.  ____________________________________________   PHYSICAL EXAM:  VITAL SIGNS: ED Triage Vitals  Enc Vitals Group     BP 11/28/15 1656 174/104 mmHg     Pulse Rate 11/28/15 1656 95     Resp 11/28/15 1656 20     Temp 11/28/15 1656 98.5 F (36.9 C)     Temp Source 11/28/15 1656 Oral     SpO2 11/28/15 1656 97 %     Weight 11/28/15 1656 114 lb (51.71 kg)     Height 11/28/15 1656 5\' 1"  (1.549 m)     Head Cir --      Peak Flow --      Pain Score 11/28/15 1741 6     Pain Loc --      Pain Edu? --      Excl. in [place]? --     Constitutional: Alert and oriented. Well appearing and in no acute distress. Eyes: Conjunctivae are normal. PERRL. EOMI. Head: Atraumatic. Nose: No congestion/rhinnorhea. Mouth/Throat: Mucous membranes are slightly dry.  Oropharynx non-erythematous. Neck: No stridor.   Cardiovascular: Normal rate, regular rhythm. Grossly normal heart sounds.  Good peripheral circulation. Respiratory: Normal respiratory effort.  No retractions. Lungs CTAB. Gastrointestinal: Soft, moderate tenderness epigastric and left upper quadrant. Reducible ventral hernia.  Musculoskeletal: No lower extremity tenderness nor edema.  No joint effusions. Neurologic:  Normal speech and language. No gross focal neurologic deficits are appreciated. No gait instability. Skin:  Skin is warm, dry and intact. No rash noted but her face is somewhat flushed. Psychiatric: Mood and affect are normal. Speech and behavior are normal.  ____________________________________________   LABS (all labs ordered are listed, but only abnormal results are displayed)  Labs Reviewed  COMPREHENSIVE METABOLIC PANEL - Abnormal; Notable for the following:    Potassium 3.4 (*)    Glucose, Bld 102 (*)     Calcium 8.8 (*)    Total Protein 8.2 (*)    All other components within normal limits  ETHANOL - Abnormal; Notable for the following:    Alcohol, Ethyl (B) 14 (*)    All other components within normal limits  CBC WITH DIFFERENTIAL/PLATELET - Abnormal; Notable for the following:    RBC 3.79 (*)    MCV 106.5 (*)    MCH 36.0 (*)    All other components within normal limits  LIPASE, BLOOD  PROTIME-INR  APTT  URINE DRUG SCREEN, QUALITATIVE ([facility] ONLY)  ____________________________________________  RADIOLOGY  CT a/p-IMPRESSION:  1. Persistent and increasing mild to moderate intrahepatic biliary  ductal dilatation and common bile duct dilatation which abruptly  tapers in the region of the head of the pancreas. Again, a distal  common bile duct stricture is suspected. Correlation with MRCP or  ERCP could provide additional diagnostic information if clinically  appropriate.  2. There appears to be either high-grade stenosis or occlusion of  both the distal splenic vein and distal superior mesenteric veins  immediately before the splenoportal confluence. Associated with this  there are large collateral vessels, most notable for large gastric  varices, which reconstitute the portal vein.  3. Additional incidental findings, as above.  Electronically Signed  By: Vinnie Langton M.D.  On: 11/28/2015 21:02   ____________________________________________  ____________________________________________   INITIAL IMPRESSION / ASSESSMENT AND PLAN / ED COURSE  Pertinent labs & imaging results that were available during my care of the patient were reviewed by me and considered in my medical decision making (see chart for details).  Will admit for further evaluation of stricture, pain control, & detox monitoring given h/o withdrawal seizures. 9:30p Case d/w Dr. Reva Bores & pt updated. ____________________________________________   FINAL CLINICAL IMPRESSION(S) / ED DIAGNOSES  Intrahepatic  biliary ductal dilatation Complicated ETOH withdrawal.  Ponciano Ort, MD 11/28/15 2130

## 2015-11-28 NOTE — H&P (Signed)
[place] [facility] [place] NAME: [name]    MR#:  [id number]  DATE OF BIRTH:  07/16/1966  DATE OF ADMISSION:  11/28/2015  PRIMARY CARE PHYSICIAN: Kathrine Haddock, NP   REQUESTING/REFERRING PHYSICIAN: McLoriene  CHIEF COMPLAINT:   Chief Complaint  Patient presents with  . detox     HISTORY OF PRESENT ILLNESS: [name]  is a 50 y.o. female with a known history of asthma, alcohol abuse, hypothyroidism, depression, hepatitis C, COPD, hepatocellular carcinoma, seizure disorder, alcohol withdrawal seizure, chronic pancreatitis- came to emergency room as she wanted to detox herself from alcohol. Last drink was today morning and she already appears very anxious, and says that she had seizures ever she try not to drink alcohol. She also complained of abdominal pain and some nausea. CT scan of the abdomen was done which showed some dilation of the intrahepatic duct with an abrupt stopping signs.  PAST MEDICAL HISTORY:   Past Medical History  Diagnosis Date  . Asthma   . History of drug use ([place])   . Alcohol abuse   . Hypothyroidism   . Depression   . Hepatitis C   . COPD (chronic obstructive pulmonary disease) ([place])   . Cancer ([place])     "carcinoma of throat" with s/p thyroidectomy  . Seizures ([place])   . Low back pain   . Chronic pain syndrome   . Chronic pancreatitis ([place])   . History of alcohol abuse     PAST SURGICAL HISTORY: Past Surgical History  Procedure Laterality Date  . Total thyroidectomy    . Cesarean section    . Gastrostomy tube placement  2011    SOCIAL HISTORY:  Social History  Substance Use Topics  . Smoking status: Current Every Day Smoker -- 0.50 packs/day    Types: Cigarettes  . Smokeless tobacco: Never Used  . Alcohol Use: 1.8 oz/week    3 Glasses of wine, 0 Standard drinks or equivalent per week     Comment: History of alcohol use daily    FAMILY HISTORY:  Family History  Problem Relation Age of Onset   . Diabetes Mellitus II Maternal Aunt   . Pancreatic cancer Paternal Aunt   . Diabetes Mellitus II Maternal Grandfather   . CAD Maternal Grandfather   . Diabetes Mellitus II Mother   . Liver cancer Mother   . Arthritis Mother   . Cancer Mother     lung  . Cancer Father   . Thyroid disease Son     DRUG ALLERGIES:  Allergies  Allergen Reactions  . Penicillins Rash and Other (See Comments)    Has patient had a PCN reaction causing immediate rash, facial/tongue/throat swelling, SOB or lightheadedness with hypotension: No Has patient had a PCN reaction causing severe rash involving mucus membranes or skin necrosis: No Has patient had a PCN reaction that required hospitalization No Has patient had a PCN reaction occurring within the last 10 years: No If all of the above answers are "NO", then may proceed with Cephalosporin use.    REVIEW OF SYSTEMS:   CONSTITUTIONAL: No fever, fatigue or weakness.  EYES: No blurred or double vision.  EARS, NOSE, AND THROAT: No tinnitus or ear pain.  RESPIRATORY: No cough, shortness of breath, wheezing or hemoptysis.  CARDIOVASCULAR: No chest pain, orthopnea, edema.  GASTROINTESTINAL: No nausea, vomiting, diarrhea , positive for abdominal pain.  GENITOURINARY: No dysuria, hematuria.  ENDOCRINE: No polyuria, nocturia,  HEMATOLOGY: No anemia, easy  bruising or bleeding SKIN: No rash or lesion. MUSCULOSKELETAL: No joint pain or arthritis.   NEUROLOGIC: No tingling, numbness, weakness.  PSYCHIATRY: No anxiety or depression. C/o anxiety.  MEDICATIONS AT HOME:  Prior to Admission medications   Medication Sig Start Date End Date Taking? Authorizing Provider  albuterol (PROVENTIL HFA;VENTOLIN HFA) 108 (90 BASE) MCG/ACT inhaler Inhale 2 puffs into the lungs every 6 (six) hours as needed for wheezing or shortness of breath.   Yes Historical Provider, MD  clonazePAM (KLONOPIN) 1 MG tablet Take 1 mg by mouth 3 (three) times daily as needed for anxiety.     Yes Historical Provider, MD  diphenhydrAMINE (BENADRYL) 25 MG tablet Take 25 mg by mouth at bedtime as needed for allergies.   Yes Historical Provider, MD  DULoxetine (CYMBALTA) 60 MG capsule Take 1 capsule (60 mg total) by mouth daily. 07/23/15  Yes Kathrine Haddock, NP  levothyroxine (SYNTHROID, LEVOTHROID) 200 MCG tablet Take 1 tablet (200 mcg total) by mouth daily. Pt takes with 50 mcg tablet. 07/23/15  Yes Kathrine Haddock, NP  levothyroxine (SYNTHROID, LEVOTHROID) 50 MCG tablet Take 1 tablet (50 mcg total) by mouth daily. Pt takes with 200 mcg tablet. 07/23/15  Yes Kathrine Haddock, NP  Misc Natural Products (HOT FLASHEX PO) Take 1 tablet by mouth at bedtime.   Yes Historical Provider, MD      PHYSICAL EXAMINATION:   VITAL SIGNS: Blood pressure 151/125, pulse 102, temperature 98.5 F (36.9 C), temperature source Oral, resp. rate 20, height 5\' 1"  (1.549 m), weight 51.71 kg (114 lb), SpO2 98 %.  GENERAL:  50 y.o.-year-old patient lying in the bed with no acute distress.  EYES: Pupils equal, round, reactive to light and accommodation. No scleral icterus. Extraocular muscles intact.  HEENT: Head atraumatic, normocephalic. Oropharynx and nasopharynx clear.  NECK:  Supple, no jugular venous distention. No thyroid enlargement, no tenderness.  LUNGS: Normal breath sounds bilaterally, no wheezing, rales,rhonchi or crepitation. No use of accessory muscles of respiration.  CARDIOVASCULAR: S1, S2 normal. No murmurs, rubs, or gallops.  ABDOMEN: Soft, mild tender, distended. Bowel sounds present. No organomegaly or mass.  EXTREMITIES: No pedal edema, cyanosis, or clubbing.  NEUROLOGIC: Cranial nerves II through XII are intact. Muscle strength 5/5 in all extremities. Sensation intact. Gait not checked. tremers present PSYCHIATRIC: The patient is alert and oriented x 3. Very anxious. SKIN: No obvious rash, lesion, or ulcer.   LABORATORY PANEL:   CBC  Recent Labs Lab 11/28/15 1916  WBC 7.9  HGB 13.7  HCT  40.4  PLT 227  MCV 106.5*  MCH 36.0*  MCHC 33.8  RDW 13.0  LYMPHSABS 2.2  MONOABS 0.5  EOSABS 0.1  BASOSABS 0.0   ------------------------------------------------------------------------------------------------------------------  Chemistries   Recent Labs Lab 11/28/15 1916  NA 135  K 3.4*  CL 104  CO2 22  GLUCOSE 102*  BUN 19  CREATININE 0.63  CALCIUM 8.8*  AST 41  ALT 31  ALKPHOS 87  BILITOT 0.6   ------------------------------------------------------------------------------------------------------------------ estimated creatinine clearance is 62.8 mL/min (by C-G formula based on Cr of 0.63). ------------------------------------------------------------------------------------------------------------------ No results for input(s): TSH, T4TOTAL, T3FREE, THYROIDAB in the last 72 hours.  Invalid input(s): FREET3   Coagulation profile  Recent Labs Lab 11/28/15 1917  INR 1.05   ------------------------------------------------------------------------------------------------------------------- No results for input(s): DDIMER in the last 72 hours. -------------------------------------------------------------------------------------------------------------------  Cardiac Enzymes No results for input(s): CKMB, TROPONINI, MYOGLOBIN in the last 168 hours.  Invalid input(s): CK ------------------------------------------------------------------------------------------------------------------ Invalid input(s): POCBNP  ---------------------------------------------------------------------------------------------------------------  Urinalysis  Component Value Date/Time   COLORURINE STRAW* 07/06/2015 0555   COLORURINE Yellow 12/27/2013 0847   APPEARANCEUR HAZY* 07/06/2015 0555   APPEARANCEUR Clear 12/27/2013 0847   LABSPEC 1.003* 07/06/2015 0555   LABSPEC 1.006 12/27/2013 0847   PHURINE 6.0 07/06/2015 0555   PHURINE 6.0 12/27/2013 0847   GLUCOSEU NEGATIVE  07/06/2015 0555   GLUCOSEU Negative 12/27/2013 0847   HGBUR NEGATIVE 07/06/2015 0555   HGBUR 1+ 12/27/2013 0847   BILIRUBINUR NEGATIVE 07/06/2015 0555   BILIRUBINUR Negative 12/27/2013 [place] 07/06/2015 0555   KETONESUR Negative 12/27/2013 0847   PROTEINUR NEGATIVE 07/06/2015 0555   PROTEINUR Negative 12/27/2013 0847   NITRITE NEGATIVE 07/06/2015 0555   NITRITE Negative 12/27/2013 0847   LEUKOCYTESUR NEGATIVE 07/06/2015 0555   LEUKOCYTESUR Negative 12/27/2013 0847     RADIOLOGY: Ct Abdomen Pelvis W Contrast  11/28/2015  CLINICAL DATA:  50 year old female with history of epigastric pain for the past 2 days. Bright red blood per rectum today. History of pancreatitis. EXAM: CT ABDOMEN AND PELVIS WITH CONTRAST TECHNIQUE: Multidetector CT imaging of the abdomen and pelvis was performed using the standard protocol following bolus administration of intravenous contrast. CONTRAST:  54mL OMNIPAQUE IOHEXOL 300 MG/ML  SOLN COMPARISON:  CT the abdomen and pelvis 03/18/2015. FINDINGS: Lower chest:  Unremarkable. Hepatobiliary: No cystic or solid hepatic lesions. Mild to moderate intrahepatic biliary ductal dilatation. Common bile duct measures up to 11 mm in the porta hepatis, but tapers abruptly in the region of the pancreatic head. Gallbladder is unremarkable in appearance. Pancreas: No pancreatic mass. No pancreatic ductal dilatation. No pancreatic or peripancreatic fluid or inflammatory changes. Spleen: Unremarkable. Adrenals/Urinary Tract: Bilateral adrenal glands and bilateral kidneys are normal in appearance. No hydroureteronephrosis. Urinary bladder is normal in appearance. Stomach/Bowel: Stomach is moderately distended, but otherwise unremarkable in appearance. Previously demonstrated pancreatic pseudocyst along the lesser curvature of the stomach has resolved. No pathologic dilatation of small bowel or colon. Normal appendix. Vascular/Lymphatic: Atherosclerosis throughout the  abdominal and pelvic vasculature, without evidence of aneurysm or dissection. 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 The proximal portal vein is dilated measuring up to 15 mm in diameter, and appears to be fed by numerous collateral vessels, most notable for some large gastric varices. No lymphadenopathy noted in the abdomen or pelvis. Reproductive: Uterus and ovaries are unremarkable in appearance. Dilated left gonadal veins. Other: No significant volume of ascites.  No pneumoperitoneum. Musculoskeletal: There are no aggressive appearing lytic or blastic lesions noted in the visualized portions of the skeleton. IMPRESSION: 1. Persistent and increasing mild to moderate intrahepatic biliary ductal dilatation and common bile duct dilatation which abruptly tapers in the region of the head of the pancreas. Again, a distal common bile duct stricture is suspected. Correlation with MRCP or ERCP could provide additional diagnostic information if clinically appropriate. 2. There appears to be either high-grade stenosis or occlusion of both the distal splenic vein and distal superior mesenteric veins immediately before the splenoportal confluence. Associated with this there are large collateral vessels, most notable for large gastric varices, which reconstitute the portal vein. 3. Additional incidental findings, as above. Electronically Signed   By: Vinnie Langton M.D.   On: 11/28/2015 21:02    EKG: Orders placed or performed during the hospital encounter of 03/18/15  . ED EKG  . ED EKG  . EKG 12-Lead  . EKG 12-Lead  . EKG 12-Lead  .  EKG 12-Lead  . EKG    IMPRESSION AND PLAN:  * Alcohol withdrawal   Will keep on CIWA protocol, give IV medications when needed.   Keep on IV fluids with vitamins.    * Abdominal pain and nausea   Has history of hepatitis C   And chronic pancreatitis   CT  scan of the abdomen shows intrahepatic ductal dilation.     Give IV Zofran for nausea and morphine for pain.    GI consult for further management.  * Anxiety   Continue clonazepam.  * Hypothyroidism   Continue levothyroxine.  * Smoking   Counseled to quit for 4 minutes, offered nicotine patch.   All the records are reviewed and case discussed with ED provider. Management plans discussed with the patient, family and they are in agreement.  CODE STATUS: Full code Code Status History    Date Active Date Inactive Code Status Order ID Comments User Context   03/21/2015  7:17 PM 03/26/2015  1:41 PM Full Code [id number]  Aldean Jewett, MD Inpatient   03/18/2015  8:26 PM 03/21/2015  2:16 PM Full Code [id number]  Lance Coon, MD Inpatient       TOTAL TIME TAKING CARE OF THIS PATIENT: 50 minutes.    Vaughan Basta M.D on 11/28/2015   Between 7am to 6pm - Pager - [phone]  After 6pm go to [email or web address] - password [email or web address] [place] [facility]  Office  [phone]  CC: Primary care physician; Kathrine Haddock, NP   Note: This dictation was prepared with Dragon dictation along with smaller phrase technology. Any transcriptional errors that result from this process are unintentional.

## 2015-11-28 NOTE — ED Notes (Signed)
Admitting doctor with patient.

## 2015-11-28 NOTE — ED Notes (Signed)
Pt transported to CT via stretcher.  

## 2015-11-28 NOTE — ED Notes (Signed)
Pt requesting Ginger Ale and something to eat. McLauren, MD made aware; pt given meal tray and drink.

## 2015-11-29 ENCOUNTER — Inpatient Hospital Stay: Payer: MEDICAID

## 2015-11-29 DIAGNOSIS — K831 Obstruction of bile duct: Secondary | ICD-10-CM | POA: Diagnosis present

## 2015-11-29 LAB — CBC
HEMATOCRIT: 35.6 % (ref 35.0–47.0)
Hemoglobin: 12.1 g/dL (ref 12.0–16.0)
MCH: 36.2 pg — ABNORMAL HIGH (ref 26.0–34.0)
MCHC: 34.1 g/dL (ref 32.0–36.0)
MCV: 106.1 fL — AB (ref 80.0–100.0)
Platelets: 160 10*3/uL (ref 150–440)
RBC: 3.35 MIL/uL — ABNORMAL LOW (ref 3.80–5.20)
RDW: 13.2 % (ref 11.5–14.5)
WBC: 6.2 10*3/uL (ref 3.6–11.0)

## 2015-11-29 LAB — URINE DRUG SCREEN, QUALITATIVE (ARMC ONLY)
AMPHETAMINES, UR SCREEN: NOT DETECTED
Barbiturates, Ur Screen: NOT DETECTED
Benzodiazepine, Ur Scrn: POSITIVE — AB
COCAINE METABOLITE, UR ~~LOC~~: POSITIVE — AB
Cannabinoid 50 Ng, Ur ~~LOC~~: POSITIVE — AB
MDMA (ECSTASY) UR SCREEN: NOT DETECTED
Methadone Scn, Ur: NOT DETECTED
OPIATE, UR SCREEN: POSITIVE — AB
PHENCYCLIDINE (PCP) UR S: NOT DETECTED
Tricyclic, Ur Screen: NOT DETECTED

## 2015-11-29 LAB — BASIC METABOLIC PANEL
Anion gap: 5 (ref 5–15)
BUN: 16 mg/dL (ref 6–20)
CHLORIDE: 105 mmol/L (ref 101–111)
CO2: 25 mmol/L (ref 22–32)
Calcium: 8.5 mg/dL — ABNORMAL LOW (ref 8.9–10.3)
Creatinine, Ser: 0.56 mg/dL (ref 0.44–1.00)
GFR calc Af Amer: >60 mL/min (ref >60)
GFR calc non Af Amer: >60 mL/min (ref >60)
GLUCOSE: 98 mg/dL (ref 65–99)
POTASSIUM: 3.3 mmol/L — AB (ref 3.5–5.1)
Sodium: 135 mmol/L (ref 135–145)

## 2015-11-29 MED ORDER — MORPHINE SULFATE 15 MG PO TABS
7.5000 mg | ORAL_TABLET | Freq: Four times a day (QID) | ORAL | Status: DC | PRN
  Administered 2015-11-29 – 2015-11-30 (×3): 7.5 mg via ORAL
  Filled 2015-11-29 (×3): qty 1

## 2015-11-29 MED ORDER — DULOXETINE HCL 60 MG PO CPEP
60.0000 mg | ORAL_CAPSULE | Freq: Every day | ORAL | Status: DC
  Administered 2015-11-29 – 2015-11-30 (×2): 60 mg via ORAL
  Filled 2015-11-29 (×2): qty 1

## 2015-11-29 MED ORDER — NICOTINE 10 MG IN INHA
1.0000 | RESPIRATORY_TRACT | Status: DC | PRN
Start: 2015-11-29 — End: 2015-11-29

## 2015-11-29 MED ORDER — DIPHENHYDRAMINE HCL 25 MG PO TABS
25.0000 mg | ORAL_TABLET | Freq: Every evening | ORAL | Status: DC | PRN
  Filled 2015-11-29: qty 1

## 2015-11-29 MED ORDER — HYDROMORPHONE HCL 1 MG/ML IJ SOLN
INTRAMUSCULAR | Status: AC
  Filled 2015-11-29: qty 1

## 2015-11-29 MED ORDER — ALBUTEROL SULFATE (2.5 MG/3ML) 0.083% IN NEBU: 2.5000 mg | INHALATION_SOLUTION | Freq: Four times a day (QID) | RESPIRATORY_TRACT | Status: DC | PRN

## 2015-11-29 MED ORDER — CLONAZEPAM 1 MG PO TABS
1.0000 mg | ORAL_TABLET | Freq: Three times a day (TID) | ORAL | Status: DC | PRN
  Administered 2015-11-30: 1 mg via ORAL
  Filled 2015-11-29: qty 1

## 2015-11-29 MED ORDER — ALBUTEROL SULFATE HFA 108 (90 BASE) MCG/ACT IN AERS: 2.0000 | INHALATION_SPRAY | Freq: Four times a day (QID) | RESPIRATORY_TRACT | Status: DC | PRN

## 2015-11-29 MED ORDER — HYDROMORPHONE HCL 1 MG/ML IJ SOLN
1.0000 mg | Freq: Once | INTRAMUSCULAR | Status: AC
  Administered 2015-11-29: 1 mg via INTRAVENOUS

## 2015-11-29 MED ORDER — OXYCODONE HCL 5 MG PO TABS
5.0000 mg | ORAL_TABLET | Freq: Four times a day (QID) | ORAL | Status: DC | PRN
  Administered 2015-11-29 – 2015-11-30 (×3): 5 mg via ORAL
  Filled 2015-11-29 (×3): qty 1

## 2015-11-29 MED ORDER — LEVOTHYROXINE SODIUM 100 MCG PO TABS
200.0000 ug | ORAL_TABLET | Freq: Every day | ORAL | Status: DC
  Administered 2015-11-29 – 2015-11-30 (×2): 200 ug via ORAL
  Filled 2015-11-29 (×2): qty 2

## 2015-11-29 MED ORDER — HYDROMORPHONE HCL 1 MG/ML IJ SOLN
0.5000 mg | INTRAMUSCULAR | Status: DC | PRN
  Administered 2015-11-29 (×5): 0.5 mg via INTRAVENOUS
  Filled 2015-11-29 (×5): qty 1

## 2015-11-29 MED ORDER — POTASSIUM CHLORIDE 20 MEQ PO PACK
40.0000 meq | PACK | Freq: Once | ORAL | Status: AC
  Administered 2015-11-29: 40 meq via ORAL
  Filled 2015-11-29: qty 2

## 2015-11-29 MED ORDER — HEPARIN SODIUM (PORCINE) 5000 UNIT/ML IJ SOLN
5000.0000 [IU] | Freq: Three times a day (TID) | INTRAMUSCULAR | Status: DC
  Administered 2015-11-29 – 2015-11-30 (×4): 5000 [IU] via SUBCUTANEOUS
  Filled 2015-11-29 (×4): qty 1

## 2015-11-29 MED ORDER — NICOTINE 21 MG/24HR TD PT24
21.0000 mg | MEDICATED_PATCH | Freq: Every day | TRANSDERMAL | Status: DC
  Administered 2015-11-29 – 2015-11-30 (×2): 21 mg via TRANSDERMAL
  Filled 2015-11-29 (×2): qty 1

## 2015-11-29 MED ORDER — FENTANYL 12 MCG/HR TD PT72
12.5000 ug | MEDICATED_PATCH | TRANSDERMAL | Status: DC
  Administered 2015-11-29: 12.5 ug via TRANSDERMAL
  Filled 2015-11-29: qty 1

## 2015-11-29 NOTE — Progress Notes (Signed)
Care of patient assumed from 1900 - 2300. At beginning of shift patient was very restless and complaining of severe abdominal discomfort. Agitated and restless in room. Oxycodone 5 mg po given for pain. See physical assessment documented. Also changed bed to low bed and padding placed on floor next to bed. Multiple snacks brought to room and multiple requests satisfied. Patient eventually settled down and fell asleep. Tele SR 90's. No acute distress noted.

## 2015-11-29 NOTE — Progress Notes (Signed)
[facility] [place] NAME: [name]    MR#:  [id number]  DATE OF BIRTH:  06-09-1966  SUBJECTIVE:  CHIEF COMPLAINT:  Patient is in tears, wants to get inpatient detoxification and reporting diffuse abdominal pain. Denies any nausea or vomiting.  REVIEW OF SYSTEMS:  CONSTITUTIONAL: No fever, fatigue or weakness.  EYES: No blurred or double vision.  EARS, NOSE, AND THROAT: No tinnitus or ear pain.  RESPIRATORY: No cough, shortness of breath, wheezing or hemoptysis.  CARDIOVASCULAR: No chest pain, orthopnea, edema.  GASTROINTESTINAL: No nausea, vomiting, diarrhea ,reporting generalized abdominal pain.  GENITOURINARY: No dysuria, hematuria.  ENDOCRINE: No polyuria, nocturia,  HEMATOLOGY: No anemia, easy bruising or bleeding SKIN: No rash or lesion. MUSCULOSKELETAL: No joint pain or arthritis.   NEUROLOGIC: No tingling, numbness, weakness.  PSYCHIATRY: No anxiety or depression.   DRUG ALLERGIES:   Allergies  Allergen Reactions  . Penicillins Rash and Other (See Comments)    Has patient had a PCN reaction causing immediate rash, facial/tongue/throat swelling, SOB or lightheadedness with hypotension: No Has patient had a PCN reaction causing severe rash involving mucus membranes or skin necrosis: No Has patient had a PCN reaction that required hospitalization No Has patient had a PCN reaction occurring within the last 10 years: No If all of the above answers are "NO", then may proceed with Cephalosporin use.    VITALS:  Blood pressure 140/88, pulse 74, temperature 97.7 F (36.5 C), temperature source Oral, resp. rate 20, height 5\' 1"  (1.549 m), weight 52.436 kg (115 lb 9.6 oz), SpO2 98 %.  PHYSICAL EXAMINATION:  GENERAL:  50 y.o.-year-old patient lying in the bed with no acute distress.  EYES: Pupils equal, round, reactive to light and accommodation. No scleral icterus. Extraocular muscles intact.  HEENT: Head atraumatic,  normocephalic. Oropharynx and nasopharynx clear.  NECK:  Supple, no jugular venous distention. No thyroid enlargement, no tenderness.  LUNGS: Normal breath sounds bilaterally, no wheezing, rales,rhonchi or crepitation. No use of accessory muscles of respiration.  CARDIOVASCULAR: S1, S2 normal. No murmurs, rubs, or gallops.  ABDOMEN: Soft, diffusely tender, distended. No rebound tenderness Bowel sounds present. No organomegaly or mass.  EXTREMITIES: No pedal edema, cyanosis, or clubbing.  NEUROLOGIC: Cranial nerves II through XII are intact. Muscle strength 5/5 in all extremities. Sensation intact. Gait not checked.  PSYCHIATRIC: The patient is alert and oriented x 3. Very anxious SKIN: No obvious rash, lesion, or ulcer.    LABORATORY PANEL:   CBC  Recent Labs Lab 11/29/15 0500  WBC 6.2  HGB 12.1  HCT 35.6  PLT 160   ------------------------------------------------------------------------------------------------------------------  Chemistries   Recent Labs Lab 11/28/15 1916 11/28/15 1917 11/29/15 0500  NA 135  --  135  K 3.4*  --  3.3*  CL 104  --  105  CO2 22  --  25  GLUCOSE 102*  --  98  BUN 19  --  16  CREATININE 0.63  --  0.56  CALCIUM 8.8*  --  8.5*  MG  --  2.0  --   AST 41  --   --   ALT 31  --   --   ALKPHOS 87  --   --   BILITOT 0.6  --   --    ------------------------------------------------------------------------------------------------------------------  Cardiac Enzymes No results for input(s): TROPONINI in the last 168 hours. ------------------------------------------------------------------------------------------------------------------  RADIOLOGY:  Ct Abdomen Pelvis W Contrast  11/28/2015  CLINICAL DATA:  50 year old female with  history of epigastric pain for the past 2 days. Bright red blood per rectum today. History of pancreatitis. EXAM: CT ABDOMEN AND PELVIS WITH CONTRAST TECHNIQUE: Multidetector CT imaging of the abdomen and pelvis was  performed using the standard protocol following bolus administration of intravenous contrast. CONTRAST:  15mL OMNIPAQUE IOHEXOL 300 MG/ML  SOLN COMPARISON:  CT the abdomen and pelvis 03/18/2015. FINDINGS: Lower chest:  Unremarkable. Hepatobiliary: No cystic or solid hepatic lesions. Mild to moderate intrahepatic biliary ductal dilatation. Common bile duct measures up to 11 mm in the porta hepatis, but tapers abruptly in the region of the pancreatic head. Gallbladder is unremarkable in appearance. Pancreas: No pancreatic mass. No pancreatic ductal dilatation. No pancreatic or peripancreatic fluid or inflammatory changes. Spleen: Unremarkable. Adrenals/Urinary Tract: Bilateral adrenal glands and bilateral kidneys are normal in appearance. No hydroureteronephrosis. Urinary bladder is normal in appearance. Stomach/Bowel: Stomach is moderately distended, but otherwise unremarkable in appearance. Previously demonstrated pancreatic pseudocyst along the lesser curvature of the stomach has resolved. No pathologic dilatation of small bowel or colon. Normal appendix. Vascular/Lymphatic: Atherosclerosis throughout the abdominal and pelvic vasculature, without evidence of aneurysm or dissection. 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 The proximal portal vein is dilated measuring up to 15 mm in diameter, and appears to be fed by numerous collateral vessels, most notable for some large gastric varices. No lymphadenopathy noted in the abdomen or pelvis. Reproductive: Uterus and ovaries are unremarkable in appearance. Dilated left gonadal veins. Other: No significant volume of ascites.  No pneumoperitoneum. Musculoskeletal: There are no aggressive appearing lytic or blastic lesions noted in the visualized portions of the skeleton. IMPRESSION: 1. Persistent and increasing mild to moderate intrahepatic biliary ductal  dilatation and common bile duct dilatation which abruptly tapers in the region of the head of the pancreas. Again, a distal common bile duct stricture is suspected. Correlation with MRCP or ERCP could provide additional diagnostic information if clinically appropriate. 2. There appears to be either high-grade stenosis or occlusion of both the distal splenic vein and distal superior mesenteric veins immediately before the splenoportal confluence. Associated with this there are large collateral vessels, most notable for large gastric varices, which reconstitute the portal vein. 3. Additional incidental findings, as above. Electronically Signed   By: Vinnie Langton M.D.   On: 11/28/2015 21:02    EKG:   Orders placed or performed during the hospital encounter of 03/18/15  . ED EKG  . ED EKG  . EKG 12-Lead  . EKG 12-Lead  . EKG 12-Lead  . EKG 12-Lead  . EKG    ASSESSMENT AND PLAN:   * Alcohol withdrawal  Will keep on CIWA protocol, give IV medications when needed.   Continue IV fluids with multivitamins   psychiatry consult is placed and discussed with Dr. Weber Cooks   *  Acute on chronicAbdominal pain and nausea tolerating liquid diet Lipase is normal  Has history of hepatitis C  And chronic pancreatitis  CT scan of the abdomen shows intrahepatic ductal dilation. LFTs are normal.  Consult gastroenterology     * Anxiety  Continue clonazepam.  * Hypothyroidism  Continue levothyroxine.  * Smoking  Counseled to quit for 4 minutes, offered nicotine patch.      All the records are reviewed and case discussed with Care Management/Social Workerr. Management plans discussed with the patient, family and they are in agreement.  CODE STATUS: fc TOTAL TIME TAKING  CARE OF THIS PATIENT: 35  minutes.   POSSIBLE D/C IN 1-2 DAYS, DEPENDING ON CLINICAL CONDITION.   Nicholes Mango M.D on 11/29/2015 at 3:37 PM  Between 7am to 6pm - Pager - [phone] After 6pm go to  [email or web address] - password [email or web address] [facility]  Office  [phone]  CC: Primary care physician; Kathrine Haddock, NP

## 2015-11-29 NOTE — Consult Note (Signed)
Please see full GI consult by Mrs. Bridges. Patient seen and examined chart reviewed. GI consulted in regards to dilated bile ducts and chronic hepatitis C. Patient admitted to "detox".  Extensive review of chart done. It is of note that she has had findings of dilated bile ducts since at least 2013. At that time she had a very severe pancreatitis and duodenitis. There is some To do ERCP at that time which was unsuccessful due to pyloric stenosis as well as stenosis of the duodenum. Were unable to access beyond the pylorus. It was also endoscopic gastrostomy done to help evacuate the pancreatic pseudocyst. The stent was removed in July 2016. Is also a endoscopic ultrasound attempted at that time however again the duodenum could not be accessed due to scarring of the duodenum and severe pyloric stenosis. Recommendation at that time included surgical consultation for gastrojejunostomy bypass these areas of severe stenosis. I feel this would be best done at a tertiary institution as recommended previously.  Agree with assessment and plan as per Ms. Bridges note. Will follow at a distance.

## 2015-11-29 NOTE — Consult Note (Signed)
GI Inpatient Consult Note  Reason for Consult: Dilated hepatic ducts    Attending Requesting Consult: Dr. Anselm Jungling  History of Present Illness: [name] is a 50 y.o. female seen for evaluation of Dr. Anselm Jungling at the request of dilated hepatic ducts. She has PMHx of alcohol abuse, cocaine abuse, hypothyroidism, depression, hepatitis C, COPD, chronic pancreatitis.  She is a fair historian, most of history is obtained form her significant other at bedside. Reports coming to ED for assistance w/ detox. Reported rectal bleeding developing over the past year w/ defecation, painful rectal bleeding. Has alternating bowel habits of daily BM followed by can go several days without a BM. She does not use anything for BM. She has chronic abd pain, unable to elaborate but unchanged over the past year. It is mainly located in LUQ. Doesn't feel abd pain improves w/ defecation or passing flatus. She feels bloated and swollen at times. She has vomiting about every other day x months. No hematemesis. Uses Zantac for GERD symptoms. No dysphagia or odynophagia. Not on PPI. Denies NSAIDS.   She had a seizure with her last withdrawal attempt 3 years ago. She drinks (2) 40oz beer per day and has occasional supplemental liqour use as well. She smokes 1/2 PPI. She uses cocaine approximately 1-2x/week for abd pain. She has previously used more often.    Last Colonoscopy: None prior    Last Endoscopy: 2016 EUS Impression: - No gross lesions in esophagus. - Pre-existing gastric stent, removed. - Gastric stenosis was found at the pylorus and first part  of the duodenum (via small caliber upper endoscope). - Endosonographic imaging of the pancreas showed  sonographic changes consistent with moderate chronic  pancreatitis. - Feeding tube placement was successfully performed. There  appears to be narrowing at the ligament of Treitz. Recommendation: - Return patient to hospital ward for ongoing care. - Patient will  likely need surgical gastrojejunostomy. An  upper GI can be performed to assess.    Past Medical History:  Past Medical History  Diagnosis Date  . Asthma   . History of drug use ([place])   . Alcohol abuse   . Hypothyroidism   . Depression   . Hepatitis C   . COPD (chronic obstructive pulmonary disease) ([place])   . Cancer ([place])     "carcinoma of throat" with s/p thyroidectomy  . Seizures ([place])   . Low back pain   . Chronic pain syndrome   . Chronic pancreatitis ([place])   . History of alcohol abuse     Problem List: Patient Active Problem List   Diagnosis Date Noted  . Common bile duct obstruction 11/29/2015  . Alcohol withdrawal ([place]) 11/28/2015  . Substance abuse 07/23/2015  . Chronic pain syndrome 07/20/2015  . COPD (chronic obstructive pulmonary disease) ([place]) 07/20/2015  . Hepatitis C 07/20/2015  . Low back pain 07/20/2015  . Alcohol use disorder, severe, dependence ([place]) 03/22/2015  . 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 Pancreatitis 03/21/2015  . Chronic pancreatitis ([place]) 03/18/2015  . Pancreatic pseudocyst 03/18/2015  . Alcohol abuse 03/18/2015  . Hypothyroidism 03/18/2015  . Depression 03/18/2015  . Abdominal pain, epigastric 03/18/2015    Past Surgical History: Past Surgical History  Procedure Laterality Date  . Total thyroidectomy    . Cesarean section    . Gastrostomy tube placement  2011    Allergies: Allergies  Allergen Reactions  . Penicillins Rash and Other (See Comments)    Has patient  had a PCN reaction causing immediate rash, facial/tongue/throat swelling, SOB or lightheadedness with hypotension: No Has patient had a PCN reaction causing severe rash involving mucus membranes or skin necrosis: No Has patient had a PCN reaction that required hospitalization No Has patient had a PCN reaction occurring within the last 10 years: No If all of the above answers are "NO", then may proceed with  Cephalosporin use.    Home Medications: Prescriptions prior to admission  Medication Sig Dispense Refill Last Dose  . albuterol (PROVENTIL HFA;VENTOLIN HFA) 108 (90 BASE) MCG/ACT inhaler Inhale 2 puffs into the lungs every 6 (six) hours as needed for wheezing or shortness of breath.   11/27/2015 at Unknown time  . clonazePAM (KLONOPIN) 1 MG tablet Take 1 mg by mouth 3 (three) times daily as needed for anxiety.    11/27/2015 at Unknown time  . diphenhydrAMINE (BENADRYL) 25 MG tablet Take 25 mg by mouth at bedtime as needed for allergies.   11/27/2015 at Unknown time  . DULoxetine (CYMBALTA) 60 MG capsule Take 1 capsule (60 mg total) by mouth daily. 90 capsule 3 11/27/2015 at Unknown time  . levothyroxine (SYNTHROID, LEVOTHROID) 200 MCG tablet Take 1 tablet (200 mcg total) by mouth daily. Pt takes with 50 mcg tablet. 30 tablet 1 11/27/2015 at Unknown time  . levothyroxine (SYNTHROID, LEVOTHROID) 50 MCG tablet Take 1 tablet (50 mcg total) by mouth daily. Pt takes with 200 mcg tablet. 30 tablet 1 11/27/2015 at Unknown time  . Misc Natural Products (HOT FLASHEX PO) Take 1 tablet by mouth at bedtime.   11/27/2015 at Unknown time   Home medication reconciliation was completed with the patient.   Scheduled Inpatient Medications:   . DULoxetine  60 mg Oral Daily  . fentaNYL  12.5 mcg Transdermal Q72H  . folic acid  1 mg Oral Daily  . folic acid  1 mg Oral Daily  . heparin  5,000 Units Subcutaneous 3 times per day  . levothyroxine  200 mcg Oral Q0600  . LORazepam  0-4 mg Oral Q6H   Followed by  . [START ON 11/30/2015] LORazepam  0-4 mg Oral Q12H  . multivitamin with minerals  1 tablet Oral Daily  . multivitamin with minerals  1 tablet Oral Daily  . thiamine  100 mg Oral Daily   Or  . thiamine  100 mg Intravenous Daily    Continuous Inpatient Infusions:     PRN Inpatient Medications:  albuterol, clonazePAM, diphenhydrAMINE, HYDROmorphone (DILAUDID) injection, LORazepam **OR** LORazepam, LORazepam,  morphine injection, nicotine, ondansetron (ZOFRAN) IV  Family History: family history includes Arthritis in her mother; CAD in her maternal grandfather; Cancer in her father and mother; Diabetes Mellitus II in her maternal aunt, maternal grandfather, and mother; Liver cancer in her mother; Pancreatic cancer in her paternal aunt; Thyroid disease in her son.    Social History:  See HPI.     Review of Systems: Constitutional: Weight is stable.  Eyes: No changes in vision. ENT: No oral lesions, sore throat.  GI: see HPI.  Heme/Lymph: No easy bruising.  CV: No chest pain.  GU: No hematuria.  Integumentary: No rashes.  Neuro: No headaches.  Psych: No depression/anxiety.  Endocrine: No heat/cold intolerance.  Allergic/Immunologic: No urticaria.  Resp: No cough, SOB.  Musculoskeletal: No joint swelling.    Physical Examination: BP 134/93 mmHg  Pulse 77  Temp(Src) 98.4 F (36.9 C) (Oral)  Resp 18  Ht 5\' 1"  (1.549 m)  Wt 115 lb 9.6 oz (52.436 kg)  BMI 21.85 kg/m2  SpO2 96%  LMP  (LMP Unknown) Gen: NAD, alert and oriented x 4, fair historian.   HEENT: PEERLA, EOMI, Neck: supple, no JVD or thyromegaly Chest: CTA bilaterally, no wheezes, crackles, or other adventitious sounds CV: RRR, no m/g/c/r Abd: soft, NT, ? Mild distention, +BS in all four quadrants; no HSM, guarding, ridigity, or rebound tenderness Ext: no edema, well perfused with 2+ pulses, Skin: no rash or lesions noted Lymph: no LAD  Data: Lab Results  Component Value Date   WBC 6.2 11/29/2015   HGB 12.1 11/29/2015   HCT 35.6 11/29/2015   MCV 106.1* 11/29/2015   PLT 160 11/29/2015    Recent Labs Lab 11/28/15 1916 11/29/15 0500  HGB 13.7 12.1   Lab Results  Component Value Date   NA 135 11/29/2015   K 3.3* 11/29/2015   CL 105 11/29/2015   CO2 25 11/29/2015   BUN 16 11/29/2015   CREATININE 0.56 11/29/2015   Lab Results  Component Value Date   ALT 31 11/28/2015   AST 41 11/28/2015   ALKPHOS 87  11/28/2015   BILITOT 0.6 11/28/2015    Recent Labs Lab 11/28/15 1917  APTT 28  INR 1.05    EUS 02/2015 Impression: - No gross lesions in esophagus. - Pre-existing gastric stent, removed. - Gastric stenosis was found at the pylorus and first part  of the duodenum (via small caliber upper endoscope). - Endosonographic imaging of the pancreas showed  sonographic changes consistent with moderate chronic  pancreatitis. - Feeding tube placement was successfully performed. There  appears to be narrowing at the ligament of Treitz. Recommendation: - Return patient to hospital ward for ongoing care. - Patient will likely need surgical gastrojejunostomy. An  upper GI can be performed to assess.   11/28/15 CT a.p IMPRESSION: 1. Persistent and increasing mild to moderate intrahepatic biliary ductal dilatation and common bile duct dilatation which abruptly tapers in the region of the head of the pancreas. Again, a distal common bile duct stricture is suspected. Correlation with MRCP or ERCP could provide additional diagnostic information if clinically appropriate. 2. There appears to be either high-grade stenosis or occlusion of both the distal splenic vein and distal superior mesenteric veins immediately before the splenoportal confluence. Associated with this there are large collateral vessels, most notable for large gastric varices, which reconstitute the portal vein. 3. Additional incidental findings, as above.  Assessment/Plan: [name] is a 50 y.o. female with complex history of pancreatitis followed by [facility].    1. Pancreatitis -history of chronic pancreatitis and recurrent pancreatic pseudocyst formation requiring gastrodudoenostomy tube for drainage of pancreatic pseudocyst. Last seen at [facility] with with pyloric and duodenal stenosis, recommended surgical gastrojejunostomy which she did not follow through on recommendations for. Her dilated bile ducts are likely due to the stenosis and not a new  or emergent finding. She needs surgical referral to [facility] after completing detox and counseling. Unable to repair locally.   2. Hepatitis C-not a treatment candidate given current alcohol and drug abuse. Can address when documented sobriety.  3. Rectal bleeding/rectal pain-sounds anal outlet but unable to assess given she refuses a rectal exam. Hgb stable.     Recommendations: 1. CIWA protocol per primary team 2. Outpatient f/up with [facility] Surgery (established) 3. Detox/substance abuse counseling.   *Case discussed w/ Dr. Gustavo Lah.   Thank you for the consult. Please call with questions or concerns.  Ronney Asters, PA-C [place]

## 2015-11-29 NOTE — Progress Notes (Signed)
Staff found a 40 ounce beer that was half gone in pt's purse. This taken and wasted in the med room. Dr. Lavetta Nielsen was notified.

## 2015-11-30 LAB — BASIC METABOLIC PANEL
Anion gap: 5 (ref 5–15)
BUN: 6 mg/dL (ref 6–20)
CALCIUM: 8.7 mg/dL — AB (ref 8.9–10.3)
CO2: 26 mmol/L (ref 22–32)
CREATININE: 0.47 mg/dL (ref 0.44–1.00)
Chloride: 106 mmol/L (ref 101–111)
Glucose, Bld: 97 mg/dL (ref 65–99)
Potassium: 3.3 mmol/L — ABNORMAL LOW (ref 3.5–5.1)
SODIUM: 137 mmol/L (ref 135–145)

## 2015-11-30 LAB — MAGNESIUM: MAGNESIUM: 1.8 mg/dL (ref 1.7–2.4)

## 2015-11-30 MED ORDER — THIAMINE HCL 100 MG PO TABS: 100.0000 mg | ORAL_TABLET | Freq: Every day | ORAL | Status: DC

## 2015-11-30 MED ORDER — ADULT MULTIVITAMIN W/MINERALS CH: 1.0000 | ORAL_TABLET | Freq: Every day | ORAL | Status: DC

## 2015-11-30 MED ORDER — NICOTINE 21 MG/24HR TD PT24: 21.0000 mg | MEDICATED_PATCH | Freq: Every day | TRANSDERMAL | Status: DC

## 2015-11-30 MED ORDER — FOLIC ACID 1 MG PO TABS: 1.0000 mg | ORAL_TABLET | Freq: Every day | ORAL | Status: DC

## 2015-11-30 MED ORDER — FENTANYL 12 MCG/HR TD PT72: 12.5000 ug | MEDICATED_PATCH | TRANSDERMAL | Status: DC

## 2015-11-30 MED ORDER — POTASSIUM CHLORIDE 20 MEQ PO PACK: 40.0000 meq | PACK | Freq: Once | ORAL | Status: DC

## 2015-11-30 MED ORDER — OXYCODONE HCL 5 MG PO TABS: 5.0000 mg | ORAL_TABLET | Freq: Four times a day (QID) | ORAL | Status: DC | PRN

## 2015-11-30 MED ORDER — POTASSIUM CHLORIDE 20 MEQ PO PACK
40.0000 meq | PACK | Freq: Once | ORAL | Status: AC
  Administered 2015-11-30: 08:00:00 40 meq via ORAL
  Filled 2015-11-30: qty 2

## 2015-11-30 NOTE — Consult Note (Addendum)
Patient discussed with Dr Margaretmary Eddy.[name]  Results of CT of abdomen reviewed with vascular, and I believe the findings regarding the distal splenic and distal superior mesenteric veins are likely not to be cause of abdominal pain but rather the sequella of pancreatitis, with marked collaterals reconstituting the portal vein. Would continue plans for her to see [facility] surgery/GI.  When I arrived on the unit patient was leaving and said she was going to [facility] for follow up.

## 2015-11-30 NOTE — Consult Note (Signed)
Patient was very very anxious to leave the hospital as she said her ride was waiting for her and could not wait much longer  reviewed discharge instructions, prescriptions and and home medications with patient and patient verbalized understanding, discharged with nursing to visitors entrance, patient walked out to awaiting car.

## 2015-11-30 NOTE — Discharge Summary (Signed)
[facility] NAME: [name]    MR#:  [id number]  DATE OF BIRTH:  July 12, 1966  DATE OF ADMISSION:  11/28/2015 ADMITTING PHYSICIAN: Vaughan Basta, MD  DATE OF DISCHARGE: 11/30/15 PRIMARY CARE PHYSICIAN: Kathrine Haddock, NP    ADMISSION DIAGNOSIS:  Intractable abdominal pain [R10.9] Bile duct abnormality [K83.9] Alcohol dependence with unspecified alcohol-induced disorder ([place]) [F10.29]  DISCHARGE DIAGNOSIS:  Principal Problem:   Alcohol withdrawal ([place]) Active Problems:   Common bile duct obstruction  Chronic pancreatitis SECONDARY DIAGNOSIS:   Past Medical History  Diagnosis Date  . Asthma   . History of drug use ([place])   . Alcohol abuse   . Hypothyroidism   . Depression   . Hepatitis C   . COPD (chronic obstructive pulmonary disease) ([place])   . Cancer ([place])     "carcinoma of throat" with s/p thyroidectomy  . Seizures ([place])   . Low back pain   . Chronic pain syndrome   . Chronic pancreatitis ([place])   . History of alcohol abuse     HOSPITAL COURSE:  * Alcohol withdrawal   CIWA protocol. No withdrawal symptoms  Provided  multivitamins  Given IV fluids  discussed with Dr. clapacs-psychiatry regarding inpatient detoxification, which is not available at our facility and he has recommended outpatient rehabilitation center follow-up. Patient already has information regarding rehabilitation center and will follow-up with them   * Acute on chronicAbdominal pain and nausea with severe pyloric stenosis and duodenal stenosis and chronic pancreatitis with pseudocyst tolerating liquid diet-continue liquid diet only until surgery Lipase is normal  CT scan of the abdomen shows intrahepatic ductal dilation. LFTs are normal.  Consult gastroenterology -discussed with Dr. Jaquita Rector who has recommended the patient  to follow-up with [facility] GI surgery for gastrojejunostomy bypass. Patient is aware. She has reported that  she has established care at [facility] and going to [place] directly soon after d/c from [facility] Continue pain management with fentanyl patch and Percocet as needed. Outpatient follow-up with pain management is recommended    * Anxiety  Continue clonazepam.  * Hypothyroidism  Continue levothyroxine.  * Smoking  Counseled to quit for 4 minutes, offered nicotine patch.      DISCHARGE CONDITIONS:   fair  CONSULTS OBTAINED:  Treatment Team:  Lollie Sails, MD   PROCEDURES none  DRUG ALLERGIES:   Allergies  Allergen Reactions  . Penicillins Rash and Other (See Comments)    Has patient had a PCN reaction causing immediate rash, facial/tongue/throat swelling, SOB or lightheadedness with hypotension: No Has patient had a PCN reaction causing severe rash involving mucus membranes or skin necrosis: No Has patient had a PCN reaction that required hospitalization No Has patient had a PCN reaction occurring within the last 10 years: No If all of the above answers are "NO", then may proceed with Cephalosporin use.    DISCHARGE MEDICATIONS:   Current Discharge Medication List    START taking these medications   Details  fentaNYL (DURAGESIC - DOSED MCG/HR) 12 MCG/HR Place 1 patch (12.5 mcg total) onto the skin every 3 (three) days. Qty: 5 patch, Refills: 0    folic acid (FOLVITE) 1 MG tablet Take 1 tablet (1 mg total) by mouth daily.    Multiple Vitamin (MULTIVITAMIN WITH MINERALS) TABS tablet Take 1 tablet by mouth daily.    nicotine (NICODERM CQ - DOSED IN MG/24 HOURS) 21 mg/24hr patch Place 1 patch (21 mg total) onto  the skin daily. Qty: 28 patch, Refills: 0    oxyCODONE (OXY IR/ROXICODONE) 5 MG immediate release tablet Take 1 tablet (5 mg total) by mouth every 6 (six) hours as needed (mild pain). Qty: 15 tablet, Refills: 0    thiamine 100 MG tablet Take 1 tablet (100 mg total) by mouth daily.      CONTINUE these medications which have NOT CHANGED   Details   albuterol (PROVENTIL HFA;VENTOLIN HFA) 108 (90 BASE) MCG/ACT inhaler Inhale 2 puffs into the lungs every 6 (six) hours as needed for wheezing or shortness of breath.    clonazePAM (KLONOPIN) 1 MG tablet Take 1 mg by mouth 3 (three) times daily as needed for anxiety.     diphenhydrAMINE (BENADRYL) 25 MG tablet Take 25 mg by mouth at bedtime as needed for allergies.    DULoxetine (CYMBALTA) 60 MG capsule Take 1 capsule (60 mg total) by mouth daily. Qty: 90 capsule, Refills: 3    !! levothyroxine (SYNTHROID, LEVOTHROID) 200 MCG tablet Take 1 tablet (200 mcg total) by mouth daily. Pt takes with 50 mcg tablet. Qty: 30 tablet, Refills: 1    !! levothyroxine (SYNTHROID, LEVOTHROID) 50 MCG tablet Take 1 tablet (50 mcg total) by mouth daily. Pt takes with 200 mcg tablet. Qty: 30 tablet, Refills: 1    Misc Natural Products (HOT FLASHEX PO) Take 1 tablet by mouth at bedtime.     !! - Potential duplicate medications found. Please discuss with provider.       DISCHARGE INSTRUCTIONS:  Activity - as tolerated  Diet - clear liquid diet Follow-up with primary care physician in a week Follow-up with gastroenterology at [facility] in 1 week Follow-up with outpatient alcohol rehabilitation- [facility] in a week    DIET:  Clear liquid diet  DISCHARGE CONDITION:  Fair  ACTIVITY:  Activity as tolerated  OXYGEN:  Home Oxygen: No.   Oxygen Delivery: room air  DISCHARGE LOCATION:  home   If you experience worsening of your admission symptoms, develop shortness of breath, life threatening emergency, suicidal or homicidal thoughts you must seek medical attention immediately by calling 911 or calling your MD immediately  if symptoms less severe.  You Must read complete instructions/literature along with all the possible adverse reactions/side effects for all the Medicines you take and that have been prescribed to you. Take any new Medicines after you have completely understood and accpet all the possible  adverse reactions/side effects.   Please note  You were cared for by a hospitalist during your hospital stay. If you have any questions about your discharge medications or the care you received while you were in the hospital after you are discharged, you can call the unit and asked to speak with the hospitalist on call if the hospitalist that took care of you is not available. Once you are discharged, your primary care physician will handle any further medical issues. Please note that NO REFILLS for any discharge medications will be authorized once you are discharged, as it is imperative that you return to your primary care physician (or establish a relationship with a primary care physician if you do not have one) for your aftercare needs so that they can reassess your need for medications and monitor your lab values.     Today  Chief Complaint  Patient presents with  . detox    patient is reporting chronic abdominal pain. Tolerating clear liquid diet. Gastroenterology is recommending clear liquid diet until seen by [facility]. Patient was already  seen by [facility], needs to follow up with them as an outpatient for detox    [facility] gastroenterology is recommending clear liquid diet until seen by [facility] gastroenterology:  CONSTITUTIONAL: Denies fevers, chills. Denies any fatigue, weakness.  EYES: Denies blurry vision, double vision, eye pain. EARS, NOSE, THROAT: Denies tinnitus, ear pain, hearing loss. RESPIRATORY: Denies cough, wheeze, shortness of breath.  CARDIOVASCULAR: Denies chest pain, palpitations, edema.  GASTROINTESTINAL: Denies nausea, vomiting, diarrhea,  has chronic abdominal pain. Denies bright red blood per rectum. GENITOURINARY: Denies dysuria, hematuria. ENDOCRINE: Denies nocturia or thyroid problems. HEMATOLOGIC AND LYMPHATIC: Denies easy bruising or bleeding. SKIN: Denies rash or lesion. MUSCULOSKELETAL: Denies pain in neck, back, shoulder, knees, hips or arthritic symptoms.   NEUROLOGIC: Denies paralysis, paresthesias.  PSYCHIATRIC: Denies anxiety or depressive symptoms.   VITAL SIGNS:  Blood pressure 121/90, pulse 89, temperature 98 F (36.7 C), temperature source Oral, resp. rate 20, height 5\' 1"  (1.549 m), weight 52.436 kg (115 lb 9.6 oz), SpO2 100 %.  I/O:    Intake/Output Summary (Last 24 hours) at 11/30/15 1348 Last data filed at 11/30/15 1300  Gross per 24 hour  Intake    760 ml  Output    600 ml  Net    160 ml    PHYSICAL EXAMINATION:  GENERAL:  50 y.o.-year-old patient lying in the bed with no acute distress.  EYES: Pupils equal, round, reactive to light and accommodation. No scleral icterus. Extraocular muscles intact.  HEENT: Head atraumatic, normocephalic. Oropharynx and nasopharynx clear.  NECK:  Supple, no jugular venous distention. No thyroid enlargement, no tenderness.  LUNGS: Normal breath sounds bilaterally, no wheezing, rales,rhonchi or crepitation. No use of accessory muscles of respiration.  CARDIOVASCULAR: S1, S2 normal. No murmurs, rubs, or gallops.  ABDOMEN: Soft, ch diffuse tender,  no rebound tenderness, non-distended. Bowel sounds present. No organomegaly or mass.  EXTREMITIES: No pedal edema, cyanosis, or clubbing.  NEUROLOGIC: Cranial nerves II through XII are intact. Muscle strength 5/5 in all extremities. Sensation intact. Gait not checked.  PSYCHIATRIC: The patient is alert and oriented x 3.  SKIN: No obvious rash, lesion, or ulcer.   DATA REVIEW:   CBC  Recent Labs Lab 11/29/15 0500  WBC 6.2  HGB 12.1  HCT 35.6  PLT 160    Chemistries   Recent Labs Lab 11/28/15 1916  11/30/15 0455  NA 135  < > 137  K 3.4*  < > 3.3*  CL 104  < > 106  CO2 22  < > 26  GLUCOSE 102*  < > 97  BUN 19  < > 6  CREATININE 0.63  < > 0.47  CALCIUM 8.8*  < > 8.7*  MG  --   < > 1.8  AST 41  --   --   ALT 31  --   --   ALKPHOS 87  --   --   BILITOT 0.6  --   --   < > = values in this interval not displayed.  Cardiac  Enzymes No results for input(s): TROPONINI in the last 168 hours.  Microbiology Results  Results for orders placed or performed in visit on 03/15/12  Urine culture     Status: None   Collection Time: 03/15/12  2:52 AM  Result Value Ref Range Status   Micro Text Report   Final       SOURCE: UNKNOWN    ORGANISM 1  20,000 CFU/ML COAGULASE NEGATIVE STAPHYLOCOCCUS   ORGANISM 2                >100,000 CFU AEROBIC GRAM POS ROD,NO FURTHER ID   ANTIBIOTIC                                                        RADIOLOGY:  Ct Abdomen Pelvis W Contrast  11/28/2015  CLINICAL DATA:  50 year old female with history of epigastric pain for the past 2 days. Bright red blood per rectum today. History of pancreatitis. EXAM: CT ABDOMEN AND PELVIS WITH CONTRAST TECHNIQUE: Multidetector CT imaging of the abdomen and pelvis was performed using the standard protocol following bolus administration of intravenous contrast. CONTRAST:  4mL OMNIPAQUE IOHEXOL 300 MG/ML  SOLN COMPARISON:  CT the abdomen and pelvis 03/18/2015. FINDINGS: Lower chest:  Unremarkable. Hepatobiliary: No cystic or solid hepatic lesions. Mild to moderate intrahepatic biliary ductal dilatation. Common bile duct measures up to 11 mm in the porta hepatis, but tapers abruptly in the region of the pancreatic head. Gallbladder is unremarkable in appearance. Pancreas: No pancreatic mass. No pancreatic ductal dilatation. No pancreatic or peripancreatic fluid or inflammatory changes. Spleen: Unremarkable. Adrenals/Urinary Tract: Bilateral adrenal glands and bilateral kidneys are normal in appearance. No hydroureteronephrosis. Urinary bladder is normal in appearance. Stomach/Bowel: Stomach is moderately distended, but otherwise unremarkable in appearance. Previously demonstrated pancreatic pseudocyst along the lesser curvature of the stomach has resolved. No pathologic dilatation of small bowel or colon. Normal appendix. Vascular/Lymphatic:  Atherosclerosis throughout the abdominal and pelvic vasculature, without evidence of aneurysm or dissection. 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 The proximal portal vein is dilated measuring up to 15 mm in diameter, and appears to be fed by numerous collateral vessels, most notable for some large gastric varices. No lymphadenopathy noted in the abdomen or pelvis. Reproductive: Uterus and ovaries are unremarkable in appearance. Dilated left gonadal veins. Other: No significant volume of ascites.  No pneumoperitoneum. Musculoskeletal: There are no aggressive appearing lytic or blastic lesions noted in the visualized portions of the skeleton. IMPRESSION: 1. Persistent and increasing mild to moderate intrahepatic biliary ductal dilatation and common bile duct dilatation which abruptly tapers in the region of the head of the pancreas. Again, a distal common bile duct stricture is suspected. Correlation with MRCP or ERCP could provide additional diagnostic information if clinically appropriate. 2. There appears to be either high-grade stenosis or occlusion of both the distal splenic vein and distal superior mesenteric veins immediately before the splenoportal confluence. Associated with this there are large collateral vessels, most notable for large gastric varices, which reconstitute the portal vein. 3. Additional incidental findings, as above. Electronically Signed   By: Vinnie Langton M.D.   On: 11/28/2015 21:02   Dg Abd Acute W/chest  11/29/2015  CLINICAL DATA:  Abdominal pain EXAM: DG ABDOMEN ACUTE W/ 1V CHEST COMPARISON:  CT scan from yesterday FINDINGS: The heart, hila, mediastinum, lungs, and pleura are normal. No free air, portal venous gas, or pneumatosis. Contrast is seen throughout the colon. No evidence of bowel obstruction. The bones are unremarkable. IMPRESSION: Negative abdominal  radiographs.  No acute cardiopulmonary disease. Electronically Signed   By: Dorise Bullion III M.D   On: 11/29/2015  16:38    EKG:   Orders placed or performed during the hospital encounter of 03/18/15  . ED EKG  . ED EKG  . EKG 12-Lead  . EKG 12-Lead  . EKG 12-Lead  . EKG 12-Lead  . EKG      Management plans discussed with the patient, family and they are in agreement.  CODE STATUS:     Code Status Orders        Start     Ordered   11/29/15 0209  Full code   Continuous     11/29/15 0208    Code Status History    Date Active Date Inactive Code Status Order ID Comments User Context   03/21/2015  7:17 PM 03/26/2015  1:41 PM Full Code [id number]  Aldean Jewett, MD Inpatient   03/18/2015  8:26 PM 03/21/2015  2:16 PM Full Code [id number]  Lance Coon, MD Inpatient      TOTAL TIME TAKING CARE OF THIS PATIENT: 45  minutes.    @[facility] @  on 11/30/2015 at 1:48 PM  Between 7am to 6pm - Pager - [phone]  After 6pm go to [email or web address] - password [email or web address] [facility]  Office  [phone]  CC: Primary care physician; Kathrine Haddock, NP

## 2015-11-30 NOTE — Discharge Instructions (Signed)
Activity - as tolerated  Diet - clear liquid diet Follow-up with primary care physician in a week Follow-up with gastroenterology dr.Grimm and [facility]  at [facility] in 3 days Follow-up with outpatient alcohol rehabilitation- [facility] in a week

## 2015-12-06 ENCOUNTER — Emergency Department
Admission: EM | Admit: 2015-12-06 | Discharge: 2015-12-07 | Disposition: A | Discharge status: Other Acute Inpatient Hospital | Payer: MEDICAID | Attending: Emergency Medicine | Admitting: Emergency Medicine

## 2015-12-06 ENCOUNTER — Encounter: Payer: Self-pay | Admitting: Emergency Medicine

## 2015-12-06 DIAGNOSIS — R1110 Vomiting, unspecified: Secondary | ICD-10-CM | POA: Insufficient documentation

## 2015-12-06 DIAGNOSIS — Z880 Allergy status to penicillin: Secondary | ICD-10-CM | POA: Insufficient documentation

## 2015-12-06 DIAGNOSIS — Z79899 Other long term (current) drug therapy: Secondary | ICD-10-CM | POA: Insufficient documentation

## 2015-12-06 DIAGNOSIS — R1084 Generalized abdominal pain: Secondary | ICD-10-CM | POA: Insufficient documentation

## 2015-12-06 DIAGNOSIS — R109 Unspecified abdominal pain: Secondary | ICD-10-CM

## 2015-12-06 DIAGNOSIS — R197 Diarrhea, unspecified: Secondary | ICD-10-CM | POA: Insufficient documentation

## 2015-12-06 DIAGNOSIS — F1410 Cocaine abuse, uncomplicated: Secondary | ICD-10-CM | POA: Insufficient documentation

## 2015-12-06 DIAGNOSIS — F17210 Nicotine dependence, cigarettes, uncomplicated: Secondary | ICD-10-CM | POA: Insufficient documentation

## 2015-12-06 DIAGNOSIS — F1210 Cannabis abuse, uncomplicated: Secondary | ICD-10-CM | POA: Insufficient documentation

## 2015-12-06 DIAGNOSIS — F1910 Other psychoactive substance abuse, uncomplicated: Secondary | ICD-10-CM

## 2015-12-06 DIAGNOSIS — G8929 Other chronic pain: Secondary | ICD-10-CM | POA: Insufficient documentation

## 2015-12-06 NOTE — ED Notes (Signed)
MD at bedside. 

## 2015-12-06 NOTE — ED Notes (Addendum)
Pt from home via EMS. Pt c/o RUQ abd pain with vomiting and dirrhea. Pt has hx of pancreatitis. Also states she has blood in her stools for the past month but this week has been worse. Pt states she is an alcoholic and last drink was this morning and smoked marijuana , . Last use of cocaine was a few days ago. 32oz of beer. Pt A&O, denies any fevers.

## 2015-12-07 LAB — URINALYSIS COMPLETE WITH MICROSCOPIC (ARMC ONLY)
BILIRUBIN URINE: NEGATIVE
Bacteria, UA: NONE SEEN
Glucose, UA: NEGATIVE mg/dL
HGB URINE DIPSTICK: NEGATIVE
KETONES UR: NEGATIVE mg/dL
LEUKOCYTES UA: NEGATIVE
NITRITE: NEGATIVE
PH: 7 (ref 5.0–8.0)
Protein, ur: NEGATIVE mg/dL
RBC / HPF: NONE SEEN RBC/hpf (ref 0–5)
Specific Gravity, Urine: 1.005 (ref 1.005–1.030)

## 2015-12-07 LAB — CBC WITH DIFFERENTIAL/PLATELET
BASOS ABS: 0.1 10*3/uL (ref 0–0.1)
Basophils Relative: 1 %
Eosinophils Absolute: 0 10*3/uL (ref 0–0.7)
Eosinophils Relative: 0 %
HEMATOCRIT: 36.2 % (ref 35.0–47.0)
Hemoglobin: 12.5 g/dL (ref 12.0–16.0)
LYMPHS PCT: 22 %
Lymphs Abs: 1.8 10*3/uL (ref 1.0–3.6)
MCH: 36.5 pg — ABNORMAL HIGH (ref 26.0–34.0)
MCHC: 34.7 g/dL (ref 32.0–36.0)
MCV: 105.3 fL — AB (ref 80.0–100.0)
MONO ABS: 0.5 10*3/uL (ref 0.2–0.9)
MONOS PCT: 6 %
NEUTROS ABS: 5.8 10*3/uL (ref 1.4–6.5)
Neutrophils Relative %: 71 %
Platelets: 272 10*3/uL (ref 150–440)
RBC: 3.43 MIL/uL — ABNORMAL LOW (ref 3.80–5.20)
RDW: 13.3 % (ref 11.5–14.5)
WBC: 8.1 10*3/uL (ref 3.6–11.0)

## 2015-12-07 LAB — COMPREHENSIVE METABOLIC PANEL
ALK PHOS: 93 U/L (ref 38–126)
ALT: 20 U/L (ref 14–54)
AST: 25 U/L (ref 15–41)
Albumin: 3.4 g/dL — ABNORMAL LOW (ref 3.5–5.0)
Anion gap: 6 (ref 5–15)
BILIRUBIN TOTAL: 0.4 mg/dL (ref 0.3–1.2)
BUN: 13 mg/dL (ref 6–20)
CALCIUM: 8.7 mg/dL — AB (ref 8.9–10.3)
CO2: 26 mmol/L (ref 22–32)
Chloride: 105 mmol/L (ref 101–111)
Creatinine, Ser: 0.46 mg/dL (ref 0.44–1.00)
GFR calc Af Amer: >60 mL/min (ref >60)
Glucose, Bld: 90 mg/dL (ref 65–99)
POTASSIUM: 3.8 mmol/L (ref 3.5–5.1)
Sodium: 137 mmol/L (ref 135–145)
TOTAL PROTEIN: 7.6 g/dL (ref 6.5–8.1)

## 2015-12-07 LAB — URINE DRUG SCREEN, QUALITATIVE (ARMC ONLY)
Amphetamines, Ur Screen: NOT DETECTED
BARBITURATES, UR SCREEN: NOT DETECTED
BENZODIAZEPINE, UR SCRN: NOT DETECTED
Cannabinoid 50 Ng, Ur ~~LOC~~: POSITIVE — AB
Cocaine Metabolite,Ur ~~LOC~~: POSITIVE — AB
MDMA (Ecstasy)Ur Screen: NOT DETECTED
METHADONE SCREEN, URINE: NOT DETECTED
Opiate, Ur Screen: NOT DETECTED
Phencyclidine (PCP) Ur S: NOT DETECTED
TRICYCLIC, UR SCREEN: NOT DETECTED

## 2015-12-07 LAB — ETHANOL: ALCOHOL ETHYL (B): 145 mg/dL — AB (ref <5)

## 2015-12-07 LAB — LIPASE, BLOOD: LIPASE: 20 U/L (ref 11–51)

## 2015-12-07 MED ORDER — LORAZEPAM 2 MG PO TABS: 0.0000 mg | ORAL_TABLET | Freq: Two times a day (BID) | ORAL | Status: DC

## 2015-12-07 MED ORDER — ONDANSETRON HCL 4 MG/2ML IJ SOLN
4.0000 mg | Freq: Once | INTRAMUSCULAR | Status: AC
  Administered 2015-12-07: 4 mg via INTRAVENOUS
  Filled 2015-12-07: qty 2

## 2015-12-07 MED ORDER — LORAZEPAM 2 MG/ML IJ SOLN: 1.0000 mg | Freq: Once | INTRAMUSCULAR | Status: DC

## 2015-12-07 MED ORDER — LORAZEPAM 2 MG PO TABS
0.0000 mg | ORAL_TABLET | Freq: Four times a day (QID) | ORAL | Status: DC
  Administered 2015-12-07: 0.5 mg via ORAL
  Administered 2015-12-07: 1 mg via ORAL
  Filled 2015-12-07 (×2): qty 1

## 2015-12-07 MED ORDER — ACETAMINOPHEN 325 MG PO TABS
650.0000 mg | ORAL_TABLET | Freq: Once | ORAL | Status: AC
  Administered 2015-12-07: 650 mg via ORAL
  Filled 2015-12-07: qty 2

## 2015-12-07 MED ORDER — LORAZEPAM 1 MG PO TABS
1.0000 mg | ORAL_TABLET | Freq: Once | ORAL | Status: AC
  Administered 2015-12-07: 1 mg via ORAL
  Filled 2015-12-07: qty 1

## 2015-12-07 MED ORDER — HYDROMORPHONE HCL 1 MG/ML IJ SOLN
1.0000 mg | Freq: Once | INTRAMUSCULAR | Status: AC
  Administered 2015-12-07: 1 mg via INTRAVENOUS
  Filled 2015-12-07: qty 1

## 2015-12-07 MED ORDER — LEVOTHYROXINE SODIUM 75 MCG PO TABS
250.0000 ug | ORAL_TABLET | Freq: Every day | ORAL | Status: DC
  Administered 2015-12-07: 250 ug via ORAL
  Filled 2015-12-07: qty 1

## 2015-12-07 MED ORDER — NICOTINE 14 MG/24HR TD PT24
14.0000 mg | MEDICATED_PATCH | Freq: Once | TRANSDERMAL | Status: DC
  Administered 2015-12-07: 14 mg via TRANSDERMAL
  Filled 2015-12-07: qty 1

## 2015-12-07 MED ORDER — DULOXETINE HCL 60 MG PO CPEP
60.0000 mg | ORAL_CAPSULE | Freq: Every day | ORAL | Status: DC
  Administered 2015-12-07: 60 mg via ORAL
  Filled 2015-12-07: qty 1

## 2015-12-07 MED ORDER — KETOROLAC TROMETHAMINE 30 MG/ML IJ SOLN
30.0000 mg | Freq: Once | INTRAMUSCULAR | Status: AC
  Administered 2015-12-07: 30 mg via INTRAVENOUS
  Filled 2015-12-07: qty 1

## 2015-12-07 NOTE — BH Assessment (Signed)
Patient was provided phone to have, phone interview with [facility] Brown-[phone] ext [phone]).  After patient talked with [facility], Probation officer received phone call from [facility], stating the patient was rude and "smarting off." [facility] staff stated, they ended the phone interview and she would not admit her "until she drop the attitude." When [facility] asked her about her Marijuana use and left of time, patient replied, "I started when I was 16, now you do the mouth..." They report of her answering other questions in a similar minor.  Writer talked with ER MD (Dr. Burlene Arnt) and patient will discharge home with outpatient referral information.

## 2015-12-07 NOTE — ED Notes (Addendum)
Pt. To [facility] from ED ambulatory without difficulty, to room  . Report from [place], Therapist, sports. Pt. Is alert and oriented, warm and dry in no distress. Pt. Denies SI, HI, and AVH. Pt. Calm and cooperative. Pt states she drinks 2-40 oz beers daily and smokes THC daily. Pt reports she smokes cocaine once or twice a month that she can't afford. Pt. Made aware of security cameras and Q15 minute rounds. Pt. Encouraged to let Nursing staff know of any concerns or needs. Sandwich and soft drink given.

## 2015-12-07 NOTE — ED Notes (Signed)
Patient asleep in room. No noted distress or abnormal behavior. Will continue 15 minute checks and observation by security cameras for safety. 

## 2015-12-07 NOTE — Discharge Instructions (Signed)

## 2015-12-07 NOTE — ED Notes (Signed)
Pt denies SI/HI and AVH. All belongings returned to pt. Pt discharged to lobby to wait for her ride.

## 2015-12-07 NOTE — ED Provider Notes (Signed)
[facility] Emergency Department Provider Note  ____________________________________________  Time seen: 11:40 PM  I have reviewed the triage vital signs and the nursing notes.   HISTORY  Chief Complaint Abdominal Pain      HPI [name] is a 50 y.o. female with history of alcohol abuse, polysubstance abuse presents with generalized abdominal pain worsening right upper quadrant accompanied by vomiting and diarrhea. Patient states she's has noted bright red blood in her stools for the past month with acute worsening this week. Patient states that she drank 32 ounces of beer today less than usual for her, last cocaine use a few days ago.     Past Medical History  Diagnosis Date  . Asthma   . History of drug use ([place])   . Alcohol abuse   . Hypothyroidism   . Depression   . Hepatitis C   . COPD (chronic obstructive pulmonary disease) ([place])   . Cancer ([place])     "carcinoma of throat" with s/p thyroidectomy  . Seizures ([place])   . Low back pain   . Chronic pain syndrome   . Chronic pancreatitis ([place])   . History of alcohol abuse     Patient Active Problem List   Diagnosis Date Noted  . Common bile duct obstruction 11/29/2015  . Alcohol withdrawal ([place]) 11/28/2015  . Substance abuse 07/23/2015  . Chronic pain syndrome 07/20/2015  . COPD (chronic obstructive pulmonary disease) ([place]) 07/20/2015  . Hepatitis C 07/20/2015  . Low back pain 07/20/2015  . Alcohol use disorder, severe, dependence ([place]) 03/22/2015  . 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 Pancreatitis 03/21/2015  . Chronic pancreatitis ([place]) 03/18/2015  . Pancreatic pseudocyst 03/18/2015  . Alcohol abuse 03/18/2015  . Hypothyroidism 03/18/2015  . Depression 03/18/2015  . Abdominal pain, epigastric 03/18/2015    Past Surgical History  Procedure Laterality Date  . Total thyroidectomy    . Cesarean section    . Gastrostomy tube  placement  2011    Current Outpatient Rx  Name  Route  Sig  Dispense  Refill  . albuterol (PROVENTIL HFA;VENTOLIN HFA) 108 (90 BASE) MCG/ACT inhaler   Inhalation   Inhale 2 puffs into the lungs every 6 (six) hours as needed for wheezing or shortness of breath.         . clonazePAM (KLONOPIN) 1 MG tablet   Oral   Take 1 mg by mouth 3 (three) times daily as needed for anxiety.          . diphenhydrAMINE (BENADRYL) 25 MG tablet   Oral   Take 25 mg by mouth at bedtime as needed for allergies.         . DULoxetine (CYMBALTA) 60 MG capsule   Oral   Take 1 capsule (60 mg total) by mouth daily.   90 capsule   3   . fentaNYL (DURAGESIC - DOSED MCG/HR) 12 MCG/HR   Transdermal   Place 1 patch (12.5 mcg total) onto the skin every 3 (three) days.   5 patch   0   . folic acid (FOLVITE) 1 MG tablet   Oral   Take 1 tablet (1 mg total) by mouth daily.         Marland Kitchen levothyroxine (SYNTHROID, LEVOTHROID) 200 MCG tablet   Oral   Take 1 tablet (200 mcg total) by mouth daily. Pt takes with 50 mcg tablet.   30 tablet   1   . levothyroxine (SYNTHROID,  LEVOTHROID) 50 MCG tablet   Oral   Take 1 tablet (50 mcg total) by mouth daily. Pt takes with 200 mcg tablet.   30 tablet   1   . Misc Natural Products (HOT FLASHEX PO)   Oral   Take 1 tablet by mouth at bedtime.         . Multiple Vitamin (MULTIVITAMIN WITH MINERALS) TABS tablet   Oral   Take 1 tablet by mouth daily.         . nicotine (NICODERM CQ - DOSED IN MG/24 HOURS) 21 mg/24hr patch   Transdermal   Place 1 patch (21 mg total) onto the skin daily.   28 patch   0   . oxyCODONE (OXY IR/ROXICODONE) 5 MG immediate release tablet   Oral   Take 1 tablet (5 mg total) by mouth every 6 (six) hours as needed (mild pain).   15 tablet   0   . thiamine 100 MG tablet   Oral   Take 1 tablet (100 mg total) by mouth daily.           Allergies Penicillins  Family History  Problem Relation Age of Onset  . Diabetes  [place]ell[place] II Maternal Aunt   . Pancreatic cancer Paternal Aunt   . Diabetes Mellitus II Maternal Grandfather   . CAD Maternal Grandfather   . Diabetes Mellitus II Mother   . Liver cancer Mother   . Arthritis Mother   . Cancer Mother     lung  . Cancer Father   . Thyroid disease Son     Social History Social History  Substance Use Topics  . Smoking status: Current Every Day Smoker -- 0.50 packs/day    Types: Cigarettes  . Smokeless tobacco: Never Used  . Alcohol Use: 1.8 oz/week    3 Glasses of wine, 0 Standard drinks or equivalent per week     Comment: History of alcohol use daily    Review of Systems  Constitutional: Negative for fever. Eyes: Negative for visual changes. ENT: Negative for sore throat. Cardiovascular: Negative for chest pain. Respiratory: Negative for shortness of breath. Gastrointestinal: Positive for abdominal pain, vomiting and diarrhea. Genitourinary: Negative for dysuria. Musculoskeletal: Negative for back pain. Skin: Negative for rash. Neurological: Negative for headaches, focal weakness or numbness.   10-point ROS otherwise negative.  ____________________________________________   PHYSICAL EXAM:  VITAL SIGNS: ED Triage Vitals  Enc Vitals Group     BP 12/06/15 2342 139/100 mmHg     Pulse Rate 12/06/15 2342 97     Resp 12/06/15 2342 16     Temp 12/06/15 2342 98.1 F (36.7 C)     Temp Source 12/06/15 2342 Oral     SpO2 12/06/15 2342 97 %     Weight 12/06/15 2342 110 lb (49.896 kg)     Height 12/06/15 2342 5\' 1"  (1.549 m)     Head Cir --      Peak Flow --      Pain Score 12/06/15 2342 8     Pain Loc --      Pain Edu? --      Excl. in [place]? --      Constitutional: Alert and oriented. Well appearing and in no distress. Eyes: Conjunctivae are normal. PERRL. Normal extraocular movements. ENT   Head: Normocephalic and atraumatic.   Nose: No congestion/rhinnorhea.   Mouth/Throat: Mucous membranes are moist.   Neck: No  stridor. Hematological/Lymphatic/Immunilogical: No cervical lymphadenopathy. Cardiovascular: Normal rate, regular rhythm. Normal  and symmetric distal pulses are present in all extremities. No murmurs, rubs, or gallops. Respiratory: Normal respiratory effort without tachypnea nor retractions. Breath sounds are clear and equal bilaterally. No wheezes/rales/rhonchi. Gastrointestinal: Tender to palpation right upper quadrant epigastric left upper quadrant. No distention. There is no CVA tenderness. Genitourinary: deferred Musculoskeletal: Nontender with normal range of motion in all extremities. No joint effusions.  No lower extremity tenderness nor edema. Neurologic:  Normal speech and language. No gross focal neurologic deficits are appreciated. Speech is normal.  Skin:  Skin is warm, dry and intact. No rash noted. Psychiatric: Mood and affect are normal. Speech and behavior are normal. Patient exhibits appropriate insight and judgment.  ____________________________________________    LABS (pertinent positives/negatives)  Labs Reviewed  CBC WITH DIFFERENTIAL/PLATELET - Abnormal; Notable for the following:    RBC 3.43 (*)    MCV 105.3 (*)    MCH 36.5 (*)    All other components within normal limits  COMPREHENSIVE METABOLIC PANEL - Abnormal; Notable for the following:    Calcium 8.7 (*)    Albumin 3.4 (*)    All other components within normal limits  ETHANOL - Abnormal; Notable for the following:    Alcohol, Ethyl (B) 145 (*)    All other components within normal limits  URINE DRUG SCREEN, QUALITATIVE ([facility] ONLY) - Abnormal; Notable for the following:    Cocaine Metabolite,Ur [place] POSITIVE (*)    Cannabinoid 50 Ng, Ur [place] POSITIVE (*)    All other components within normal limits  URINALYSIS COMPLETEWITH MICROSCOPIC ([facility] ONLY) - Abnormal; Notable for the following:    Color, Urine STRAW (*)    APPearance CLEAR (*)    Squamous Epithelial / LPF 0-5 (*)    All other components within  normal limits  LIPASE, BLOOD        INITIAL IMPRESSION / ASSESSMENT AND PLAN / ED COURSE  Pertinent labs & imaging results that were available during my care of the patient were reviewed by me and considered in my medical decision making (see chart for details).  Patient chronic abdominal pain early substance abuse requesting detox at this time. Patient accepted by [facility] and will be discharged there. ____________________________________________   FINAL CLINICAL IMPRESSION(S) / ED DIAGNOSES  Final diagnoses:  Chronic abdominal pain  Polysubstance abuse      Gregor Hams, MD 12/07/15 [phone]

## 2015-12-07 NOTE — BHH Counselor (Signed)
Patient presenting to the ED with a  history of polysubstance abuse requesting detox. Patient states that she drank 32 ounces of beer today less than usual for her, last cocaine use a few days ago.  TTS Counselor spoke to [place], [place], who reported that their facility has a female bed available.  Faxed patient's referral packet to [facility] for review for placement.

## 2015-12-07 NOTE — BHH Counselor (Signed)
Referral for detox placement submitted to [facility], [place] and [facility]

## 2015-12-07 NOTE — ED Notes (Signed)
Patient advised to try and get some sleep. Patient very restless in bed. Patient is laying down then sitting and talking to self in bed.

## 2015-12-07 NOTE — ED Provider Notes (Addendum)
-----------------------------------------   7:54 AM on 12/07/2015 -----------------------------------------   Blood pressure 129/96, pulse 97, temperature 98.1 F (36.7 C), temperature source Oral, resp. rate 16, height 5\' 1"  (1.549 m), weight 110 lb (49.896 kg), SpO2 97 %.  The patient had no acute events since last update.  Calm and cooperative at this time.  Disposition is pending per Psychiatry/Behavioral Medicine team recommendations.     Schuyler Amor, MD 12/07/15 0754  ----------------------------------------- 9:18 AM on 12/07/2015 -----------------------------------------  Patient with no clinical evidence of withdrawal but she is somewhat anxious and upset. We did give her Ativan.  She also wants pain medication for chronic abdominal pain. On exam, her abdomen is benign with no evidence of acute pathology noted. Unfortunately, she is here for detoxification and unfortunately we do not offer that service and we have tried multiple different places we cannot place her. She does not meet criteria for acute inpatient stay in the hospital for other reasons and we'll discharge her home  for outpatient detoxification programs and we have given her information about these. Return precautions and follow-up given and understood.  Schuyler Amor, MD 12/07/15 [id number]  Schuyler Amor, MD 12/07/15 [phone]

## 2015-12-07 NOTE — BHH Counselor (Signed)
Received phone call from [place], with [facility], advising that patient would not be appropriate for placement due to the facility's policy against prescribing narcotics and patient's chronic abdominal pain.

## 2015-12-07 NOTE — ED Notes (Signed)
Pt very anxious and upset upon hearing she will likely be discharged. Pt denies SI/HI and AVH. Pain 6/10. ER MD notified. See Mar. Maintained on 15 minute checks and observation by security camera for safety.

## 2015-12-07 NOTE — ED Notes (Addendum)
Pt has called for a ride. Pt getting dressed for discharge. Pt upset about leaving but denies SI/HI and AVH. Pt keeps stating she doesn't understand why it is so hard for her to receive treatment. Pt tearful. All belongings will be returned to pt. Maintained on 15 minute checks and observation by security camera for safety.

## 2015-12-12 ENCOUNTER — Other Ambulatory Visit: Payer: Self-pay | Admitting: Unknown Physician Specialty

## 2016-02-03 ENCOUNTER — Other Ambulatory Visit: Payer: Self-pay | Admitting: Unknown Physician Specialty

## 2016-04-14 ENCOUNTER — Other Ambulatory Visit: Payer: Self-pay | Admitting: Unknown Physician Specialty

## 2016-04-14 NOTE — Telephone Encounter (Signed)
Needs seen further refills 

## 2016-07-25 ENCOUNTER — Other Ambulatory Visit: Payer: Self-pay | Admitting: Unknown Physician Specialty

## 2016-08-01 ENCOUNTER — Other Ambulatory Visit: Payer: Self-pay | Admitting: Unknown Physician Specialty

## 2016-09-19 ENCOUNTER — Other Ambulatory Visit: Payer: Self-pay | Admitting: Unknown Physician Specialty

## 2016-12-08 ENCOUNTER — Other Ambulatory Visit: Payer: Self-pay | Admitting: Unknown Physician Specialty

## 2016-12-08 MED ORDER — LEVOTHYROXINE SODIUM 200 MCG PO TABS: 200.0000 ug | ORAL_TABLET | Freq: Every day | ORAL | 0 refills | Status: DC

## 2016-12-08 MED ORDER — LEVOTHYROXINE SODIUM 50 MCG PO TABS: 50.0000 ug | ORAL_TABLET | Freq: Every day | ORAL | 0 refills | Status: DC

## 2016-12-08 MED ORDER — DULOXETINE HCL 60 MG PO CPEP: 60.0000 mg | ORAL_CAPSULE | Freq: Every day | ORAL | 0 refills | Status: DC

## 2016-12-08 MED ORDER — ALBUTEROL SULFATE HFA 108 (90 BASE) MCG/ACT IN AERS: 2.0000 | INHALATION_SPRAY | Freq: Four times a day (QID) | RESPIRATORY_TRACT | 12 refills | Status: DC | PRN

## 2016-12-08 NOTE — Telephone Encounter (Signed)
Patient called stating she is needing refills on the following meds ASAP  She is completely out of her synthroid 250 for over a week. She said she needs these sent to [facility]   Thanks   [phone]

## 2016-12-08 NOTE — Telephone Encounter (Signed)
Routing to provider. Please route back to me after refill as patient is overdue for appointment.

## 2016-12-09 NOTE — Telephone Encounter (Signed)
Tried calling patient again, went straight to VM. Left VM apologizing for dropped calls earlier and asked for her to give me a call back when she could.

## 2016-12-09 NOTE — Telephone Encounter (Signed)
Called and was speaking with patient, call was dropped twice. Moved to another phone and tried calling patient back. Patient did not answer, will try to call again later and apologize to patient for dropped calls.

## 2016-12-10 NOTE — Telephone Encounter (Signed)
Left message for her to call me.   thanks

## 2016-12-12 NOTE — Telephone Encounter (Signed)
Letter generated and sent to patient.  

## 2016-12-22 ENCOUNTER — Encounter: Payer: Self-pay | Admitting: Unknown Physician Specialty

## 2016-12-22 ENCOUNTER — Ambulatory Visit (INDEPENDENT_AMBULATORY_CARE_PROVIDER_SITE_OTHER): Payer: Self-pay | Admitting: Unknown Physician Specialty

## 2016-12-22 DIAGNOSIS — F322 Major depressive disorder, single episode, severe without psychotic features: Secondary | ICD-10-CM

## 2016-12-22 DIAGNOSIS — J449 Chronic obstructive pulmonary disease, unspecified: Secondary | ICD-10-CM

## 2016-12-22 DIAGNOSIS — E039 Hypothyroidism, unspecified: Secondary | ICD-10-CM

## 2016-12-22 DIAGNOSIS — M25532 Pain in left wrist: Secondary | ICD-10-CM | POA: Insufficient documentation

## 2016-12-22 MED ORDER — LEVOTHYROXINE SODIUM 200 MCG PO TABS: 200.0000 ug | ORAL_TABLET | Freq: Every day | ORAL | 0 refills | Status: DC

## 2016-12-22 MED ORDER — ALBUTEROL SULFATE HFA 108 (90 BASE) MCG/ACT IN AERS: 2.0000 | INHALATION_SPRAY | Freq: Four times a day (QID) | RESPIRATORY_TRACT | 12 refills | Status: DC | PRN

## 2016-12-22 MED ORDER — LEVOTHYROXINE SODIUM 50 MCG PO TABS: 50.0000 ug | ORAL_TABLET | Freq: Every day | ORAL | 0 refills | Status: DC

## 2016-12-22 MED ORDER — DULOXETINE HCL 60 MG PO CPEP: 60.0000 mg | ORAL_CAPSULE | Freq: Every day | ORAL | 1 refills | Status: DC

## 2016-12-22 NOTE — Assessment & Plan Note (Signed)
Recommend spica splint.  Order x-ray as secondary to trauma

## 2016-12-22 NOTE — Assessment & Plan Note (Signed)
Worsening due to stress.  Will reassess after on thyroid for 2 months

## 2016-12-22 NOTE — Assessment & Plan Note (Signed)
Worsening.  Refilll Albuterol.  Discuss tobacco cessation

## 2016-12-22 NOTE — Assessment & Plan Note (Signed)
Refill meds and recheck in 2 months

## 2016-12-22 NOTE — Patient Instructions (Signed)
Thumb spica

## 2016-12-22 NOTE — Progress Notes (Signed)
BP 137/86 (BP Location: Left Arm, Patient Position: Sitting, Cuff Size: Normal)   Pulse (!) 111   Temp 98.3 F (36.8 C)   Ht 5' 2.2" (1.58 m)   Wt 110 lb 3.2 oz (50 kg)   LMP  (LMP Unknown)   SpO2 97%   BMI 20.03 kg/m    Subjective:    Patient ID: [name], female    DOB: [birth year], 51 y.o.   MRN: [id number]  HPI: [name] is a 51 y.o. female  Chief Complaint  Patient presents with  . Depression  . Hypothyroidism   Pt states she needs her medication.  States she needs her Cymbalta, inhaler, and Synthroid.  She admits to stopping the Synthroid for 2 weeks and back on it for a week.  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 Depression screen [facility] 2/9 12/22/2016 07/23/2015  Decreased Interest 1 1  Down, Depressed, Hopeless 1 0  PHQ - 2 Score 2 1  Altered sleeping 3 2  Tired, decreased energy 3 3  Change in appetite 3 2  Feeling bad or failure about yourself  1 0  Trouble concentrating 2 1  Moving slowly or fidgety/restless 0 0  Suicidal thoughts 0 0  PHQ-9 Score 14 9     Hypothyroid This has been under variable control in the past with and without compliance.  Will not check today as has been out and will check in 2 months  COPD States her breathing is not good.  She is still smoking.  She is trying to switch to lower nicotine which is not helping. Takes Albuterol intermittently when she coughs.     Wrist Left wrist hit 2-3 weeks ago.  Getting worse and very painful.  "Massive pain" with movement.  Weraring a wrist splint.    Relevant past medical, surgical, family and social history reviewed and updated as indicated. Interim medical history since our last visit reviewed. Allergies and medications reviewed and updated.  Review of Systems  Per HPI unless specifically indicated above     Objective:    BP 137/86 (BP  Location: Left Arm, Patient Position: Sitting, Cuff Size: Normal)   Pulse (!) 111   Temp 98.3 F (36.8 C)   Ht 5' 2.2" (1.58 m)   Wt 110 lb 3.2 oz (50 kg)   LMP  (LMP Unknown)   SpO2 97%   BMI 20.03 kg/m   Wt Readings from Last 3 Encounters:  12/22/16 110 lb 3.2 oz (50 kg)  12/06/15 110 lb (49.9 kg)  11/29/15 115 lb 9.6 oz (52.4 kg)    Physical Exam  Constitutional: She is oriented to person, place, and time. No distress.  Chronically ill appearing  HENT:  Head: Normocephalic and atraumatic.  Eyes: Conjunctivae and lids are normal. Right eye exhibits no discharge. Left eye exhibits no discharge. No scleral icterus.  Neck: Normal range of motion. Neck supple. No JVD present. Carotid bruit is not present.  Cardiovascular: Normal rate, regular rhythm and normal heart sounds.   Pulmonary/Chest: Effort normal and breath sounds normal.  Abdominal: Normal appearance. There is no splenomegaly or hepatomegaly.  Musculoskeletal:       Left wrist: She exhibits decreased range of motion, tenderness, bony tenderness and swelling.  Tender left medial radius  Neurological: She is alert and oriented to person,  place, and time.  Skin: Skin is warm, dry and intact. No rash noted. No pallor.  Psychiatric: Judgment and thought content normal. She exhibits a depressed mood.    Results for orders placed or performed during the hospital encounter of 12/06/15  CBC with Differential  Result Value Ref Range   WBC 8.1 3.6 - 11.0 K/uL   RBC 3.43 (L) 3.80 - 5.20 MIL/uL   Hemoglobin 12.5 12.0 - 16.0 g/dL   HCT 36.2 35.0 - 47.0 %   MCV 105.3 (H) 80.0 - 100.0 fL   MCH 36.5 (H) 26.0 - 34.0 pg   MCHC 34.7 32.0 - 36.0 g/dL   RDW 13.3 11.5 - 14.5 %   Platelets 272 150 - 440 K/uL   Neutrophils Relative % 71 %   Neutro Abs 5.8 1.4 - 6.5 K/uL   Lymphocytes Relative 22 %   Lymphs Abs 1.8 1.0 - 3.6 K/uL   Monocytes Relative 6 %   Monocytes Absolute 0.5 0.2 - 0.9 K/uL   Eosinophils Relative 0 %    Eosinophils Absolute 0.0 0 - 0.7 K/uL   Basophils Relative 1 %   Basophils Absolute 0.1 0 - 0.1 K/uL  Comprehensive metabolic panel  Result Value Ref Range   Sodium 137 135 - 145 mmol/L   Potassium 3.8 3.5 - 5.1 mmol/L   Chloride 105 101 - 111 mmol/L   CO2 26 22 - 32 mmol/L   Glucose, Bld 90 65 - 99 mg/dL   BUN 13 6 - 20 mg/dL   Creatinine, Ser 0.46 0.44 - 1.00 mg/dL   Calcium 8.7 (L) 8.9 - 10.3 mg/dL   Total Protein 7.6 6.5 - 8.1 g/dL   Albumin 3.4 (L) 3.5 - 5.0 g/dL   AST 25 15 - 41 U/L   ALT 20 14 - 54 U/L   Alkaline Phosphatase 93 38 - 126 U/L   Total Bilirubin 0.4 0.3 - 1.2 mg/dL   GFR calc non Af Amer >60 >60 mL/min   GFR calc Af Amer >60 >60 mL/min   Anion gap 6 5 - 15  Lipase, blood  Result Value Ref Range   Lipase 20 11 - 51 U/L  Ethanol  Result Value Ref Range   Alcohol, Ethyl (B) 145 (H) <5 mg/dL  Urine Drug Screen, Qualitative ([facility] only)  Result Value Ref Range   Tricyclic, Ur Screen NONE DETECTED NONE DETECTED   Amphetamines, Ur Screen NONE DETECTED NONE DETECTED   MDMA (Ecstasy)Ur Screen NONE DETECTED NONE DETECTED   Cocaine Metabolite,Ur [place] POSITIVE (A) NONE DETECTED   Opiate, Ur Screen NONE DETECTED NONE DETECTED   Phencyclidine (PCP) Ur S NONE DETECTED NONE DETECTED   Cannabinoid 50 Ng, Ur [place] POSITIVE (A) NONE DETECTED   Barbiturates, Ur Screen NONE DETECTED NONE DETECTED   Benzodiazepine, Ur Scrn NONE DETECTED NONE DETECTED   Methadone Scn, Ur NONE DETECTED NONE DETECTED  Urinalysis complete, with microscopic ([facility] only)  Result Value Ref Range   Color, Urine STRAW (A) YELLOW   APPearance CLEAR (A) CLEAR   Glucose, UA NEGATIVE NEGATIVE mg/dL   Bilirubin Urine NEGATIVE NEGATIVE   Ketones, ur NEGATIVE NEGATIVE mg/dL   Specific Gravity, Urine 1.005 1.005 - 1.030   Hgb urine dipstick NEGATIVE NEGATIVE   pH 7.0 5.0 - 8.0   Protein, ur NEGATIVE NEGATIVE mg/dL   Nitrite NEGATIVE NEGATIVE   Leukocytes, UA NEGATIVE NEGATIVE   RBC / HPF NONE SEEN 0 - 5  RBC/hpf   WBC, UA 0-5 0 -  5 WBC/hpf   Bacteria, UA NONE SEEN NONE SEEN   Squamous Epithelial / LPF 0-5 (A) NONE SEEN      Assessment & Plan:   Problem List Items Addressed This Visit      Unprioritized   COPD (chronic obstructive pulmonary disease) (HCC)    Worsening.  Refilll Albuterol.  Discuss tobacco cessation      Relevant Medications   albuterol (PROVENTIL HFA;VENTOLIN HFA) 108 (90 Base) MCG/ACT inhaler   Depression    Worsening due to stress.  Will reassess after on thyroid for 2 months      Relevant Medications   DULoxetine (CYMBALTA) 60 MG capsule   Hypothyroidism    Refill meds and recheck in 2 months      Relevant Medications   levothyroxine (SYNTHROID, LEVOTHROID) 200 MCG tablet   levothyroxine (SYNTHROID, LEVOTHROID) 50 MCG tablet   Left wrist pain    Recommend spica splint.  Order x-ray as secondary to trauma      Relevant Orders   DG Wrist Complete Left       Follow up plan: Return in about 2 months (around 02/21/2017).

## 2017-06-30 ENCOUNTER — Other Ambulatory Visit: Payer: Self-pay | Admitting: Unknown Physician Specialty

## 2017-07-16 ENCOUNTER — Other Ambulatory Visit: Payer: Self-pay | Admitting: Unknown Physician Specialty

## 2017-07-16 NOTE — Telephone Encounter (Signed)
Please see request for Synthroid; hasn't been seen in office since 12/22/16, and was to return in 2 mos. for lab work; hasn't followed-up.

## 2017-07-16 NOTE — Telephone Encounter (Signed)
Copied from [place] #[id number]. Topic: Inquiry >> Jul 16, 2017  3:10 PM [name] wrote: Reason for CRM: PT called and is needing her Synthroid 237mcg and a 68mcg refilled. She is needing it ASAP Alvarado/c she is [facility] to [facility] [place] and she cant get in without those medicines

## 2018-02-16 ENCOUNTER — Other Ambulatory Visit: Payer: Self-pay | Admitting: Unknown Physician Specialty

## 2018-02-18 NOTE — Telephone Encounter (Signed)
Refill request for: levothyroxine 200 MCG tab Levothyroxine 50 MCG tab Duloxetine 60 MG tab Albuterol 108 (90 Base) MCG/ACT inhaler   LOV 12/22/16 Next OV 03/17/18 30 day supply refill only due to last OV >1year.

## 2018-03-17 ENCOUNTER — Ambulatory Visit: Payer: Self-pay | Admitting: Unknown Physician Specialty

## 2018-03-31 ENCOUNTER — Other Ambulatory Visit: Payer: Self-pay

## 2018-03-31 ENCOUNTER — Emergency Department
Admission: EM | Admit: 2018-03-31 | Discharge: 2018-03-31 | Discharge status: Against Medical Advice | Payer: Self-pay | Attending: Emergency Medicine | Admitting: Emergency Medicine

## 2018-03-31 DIAGNOSIS — K0889 Other specified disorders of teeth and supporting structures: Secondary | ICD-10-CM | POA: Insufficient documentation

## 2018-03-31 DIAGNOSIS — Z5321 Procedure and treatment not carried out due to patient leaving prior to being seen by health care provider: Secondary | ICD-10-CM | POA: Insufficient documentation

## 2018-03-31 NOTE — ED Triage Notes (Signed)
Pt in with co toothache states started yesterday.

## 2018-06-17 ENCOUNTER — Other Ambulatory Visit: Payer: Self-pay | Admitting: Unknown Physician Specialty

## 2018-06-17 NOTE — Telephone Encounter (Signed)
Has appt 06/18/18 with Kathrine Haddock.  Renew during OV in case of changes?   Requested Prescriptions  Refused Prescriptions Disp Refills  . levothyroxine (SYNTHROID, LEVOTHROID) 50 MCG tablet [Pharmacy Med Name: LEVOTHYROXINE 50 MCG TABLET] 30 tablet 0    Sig: Take 1 tablet (50 mcg total) by mouth daily before breakfast.     Endocrinology:  Hypothyroid Agents Failed - 06/17/2018  1:12 PM      Failed - TSH needs to be rechecked within 3 months after an abnormal result. Refill until TSH is due.      Failed - TSH in normal range and within 360 days    Thyroid Stimulating Horm  Date Value Ref Range Status  09/19/2011 0.566 uIU/mL Final    Comment:    0.45-4.50 (International Unit)  ----------------------- Pregnant patients have  different reference  ranges for TSH:  - - - - - - - - - -  Pregnant, first trimetser:  0.36 - 2.50 uIU/mL          Failed - Valid encounter within last 12 months    Recent Outpatient Visits          1 year ago Left wrist pain   [place], [place], NP   2 years ago Hypothyroidism, unspecified hypothyroidism type   [facility] Kathrine Haddock, NP      Future Appointments            Tomorrow Kathrine Haddock, NP [place], [facility]         . levothyroxine (SYNTHROID, LEVOTHROID) 200 MCG tablet [Pharmacy Med Name: LEVOTHYROXINE 200 MCG TABLET] 30 tablet 0    Sig: Take 1 tablet (200 mcg total) by mouth daily before breakfast.     Endocrinology:  Hypothyroid Agents Failed - 06/17/2018  1:12 PM      Failed - TSH needs to be rechecked within 3 months after an abnormal result. Refill until TSH is due.      Failed - TSH in normal range and within 360 days    Thyroid Stimulating Horm  Date Value Ref Range Status  09/19/2011 0.566 uIU/mL Final    Comment:    0.45-4.50 (International Unit)  ----------------------- Pregnant patients have  different reference  ranges for TSH:  - - - - - - - - - -  Pregnant,  first trimetser:  0.36 - 2.50 uIU/mL          Failed - Valid encounter within last 12 months    Recent Outpatient Visits          1 year ago Left wrist pain   [place], NP   2 years ago Hypothyroidism, unspecified hypothyroidism type   [facility] Kathrine Haddock, NP      Future Appointments            Tomorrow Kathrine Haddock, NP [facility], [place]

## 2018-06-18 ENCOUNTER — Ambulatory Visit: Payer: Self-pay | Admitting: Unknown Physician Specialty

## 2018-07-27 ENCOUNTER — Encounter: Payer: Self-pay | Admitting: Family Medicine

## 2018-07-27 ENCOUNTER — Ambulatory Visit (INDEPENDENT_AMBULATORY_CARE_PROVIDER_SITE_OTHER): Payer: Self-pay | Admitting: Family Medicine

## 2018-07-27 VITALS — HR 97 | Wt 111.0 lb

## 2018-07-27 DIAGNOSIS — J449 Chronic obstructive pulmonary disease, unspecified: Secondary | ICD-10-CM

## 2018-07-27 DIAGNOSIS — F4001 Agoraphobia with panic disorder: Secondary | ICD-10-CM

## 2018-07-27 DIAGNOSIS — F3181 Bipolar II disorder: Secondary | ICD-10-CM | POA: Insufficient documentation

## 2018-07-27 DIAGNOSIS — I10 Essential (primary) hypertension: Secondary | ICD-10-CM

## 2018-07-27 DIAGNOSIS — G894 Chronic pain syndrome: Secondary | ICD-10-CM

## 2018-07-27 DIAGNOSIS — B1920 Unspecified viral hepatitis C without hepatic coma: Secondary | ICD-10-CM

## 2018-07-27 DIAGNOSIS — Z13220 Encounter for screening for lipoid disorders: Secondary | ICD-10-CM

## 2018-07-27 DIAGNOSIS — Z114 Encounter for screening for human immunodeficiency virus [HIV]: Secondary | ICD-10-CM

## 2018-07-27 DIAGNOSIS — E039 Hypothyroidism, unspecified: Secondary | ICD-10-CM

## 2018-07-27 MED ORDER — LURASIDONE HCL 80 MG PO TABS: ORAL_TABLET | ORAL | 6 refills | Status: DC

## 2018-07-27 NOTE — Assessment & Plan Note (Signed)
Lungs clear today. Call with any concerns.

## 2018-07-27 NOTE — Progress Notes (Addendum)
Pulse 97   Wt 111 lb (50.3 kg)   LMP  (LMP Unknown)   SpO2 100%   BMI 20.97 kg/m    Subjective:    Patient ID: [name], female    DOB: November 06, 1965, 52 y.o.   MRN: [id number]  HPI: [name] is a 52 y.o. female who presents today for evaluation after being lost to follow up for 18 months due to severe agoraphobia.  Chief Complaint  Patient presents with  . Medication Refill  . AgoraphobiA   ANXIETY/STRESS- has severe agoraphobia, does OK when she's on her medicine, but doesn't leave her house. Her husband cares for her 100% and manages all of her out of the house activities. She hasn't been to a grocery store in 17 years. Has been on cymbalta for many years but doesn't feel like it helps. Notes that she has been diagnosed with bipolar in the past and feels like it is getting progressively worse. Has been out of her cymbalta for 3+ months and has been getting worse and worse. Did manage to make it to the office today, but barely and is having a panic attack in the office.  Duration:uncontrolled Anxious mood: yes  Excessive worrying: yes Irritability: yes  Sweating: yes Nausea: yes Palpitations:yes Hyperventilation: yes Panic attacks: yes Agoraphobia: yes  Obscessions/compulsions: yes Depressed mood: yes Depression screen [facility] 2/9 12/22/2016 07/23/2015  Decreased Interest 1 1  Down, Depressed, Hopeless 1 0  PHQ - 2 Score 2 1  Altered sleeping 3 2  Tired, decreased energy 3 3  Change in appetite 3 2  Feeling bad or failure about yourself  1 0  Trouble concentrating 2 1  Moving slowly or fidgety/restless 0 0  Suicidal thoughts 0 0  PHQ-9 Score 14 9   Anhedonia: no Weight changes: no Insomnia: no   Hypersomnia: no Fatigue/loss of energy: yes Feelings of worthlessness: yes Feelings of guilt: yes Impaired concentration/indecisiveness: yes Suicidal ideations: no  Crying spells: yes Recent Stressors/Life Changes: no   Relationship problems: no   Family stress: no       Financial stress: no    Job stress: no    Recent death/loss: no  HYPOTHYROIDISM Thyroid control status:uncontrolled- has been off her medicine for about 3+ months Satisfied with current treatment? no Medication side effects: no Medication compliance: poor compliance Etiology of hypothyroidism: s/p thyroidectomy Recent dose adjustment:no Fatigue: yes Cold intolerance: yes Heat intolerance: no Weight gain: no Weight loss: no Constipation: no Diarrhea/loose stools: no Palpitations: yes Lower extremity edema: no Anxiety/depressed mood: yes  Active Ambulatory Problems    Diagnosis Date Noted  . Chronic pancreatitis ([place]) 03/18/2015  . Hypothyroidism 03/18/2015  . Alcohol use disorder, severe, dependence ([place]) 03/22/2015  . 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 Chronic pain syndrome 07/20/2015  . COPD (chronic obstructive pulmonary disease) ([place]) 07/20/2015  . Hepatitis C 07/20/2015  . Substance abuse ([place]) 07/23/2015  . Hypertension 03/18/2013  . Mixed, or nondependent drug abuse, in remission ([place]) 12/26/2010  . Tobacco use disorder 01/14/2011  . Bipolar 2 disorder ([place]) 07/27/2018  . 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 Pancreatic pseudocyst 03/18/2015  . Alcohol abuse 03/18/2015  . Depression 03/18/2015  . Abdominal pain, epigastric 03/18/2015  . Pancreatitis 03/21/2015  . Abdominal pain 04/23/2012  . Alcohol withdrawal ([place]) 11/28/2015  . Common bile duct obstruction 11/29/2015  . Left wrist pain  12/22/2016  . Anxiety state 12/26/2010  . Disorder of stomach and duodenum 02/14/2011  . Major depressive disorder, single episode 12/26/2010  . Nondependent alcohol abuse, in remission 03/18/2013  . 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   Past Medical History:  Diagnosis Date  . Asthma   . Cancer ([place])   . History of alcohol abuse   . History of  drug use   . Low back pain   . Seizures ([place])    Past Surgical History:  Procedure Laterality Date  . CESAREAN SECTION    . GASTROSTOMY TUBE PLACEMENT  2011  . TOTAL THYROIDECTOMY     Outpatient Encounter Medications as of 07/27/2018  Medication Sig  . clonazePAM (KLONOPIN) 1 MG tablet Take 1 mg by mouth 3 (three) times daily as needed for anxiety.   . diphenhydrAMINE (BENADRYL) 25 MG tablet Take 25 mg by mouth at bedtime as needed for allergies.  Marland Kitchen levothyroxine (SYNTHROID, LEVOTHROID) 50 MCG tablet Take 1 tablet (50 mcg total) by mouth daily before breakfast.  . Misc Natural Products (HOT FLASHEX PO) Take 1 tablet by mouth at bedtime.  Marland Kitchen PROAIR HFA 108 (90 Base) MCG/ACT inhaler Inhale 2 puffs into the lungs every 6 (six) hours as needed for wheezing or shortness of breath.  . [DISCONTINUED] DULoxetine (CYMBALTA) 60 MG capsule Take 1 capsule (60 mg total) by mouth daily.  . [DISCONTINUED] fentaNYL (DURAGESIC - DOSED MCG/HR) 12 MCG/HR Place 1 patch (12.5 mcg total) onto the skin every 3 (three) days. (Patient not taking: Reported on 12/22/2016)  . [DISCONTINUED] folic acid (FOLVITE) 1 MG tablet Take 1 tablet (1 mg total) by mouth daily. (Patient not taking: Reported on 12/22/2016)  . [DISCONTINUED] levothyroxine (SYNTHROID, LEVOTHROID) 200 MCG tablet Take 1 tablet (200 mcg total) by mouth daily before breakfast.  . [DISCONTINUED] lurasidone (LATUDA) 80 MG TABS tablet Take 1/2 tab a day for 1 week, then increase to 1 tab daily  . [DISCONTINUED] Multiple Vitamin (MULTIVITAMIN WITH MINERALS) TABS tablet Take 1 tablet by mouth daily. (Patient not taking: Reported on 12/22/2016)  . [DISCONTINUED] nicotine (NICODERM CQ - DOSED IN MG/24 HOURS) 21 mg/24hr patch Place 1 patch (21 mg total) onto the skin daily. (Patient not taking: Reported on 12/22/2016)  . [DISCONTINUED] oxyCODONE (OXY IR/ROXICODONE) 5 MG immediate release tablet Take 1 tablet (5 mg total) by mouth every 6 (six) hours as needed (mild pain).  (Patient not taking: Reported on 12/22/2016)  . [DISCONTINUED] thiamine 100 MG tablet Take 1 tablet (100 mg total) by mouth daily. (Patient not taking: Reported on 12/22/2016)   No facility-administered encounter medications on file as of 07/27/2018.    Allergies  Allergen Reactions  . Penicillins Rash and Other (See Comments)    Has patient had a PCN reaction causing immediate rash, facial/tongue/throat swelling, SOB or lightheadedness with hypotension: No Has patient had a PCN reaction causing severe rash involving mucus membranes or skin necrosis: No Has patient had a PCN reaction that required hospitalization No Has patient had a PCN reaction occurring within the last 10 years: No If all of the above answers are "NO", then may proceed with Cephalosporin use.   Social History   Socioeconomic History  . Marital status: Single    Spouse name: Not on file  . Number of children: Not on file  . Years of education: Not on file  . Highest education level: Not on file  Occupational History  . Not on file  Social Needs  . Financial resource strain: Not  on file  . Food insecurity:    Worry: Not on file    Inability: Not on file  . Transportation needs:    Medical: Not on file    Non-medical: Not on file  Tobacco Use  . Smoking status: Current Every Day Smoker    Packs/day: 0.50    Types: Cigarettes  . Smokeless tobacco: Never Used  Substance and Sexual Activity  . Alcohol use: Yes    Alcohol/week: 0.0 standard drinks    Comment: pt states she has a drink every day  . Drug use: Yes    Types: Marijuana, Cocaine    Comment: History of drug use  . Sexual activity: Not on file  Lifestyle  . Physical activity:    Days per week: Not on file    Minutes per session: Not on file  . Stress: Not on file  Relationships  . Social connections:    Talks on phone: Not on file    Gets together: Not on file    Attends religious service: Not on file    Active member of club or organization:  Not on file    Attends meetings of clubs or organizations: Not on file    Relationship status: Not on file  Other Topics Concern  . Not on file  Social History Narrative  . Not on file   Family History  Problem Relation Age of Onset  . Diabetes Mellitus II Maternal Grandfather   . CAD Maternal Grandfather   . Diabetes Mellitus II Mother   . Liver cancer Mother   . Arthritis Mother   . Cancer Mother        lung  . Cancer Father   . Thyroid disease Son   . Diabetes Mellitus II Maternal Aunt   . Pancreatic cancer Paternal Aunt     Review of Systems  Constitutional: Negative.   Respiratory: Positive for chest tightness and shortness of breath. Negative for apnea, cough, choking, wheezing and stridor.   Cardiovascular: Positive for palpitations. Negative for chest pain and leg swelling.  Gastrointestinal: Negative.   Musculoskeletal: Negative.   Skin: Negative.   Neurological: Negative.   Psychiatric/Behavioral: Positive for agitation, behavioral problems, decreased concentration and dysphoric mood. Negative for confusion, hallucinations, self-injury, sleep disturbance and suicidal ideas. The patient is nervous/anxious. The patient is not hyperactive.     Per HPI unless specifically indicated above     Objective:    Pulse 97   Wt 111 lb (50.3 kg)   LMP  (LMP Unknown)   SpO2 100%   BMI 20.97 kg/m   Wt Readings from Last 3 Encounters:  07/27/18 111 lb (50.3 kg)  03/31/18 110 lb (49.9 kg)  12/22/16 110 lb 3.2 oz (50 kg)    Physical Exam  Constitutional: She is oriented to person, place, and time. She appears well-developed and well-nourished. No distress.  HENT:  Head: Normocephalic and atraumatic.  Right Ear: Hearing normal.  Left Ear: Hearing normal.  Nose: Nose normal.  Eyes: Conjunctivae and lids are normal. Right eye exhibits no discharge. Left eye exhibits no discharge. No scleral icterus.  Cardiovascular: Normal rate, regular rhythm, normal heart sounds and  intact distal pulses. Exam reveals no gallop and no friction rub.  No murmur heard. Pulmonary/Chest: Effort normal and breath sounds normal. No stridor. No respiratory distress. She has no wheezes. She has no rales. She exhibits no tenderness.  Musculoskeletal: Normal range of motion.  Neurological: She is alert and oriented to person, place,  and time.  Skin: Skin is warm, dry and intact. Capillary refill takes less than 2 seconds. No rash noted. She is not diaphoretic. No erythema. No pallor.  Psychiatric: Judgment and thought content normal. Her mood appears anxious. Her affect is labile. Her speech is rapid and/or pressured. She is agitated. Cognition and memory are normal. She exhibits a depressed mood.  Nursing note and vitals reviewed.   Results for orders placed or performed in visit on 07/27/18  CBC with Differential/Platelet  Result Value Ref Range   WBC 6.6 3.4 - 10.8 x10E3/uL   RBC 3.56 (L) 3.77 - 5.28 x10E6/uL   Hemoglobin 12.8 11.1 - 15.9 g/dL   Hematocrit 36.6 34.0 - 46.6 %   MCV 103 (H) 79 - 97 fL   MCH 36.0 (H) 26.6 - 33.0 pg   MCHC 35.0 31.5 - 35.7 g/dL   RDW 12.4 12.3 - 15.4 %   Platelets 289 150 - 450 x10E3/uL   Neutrophils 53 Not Estab. %   Lymphs 40 Not Estab. %   Monocytes 5 Not Estab. %   Eos 1 Not Estab. %   Basos 1 Not Estab. %   Neutrophils Absolute 3.5 1.4 - 7.0 x10E3/uL   Lymphocytes Absolute 2.6 0.7 - 3.1 x10E3/uL   Monocytes Absolute 0.4 0.1 - 0.9 x10E3/uL   EOS (ABSOLUTE) 0.1 0.0 - 0.4 x10E3/uL   Basophils Absolute 0.1 0.0 - 0.2 x10E3/uL   Immature Granulocytes 0 Not Estab. %   Immature Grans (Abs) 0.0 0.0 - 0.1 x10E3/uL  Comprehensive metabolic panel  Result Value Ref Range   Glucose 84 65 - 99 mg/dL   BUN 9 6 - 24 mg/dL   Creatinine, Ser 0.77 0.57 - 1.00 mg/dL   GFR calc non Af Amer 88 >59 mL/min/1.73   GFR calc Af Amer 102 >59 mL/min/1.73   BUN/Creatinine Ratio 12 9 - 23   Sodium 139 134 - 144 mmol/L   Potassium 3.9 3.5 - 5.2 mmol/L    Chloride 102 96 - 106 mmol/L   CO2 19 (L) 20 - 29 mmol/L   Calcium 9.3 8.7 - 10.2 mg/dL   Total Protein 8.0 6.0 - 8.5 g/dL   Albumin 4.9 3.5 - 5.5 g/dL   Globulin, Total 3.1 1.5 - 4.5 g/dL   Albumin/Globulin Ratio 1.6 1.2 - 2.2   Bilirubin Total 0.7 0.0 - 1.2 mg/dL   Alkaline Phosphatase 77 39 - 117 IU/L   AST 96 (H) 0 - 40 IU/L   ALT 41 (H) 0 - 32 IU/L  HIV Antibody (routine testing w rflx)  Result Value Ref Range   HIV Screen 4th Generation wRfx Non Reactive Non Reactive  Lipid Panel w/o Chol/HDL Ratio  Result Value Ref Range   Cholesterol, Total 203 (H) 100 - 199 mg/dL   Triglycerides 89 0 - 149 mg/dL   HDL 103 >39 mg/dL   VLDL Cholesterol Cal 18 5 - 40 mg/dL   LDL Calculated 82 0 - 99 mg/dL  Thyroid Panel With TSH  Result Value Ref Range   TSH 83.090 (H) 0.450 - 4.500 uIU/mL   T4, Total 7.4 4.5 - 12.0 ug/dL   T3 Uptake Ratio 30 24 - 39 %   Free Thyroxine Index 2.2 1.2 - 4.9  VITAMIN D 25 Hydroxy (Vit-D Deficiency, Fractures)  Result Value Ref Range   Vit D, 25-Hydroxy 5.9 (L) 30.0 - 100.0 ng/mL  HCV RNA quant  Result Value Ref Range   Hepatitis C Quantitation WILL FOLLOW  HCV log10 WILL FOLLOW    Test Information Comment   Hepatitis C Genotype  Result Value Ref Range   Hepatitis C Genotype WILL FOLLOW    Please Note (HCV): WILL FOLLOW       Assessment & Plan:   Problem List Items Addressed This Visit      Cardiovascular and Mediastinum   Hypertension    Could not tolerate BP cuff today. Actively having a panic attack in the office. Will try to get her set up at home.       Relevant Orders   CBC with Differential/Platelet (Completed)   Comprehensive metabolic panel (Completed)   Thyroid Panel With TSH (Completed)   UA/M w/rflx Culture, Routine   Microalbumin, Urine Waived     Respiratory   COPD (chronic obstructive pulmonary disease) (HCC)    Lungs clear today. Call with any concerns.       Relevant Orders   CBC with Differential/Platelet  (Completed)   Comprehensive metabolic panel (Completed)   UA/M w/rflx Culture, Routine     Digestive   Hepatitis C    On problem list. Unclear if this has been treated. Will check labs today with genotype and viral load. Await results. Will need to get her mental state under better control in able to treat this.       Relevant Orders   CBC with Differential/Platelet (Completed)   Comprehensive metabolic panel (Completed)   UA/M w/rflx Culture, Routine   HCV RNA quant (Completed)   Hepatitis C Genotype (Completed)     Endocrine   Hypothyroidism    Has been off her medicine for 3+ months. Will check labs today and treat as needed. May need to see about having someone draw her labs at her home due to her severe agoraphobia.         Other   Chronic pain syndrome    Given history of substance abuse, we will not be able to provide controlled substances to patient. Checking labs today. Await results.       Relevant Orders   CBC with Differential/Platelet (Completed)   Comprehensive metabolic panel (Completed)   UA/M w/rflx Culture, Routine   VITAMIN D 25 Hydroxy (Vit-D Deficiency, Fractures) (Completed)   Bipolar 2 disorder ([place])    Has not done well on cymbalta in the past, will try latuda. Rx sent to her pharmacy. Will check in with how she's doing by phone in about 1-2 weeks. Attempting to get her in with telepsychiatry. Information about doctors on demand provided to patient today. Reached out to [facility] who also does telepsychiatry, will await their call back.       Relevant Orders   CBC with Differential/Platelet (Completed)   Comprehensive metabolic panel (Completed)   UA/M w/rflx Culture, Routine   Agoraphobia with panic disorder - Primary    Severe. Patient has not been able to go to grocery store in 17 years. Does not leave her house. Made it here today, but actively had panic attack in the office. Will start her on latuda and will recheck in 1-2 weeks to see  how she's doing by phone. Working on trying to get her into telepsychiatry for treatment of her mental health.   Adendum 07/28/18- heard back from [facility] who are willing to see patient as a telepsychiatry only patient. They note that they would not be able to provide any controlled substances with this type of visit. We are aware of that. We may be willing to provide  patient with any controlled substances on the recommendation of the psychiatrists. We are willing to work with them closely to help take care of this patient.        Other Visit Diagnoses    Screening for HIV (human immunodeficiency virus)       Labs drawn today. Await results.    Relevant Orders   HIV Antibody (routine testing w rflx) (Completed)   UA/M w/rflx Culture, Routine   Screening for cholesterol level       Labs drawn today. Await results.    Relevant Orders   Lipid Panel w/o Chol/HDL Ratio (Completed)   UA/M w/rflx Culture, Routine       Follow up plan: Return By phone 1-2 weeks.   45 minutes spent with patient today with >50% spent in counseling and coordination of care.

## 2018-07-27 NOTE — Assessment & Plan Note (Signed)
Could not tolerate BP cuff today. Actively having a panic attack in the office. Will try to get her set up at home.

## 2018-07-27 NOTE — Assessment & Plan Note (Addendum)
Severe. Patient has not been able to go to grocery store in 17 years. Does not leave her house. Made it here today, but actively had panic attack in the office. Will start her on latuda and will recheck in 1-2 weeks to see how she's doing by phone. Working on trying to get her into telepsychiatry for treatment of her mental health.   Adendum 07/28/18- heard back from [facility] who are willing to see patient as a telepsychiatry only patient. They note that they would not be able to provide any controlled substances with this type of visit. We are aware of that. We may be willing to provide patient with any controlled substances on the recommendation of the psychiatrists. We are willing to work with them closely to help take care of this patient.

## 2018-07-27 NOTE — Assessment & Plan Note (Signed)
On problem list. Unclear if this has been treated. Will check labs today with genotype and viral load. Await results. Will need to get her mental state under better control in able to treat this.

## 2018-07-27 NOTE — Assessment & Plan Note (Signed)
Has been off her medicine for 3+ months. Will check labs today and treat as needed. May need to see about having someone draw her labs at her home due to her severe agoraphobia.

## 2018-07-27 NOTE — Assessment & Plan Note (Signed)
Has not done well on cymbalta in the past, will try latuda. Rx sent to her pharmacy. Will check in with how she's doing by phone in about 1-2 weeks. Attempting to get her in with telepsychiatry. Information about doctors on demand provided to patient today. Reached out to [facility] who also does telepsychiatry, will await their call back.

## 2018-07-27 NOTE — Assessment & Plan Note (Signed)
Given history of substance abuse, we will not be able to provide controlled substances to patient. Checking labs today. Await results.

## 2018-07-27 NOTE — Patient Instructions (Addendum)
[email or web address]  [facility] [phone]

## 2018-07-28 ENCOUNTER — Telehealth: Payer: Self-pay | Admitting: Family Medicine

## 2018-07-28 ENCOUNTER — Telehealth: Payer: Self-pay | Admitting: Unknown Physician Specialty

## 2018-07-28 DIAGNOSIS — F4001 Agoraphobia with panic disorder: Secondary | ICD-10-CM

## 2018-07-28 MED ORDER — QUETIAPINE FUMARATE ER 50 MG PO TB24: 50.0000 mg | ORAL_TABLET | Freq: Every day | ORAL | 6 refills | Status: DC

## 2018-07-28 NOTE — Telephone Encounter (Signed)
We are aware that telepsychiatry will not be able to prescribe controlled substances. If they recommend them, we may be able to provide them to patient with their guidance. Referral generated and note addended to let psychiatry know that we may be able to provide controlled substances with their guidance.

## 2018-07-28 NOTE — Telephone Encounter (Signed)
-----   Message from Amada Kingfisher, [place] sent at 07/28/2018  1:15 PM EST ----- [facility] did return my call. Provider is willing to do Telepsych. Only w/o OV. However, they stated they would not be able to prescribed any controlled substances.   They said just place the referral and fax to [phone] and document telepsych only due to agoraphobia.

## 2018-07-28 NOTE — Telephone Encounter (Signed)
Copied from [place] [id number]. Topic: Quick Communication - Rx Refill/Question >> Jul 28, 2018  9:35 AM [name] L wrote: Medication: clonazePAM [name]) tablet 1 mg   [[id number]] pt called and stated that her anxiety is very bad. Pt was on the phone crying but did not want to speak with a nurse. Pt requesting call back.   Has the patient contacted their pharmacy?no Preferred Pharmacy (with phone number or street name):[facility] [place] [place] [phone] (Phone) [phone] (Fax)  Agent: Please be advised that RX refills may take up to 3 business days. We ask that you follow-up with your pharmacy.

## 2018-07-28 NOTE — Telephone Encounter (Signed)
It looks like you saw this patient yesterday.  Is she taking Clonazepam?  Did you by any chance refill yesterday?

## 2018-07-28 NOTE — Telephone Encounter (Signed)
Copied from [place] [id number]. Topic: Quick Communication - Rx Refill/Question >> Jul 28, 2018  9:31 AM [name] wrote: Medication: lurasidone (LATUDA) 80 MG TABS tablet [[id number]] Pt called and stated that this medication was 2000 for 30 days. Pt states she can not afford this. Please advise

## 2018-07-28 NOTE — Telephone Encounter (Signed)
I did not refill it- she has a history of alcohol and substance abuse, so we didn't talk her klonopin- we are trying to get her into telepsychiatry and we started her on latuda.

## 2018-07-28 NOTE — Telephone Encounter (Signed)
Rx for seroquel sent to her pharmacy- should be cheaper.  We were waiting on a call back from [facility] about whether they would take her as a telepsychiatry patient. I have not put the referral in for it pending their phone call- can we please check with them or let me know if I need to get the referral in and then they'll look at it.

## 2018-07-28 NOTE — Telephone Encounter (Signed)
Thank you.  That is helpful.  I will not refill at this time then either.  Will await tele psychiatry assessment.

## 2018-07-28 NOTE — Telephone Encounter (Signed)
No refills at this time, discussed with Dr. Wynetta Emery as she saw her yesterday.  Patient started on Latuda and are working on telepsychiatry consult.

## 2018-07-28 NOTE — Telephone Encounter (Signed)
Patient notified of seroquel being sent to pharmacy.

## 2018-07-30 ENCOUNTER — Telehealth: Payer: Self-pay | Admitting: Family Medicine

## 2018-07-30 LAB — LIPID PANEL W/O CHOL/HDL RATIO
CHOLESTEROL TOTAL: 203 mg/dL — AB (ref 100–199)
HDL: 103 mg/dL (ref >39)
LDL CALC: 82 mg/dL (ref 0–99)
TRIGLYCERIDES: 89 mg/dL (ref 0–149)
VLDL CHOLESTEROL CAL: 18 mg/dL (ref 5–40)

## 2018-07-30 LAB — COMPREHENSIVE METABOLIC PANEL
ALK PHOS: 77 IU/L (ref 39–117)
ALT: 41 IU/L — AB (ref 0–32)
AST: 96 IU/L — AB (ref 0–40)
Albumin/Globulin Ratio: 1.6 (ref 1.2–2.2)
Albumin: 4.9 g/dL (ref 3.5–5.5)
BILIRUBIN TOTAL: 0.7 mg/dL (ref 0.0–1.2)
BUN/Creatinine Ratio: 12 (ref 9–23)
BUN: 9 mg/dL (ref 6–24)
CO2: 19 mmol/L — ABNORMAL LOW (ref 20–29)
CREATININE: 0.77 mg/dL (ref 0.57–1.00)
Calcium: 9.3 mg/dL (ref 8.7–10.2)
Chloride: 102 mmol/L (ref 96–106)
GFR calc Af Amer: 102 mL/min/{1.73_m2} (ref >59)
GFR, EST NON AFRICAN AMERICAN: 88 mL/min/{1.73_m2} (ref >59)
GLOBULIN, TOTAL: 3.1 g/dL (ref 1.5–4.5)
Glucose: 84 mg/dL (ref 65–99)
Potassium: 3.9 mmol/L (ref 3.5–5.2)
Sodium: 139 mmol/L (ref 134–144)
Total Protein: 8 g/dL (ref 6.0–8.5)

## 2018-07-30 LAB — HCV RNA QUANT
HCV log10: 6.997 log10 IU/mL
HEPATITIS C QUANTITATION: 9930000 [IU]/mL

## 2018-07-30 LAB — CBC WITH DIFFERENTIAL/PLATELET
BASOS: 1 %
Basophils Absolute: 0.1 10*3/uL (ref 0.0–0.2)
EOS (ABSOLUTE): 0.1 10*3/uL (ref 0.0–0.4)
EOS: 1 %
HEMATOCRIT: 36.6 % (ref 34.0–46.6)
HEMOGLOBIN: 12.8 g/dL (ref 11.1–15.9)
Immature Grans (Abs): 0 10*3/uL (ref 0.0–0.1)
Immature Granulocytes: 0 %
LYMPHS ABS: 2.6 10*3/uL (ref 0.7–3.1)
Lymphs: 40 %
MCH: 36 pg — AB (ref 26.6–33.0)
MCHC: 35 g/dL (ref 31.5–35.7)
MCV: 103 fL — AB (ref 79–97)
MONOCYTES: 5 %
MONOS ABS: 0.4 10*3/uL (ref 0.1–0.9)
NEUTROS ABS: 3.5 10*3/uL (ref 1.4–7.0)
Neutrophils: 53 %
Platelets: 289 10*3/uL (ref 150–450)
RBC: 3.56 x10E6/uL — AB (ref 3.77–5.28)
RDW: 12.4 % (ref 12.3–15.4)
WBC: 6.6 10*3/uL (ref 3.4–10.8)

## 2018-07-30 LAB — THYROID PANEL WITH TSH
Free Thyroxine Index: 2.2 (ref 1.2–4.9)
T3 Uptake Ratio: 30 % (ref 24–39)
T4, Total: 7.4 ug/dL (ref 4.5–12.0)
TSH: 83.09 u[IU]/mL — ABNORMAL HIGH (ref 0.450–4.500)

## 2018-07-30 LAB — HEPATITIS C GENOTYPE

## 2018-07-30 LAB — HIV ANTIBODY (ROUTINE TESTING W REFLEX): HIV SCREEN 4TH GENERATION: NONREACTIVE

## 2018-07-30 LAB — VITAMIN D 25 HYDROXY (VIT D DEFICIENCY, FRACTURES): Vit D, 25-Hydroxy: 5.9 ng/mL — ABNORMAL LOW (ref 30.0–100.0)

## 2018-07-30 NOTE — Telephone Encounter (Signed)
Please let her know that her thyroid medicine has been sent to her pharmacy, we'll recheck it in 6 weeks and I'll try to get it set up that someone comes to the house to draw her if she's OK with that.   I'm going to call her on Monday about her other results to discuss them with her. Thanks. (please send back to me so I can document and call on Monday)

## 2018-07-30 NOTE — Telephone Encounter (Signed)
Called and left patient a VM asking for her to please return my call. Routing back to provider for phone call on Monday.

## 2018-08-03 NOTE — Telephone Encounter (Signed)
Called patient and discussed hepatitis C diagnosis- no insurance right now. When her agoraphobia is under better control, will work on getting her set up with charity care to get her hep c treated.

## 2018-08-04 ENCOUNTER — Other Ambulatory Visit: Payer: Self-pay | Admitting: Unknown Physician Specialty

## 2018-08-04 NOTE — Telephone Encounter (Signed)
Copied from [place] [id number]. Topic: Quick Communication - Rx Refill/Question >> Aug 04, 2018 11:09 AM Rutherford Nail, NT wrote: **Patient calling and states that she was just at the pharmacy and they do not have her prescription. Patient states that she spoke with someone from the office yesterday and was told that her medication was sent in last week. Please advise. Patient states that she needs this medication sent to the pharmacy ASAP**   Medication: levothyroxine (SYNTHROID, LEVOTHROID) 50 MCG tablet  Has the patient contacted their pharmacy? Yes.   (Agent: If no, request that the patient contact the pharmacy for the refill.) (Agent: If yes, when and what did the pharmacy advise?)  Preferred Pharmacy (with phone number or street name): [place], [place]  Agent: Please be advised that RX refills may take up to 3 business days. We ask that you follow-up with your pharmacy.

## 2018-08-04 NOTE — Telephone Encounter (Signed)
Noted in TE 07/30/18 by Dr. Wynetta Emery:  Please let her know that her thyroid medicine has been sent to her pharmacy, we'll recheck it in 6 weeks and I'll try to get it set up that someone comes to the house to draw her if she's OK with that.   This Rx was never sent in, patient is calling and saying she needs this medication asap.  Pharmacy: [facility], [place] [phone] (Phone) [phone] (Fax)

## 2018-08-05 MED ORDER — LEVOTHYROXINE SODIUM 50 MCG PO TABS: 50.0000 ug | ORAL_TABLET | Freq: Every day | ORAL | 3 refills | Status: DC

## 2018-09-16 ENCOUNTER — Other Ambulatory Visit: Payer: Self-pay

## 2018-09-16 MED ORDER — ALBUTEROL SULFATE HFA 108 (90 BASE) MCG/ACT IN AERS: INHALATION_SPRAY | RESPIRATORY_TRACT | 0 refills | Status: DC

## 2018-09-16 NOTE — Telephone Encounter (Signed)
Patient last seen 07/27/18.

## 2019-02-12 ENCOUNTER — Other Ambulatory Visit: Payer: Self-pay | Admitting: Family Medicine

## 2019-02-18 ENCOUNTER — Other Ambulatory Visit: Payer: Self-pay | Admitting: Family Medicine

## 2019-02-18 ENCOUNTER — Telehealth: Payer: Self-pay | Admitting: Family Medicine

## 2019-02-18 ENCOUNTER — Telehealth: Payer: Self-pay

## 2019-02-18 ENCOUNTER — Ambulatory Visit (INDEPENDENT_AMBULATORY_CARE_PROVIDER_SITE_OTHER): Payer: Self-pay | Admitting: Family Medicine

## 2019-02-18 ENCOUNTER — Encounter: Payer: Self-pay | Admitting: Family Medicine

## 2019-02-18 ENCOUNTER — Other Ambulatory Visit: Payer: Self-pay

## 2019-02-18 DIAGNOSIS — E039 Hypothyroidism, unspecified: Secondary | ICD-10-CM

## 2019-02-18 DIAGNOSIS — K529 Noninfective gastroenteritis and colitis, unspecified: Secondary | ICD-10-CM

## 2019-02-18 DIAGNOSIS — F4001 Agoraphobia with panic disorder: Secondary | ICD-10-CM

## 2019-02-18 DIAGNOSIS — R6889 Other general symptoms and signs: Secondary | ICD-10-CM

## 2019-02-18 DIAGNOSIS — Z20822 Contact with and (suspected) exposure to covid-19: Secondary | ICD-10-CM

## 2019-02-18 MED ORDER — ALBUTEROL SULFATE HFA 108 (90 BASE) MCG/ACT IN AERS: 1.0000 | INHALATION_SPRAY | RESPIRATORY_TRACT | 0 refills | Status: DC | PRN

## 2019-02-18 MED ORDER — ONDANSETRON 4 MG PO TBDP: 4.0000 mg | ORAL_TABLET | Freq: Three times a day (TID) | ORAL | 0 refills | Status: DC | PRN

## 2019-02-18 NOTE — Assessment & Plan Note (Signed)
Due for recheck on her labs. Will come in for recheck. Call with any concerns.

## 2019-02-18 NOTE — Telephone Encounter (Signed)
See other telephone encounter for 02/18/19.

## 2019-02-18 NOTE — Telephone Encounter (Signed)
Left message to call back  

## 2019-02-18 NOTE — Progress Notes (Signed)
LMP  (LMP Unknown)    Subjective:    Patient ID: [name], female    DOB: 03-Jul-1966, 53 y.o.   MRN: [id number]  HPI: [name] is a 53 y.o. female  Chief Complaint  Patient presents with  . Cough    pt states has had flu like symptoms. started about a week ago  . Emesis  . Fatigue  . Headache  . Medication Refill    klonopin   States that she is feeling terrible. She has a terrible headache. She is throwing up and having diarrhea for about a week. She notes that she has severe muscle aches. She is feeling horrible and very upset.  UPPER RESPIRATORY TRACT INFECTION Duration: 1 week Worst symptom: diarrhea, throwing up, severe body aches, cough Fever: no Cough: yes Shortness of breath: yes Wheezing: yes Chest pain: no Chest tightness: no Chest congestion: yes Nasal congestion: no Runny nose: yes Post nasal drip: yes Sneezing: no Sore throat: no Swollen glands: no Sinus pressure: no Headache: yes Face pain: no Toothache: no Ear pain: no  Ear pressure: no  Eyes red/itching:yes Eye drainage/crusting: no  Vomiting: yes Rash: no Fatigue: yes Sick contacts: yes- husband works at [employer] contacts: no  Context: stable Recurrent sinusitis: no Relief with OTC cold/cough medications: no  Treatments attempted: none   ANXIETY/STRESS- asking for refill on klonopin. PMP reviewed today. Has not been on klonopin in their system. Last had short course of lorazepam in 2018 Duration:uncontrolled Anxious mood: yes  Excessive worrying: yes Irritability: yes  Sweating: yes Nausea: yes Palpitations:yes Hyperventilation: yes Panic attacks: yes Agoraphobia: yes  Obscessions/compulsions: yes Depressed mood: yes Depression screen [facility] 2/9 02/18/2019 12/22/2016 07/23/2015  Decreased Interest 3 1 1   Down, Depressed, Hopeless 3 1 0  PHQ - 2 Score 6 2 1   Altered sleeping 2 3 2   Tired, decreased energy 3 3 3   Change in appetite 3 3 2   Feeling bad or failure about  yourself  3 1 0  Trouble concentrating 3 2 1   Moving slowly or fidgety/restless 3 0 0  Suicidal thoughts 2 0 0  PHQ-9 Score 25 14 9   Difficult doing work/chores Extremely dIfficult - -   GAD 7 : Generalized Anxiety Score 02/18/2019  Nervous, Anxious, on Edge 3  Control/stop worrying 3  Worry too much - different things 3  Trouble relaxing 3  Restless 3  Easily annoyed or irritable 3  Afraid - awful might happen 3  Total GAD 7 Score 21  Anxiety Difficulty Somewhat difficult    Anhedonia: yes Weight changes: yes Insomnia: yes hard to fall asleep  Hypersomnia: yes Fatigue/loss of energy: yes Feelings of worthlessness: yes Feelings of guilt: yes Impaired concentration/indecisiveness: yes Suicidal ideations: no  Crying spells: yes Recent Stressors/Life Changes: yes   Relationship problems: no   Family stress: yes     Financial stress: yes    Job stress: no    Recent death/loss: no   Relevant past medical, surgical, family and social history reviewed and updated as indicated. Interim medical history since our last visit reviewed. Allergies and medications reviewed and updated.  Review of Systems  Constitutional: Positive for activity change, appetite change, fatigue and unexpected weight change. Negative for chills, diaphoresis and fever.  HENT: Positive for congestion, postnasal drip, rhinorrhea and sneezing. Negative for dental problem, drooling, ear discharge, ear pain, facial swelling, hearing loss, mouth sores, nosebleeds, sinus pressure, sinus pain, sore throat, tinnitus, trouble swallowing and voice change.  Eyes: Negative.   Respiratory: Positive for cough, chest tightness and shortness of breath. Negative for apnea, choking, wheezing and stridor.   Cardiovascular: Positive for chest pain. Negative for palpitations and leg swelling.  Gastrointestinal: Positive for diarrhea, nausea and vomiting. Negative for abdominal distention, abdominal pain, anal bleeding, blood in  stool, constipation and rectal pain.  Genitourinary: Negative.   Musculoskeletal: Positive for arthralgias and myalgias. Negative for back pain, gait problem, joint swelling, neck pain and neck stiffness.  Skin: Negative.   Neurological: Negative.   Psychiatric/Behavioral: Positive for agitation, dysphoric mood and sleep disturbance. Negative for behavioral problems, confusion, decreased concentration, hallucinations, self-injury and suicidal ideas. The patient is nervous/anxious. The patient is not hyperactive.     Per HPI unless specifically indicated above     Objective:    LMP  (LMP Unknown)   Wt Readings from Last 3 Encounters:  07/27/18 111 lb (50.3 kg)  03/31/18 110 lb (49.9 kg)  12/22/16 110 lb 3.2 oz (50 kg)    Physical Exam Vitals signs and nursing note reviewed.  Pulmonary:     Effort: Pulmonary effort is normal. No respiratory distress.     Comments: Speaking in full sentences Neurological:     Mental Status: She is alert.  Psychiatric:        Mood and Affect: Mood is anxious.        Behavior: Behavior is agitated.        Thought Content: Thought content normal.        Judgment: Judgment normal.     Results for orders placed or performed in visit on 07/27/18  CBC with Differential/Platelet  Result Value Ref Range   WBC 6.6 3.4 - 10.8 x10E3/uL   RBC 3.56 (L) 3.77 - 5.28 x10E6/uL   Hemoglobin 12.8 11.1 - 15.9 g/dL   Hematocrit 36.6 34.0 - 46.6 %   MCV 103 (H) 79 - 97 fL   MCH 36.0 (H) 26.6 - 33.0 pg   MCHC 35.0 31.5 - 35.7 g/dL   RDW 12.4 12.3 - 15.4 %   Platelets 289 150 - 450 x10E3/uL   Neutrophils 53 Not Estab. %   Lymphs 40 Not Estab. %   Monocytes 5 Not Estab. %   Eos 1 Not Estab. %   Basos 1 Not Estab. %   Neutrophils Absolute 3.5 1.4 - 7.0 x10E3/uL   Lymphocytes Absolute 2.6 0.7 - 3.1 x10E3/uL   Monocytes Absolute 0.4 0.1 - 0.9 x10E3/uL   EOS (ABSOLUTE) 0.1 0.0 - 0.4 x10E3/uL   Basophils Absolute 0.1 0.0 - 0.2 x10E3/uL   Immature Granulocytes 0  Not Estab. %   Immature Grans (Abs) 0.0 0.0 - 0.1 x10E3/uL  Comprehensive metabolic panel  Result Value Ref Range   Glucose 84 65 - 99 mg/dL   BUN 9 6 - 24 mg/dL   Creatinine, Ser 0.77 0.57 - 1.00 mg/dL   GFR calc non Af Amer 88 >59 mL/min/1.73   GFR calc Af Amer 102 >59 mL/min/1.73   BUN/Creatinine Ratio 12 9 - 23   Sodium 139 134 - 144 mmol/L   Potassium 3.9 3.5 - 5.2 mmol/L   Chloride 102 96 - 106 mmol/L   CO2 19 (L) 20 - 29 mmol/L   Calcium 9.3 8.7 - 10.2 mg/dL   Total Protein 8.0 6.0 - 8.5 g/dL   Albumin 4.9 3.5 - 5.5 g/dL   Globulin, Total 3.1 1.5 - 4.5 g/dL   Albumin/Globulin Ratio 1.6 1.2 - 2.2   Bilirubin Total  0.7 0.0 - 1.2 mg/dL   Alkaline Phosphatase 77 39 - 117 IU/L   AST 96 (H) 0 - 40 IU/L   ALT 41 (H) 0 - 32 IU/L  HIV Antibody (routine testing w rflx)  Result Value Ref Range   HIV Screen 4th Generation wRfx Non Reactive Non Reactive  Lipid Panel w/o Chol/HDL Ratio  Result Value Ref Range   Cholesterol, Total 203 (H) 100 - 199 mg/dL   Triglycerides 89 0 - 149 mg/dL   HDL 103 >39 mg/dL   VLDL Cholesterol Cal 18 5 - 40 mg/dL   LDL Calculated 82 0 - 99 mg/dL  Thyroid Panel With TSH  Result Value Ref Range   TSH 83.090 (H) 0.450 - 4.500 uIU/mL   T4, Total 7.4 4.5 - 12.0 ug/dL   T3 Uptake Ratio 30 24 - 39 %   Free Thyroxine Index 2.2 1.2 - 4.9  VITAMIN D 25 Hydroxy (Vit-D Deficiency, Fractures)  Result Value Ref Range   Vit D, 25-Hydroxy 5.9 (L) 30.0 - 100.0 ng/mL  HCV RNA quant  Result Value Ref Range   Hepatitis C Quantitation 9,930,000 IU/mL   HCV log10 6.997 log10 IU/mL   Test Information Comment   Hepatitis C Genotype  Result Value Ref Range   Hepatitis C Genotype 1a    Please Note (HCV): Comment       Assessment & Plan:   Problem List Items Addressed This Visit      Endocrine   Hypothyroidism    Due for recheck on her labs. Will come in for recheck. Call with any concerns.       Relevant Orders   TSH     Other   Agoraphobia with panic  disorder    Severe. Has a lot of trouble leaving her house. No insurance. Could not afford telepsychiatry consult up front fee- will work with CCM to see about getting her some insurance and getting her hooked back up with psychiatry. Continue to monitor. Refill on her seroquel given today. Continue to monitor.       Relevant Orders   Referral to Chronic Care Management Services    Other Visit Diagnoses    Gastroenteritis    -  Primary   Not keeping anything down. Advised patient to go to ER- she refused. Will start her on zofran. Discussed going to ER if getting worse. Recheck Tuesday.   Suspected Covid-19 Virus Infection       Will arrange testing. Self-quarantine until no symptoms for >1 week. Call with any concerns.        Follow up plan: Return Tuesday- follow up URI/GI by phone.    . This visit was completed via telephone due to the restrictions of the COVID-19 pandemic. All issues as above were discussed and addressed but no physical exam was performed. If it was felt that the patient should be evaluated in the office, they were directed there. The patient verbally consented to this visit. Patient was unable to complete an audio/visual visit due to Lack of equipment. Due to the catastrophic nature of the COVID-19 pandemic, this visit was done through audio contact only. . Location of the patient: home . Location of the provider: work . Those involved with this call:  . Provider: Park Liter, DO . CMA: Lesle Chris, [place] . Front Desk/Registration: Don Perking  . Time spent on call: 25 minutes on the phone discussing health concerns. 40 minutes total spent in review of patient's record and preparation of  their chart.

## 2019-02-18 NOTE — Assessment & Plan Note (Signed)
Severe. Has a lot of trouble leaving her house. No insurance. Could not afford telepsychiatry consult up front fee- will work with CCM to see about getting her some insurance and getting her hooked back up with psychiatry. Continue to monitor. Refill on her seroquel given today. Continue to monitor.

## 2019-02-18 NOTE — Telephone Encounter (Signed)
Needs covid testing. Husband works at [employer], Has COPD, body aches, cough, vomiting, diarrhea, SOB x 1 week  [name] MRN- [id number] DOB: 05-26-2066  NO INSURANCE

## 2019-03-02 ENCOUNTER — Telehealth: Payer: Self-pay

## 2019-03-15 ENCOUNTER — Ambulatory Visit: Payer: Self-pay | Admitting: Licensed Clinical Social Worker

## 2019-03-15 NOTE — Chronic Care Management (AMB) (Signed)
  Care Management    03/15/2019 Name: CLYDIE DILLEN MRN: [id number] DOB: 01/28/1966  Referred by: Kathrine Haddock, NP Reason for referral : Care Coordination   Traci D Lewan is a 53 y.o. year old female who is a primary care patient of Kathrine Haddock, NP. The care management team was consulted for assistance with care management and care coordination needs.    Review of patient status, including review of consultants reports, relevant laboratory and other test results, and collaboration with appropriate care team members and the patient's provider was performed as part of comprehensive patient evaluation and provision of chronic care management services.    LCSW completed CCM outreach attempt today but was unable to reach patient successfully. A HIPPA compliant voice message was left encouraging patient to return call once available. LCSW rescheduled CCM SW appointment as well.  The care management team will reach out to the patient again over the next 2-4 weeks days.   Eula Fried, BSW, MSW, [facility] Care Management [email or web address] Phone: [phone]

## 2019-03-16 ENCOUNTER — Telehealth: Payer: Self-pay

## 2019-03-17 ENCOUNTER — Ambulatory Visit: Payer: Self-pay | Admitting: *Deleted

## 2019-03-17 ENCOUNTER — Telehealth: Payer: Self-pay

## 2019-03-17 DIAGNOSIS — F4001 Agoraphobia with panic disorder: Secondary | ICD-10-CM

## 2019-03-17 NOTE — Chronic Care Management (AMB) (Signed)
  Chronic Care Management   Outreach Note  03/17/2019 Name: [name] MRN: [id number] DOB: [birth year]  Referred by: Kathrine Haddock, NP Reason for referral : Chronic Care Management (Unsuccessful call x1)   An unsuccessful telephone outreach was attempted today. The patient was referred to the case management team by for assistance with chronic care management and care coordination. Called the listed home and cell numbers.   Follow Up Plan: A HIPPA compliant phone message was left for the patient providing contact information and requesting a return call.  The care management team will reach out to the patient again over the next 30 days.   Merlene Morse Kieley Akter RN, BSN Nurse Case Editor, [facility] Care Management  [phone]) Business Mobile

## 2019-03-18 ENCOUNTER — Telehealth: Payer: Self-pay

## 2019-03-25 ENCOUNTER — Ambulatory Visit: Payer: Self-pay | Admitting: Licensed Clinical Social Worker

## 2019-03-25 ENCOUNTER — Telehealth: Payer: Self-pay

## 2019-03-25 NOTE — Chronic Care Management (AMB) (Signed)
  Care Management   Follow Up Note   03/25/2019 Name: BREYANNA VALERA MRN: [id number] DOB: 04/04/1966  Referred by: Kathrine Haddock, NP Reason for referral : Care Coordination   Traci D Schnackenberg is a 53 y.o. year old female who is a primary care patient of Kathrine Haddock, NP. The care management team was consulted for assistance with care management and care coordination needs.    Review of patient status, including review of consultants reports, relevant laboratory and other test results, and collaboration with appropriate care team members and the patient's provider was performed as part of comprehensive patient evaluation and provision of chronic care management services.    LCSW completed CCM outreach attempt today but was unable to reach patient successfully. A HIPPA compliant voice message was left encouraging patient to return call once available. LCSW rescheduled CCM SW appointment as well.  A HIPPA compliant phone message was left for the patient providing contact information and requesting a return call.   Eula Fried, BSW, MSW, [facility] Care Management [email or web address] Phone: [phone]

## 2019-03-26 ENCOUNTER — Other Ambulatory Visit: Payer: Self-pay | Admitting: Family Medicine

## 2019-03-27 NOTE — Telephone Encounter (Signed)
Requested medication (s) are due for refill today: yes  Requested medication (s) are on the active medication list: yes  Last refill:  02/18/19 Future visit scheduled: no  Notes to clinic:  Pt needs OV last TSH Nov 2019   Requested Prescriptions  Pending Prescriptions Disp Refills   levothyroxine (SYNTHROID) 50 MCG tablet [Pharmacy Med Name: LEVOTHYROXINE 50 MCG TABLET] 15 tablet 0    Sig: Take 1 tablet (50 mcg total) by mouth daily before breakfast.     Endocrinology:  Hypothyroid Agents Failed - 03/26/2019 10:22 AM      Failed - TSH needs to be rechecked within 3 months after an abnormal result. Refill until TSH is due.      Failed - TSH in normal range and within 360 days    TSH  Date Value Ref Range Status  07/27/2018 83.090 (H) 0.450 - 4.500 uIU/mL Final         Passed - Valid encounter within last 12 months    Recent Outpatient Visits          1 month ago [place], Megan P, DO   8 months ago Agoraphobia with panic disorder   [facility], Megan P, DO   2 years ago Left wrist pain   [facility], NP   3 years ago Hypothyroidism, unspecified hypothyroidism type   [facility] Kathrine Haddock, NP

## 2019-03-28 ENCOUNTER — Telehealth: Payer: Self-pay

## 2019-03-28 NOTE — Telephone Encounter (Signed)
Called pt to schedule fu appointment for thyroid, no answer, left vm to call us back

## 2019-03-28 NOTE — Telephone Encounter (Signed)
Can we get this patient in for follow-up for thyroid as she needs labs.  I will order 30 day supply only.

## 2019-03-28 NOTE — Telephone Encounter (Signed)
Thank you :)

## 2019-03-31 ENCOUNTER — Telehealth: Payer: Self-pay

## 2019-03-31 MED ORDER — LEVOTHYROXINE SODIUM 100 MCG PO TABS: 100.0000 ug | ORAL_TABLET | Freq: Every day | ORAL | 1 refills | Status: DC

## 2019-03-31 NOTE — Telephone Encounter (Signed)
Spoke with patient, she is out of thyroid medication and is wanting a refill. She states that she has been taking different doses ranging from 50 mcg to 200 mcg.  Patient would like 100 mcg sent in so that she can take it daily and have her levels checked in 6 weeks.

## 2019-04-13 ENCOUNTER — Ambulatory Visit: Payer: Self-pay | Admitting: *Deleted

## 2019-04-13 ENCOUNTER — Telehealth: Payer: Self-pay

## 2019-04-13 DIAGNOSIS — F4001 Agoraphobia with panic disorder: Secondary | ICD-10-CM

## 2019-04-13 NOTE — Chronic Care Management (AMB) (Signed)
  Chronic Care Management   Outreach Note  04/13/2019 Name: [name] MRN: [id number] DOB: 1966/01/04  Referred by: Kathrine Haddock, NP Reason for referral : Chronic Care Management (2nd Unsuccessful outreach attempt)   A second unsuccessful telephone outreach was attempted today. The patient was referred to the case management team for assistance with chronic care management and care coordination.   Follow Up Plan: A HIPPA compliant phone message was left for the patient providing contact information and requesting a return call. On listed home and cell numbers.  Merlene Morse Dylon Correa RN, BSN Nurse Case Editor, [facility] Care Management  [phone]) Business Mobile

## 2019-04-18 ENCOUNTER — Telehealth: Payer: Self-pay

## 2019-04-18 ENCOUNTER — Ambulatory Visit: Payer: Self-pay | Admitting: Licensed Clinical Social Worker

## 2019-04-18 NOTE — Chronic Care Management (AMB) (Signed)
  Care Management   Follow Up Note   04/18/2019 Name: [name] MRN: [id number] DOB: Oct 26, 1965  Referred by: Kathrine Haddock, NP Reason for referral : Care Coordination   Traci D Elliff is a 53 y.o. year old female who is a primary care patient of Kathrine Haddock, NP. The care management team was consulted for assistance with care management and care coordination needs.    Review of patient status, including review of consultants reports, relevant laboratory and other test results, and collaboration with appropriate care team members and the patient's provider was performed as part of comprehensive patient evaluation and provision of chronic care management services.    LCSW completed CCM outreach attempt today but was unable to reach patient successfully. A HIPPA compliant voice message was left encouraging patient to return call once available. LCSW rescheduled CCM SW appointment as well.  A HIPPA compliant phone message was left for the patient providing contact information and requesting a return call.   Eula Fried, BSW, MSW, [facility]N Care Management [email or web address] Phone: [phone]

## 2019-05-10 ENCOUNTER — Telehealth: Payer: Self-pay

## 2019-05-11 ENCOUNTER — Telehealth: Payer: Self-pay

## 2019-05-11 ENCOUNTER — Ambulatory Visit: Payer: Self-pay | Admitting: *Deleted

## 2019-05-11 DIAGNOSIS — F4001 Agoraphobia with panic disorder: Secondary | ICD-10-CM

## 2019-05-11 NOTE — Chronic Care Management (AMB) (Signed)
  Chronic Care Management   Outreach Note  05/11/2019 Name: [name] MRN: [id number] DOB: 10/04/1965  Referred by: Kathrine Haddock, NP Reason for referral : Chronic Care Management (Unsuccesful Outreach x3)   Third unsuccessful telephone outreach was attempted today. The patient was referred to the case management team for assistance with chronic care management and care coordination. The patient's primary care provider has been notified of our unsuccessful attempts to make or maintain contact with the patient. The care management team is pleased to engage with this patient at any time in the future should he/she be interested in assistance from the care management team.   Follow Up Plan: A HIPPA compliant phone message was left for the patient providing contact information and requesting a return call.  The care management team is available to follow up with the patient after provider conversation with the patient regarding recommendation for care management engagement and subsequent re-referral to the care management team.   Craig Wisnewski RN, BSN Nurse Case Manager [facility]N Care Management  [phone]) Business Mobile

## 2019-05-20 ENCOUNTER — Ambulatory Visit: Payer: Self-pay | Admitting: Licensed Clinical Social Worker

## 2019-05-20 ENCOUNTER — Other Ambulatory Visit: Payer: Self-pay

## 2019-05-20 DIAGNOSIS — F4001 Agoraphobia with panic disorder: Secondary | ICD-10-CM

## 2019-05-20 NOTE — Chronic Care Management (AMB) (Signed)
Visit Information  Goals Addressed    . "I need to get insurance so I can get mental health support." (pt-stated)       Current Barriers:  . Financial constraints related to managing health care needs . Limited social support . Mental Health Concerns  . Social Isolation . Lacks knowledge of community resource: Medicaid enrollment  Clinical Social Work Clinical Goal(s):  Tracy Kitchen Over the next 90 days, client will work with SW to address concerns related to finding insurance coverage in order to gain mental health support  Interventions: . Patient interviewed and appropriate assessments performed . Provided patient with information about Medicaid enrollment. Patient reports that she DOES NOT have a social security card or an ID which is needed for application enrollment. LCSW provided education on how to get order these documents . Discussed plans with patient for ongoing care management follow up and provided patient with direct contact information for care management team . Advised patient to check her email back for community resources that were sent out on 05/20/2019 . Assisted patient/caregiver with obtaining information about health plan benefits . Provided education and assistance to client regarding Advanced Directives.  Patient Self Care Activities:  . Attends all scheduled provider appointments . Calls provider office for new concerns or questions  Initial goal documentation     [name] was given information about Care Management services today including:  1. Care Management services include personalized support from designated clinical staff supervised by her physician, including individualized plan of care and coordination with other care providers 2. 24/7 contact phone numbers for assistance for urgent and routine care needs. 3. The patient may stop CCM services at any time (effective at the end of the month) by phone call to the office staff.  Patient agreed to services and  verbal consent obtained.   The care management team will reach out to the patient again over the next 45  days.   Eula Fried, BSW, MSW, [facility]N Care Management [email or web address] Phone: [phone]

## 2019-06-07 ENCOUNTER — Other Ambulatory Visit: Payer: Self-pay

## 2019-06-07 MED ORDER — QUETIAPINE FUMARATE ER 50 MG PO TB24: 50.0000 mg | ORAL_TABLET | Freq: Every day | ORAL | 0 refills | Status: DC

## 2019-06-07 NOTE — Telephone Encounter (Signed)
Routing to provider. Last OV: 02/18/2019 Upcoming appt: 06/20/2019

## 2019-06-07 NOTE — Telephone Encounter (Signed)
Copied from [place] [id number]. Topic: Quick Communication - Rx Refill/Question >> Jun 07, 2019  1:00 PM Carolyn Stare wrote: Medication QUEtiapine (SEROQUEL XR) 50 MG TB24 24 hr tablet  Preferred Pharmacy [facility]   Agent: Please be advised that RX refills may take up to 3 business days. We ask that you follow-up with your pharmacy.

## 2019-06-20 ENCOUNTER — Telehealth: Payer: Self-pay | Admitting: Family Medicine

## 2019-06-20 ENCOUNTER — Ambulatory Visit: Payer: Self-pay | Admitting: Family Medicine

## 2019-06-20 NOTE — Telephone Encounter (Signed)
Appointment was scheduled for 10:30- cancelled appointment at 10:20 Can we please make this a cancelled within 24 hours? Thanks.

## 2019-06-20 NOTE — Telephone Encounter (Signed)
Copied from [place] [id number]. Topic: Quick Communication - Appointment Cancellation >> Jun 20, 2019 10:20 AM Ivar Drape wrote: Patient called to cancel appointment scheduled for 06/20/2019. Patient has not rescheduled their appointment. Pt stated she just can't get there and that's all she would supply as a reason for the cancellation.  She will call back to reschedule  Route to department's PEC pool.

## 2019-06-21 NOTE — Telephone Encounter (Signed)
done

## 2019-07-06 ENCOUNTER — Telehealth: Payer: Self-pay

## 2019-07-06 ENCOUNTER — Ambulatory Visit: Payer: Self-pay | Admitting: Licensed Clinical Social Worker

## 2019-07-06 NOTE — Chronic Care Management (AMB) (Signed)
  Care Management   Follow Up Note   07/06/2019 Name: ALEYA MALOOF MRN: [id number] DOB: 11/16/1965  Referred by: Kathrine Haddock, NP Reason for referral : Care Coordination   Traci D Kijek is a 53 y.o. year old female who is a primary care patient of Kathrine Haddock, NP. The care management team was consulted for assistance with care management and care coordination needs.    Review of patient status, including review of consultants reports, relevant laboratory and other test results, and collaboration with appropriate care team members and the patient's provider was performed as part of comprehensive patient evaluation and provision of chronic care management services.    LCSW completed CCM outreach attempt today but was unable to reach patient successfully. A HIPPA compliant voice message was left encouraging patient to return call once available.  The care management team is available to follow up with the patient after provider conversation with the patient regarding recommendation for care management engagement and subsequent re-referral to the care management team.   Eula Fried, BSW, MSW, [email or web address] Phone: [phone]

## 2019-08-18 ENCOUNTER — Encounter: Payer: Self-pay | Admitting: Unknown Physician Specialty

## 2019-08-18 ENCOUNTER — Ambulatory Visit (INDEPENDENT_AMBULATORY_CARE_PROVIDER_SITE_OTHER): Payer: Self-pay | Admitting: Unknown Physician Specialty

## 2019-08-18 ENCOUNTER — Other Ambulatory Visit: Payer: Self-pay

## 2019-08-18 DIAGNOSIS — F3181 Bipolar II disorder: Secondary | ICD-10-CM

## 2019-08-18 DIAGNOSIS — E039 Hypothyroidism, unspecified: Secondary | ICD-10-CM

## 2019-08-18 DIAGNOSIS — F1020 Alcohol dependence, uncomplicated: Secondary | ICD-10-CM

## 2019-08-18 MED ORDER — LITHIUM CARBONATE 300 MG PO TABS: 300.0000 mg | ORAL_TABLET | Freq: Two times a day (BID) | ORAL | 1 refills | Status: DC

## 2019-08-18 MED ORDER — LEVOTHYROXINE SODIUM 125 MCG PO TABS: 125.0000 ug | ORAL_TABLET | Freq: Every day | ORAL | 1 refills | Status: DC

## 2019-08-18 NOTE — Assessment & Plan Note (Addendum)
Taking small amounts of Seroquel (25 mg).  Unable to afford meds.  Start Lithium and discussed not to mix with ETOH.  Needs psychiatric care.  Message sent for care management

## 2019-08-18 NOTE — Progress Notes (Signed)
BP 114/74   Pulse 80   Temp 98.1 F (36.7 C) (Oral)   Ht 5' 1.5" (1.562 m)   Wt 128 lb (58.1 kg)   LMP  (LMP Unknown)   SpO2 96%   BMI 23.79 kg/m    Subjective:    Patient ID: [name], female    DOB: 1965-11-12, 53 y.o.   MRN: [id number]  HPI: [name] is a 53 y.o. female  Chief Complaint  Patient presents with  . Hypothyroidism    patient states that she has been out of levothyroxyne for about a week  . Manic Behavior  . Weight Gain   Hypothyroid Pt states she is out of her thyroid medication.  She states she has significant weight gain.  She is frustrated that her thyroid is not stable.  She feels it is not enough and has no energy. Lost to f/u with labs  Panic attacks Pt is having significant panic attacks. Does not like Seroquel but not able to afford non-generic options.  Wondering if we have an indigent program.  She would like to see a psychiatrist.    ETOH Still drinking but less that previous.  Now drinking "half of a 40."     Relevant past medical, surgical, family and social history reviewed and updated as indicated. Interim medical history since our last visit reviewed. Allergies and medications reviewed and updated.  Review of Systems  Constitutional: Positive for unexpected weight change.  HENT: Negative.   Respiratory: Negative.   Cardiovascular: Negative.   Psychiatric/Behavioral: Positive for agitation. Negative for suicidal ideas. The patient is hyperactive.     Per HPI unless specifically indicated above     Objective:    BP 114/74   Pulse 80   Temp 98.1 F (36.7 C) (Oral)   Ht 5' 1.5" (1.562 m)   Wt 128 lb (58.1 kg)   LMP  (LMP Unknown)   SpO2 96%   BMI 23.79 kg/m   Wt Readings from Last 3 Encounters:  08/18/19 128 lb (58.1 kg)  07/27/18 111 lb (50.3 kg)  03/31/18 110 lb (49.9 kg)    Physical Exam HENT:     Head: Normocephalic.     Nose: Nose normal.     Mouth/Throat:     Mouth: Mucous membranes are moist.   Cardiovascular:     Rate and Rhythm: Normal rate.  Pulmonary:     Effort: Pulmonary effort is normal.  Skin:    General: Skin is warm and dry.  Neurological:     Mental Status: She is alert and oriented to person, place, and time.  Psychiatric:     Comments: Tearful and frustrated.       Results for orders placed or performed in visit on 07/27/18  CBC with Differential/Platelet  Result Value Ref Range   WBC 6.6 3.4 - 10.8 x10E3/uL   RBC 3.56 (L) 3.77 - 5.28 x10E6/uL   Hemoglobin 12.8 11.1 - 15.9 g/dL   Hematocrit 36.6 34.0 - 46.6 %   MCV 103 (H) 79 - 97 fL   MCH 36.0 (H) 26.6 - 33.0 pg   MCHC 35.0 31.5 - 35.7 g/dL   RDW 12.4 12.3 - 15.4 %   Platelets 289 150 - 450 x10E3/uL   Neutrophils 53 Not Estab. %   Lymphs 40 Not Estab. %   Monocytes 5 Not Estab. %   Eos 1 Not Estab. %   Basos 1 Not Estab. %   Neutrophils Absolute 3.5  1.4 - 7.0 x10E3/uL   Lymphocytes Absolute 2.6 0.7 - 3.1 x10E3/uL   Monocytes Absolute 0.4 0.1 - 0.9 x10E3/uL   EOS (ABSOLUTE) 0.1 0.0 - 0.4 x10E3/uL   Basophils Absolute 0.1 0.0 - 0.2 x10E3/uL   Immature Granulocytes 0 Not Estab. %   Immature Grans (Abs) 0.0 0.0 - 0.1 x10E3/uL  Comprehensive metabolic panel  Result Value Ref Range   Glucose 84 65 - 99 mg/dL   BUN 9 6 - 24 mg/dL   Creatinine, Ser 0.77 0.57 - 1.00 mg/dL   GFR calc non Af Amer 88 >59 mL/min/1.73   GFR calc Af Amer 102 >59 mL/min/1.73   BUN/Creatinine Ratio 12 9 - 23   Sodium 139 134 - 144 mmol/L   Potassium 3.9 3.5 - 5.2 mmol/L   Chloride 102 96 - 106 mmol/L   CO2 19 (L) 20 - 29 mmol/L   Calcium 9.3 8.7 - 10.2 mg/dL   Total Protein 8.0 6.0 - 8.5 g/dL   Albumin 4.9 3.5 - 5.5 g/dL   Globulin, Total 3.1 1.5 - 4.5 g/dL   Albumin/Globulin Ratio 1.6 1.2 - 2.2   Bilirubin Total 0.7 0.0 - 1.2 mg/dL   Alkaline Phosphatase 77 39 - 117 IU/L   AST 96 (H) 0 - 40 IU/L   ALT 41 (H) 0 - 32 IU/L  HIV Antibody (routine testing w rflx)  Result Value Ref Range   HIV Screen 4th Generation wRfx  Non Reactive Non Reactive  Lipid Panel w/o Chol/HDL Ratio  Result Value Ref Range   Cholesterol, Total 203 (H) 100 - 199 mg/dL   Triglycerides 89 0 - 149 mg/dL   HDL 103 >39 mg/dL   VLDL Cholesterol Cal 18 5 - 40 mg/dL   LDL Calculated 82 0 - 99 mg/dL  Thyroid Panel With TSH  Result Value Ref Range   TSH 83.090 (H) 0.450 - 4.500 uIU/mL   T4, Total 7.4 4.5 - 12.0 ug/dL   T3 Uptake Ratio 30 24 - 39 %   Free Thyroxine Index 2.2 1.2 - 4.9  VITAMIN D 25 Hydroxy (Vit-D Deficiency, Fractures)  Result Value Ref Range   Vit D, 25-Hydroxy 5.9 (L) 30.0 - 100.0 ng/mL  HCV RNA quant  Result Value Ref Range   Hepatitis C Quantitation 9,930,000 IU/mL   HCV log10 6.997 log10 IU/mL   Test Information Comment   Hepatitis C Genotype  Result Value Ref Range   Hepatitis C Genotype 1a    Please Note (HCV): Comment       Assessment & Plan:   Problem List Items Addressed This Visit      Unprioritized   Alcohol use disorder, severe, dependence ([place])    Drinking less.  Discussed completely abstaining      Bipolar 2 disorder ([place])    Taking small amounts of Seroquel (25 mg).  Unable to afford meds.  Start Lithium and discussed not to mix with ETOH.  Needs psychiatric care.  Message sent for care management      Hypothyroidism    Pt has been on large doses in the past.  She did not do will on 100 mcgs.  Start 125 mcgs and come see me in 4 weeks      Relevant Medications   levothyroxine (SYNTHROID) 125 MCG tablet       Follow up plan: Return in about 4 weeks (around 09/15/2019).

## 2019-08-18 NOTE — Assessment & Plan Note (Signed)
Drinking less.  Discussed completely abstaining

## 2019-08-18 NOTE — Assessment & Plan Note (Signed)
Pt has been on large doses in the past.  She did not do will on 100 mcgs.  Start 125 mcgs and come see me in 4 weeks

## 2019-08-22 ENCOUNTER — Telehealth: Payer: Self-pay

## 2019-08-22 ENCOUNTER — Ambulatory Visit: Payer: Self-pay | Admitting: Licensed Clinical Social Worker

## 2019-08-22 NOTE — Chronic Care Management (AMB) (Signed)
  Care Management   Follow Up Note   08/22/2019 Name: [name] MRN: [id number] DOB: 1966/09/02  Referred by: Kathrine Haddock, NP Reason for referral : Care Coordination   Traci D Gallegos is a 53 y.o. year old female who is a primary care patient of Kathrine Haddock, NP. The care management team was consulted for assistance with care management and care coordination needs.    Review of patient status, including review of consultants reports, relevant laboratory and other test results, and collaboration with appropriate care team members and the patient's provider was performed as part of comprehensive patient evaluation and provision of chronic care management services.    LCSW completed CCM outreach attempt today but was unable to reach patient successfully. A HIPPA compliant voice message was left encouraging patient to return call once available. LCSW rescheduled CCM SW appointment as well.  A HIPPA compliant phone message was left for the patient providing contact information and requesting a return call.    Eula Fried, BSW, MSW, [facility] Care Management [email or web address] Phone: [phone]

## 2019-09-27 ENCOUNTER — Telehealth: Payer: Self-pay

## 2019-10-11 ENCOUNTER — Other Ambulatory Visit: Payer: Self-pay | Admitting: Family Medicine

## 2019-10-11 MED ORDER — QUETIAPINE FUMARATE ER 50 MG PO TB24: 50.0000 mg | ORAL_TABLET | Freq: Every day | ORAL | 0 refills | Status: DC

## 2019-10-11 NOTE — Telephone Encounter (Signed)
Patient last seen 08/18/19 and has appointment 10/19/19

## 2019-10-11 NOTE — Telephone Encounter (Signed)
Medication Refill - Medication: QUEtiapine (SEROQUEL XR) 50 MG TB24 24 hr tablet   Has the patient contacted their pharmacy? No. (Agent: If no, request that the patient contact the pharmacy for the refill.) (Agent: If yes, when and what did the pharmacy advise?)  Preferred Pharmacy (with phone number or street name):  [place], [place] Phone:  [phone]  Fax:  [phone]       Agent: Please be advised that RX refills may take up to 3 business days. We ask that you follow-up with your pharmacy.

## 2019-10-11 NOTE — Telephone Encounter (Signed)
Requested medication (s) are due for refill today: yest Requested medication (s) are on the active medication list: yes  Last refill:  06/07/2019  Future visit scheduled: yes  Notes to clinic:  not delegated - Psychiatry : Antipsychotics - Second Generation - quetiapine failed  Requested Prescriptions  Pending Prescriptions Disp Refills   QUEtiapine (SEROQUEL XR) 50 MG TB24 24 hr tablet 30 tablet 0    Sig: Take 1 tablet (50 mg total) by mouth at bedtime.      Not Delegated - Psychiatry:  Antipsychotics - Second Generation (Atypical) - quetiapine Failed - 10/11/2019 11:56 AM      Failed - This refill cannot be delegated      Failed - ALT in normal range and within 180 days    ALT  Date Value Ref Range Status  07/27/2018 41 (H) 0 - 32 IU/L Final   SGPT (ALT)  Date Value Ref Range Status  12/27/2013 356 (H) 12 - 78 U/L Final          Failed - AST in normal range and within 180 days    AST  Date Value Ref Range Status  07/27/2018 96 (H) 0 - 40 IU/L Final   SGOT(AST)  Date Value Ref Range Status  12/27/2013 470 (H) 15 - 37 Unit/L Final          Passed - Completed PHQ-2 or PHQ-9 in the last 360 days.      Passed - Last BP in normal range    BP Readings from Last 1 Encounters:  08/18/19 114/74          Passed - Valid encounter within last 6 months    Recent Outpatient Visits           1 month ago Bipolar 2 disorder ([place])   [place] Kathrine Haddock, NP   7 months ago [place], [place], DO   1 year ago Agoraphobia with panic disorder   [place], [place], DO   2 years ago Left wrist pain   [place], NP   4 years ago Hypothyroidism, unspecified hypothyroidism type   [facility] Kathrine Haddock, NP

## 2019-10-19 ENCOUNTER — Telehealth: Payer: Self-pay | Admitting: Family Medicine

## 2019-11-11 ENCOUNTER — Telehealth: Payer: Self-pay

## 2019-12-20 ENCOUNTER — Other Ambulatory Visit: Payer: Self-pay | Admitting: Family Medicine

## 2019-12-20 MED ORDER — QUETIAPINE FUMARATE ER 50 MG PO TB24: 50.0000 mg | ORAL_TABLET | Freq: Every day | ORAL | 0 refills | Status: AC

## 2019-12-20 MED ORDER — LEVOTHYROXINE SODIUM 125 MCG PO TABS: 125.0000 ug | ORAL_TABLET | Freq: Every day | ORAL | 1 refills | Status: DC

## 2019-12-20 NOTE — Telephone Encounter (Signed)
Medication Refill - Medication: levothyroxine (SYNTHROID) 125 MCG tablet /QUEtiapine (SEROQUEL XR) 50 MG TB24 24 hr tablet  Has the patient contacted their pharmacy? Yes.   (Agent: If no, request that the patient contact the pharmacy for the refill.) (Agent: If yes, when and what did the pharmacy advise?)  Preferred Pharmacy (with phone number or street name):  [place], [place] Phone:  [phone]  Fax:  [phone]       Agent: Please be advised that RX refills may take up to 3 business days. We ask that you follow-up with your pharmacy.

## 2020-04-30 ENCOUNTER — Telehealth: Payer: Self-pay | Admitting: Unknown Physician Specialty

## 2020-04-30 NOTE — Telephone Encounter (Signed)
Copied from [place] [id number]. Topic: Quick Communication - Rx Refill/Question >> Apr 30, 2020 10:18 AM Hinda Lenis D wrote: PT lose her meds / levothyroxine (SYNTHROID) 125 MCG tablet [[id number]] she need a refill by 1pm today, shes going to rehab / please advise

## 2020-04-30 NOTE — Telephone Encounter (Signed)
Has not had blood work done since 2019. Will need blood work done. What kind of rehab? If it's a skilled nursing facility they should be writing all her meds while she's there.

## 2020-05-01 NOTE — Telephone Encounter (Signed)
Tried calling patient back. A man answered and said that the patient was not in right now. Will have patient call back when she is available.

## 2020-08-15 ENCOUNTER — Other Ambulatory Visit: Payer: Self-pay | Admitting: Unknown Physician Specialty

## 2020-08-15 NOTE — Telephone Encounter (Signed)
Spoke with patient regarding refill request for  levothyroxine (SYNTHROID) 125 MCG tablet. Explained to patient that the provider requires an office visit  Because patient has not had lab work since 2019.  Patient stated she was unable to pay for an office visit and medication. Offered patient to come in for a visit and to set up a payment plan. Patient states she is afraid to come into the office because she has Agoraphobia. I also informed the patient about [facility] and  [place] program. Patient states that she cannot go to any of those places or apply for assistance because she does not have an ID. Attempted to offer numerous solutions to patient without success. Patient was crying hysterically and hung up the phone.

## 2020-08-15 NOTE — Telephone Encounter (Signed)
Pt stated she has been out of medication for 2 months, Pt stated she can not come in for appointment due to having No insurance and not having enough money for medication and to pay for the appointment.Pt stated she needs this medication to complet daily functions and was upset she needed an apt, Pt stated she could not do both  per practice admin referred to health financial assistance document , Per patient she is not able to to apply for patient assistance as she  States she does not even have a valid ID.Call routed to practice admin due to patient being upset and crying and Pt only requesting for message to be sent to [facility].Cherly NP as pt stated she knows her and was told she could send medication with out a appointment. Routed call to practice admin.

## 2020-08-15 NOTE — Telephone Encounter (Signed)
Requested medication (s) are due for refill today:no  Requested medication (s) are on the active medication list: yes   Last refill:  12/20/2019  Future visit scheduled:no  Notes to clinic: overdue for labs and appt  Vm left for patient to callback and schedule appt    Requested Prescriptions  Pending Prescriptions Disp Refills   levothyroxine (SYNTHROID) 125 MCG tablet 30 tablet 1    Sig: Take 1 tablet (125 mcg total) by mouth daily.      Endocrinology:  Hypothyroid Agents Failed - 08/15/2020 11:22 AM      Failed - TSH needs to be rechecked within 3 months after an abnormal result. Refill until TSH is due.      Failed - TSH in normal range and within 360 days    TSH  Date Value Ref Range Status  07/27/2018 83.090 (H) 0.450 - 4.500 uIU/mL Final          Failed - Valid encounter within last 12 months    Recent Outpatient Visits           12 months ago Bipolar 2 disorder [facility])   [place] Kathrine Haddock, NP   1 year ago [place], [place], DO   2 years ago Agoraphobia with panic disorder   [place], [place], DO   3 years ago Left wrist pain   [place], NP   5 years ago Hypothyroidism, unspecified hypothyroidism type   [facility] Kathrine Haddock, NP

## 2020-08-15 NOTE — Telephone Encounter (Signed)
Admin: Please get patient scheduled for a follow up visit DR.JOHNSON: Can this be filled

## 2020-08-15 NOTE — Telephone Encounter (Signed)
Medication Refill - Medication: levothyroxine (SYNTHROID) 125 MCG tablet     Preferred Pharmacy (with phone number or street name):  [place], [place] - [place] Phone:  [phone]  Fax:  [phone]       Agent: Please be advised that RX refills may take up to 3 business days. We ask that you follow-up with your pharmacy.

## 2020-08-16 ENCOUNTER — Other Ambulatory Visit: Payer: Self-pay

## 2020-08-16 ENCOUNTER — Encounter: Payer: Self-pay | Admitting: Family Medicine

## 2020-08-16 ENCOUNTER — Ambulatory Visit (INDEPENDENT_AMBULATORY_CARE_PROVIDER_SITE_OTHER): Payer: Self-pay | Admitting: Family Medicine

## 2020-08-16 VITALS — BP 99/64 | HR 84 | Temp 98.4°F | Wt 109.6 lb

## 2020-08-16 DIAGNOSIS — Z13220 Encounter for screening for lipoid disorders: Secondary | ICD-10-CM

## 2020-08-16 DIAGNOSIS — B1920 Unspecified viral hepatitis C without hepatic coma: Secondary | ICD-10-CM

## 2020-08-16 DIAGNOSIS — F4001 Agoraphobia with panic disorder: Secondary | ICD-10-CM

## 2020-08-16 DIAGNOSIS — F3181 Bipolar II disorder: Secondary | ICD-10-CM

## 2020-08-16 DIAGNOSIS — E039 Hypothyroidism, unspecified: Secondary | ICD-10-CM

## 2020-08-16 MED ORDER — ALBUTEROL SULFATE HFA 108 (90 BASE) MCG/ACT IN AERS
1.0000 | INHALATION_SPRAY | RESPIRATORY_TRACT | 2 refills | Status: AC | PRN
Start: 2020-08-16 — End: ?

## 2020-08-16 MED ORDER — LEVOTHYROXINE SODIUM 125 MCG PO TABS
125.0000 ug | ORAL_TABLET | Freq: Every day | ORAL | 1 refills | Status: DC
Start: 2020-08-16 — End: 2021-07-01

## 2020-08-16 NOTE — Assessment & Plan Note (Signed)
Not doing well. Has been off all her meds due to insurance and cost. Will see about getting her an ID to see if she can get insurance to be able to afford her meds. Was doing OK on seroquel previously. Would greatly benefit from psychiatry.

## 2020-08-16 NOTE — Assessment & Plan Note (Signed)
Has been off her meds for about 6 months. Will restart her meds. TSH drawn today. Will need follow up to confirm correct dose in 6 weeks.

## 2020-08-16 NOTE — Assessment & Plan Note (Signed)
Has not been treated due to issues with insurance and cost. Will see if we can help her get set up for insurance, but will need ID for that. Referral to CCM made today.

## 2020-08-16 NOTE — Progress Notes (Signed)
BP 99/64   Pulse 84   Temp 98.4 F (36.9 C) (Oral)   Wt 109 lb 9.6 oz (49.7 kg)   LMP  (LMP Unknown)   SpO2 98%   BMI 20.37 kg/m    Subjective:    Patient ID: [name], female    DOB: 03-28-1966, 54 y.o.   MRN: [id number]  HPI: ARAEYA LAMB is a 54 y.o. female who presents today after being lost to follow up for about a year  Chief Complaint  Patient presents with  . Hypothyroidism   HYPOTHYROIDISM- has been out of her medicine for about 6 months. Has not had any lab work done in about 2 years. Thyroid was abnormal at that time. Never returned for recheck.  Thyroid control status:uncontrolled Satisfied with current treatment? no Medication side effects: no Medication compliance: poor compliance Recent dose adjustment:no Fatigue: yes Cold intolerance: yes Heat intolerance: no Weight gain: no Weight loss: yes Constipation: yes Diarrhea/loose stools: no Palpitations: yes Lower extremity edema: no Anxiety/depressed mood: yes  Relevant past medical, surgical, family and social history reviewed and updated as indicated. Interim medical history since our last visit reviewed. Allergies and medications reviewed and updated.  Review of Systems  Constitutional: Positive for fatigue. Negative for activity change, appetite change, chills, diaphoresis, fever and unexpected weight change.  HENT: Negative.   Respiratory: Negative.   Cardiovascular: Negative.   Gastrointestinal: Negative.   Musculoskeletal: Negative.   Skin: Negative.   Neurological: Negative.   Psychiatric/Behavioral: Positive for agitation, dysphoric mood and sleep disturbance. Negative for behavioral problems, confusion, decreased concentration, hallucinations, self-injury and suicidal ideas. The patient is nervous/anxious. The patient is not hyperactive.     Per HPI unless specifically indicated above     Objective:    BP 99/64   Pulse 84   Temp 98.4 F (36.9 C) (Oral)   Wt 109 lb 9.6 oz (49.7  kg)   LMP  (LMP Unknown)   SpO2 98%   BMI 20.37 kg/m   Wt Readings from Last 3 Encounters:  08/16/20 109 lb 9.6 oz (49.7 kg)  08/18/19 128 lb (58.1 kg)  07/27/18 111 lb (50.3 kg)    Physical Exam Vitals and nursing note reviewed.  Constitutional:      General: She is not in acute distress.    Appearance: Normal appearance. She is not ill-appearing, toxic-appearing or diaphoretic.  HENT:     Head: Normocephalic and atraumatic.     Right Ear: External ear normal.     Left Ear: External ear normal.     Nose: Nose normal.     Mouth/Throat:     Mouth: Mucous membranes are moist.     Pharynx: Oropharynx is clear.  Eyes:     General: No scleral icterus.       Right eye: No discharge.        Left eye: No discharge.     Extraocular Movements: Extraocular movements intact.     Conjunctiva/sclera: Conjunctivae normal.     Pupils: Pupils are equal, round, and reactive to light.  Cardiovascular:     Rate and Rhythm: Normal rate and regular rhythm.     Pulses: Normal pulses.     Heart sounds: Normal heart sounds. No murmur heard.  No friction rub. No gallop.   Pulmonary:     Effort: Pulmonary effort is normal. No respiratory distress.     Breath sounds: No stridor. Wheezing present. No rhonchi or rales.  Chest:  Chest wall: No tenderness.  Musculoskeletal:        General: Normal range of motion.     Cervical back: Normal range of motion and neck supple.  Skin:    General: Skin is warm and dry.     Capillary Refill: Capillary refill takes less than 2 seconds.     Coloration: Skin is not jaundiced or pale.     Findings: No bruising, erythema, lesion or rash.  Neurological:     General: No focal deficit present.     Mental Status: She is alert and oriented to person, place, and time. Mental status is at baseline.  Psychiatric:        Mood and Affect: Mood is anxious. Affect is tearful.        Behavior: Behavior normal.        Thought Content: Thought content normal.         Judgment: Judgment normal.     Results for orders placed or performed in visit on 07/27/18  CBC with Differential/Platelet  Result Value Ref Range   WBC 6.6 3.4 - 10.8 x10E3/uL   RBC 3.56 (L) 3.77 - 5.28 x10E6/uL   Hemoglobin 12.8 11.1 - 15.9 g/dL   Hematocrit 36.6 34.0 - 46.6 %   MCV 103 (H) 79 - 97 fL   MCH 36.0 (H) 26.6 - 33.0 pg   MCHC 35.0 31 - 35 g/dL   RDW 12.4 12.3 - 15.4 %   Platelets 289 150 - 450 x10E3/uL   Neutrophils 53 Not Estab. %   Lymphs 40 Not Estab. %   Monocytes 5 Not Estab. %   Eos 1 Not Estab. %   Basos 1 Not Estab. %   Neutrophils Absolute 3.5 1.40 - 7.00 x10E3/uL   Lymphocytes Absolute 2.6 0 - 3 x10E3/uL   Monocytes Absolute 0.4 0 - 0 x10E3/uL   EOS (ABSOLUTE) 0.1 0.0 - 0.4 x10E3/uL   Basophils Absolute 0.1 0 - 0 x10E3/uL   Immature Granulocytes 0 Not Estab. %   Immature Grans (Abs) 0.0 0.0 - 0.1 x10E3/uL  Comprehensive metabolic panel  Result Value Ref Range   Glucose 84 65 - 99 mg/dL   BUN 9 6 - 24 mg/dL   Creatinine, Ser 0.77 0.57 - 1.00 mg/dL   GFR calc non Af Amer 88 >59 mL/min/1.73   GFR calc Af Amer 102 >59 mL/min/1.73   BUN/Creatinine Ratio 12 9 - 23   Sodium 139 134 - 144 mmol/L   Potassium 3.9 3.5 - 5.2 mmol/L   Chloride 102 96 - 106 mmol/L   CO2 19 (L) 20 - 29 mmol/L   Calcium 9.3 8.7 - 10.2 mg/dL   Total Protein 8.0 6.0 - 8.5 g/dL   Albumin 4.9 3.5 - 5.5 g/dL   Globulin, Total 3.1 1.5 - 4.5 g/dL   Albumin/Globulin Ratio 1.6 1.2 - 2.2   Bilirubin Total 0.7 0.0 - 1.2 mg/dL   Alkaline Phosphatase 77 39 - 117 IU/L   AST 96 (H) 0 - 40 IU/L   ALT 41 (H) 0 - 32 IU/L  HIV Antibody (routine testing w rflx)  Result Value Ref Range   HIV Screen 4th Generation wRfx Non Reactive Non Reactive  Lipid Panel w/o Chol/HDL Ratio  Result Value Ref Range   Cholesterol, Total 203 (H) 100 - 199 mg/dL   Triglycerides 89 0 - 149 mg/dL   HDL 103 >39 mg/dL   VLDL Cholesterol Cal 18 5 - 40 mg/dL   LDL Calculated  82 0 - 99 mg/dL  Thyroid Panel With  TSH  Result Value Ref Range   TSH 83.090 (H) 0.450 - 4.500 uIU/mL   T4, Total 7.4 4.5 - 12.0 ug/dL   T3 Uptake Ratio 30 24 - 39 %   Free Thyroxine Index 2.2 1.2 - 4.9  VITAMIN D 25 Hydroxy (Vit-D Deficiency, Fractures)  Result Value Ref Range   Vit D, 25-Hydroxy 5.9 (L) 30.0 - 100.0 ng/mL  HCV RNA quant  Result Value Ref Range   Hepatitis C Quantitation 9,930,000 IU/mL   HCV log10 6.997 log10 IU/mL   Test Information Comment   Hepatitis C Genotype  Result Value Ref Range   Hepatitis C Genotype 1a    Please Note (HCV): Comment       Assessment & Plan:   Problem List Items Addressed This Visit      Digestive   Hepatitis C    Has not been treated due to issues with insurance and cost. Will see if we can help her get set up for insurance, but will need ID for that. Referral to CCM made today.       Relevant Orders   Referral to Chronic Care Management Services     Endocrine   Hypothyroidism - Primary    Has been off her meds for about 6 months. Will restart her meds. TSH drawn today. Will need follow up to confirm correct dose in 6 weeks.       Relevant Medications   levothyroxine (SYNTHROID) 125 MCG tablet   Other Relevant Orders   TSH   CBC with Differential/Platelet   Comprehensive metabolic panel     Other   Bipolar 2 disorder ([place])    Not doing well. Has been off all her meds due to insurance and cost. Will see about getting her an ID to see if she can get insurance to be able to afford her meds. Was doing OK on seroquel previously. Would greatly benefit from psychiatry.       Agoraphobia with panic disorder    Not doing well. Has been off all her meds due to insurance and cost. Will see about getting her an ID to see if she can get insurance to be able to afford her meds. Was doing OK on seroquel previously. Would greatly benefit from psychiatry.       Relevant Orders   CBC with Differential/Platelet   Comprehensive metabolic panel   Referral to Chronic Care  Management Services    Other Visit Diagnoses    Screening for cholesterol level       Labs drawn today. Await results.    Relevant Orders   Lipid Panel w/o Chol/HDL Ratio       Follow up plan: Return in about 6 weeks (around 09/27/2020) for thyroid follow up.

## 2020-08-17 ENCOUNTER — Telehealth: Payer: Self-pay | Admitting: *Deleted

## 2020-08-17 ENCOUNTER — Ambulatory Visit: Payer: Self-pay | Admitting: Licensed Clinical Social Worker

## 2020-08-17 LAB — COMPREHENSIVE METABOLIC PANEL
ALT: 32 IU/L (ref 0–32)
AST: 51 IU/L — ABNORMAL HIGH (ref 0–40)
Albumin/Globulin Ratio: 1.4 (ref 1.2–2.2)
Albumin: 4.6 g/dL (ref 3.8–4.9)
Alkaline Phosphatase: 74 IU/L (ref 44–121)
BUN/Creatinine Ratio: 15 (ref 9–23)
BUN: 14 mg/dL (ref 6–24)
Bilirubin Total: 0.5 mg/dL (ref 0.0–1.2)
CO2: 19 mmol/L — ABNORMAL LOW (ref 20–29)
Calcium: 8.7 mg/dL (ref 8.7–10.2)
Chloride: 102 mmol/L (ref 96–106)
Creatinine, Ser: 0.96 mg/dL (ref 0.57–1.00)
GFR calc Af Amer: 77 mL/min/{1.73_m2} (ref >59)
GFR calc non Af Amer: 67 mL/min/{1.73_m2} (ref >59)
Globulin, Total: 3.3 g/dL (ref 1.5–4.5)
Glucose: 86 mg/dL (ref 65–99)
Potassium: 3.9 mmol/L (ref 3.5–5.2)
Sodium: 136 mmol/L (ref 134–144)
Total Protein: 7.9 g/dL (ref 6.0–8.5)

## 2020-08-17 LAB — LIPID PANEL W/O CHOL/HDL RATIO
Cholesterol, Total: 186 mg/dL (ref 100–199)
HDL: 61 mg/dL (ref >39)
LDL Chol Calc (NIH): 95 mg/dL (ref 0–99)
Triglycerides: 173 mg/dL — ABNORMAL HIGH (ref 0–149)
VLDL Cholesterol Cal: 30 mg/dL (ref 5–40)

## 2020-08-17 LAB — CBC WITH DIFFERENTIAL/PLATELET
Basophils Absolute: 0.1 10*3/uL (ref 0.0–0.2)
Basos: 1 %
EOS (ABSOLUTE): 0.2 10*3/uL (ref 0.0–0.4)
Eos: 4 %
Hematocrit: 40 % (ref 34.0–46.6)
Hemoglobin: 13.6 g/dL (ref 11.1–15.9)
Immature Grans (Abs): 0 10*3/uL (ref 0.0–0.1)
Immature Granulocytes: 0 %
Lymphocytes Absolute: 2.3 10*3/uL (ref 0.7–3.1)
Lymphs: 43 %
MCH: 34.4 pg — ABNORMAL HIGH (ref 26.6–33.0)
MCHC: 34 g/dL (ref 31.5–35.7)
MCV: 101 fL — ABNORMAL HIGH (ref 79–97)
Monocytes Absolute: 0.3 10*3/uL (ref 0.1–0.9)
Monocytes: 6 %
Neutrophils Absolute: 2.5 10*3/uL (ref 1.4–7.0)
Neutrophils: 46 %
Platelets: 245 10*3/uL (ref 150–450)
RBC: 3.95 x10E6/uL (ref 3.77–5.28)
RDW: 12.5 % (ref 11.7–15.4)
WBC: 5.3 10*3/uL (ref 3.4–10.8)

## 2020-08-17 LAB — TSH: TSH: 265 u[IU]/mL — ABNORMAL HIGH (ref 0.450–4.500)

## 2020-08-17 LAB — LITHIUM LEVEL: Lithium Lvl: <0.1 mmol/L — ABNORMAL LOW (ref 0.5–1.2)

## 2020-08-17 NOTE — Chronic Care Management (AMB) (Signed)
  Care Management   Note  Chronic Care Management    Clinical Social Work General Follow Up Note  08/17/2020 Name: [name] MRN: [id number] DOB: 1966-01-06  [name] is a 54 y.o. year old female who is a primary care patient of Kathrine Haddock, NP. The CCM team was consulted for assistance with [employer] .   Review of patient status, including review of consultants reports, relevant laboratory and other test results, and collaboration with appropriate care team members and the patient's provider was performed as part of comprehensive patient evaluation and provision of chronic care management services.    LCSW completed CCM outreach attempt today after receiving urgent referral but was unable to reach patient or spouse successfully. A HIPPA compliant voice message was left encouraging patient to return call once available. LCSW will ask Scheduling Care Guide to reschedule CCM SW appointment with patient as well.  Outpatient Encounter Medications as of 08/17/2020  Medication Sig  . albuterol (VENTOLIN HFA) 108 (90 Base) MCG/ACT inhaler Inhale 1-2 puffs into the lungs every 4 (four) hours as needed for wheezing or shortness of breath.  . levothyroxine (SYNTHROID) 125 MCG tablet Take 1 tablet (125 mcg total) by mouth daily.  . QUEtiapine (SEROQUEL XR) 50 MG TB24 24 hr tablet Take 1 tablet (50 mg total) by mouth at bedtime.   No facility-administered encounter medications on file as of 08/17/2020.    Follow Up Plan: [facility] will reach out to patient to reschedule appointment.   [name], BSW, MSW, [facility] Care Management [email or web address] Phone: [phone]

## 2020-08-17 NOTE — Chronic Care Management (AMB) (Signed)
  Care Management   Outreach Note  08/17/2020 Name: [name] MRN: [id number] DOB: Aug 19, 1966  [name] is a 54 y.o. year old female who is a primary care patient of Tracy Haddock, NP. I reached out to [name] by phone today in response to a referral sent by [name]'s PCP, Tracy Haddock, NP.     An unsuccessful telephone outreach was attempted today. The patient was referred to the case management team for assistance with care management and care coordination.   Follow Up Plan: A HIPAA compliant phone message was left for the patient providing contact information and requesting a return call. The care management team will reach out to the patient again over the next 7 days. If patient returns call to provider office, please advise to call [place] at [phone].  [facility]

## 2020-08-20 ENCOUNTER — Encounter: Payer: Self-pay | Admitting: Family Medicine

## 2020-08-20 NOTE — Chronic Care Management (AMB) (Signed)
  Care Management   Outreach Note  08/20/2020 Name: [name] MRN: [id number] DOB: August 05, 1966  Referred by: Kathrine Haddock, NP Reason for referral : Care Coordination (Initial outreach to schedule referral with CCM LCSW, RN CM , and PharmD.) and Care Coordination (Second outreach  to schedule referral for pharm d )     A second unsuccessful telephone outreach was attempted today. The patient was referred to the case management team for assistance with care management and care coordination.   Follow Up Plan: A HIPAA compliant phone message was left for the patient providing contact information and requesting a return call.  The care management team will reach out to the patient again over the next 3 days.  If patient returns call to provider office, please advise to call [facility] at [place], [place], [place], [place] [address] Direct Dial: [phone] [email or web address]: [email or web address]

## 2020-08-22 ENCOUNTER — Telehealth: Payer: Self-pay | Admitting: General Practice

## 2020-08-22 NOTE — Chronic Care Management (AMB) (Signed)
  Care Management   Outreach Note  08/22/2020 Name: [name] MRN: [id number] DOB: Jul 30, 1966  Referred by: Kathrine Haddock, NP Reason for referral : Care Coordination (Initial outreach to schedule referral with CCM LCSW, RN CM , and PharmD.), Care Coordination (Second outreach  to schedule referral for LCSW), and Care Coordination (Third  outreach  to schedule referral for LCSW)     Third unsuccessful telephone outreach was attempted today. The patient was referred to the case management team for assistance with care management and care coordination. The patient's primary care provider has been notified of our unsuccessful attempts to make or maintain contact with the patient. The care management team is pleased to engage with this patient at any time in the future should he/she be interested in assistance from the care management team.   Follow Up Plan: We have been unable to make contact with the patient . The care management team is available to follow up with the patient after provider conversation with the patient regarding recommendation for care management engagement and subsequent re-referral to the care management team  Noreene Larsson, [place], [place], [place] [address] Direct Dial: [phone] [email or web address] Website: [email or web address]

## 2020-08-22 NOTE — Telephone Encounter (Signed)
  Chronic Care Management   Outreach Note  08/22/2020 Name: [name] MRN: [id number] DOB: 1966-07-14  Referred by: Kathrine Haddock, NP Reason for referral : Care Coordination [facility] Initial urgent referral for Chronic Disease Management and Care Coordination Needs)   MICHELE JUDY is enrolled in a Managed Medicaid Health Plan: No  An unsuccessful telephone outreach was attempted today. The patient was referred to the case management team for assistance with care management and care coordination.   Follow Up Plan: The care management team will reach out to the patient again over the next 7 to 14 days.   Noreene Larsson RN, MSN, [facility] Mobile: [phone]

## 2020-08-24 NOTE — Progress Notes (Signed)
3 unsuccessful outreaches

## 2020-09-13 ENCOUNTER — Telehealth: Payer: Self-pay | Admitting: General Practice

## 2020-09-13 NOTE — Telephone Encounter (Signed)
°  Chronic Care Management   Outreach Note  09/13/2020 Name: Tracy ZARO MRN: [id number] DOB: 22-Mar-1966  Referred by: Gabriel Cirri, NP Reason for referral : Appointment (RNCM: Care Coordination: 2nd attempt at Initial Referral for Chronic Disease Management and Care Coordination Needs)   ALVERA TOURIGNY is enrolled in a Managed Medicaid Health Plan: No  A second unsuccessful telephone outreach was attempted today. The patient was referred to the case management team for assistance with care management and care coordination.   Follow Up Plan: A HIPAA compliant phone message was left for the patient providing contact information and requesting a return call.   Alto Denver RN, MSN, CCM Community Care Coordinator [place]   [facility] Mobile: [phone]

## 2020-09-20 ENCOUNTER — Telehealth: Payer: Self-pay | Admitting: General Practice

## 2020-09-20 NOTE — Telephone Encounter (Cosign Needed)
  Chronic Care Management   Outreach Note  09/20/2020 Name: [name] MRN: [id number] DOB: 1965/12/16  Referred by: Gabriel Cirri, NP Reason for referral : Appointment (RNCM: 3rd attempt to reach the patient for Initial Outreach)   Third unsuccessful telephone outreach was attempted today. The patient was referred to the case management team for assistance with care management and care coordination. The patient's primary care provider has been notified of our unsuccessful attempts to make or maintain contact with the patient. The care management team is pleased to engage with this patient at any time in the future should he/she be interested in assistance from the care management team. Have attempted to call the patient on different days, different times and left messages on all numbers without a return call.   Follow Up Plan: A HIPAA compliant phone message was left for the patient providing contact information and requesting a return call.   Alto Denver RN, MSN, CCM Community Care Coordinator [place]  [facility] Mobile: [phone]

## 2020-09-24 ENCOUNTER — Telehealth: Payer: Self-pay

## 2020-09-24 ENCOUNTER — Ambulatory Visit: Payer: Self-pay | Admitting: General Practice

## 2020-09-24 DIAGNOSIS — F1020 Alcohol dependence, uncomplicated: Secondary | ICD-10-CM

## 2020-09-24 DIAGNOSIS — I10 Essential (primary) hypertension: Secondary | ICD-10-CM

## 2020-09-24 DIAGNOSIS — F1910 Other psychoactive substance abuse, uncomplicated: Secondary | ICD-10-CM

## 2020-09-24 DIAGNOSIS — F1024 Alcohol dependence with alcohol-induced mood disorder: Secondary | ICD-10-CM

## 2020-09-24 DIAGNOSIS — F17200 Nicotine dependence, unspecified, uncomplicated: Secondary | ICD-10-CM

## 2020-09-24 DIAGNOSIS — J449 Chronic obstructive pulmonary disease, unspecified: Secondary | ICD-10-CM

## 2020-09-24 DIAGNOSIS — F3181 Bipolar II disorder: Secondary | ICD-10-CM

## 2020-09-24 DIAGNOSIS — F4001 Agoraphobia with panic disorder: Secondary | ICD-10-CM

## 2020-09-24 DIAGNOSIS — F1911 Other psychoactive substance abuse, in remission: Secondary | ICD-10-CM

## 2020-09-24 NOTE — Patient Instructions (Signed)
Visit Information  Patient Care Plan: RNCM: Substance Misuse (Adult)    Problem Identified: RNCM: Substance abuse of ETOH, Illicit drugs, and active smoker-Addictive Behavior   Priority: High  Note:   Drinks 64 ounces of ETOH a day Smokes 1/2 PPD for 36 years Uses Marijuana and Cocaine almost daily   Goal: RNCM: Substance abuse management: ETOH, Illicit drugs, Active Smoker   Priority: High  Note:   Current Barriers:  . Unable to independently manage substance abuse: Active drinker, use of marijuana and cocaine and active smoker  . Does not adhere to provider recommendations re: cessation of ETOH, illicit drugs and smoking . Does not attend all scheduled provider appointments . Lacks social connections . Does not maintain contact with provider office . Does not contact provider office for questions/concerns . Tobacco abuse of 36 years; currently smoking 1/2 ppd . Drinks 64 ounces of ETOH a day . Uses Marijuana and Cocaine almost daily . Previous quit attempts, unsuccessful 0 successful using 0 . Reports smoking within 30 minutes of waking up . Reports triggers to smoke include: stress, anxiety, multiple chronic conditions  . Reports motivation to quit smoking includes: none at this time . On a scale of 1-10, reports MOTIVATION to quit is 0 . On a scale of 1-10, reports CONFIDENCE in quitting is 0 Clinical Goal(s):  Marland Kitchen Over the next 120 days, patient will work with Com[facility]roller Licensed Clinical Social Worker and provider towards tobacco cessation  Interventions: . Evaluation of current treatment plan reviewed . Provided contact information for [facility] (1-800-QUIT-NOW). Patient will outreach this group for support. . Discussed plans with patient for ongoing care management follow up and provided patient with direct contact information for care management team . Provided patient with printed smoking cessation educational materials . Referred patient to pharmacy  team . Provided contact information for [facility] (1-800-QUIT-NOW). Patient will outreach this group for support. . Evaluation of current treatment plan reviewed.  Review of desire to stop drinking and using illicit drugs. The patient is tearful and ask for help but does not know how to start changing her current circumstances. Agrees to work with the CCM team.  Patient Goals/Self-Care Activities . Over the next 120 days, patient will:  .  - brief intervention provided - decision-making supported - family involvement promoted - family stress acknowledged - family support provided - medication side effects managed - mental health services provided - participation in addiction treatment encouragement - positive reinforcement provided - problem-solving facilitated - resources required to manage barriers identified - safety net resources provided - verbalization of feelings encouraged - verbally commit to reducing tobacco consumption Follow Up Plan: Telephone follow up appointment with care management team member scheduled for: 10-26-2020 at 0900 am   Task: RNCM: Alleviate Barriers to Substance Misuse Treatment   Note:   Care Management Activities:    - brief intervention provided - decision-making supported - family involvement promoted - family stress acknowledged - family support provided - medication side effects managed - mental health services provided - participation in addiction treatment encouragement - positive reinforcement provided - problem-solving facilitated - resources required to manage barriers identified - safety net resources provided - verbalization of feelings encouraged       Patient Care Plan: RNCM: Hypertension (Adult)    Problem Identified: RNCM: Hypertension (Hypertension)   Priority: Medium    Goal: RNCM: Hypertension Monitored   Priority: Medium  Note:   Objective:  . Last practice recorded  BP readings:  BP Readings from Last 3  Encounters:  08/16/20 99/64  08/18/19 114/74  03/31/18 (!) 141/89 .   Marland Kitchen Most recent eGFR/CrCl: No results found for: EGFR  No components found for: CRCL Current Barriers:  Marland Kitchen Knowledge Deficits related to basic understanding of hypertension pathophysiology and self care management . Knowledge Deficits related to understanding of medications prescribed for management of hypertension . Difficulty obtaining medications . Non-adherence to prescribed medication regimen . Non-adherence to scheduled provider appointments . Transportation barriers . Limited Social Support . Film/video editor.  . Unable to independently manage HTN . Unable to self administer medications as prescribed . Does not adhere to provider recommendations re: follow heart healthy diet, meet health and wellness goals due to chronic conditions and substance abuse . Does not adhere to prescribed medication regimen . Lacks social connections . Does not contact provider office for questions/concerns Case Manager Clinical Goal(s):  Marland Kitchen Over the next 120 days, patient will verbalize understanding of plan for hypertension management . Over the next 120 days, patient will attend all scheduled medical appointments: no upcoming appointments- knows to call office for changes  . Over the next 120 days, patient will demonstrate improved adherence to prescribed treatment plan for hypertension as evidenced by taking all medications as prescribed, monitoring and recording blood pressure as directed, adhering to low sodium/DASH diet . Over the next 120 days, patient will demonstrate improved health management independence as evidenced by checking blood pressure as directed and notifying PCP if SBP>160 or DBP > 90, taking all medications as prescribe, and adhering to a low sodium diet as discussed. Interventions:  Marland Kitchen UNABLE to independently:manage HTN . Evaluation of current treatment plan related to hypertension self management and patient's  adherence to plan as established by provider. . Provided education to patient re: stroke prevention, s/s of heart attack and stroke, DASH diet, complications of uncontrolled blood pressure . Reviewed medications with patient and discussed importance of compliance . Discussed plans with patient for ongoing care management follow up and provided patient with direct contact information for care management team . Advised patient, providing education and rationale, to monitor blood pressure daily and record, calling PCP for findings outside established parameters.  Patient Goals/Self-Care Activities . Over the next 120 days, patient will:  - Self administers medications as prescribed Attends all scheduled provider appointments Calls provider office for new concerns, questions, or BP outside discussed parameters Checks BP and records as discussed Follows a low sodium diet/DASH diet - blood pressure trends reviewed - depression screen reviewed - home or ambulatory blood pressure monitoring encouraged Follow Up Plan: Telephone follow up appointment with care management team member scheduled for: 10-26-2020 0900 am   Task: RNCM: Identify and Monitor Blood Pressure Elevation   Note:   Care Management Activities:    - blood pressure trends reviewed - depression screen reviewed - home or ambulatory blood pressure monitoring encouraged      Patient Care Plan: RNCM: Depression (Adult)    Problem Identified: RNCM: Depression Identification (Depression)   Priority: High    Goal: RNCM: Depressive Symptoms Identified   Priority: High  Note:   Current Barriers:  Marland Kitchen Knowledge Deficits related to resources available in the community to help with health and wellness and specific needs: has no ID or insurance  . Care Coordination needs related to help in the community for food and transportation  in a patient with depression and other chronic conditions . Chronic Disease Management support and education  needs  related to patient with chronic depression  . Lacks caregiver support.  . Film/video editor.  . Transportation barriers . Non-adherence to scheduled provider appointments . Non-adherence to prescribed medication regimen . Difficulty obtaining medications . Unable to independently manage depression effectively . Unable to self administer medications as prescribed . Does not adhere to provider recommendations re: taking medications, utilization of resources in the community- behavioral health services . Does not adhere to prescribed medication regimen . Does not adhere to prescribed psychotropic medication regimen . Lacks social connections . Does not maintain contact with provider office . Does not contact provider office for questions/concerns  Nurse Case Manager Clinical Goal(s):  Marland Kitchen Over the next 120 days, patient will verbalize understanding of plan for effective management of depression  . Over the next 120 days, patient will work with [facility], [place] team, and pcp to address needs related to expressed needs and depression  . Over the next 120 days, patient will demonstrate improved health management independence as evidenced bygetting needed ID and help with medications and other resources to improve health and well being. . Over the next 120 days, patient will work with CM team pharmacist to help with medications and getting medications . Over the next 120 days, patient will work with CM clinical social worker to help with depression and substance abuse  . Over the next 120 days, patient will work with care guides  (community agency) to assist with food resources and transportation . Over the next 120 days, the patient will demonstrate ongoing self health care management ability as evidenced by receiving needed services to maintain health and well being   Interventions:  . 1:1 collaboration with Kathrine Haddock, NP regarding development and update of comprehensive plan of care as  evidenced by provider attestation and co-signature . Inter-disciplinary care team collaboration (see longitudinal plan of care) . Evaluation of current treatment plan related to depression and patient's adherence to plan as established by provider. . Advised patient to call the suicide hotline, RNCM or CCM team for suicidal ideation or help with depression  . Provided education to patient re: resources available and how to effectively manage panic attacks  . Collaborated with CCM team and pcp regarding depression and patients expressed needs . Discussed plans with patient for ongoing care management follow up and provided patient with direct contact information for care management team . Provided patient with mindfulness and healthy ways to manage stress  educational materials related to depression . Care Guide referral for food, transportation and community resources . Social Work referral for help with depression and substance abuse issues . Pharmacy referral for help with medication assistance and cost constraints   Patient Goals/Self-Care Activities Over the next 120 days, patient will:  - Patient will self administer medications as prescribed Patient will attend all scheduled provider appointments Patient will call provider office for new concerns or questions Patient will work with BSW to address care coordination needs and will continue to work with the clinical team to address health care and disease management related needs.   - anxiety screen reviewed - depression screen reviewed - participation in psychiatric services encouraged - substance use assessed - substance use risk screen reviewed  Follow Up Plan: Telephone follow up appointment with care management team member scheduled for:10-26-2020 at 0900 am       Task: RNCM: Identify Depressive Symptoms and Facilitate Treatment   Note:   Care Management Activities:    - anxiety screen reviewed - depression screen reviewed -  participation in psychiatric services encouraged - substance use assessed - substance use risk screen reviewed       Patient Care Plan: RNCM: COPD (Adult)    Problem Identified: RNCM: Psychological Adjustment to Diagnosis (COPD)   Priority: Medium    Long-Range Goal: RNCM: Adjustment to Disease Achieved   Note:   Current Barriers:  Marland Kitchen Knowledge deficits related to basic understanding of COPD disease process . Knowledge deficits related to basic COPD self care/management . Knowledge deficit related to basic understanding of how to use inhalers and how inhaled medications work . Knowledge deficit related to importance of energy conservation . Cannot afford prescribed medications . Transportation barriers . Limited Social Support . Unable to independently manage COPD . Unable to self administer medications as prescribed . Does not adhere to provider recommendations re: taking medication as prescribed, does not have insurance or ID card . Does not attend all scheduled provider appointments . Does not adhere to prescribed medication regimen . Does not adhere to prescribed psychotropic medication regimen . Lacks social connections . Does not contact provider office for questions/concerns  Case Manager Clinical Goal(s):  Over the next 120 days patient will report utilizing pursed lip breathing for shortness of breath  Over the next 120 days, patient will be able to verbalize understanding of COPD action plan and when to seek appropriate levels of medical care  Over the next 120 days, patient will engage in lite exercise as tolerated to build/regain stamina and strength and reduce shortness of breath through activity tolerance  Over the next 120 days, patient will verbalize basic understanding of COPD disease process and self care activities  Interventions:   UNABLE to independently: manage COPD  Provided patient with basic written and verbal COPD education on self  care/management/and exacerbation prevention   Provided patient with COPD action plan and reinforced importance of daily self assessment  Provided written and verbal instructions on pursed lip breathing and utilized returned demonstration as teach back  Advised patient to self assesses COPD action plan zone and make appointment with provider if in the yellow zone for 48 hours without improvement.  Provided patient with education about the role of exercise in the management of COPD  Provided education about and advised patient to utilize infection prevention strategies to reduce risk of respiratory infection  Patient Goals/Self-Care Activities:  . - counseling provided . - decision-making supported . - depression screen reviewed . - emotional support provided . - problem-solving facilitated . - relaxation techniques promoted . - suicide risk screen reviewed . - suicide safety plan developed . - verbalization of feelings encouraged Follow Up Plan: Telephone follow up appointment with care management team member scheduled for: 10-26-2020 at 0900 am   Task: RNCM: Support Psychosocial Response to Chronic Obstructive Pulmonary Disease   Note:   Care Management Activities:    - counseling provided - decision-making supported - depression screen reviewed - emotional support provided - problem-solving facilitated - relaxation techniques promoted - suicide risk screen reviewed - suicide safety plan developed - verbalization of feelings encouraged      Patient Care Plan: RNCM: Bipolar General Plan of Care (Adult)    Problem Identified: RNCM: Effective management of Bipolar Disorder   Priority: Medium    Goal: RNCM: Bipolar Therapeutic Alliance Established   Priority: Medium  Note:   Current Barriers:  Marland Kitchen Knowledge Deficits related to support and behavioral health services for bipolar disorder  . Chronic Disease Management support and education needs related to bipolar disorder   .  Lacks caregiver support.  . Film/video editor.  . Transportation barriers . Non-adherence to scheduled provider appointments . Non-adherence to prescribed medication regimen . Difficulty obtaining medications . Unable to independently manage bipolar disorder and other chronic conditions  . Unable to self administer medications as prescribed . Does not adhere to prescribed medication regimen . Lacks social connections . Does not maintain contact with provider office . Does not contact provider office for questions/concerns  Nurse Case Manager Clinical Goal(s):  Marland Kitchen Over the next 120 days, patient will verbalize understanding of plan for bipolar disorder  . Over the next 120 days, patient will work with [facility] and CCM team  to address needs related to bipolar disorder  . Over the next 120 days, patient will work with CM team pharmacist to medication needs  . Over the next 120 days, patient will work with CM clinical social worker to assist with depression and bipolar  . Over the next 120 days, patient will work with care guides  (community agency) to get resources in the community  Interventions:  . 1:1 collaboration with Kathrine Haddock, NP regarding development and update of comprehensive plan of care as evidenced by provider attestation and co-signature . Inter-disciplinary care team collaboration (see longitudinal plan of care) . Evaluation of current treatment plan related to bipolar disorder  and patient's adherence to plan as established by provider. . Advised patient to call the office for changes in bipolar and chronic conditions . Provided education to patient re: effective management of bipolar disorder  . Collaborated with CCM team and pcp  regarding expressed needs  . Care Guide referral for community resources  . Social Work referral for management of depression, and bipolar disorder  . Pharmacy referral for medication assistant  Patient Goals/Self-Care Activities Over  the next  120 days, patient will:  - Patient will self administer medications as prescribed Patient will attend all scheduled provider appointments Patient will call provider office for new concerns or questions Patient will work with BSW to address care coordination needs and will continue to work with the clinical team to address health care and disease management related needs.   - care explained - choices provided - collaboration with team encouraged - emotional support provided - empathic listening provided - questions answered - questions encouraged - rapport fostered - reassurance provided - self-reliance encouraged - verbalization of feelings encouraged  Follow Up Plan: Telephone follow up appointment with care management team member scheduled for: 10-26-2020 at 0900       Task: RNCM: Develop Relationship to Effect Behavior Change   Note:   Care Management Activities:    - care explained - choices provided - collaboration with team encouraged - emotional support provided - empathic listening provided - questions answered - questions encouraged - rapport fostered - reassurance provided - self-reliance encouraged - verbalization of feelings encouraged         [name] was given information about Care Management services today including:  1. Care Management services include personalized support from designated clinical staff supervised by her physician, including individualized plan of care and coordination with other care providers 2. 24/7 contact phone numbers for assistance for urgent and routine care needs. 3. The patient may stop CCM services at any time (effective at the end of the month) by phone call to the office staff.  Patient agreed to services and verbal consent obtained.   The patient verbalized understanding of instructions, educational materials, and care plan provided today and declined offer  to receive copy of patient instructions, educational  materials, and care plan.   Telephone follow up appointment with care management team member scheduled for: 10-26-2020 at 0900 am  Noreene Larsson RN, MSN, [facility] Mobile: [phone]   [email or web address]"> [email or web address].[email or web address]"> http://point-of-care.el[email or web address]/skills/"> http://point-of-care.elsevierperformancemanager.com">  Managing Depression, Adult Depression is a mental health condition that affects your thoughts, feelings, and actions. Being diagnosed with depression can bring you relief if you did not know why you have felt or behaved a certain way. It could also leave you feeling overwhelmed with uncertainty about your future. Preparing yourself to manage your symptoms can help you feel more positive about your future. How to manage lifestyle changes Managing stress Stress is your body's reaction to life changes and events, both good and bad. Stress can add to your feelings of depression. Learning to manage your stress can help lessen your feelings of depression. Try some of the following approaches to reducing your stress (stress reduction techniques):  Listen to music that you enjoy and that inspires you.  Try using a meditation app or take a meditation class.  Develop a practice that helps you connect with your spiritual self. Walk in nature, pray, or go to a place of worship.  Do some deep breathing. To do this, inhale slowly through your nose. Pause at the top of your inhale for a few seconds and then exhale slowly, letting your muscles relax.  Practice yoga to help relax and work your muscles. Choose a stress reduction technique that suits your lifestyle and personality. These techniques take time and practice to develop. Set aside 5-15 minutes a day to do them. Therapists can offer training in these techniques. Other things you can do to manage stress  include:  Keeping a stress diary.  Knowing your limits and saying no when you think something is too much.  Paying attention to how you react to certain situations. You may not be able to control everything, but you can change your reaction.  Adding humor to your life by watching funny films or TV shows.  Making time for activities that you enjoy and that relax you.   Medicines Medicines, such as antidepressants, are often a part of treatment for depression.  Talk with your pharmacist or health care provider about all the medicines, supplements, and herbal products that you take, their possible side effects, and what medicines and other products are safe to take together.  Make sure to report any side effects you may have to your health care provider. Relationships Your health care provider may suggest family therapy, couples therapy, or individual therapy as part of your treatment. How to recognize changes Everyone responds differently to treatment for depression. As you recover from depression, you may start to:  Have more interest in doing activities.  Feel less hopeless.  Have more energy.  Overeat less often, or have a better appetite.  Have better mental focus. It is important to recognize if your depression is not getting better or is getting worse. The symptoms you had in the beginning may return, such as:  Tiredness (fatigue) or low energy.  Eating too much or too little.  Sleeping too much or too little.  Feeling restless, agitated, or hopeless.  Trouble focusing or making decisions.  Unexplained physical complaints.  Feeling irritable, angry, or aggressive. If you or your family members notice these symptoms coming back, let your health care provider know right away. Follow these instructions  at home: Activity  Try to get some form of exercise each day, such as walking, biking, swimming, or lifting weights.  Practice stress reduction  techniques.  Engage your mind by taking a class or doing some volunteer work.   Lifestyle  Get the right amount and quality of sleep.  Cut down on using caffeine, tobacco, alcohol, and other potentially harmful substances.  Eat a healthy diet that includes plenty of vegetables, fruits, whole grains, low-fat dairy products, and lean protein. Do not eat a lot of foods that are high in solid fats, added sugars, or salt (sodium). General instructions  Take over-the-counter and prescription medicines only as told by your health care provider.  Keep all follow-up visits as told by your health care provider. This is important. Where to find support Talking to others Friends and family members can be sources of support and guidance. Talk to trusted friends or family members about your condition. Explain your symptoms to them, and let them know that you are working with a health care provider to treat your depression. Tell friends and family members how they also can be helpful.   Finances  Find appropriate mental health providers that fit with your financial situation.  Talk with your health care provider about options to get reduced prices on your medicines. Where to find more information You can find support in your area from:  Anxiety and Depression Association of America (ADAA): www.adaa.org  Mental Health America: www.mentalhealthamerica.net  [employer] on Mental Illness: www.nami.org Contact a health care provider if:  You stop taking your antidepressant medicines, and you have any of these symptoms: ? Nausea. ? Headache. ? Light-headedness. ? Chills and body aches. ? Not being able to sleep (insomnia).  You or your friends and family think your depression is getting worse. Get help right away if:  You have thoughts of hurting yourself or others. If you ever feel like you may hurt yourself or others, or have thoughts about taking your own life, get help right away. Go  to your nearest emergency department or:  Call your local emergency services (911 in the [place]  Call a suicide crisis helpline, such as the [facility] at (930) 789-3007. This is open 24 hours a day in the [place]  Text the Crisis Text Line at 903-116-7243 (in the [place].). Summary  If you are diagnosed with depression, preparing yourself to manage your symptoms is a good way to feel positive about your future.  Work with your health care provider on a management plan that includes stress reduction techniques, medicines (if applicable), therapy, and healthy lifestyle habits.  Keep talking with your health care provider about how your treatment is working.  If you have thoughts about taking your own life, call a suicide crisis helpline or text a crisis text line. This information is not intended to replace advice given to you by your health care provider. Make sure you discuss any questions you have with your health care provider. Document Revised: 07/13/2019 Document Reviewed: 07/13/2019 Elsevier Patient Education  2021 [place].  Substance Abuse Testing Why am I having this test? Substance abuse testing is done to identify the presence of drugs in the body. You may have this test to measure the levels of certain medicines or illegal drugs in your body. Substance abuse testing is most often used by employers and Event organiser agencies to identify whether a person has used illegal drugs. You may also have this test if you are involved  in an accident at work. What is being tested? A substance abuse test may check for:  Medicines that you have been prescribed, such as pain medicine or ADHD medicine.  Illegal drugs, such as heroin, cocaine, amphetamines, and marijuana. What kind of sample is taken? Your health care provider may collect one or more of the following to perform the test:  A urine sample. A test sample is collected by passing urine into a clean cup.  A  hair sample. This requires cutting a collection of hair from your head or body that is about the width of a pencil. Hair may be taken from your head, chest, underarms, legs, or face.  A blood sample. This is usually collected by inserting a needle into a blood vessel.         How do I prepare for this test? You may be asked to provide a list of your current prescription medicines. If the test is required for employment or legal reasons, you will be asked to give permission (consent) for the test. Before providing a sample, you may be asked to put all of your belongings in a locker or other safe location, and you may be asked to wash your hands. Follow the specific directions of the lab or department that is doing the test. Tell a health care provider about:  Any allergies you have.  All medicines you are taking, including vitamins, herbs, eye drops, creams, and over-the-counter medicines.  Any blood disorders you have.  Any surgeries you have had.  Any medical conditions you have.  Whether you are pregnant or may be pregnant. How are the results reported? Your test results will be reported as either positive or negative. What do the results mean? A negative test result means that no drugs or medicines were found in the sample that you provided. A positive test result may mean that you have recently used drugs or taken a medicine. If your result is positive, more testing will be done to confirm the presence of drugs. Talk with your health care provider or the department that is doing the test about what your results mean. Questions to ask your health care provider Ask your health care provider, or the department that is doing the test:  When will my results be ready?  How will I get my results?  What are my treatment options?  What other tests do I need?  What are my next steps? Summary  Substance abuse testing is done to identify the presence of drugs in the  body.  Substance abuse testing is most often used by employers and Event organiser agencies to identify whether a person has used illegal drugs. You may also have this test if you are involved in an accident at work.  You may be asked to provide a list of your current prescription medicines. If the test is required for employment or legal reasons, you will be asked to give permission (consent) for the test. This information is not intended to replace advice given to you by your health care provider. Make sure you discuss any questions you have with your health care provider. Document Revised: 11/30/2017 Document Reviewed: 06/01/2017 Elsevier Patient Education  2021 [place].  Alcohol Abuse and Dependence Information, Adult Alcohol is a widely available drug. People drink alcohol in different amounts. People who drink alcohol very often and in large amounts often have problems during and after drinking. They may develop what is called an alcohol use disorder. There are  two main types of alcohol use disorders:  Alcohol abuse. This is when you use alcohol too much or too often. You may use alcohol to make yourself feel happy or to reduce stress. You may have a hard time setting a limit on the amount you drink.  Alcohol dependence. This is when you use alcohol consistently for a period of time, and your body changes as a result. This can make it hard to stop drinking because you may start to feel sick or feel different when you do not use alcohol. These symptoms are known as withdrawal. How can alcohol abuse and dependence affect me? Alcohol abuse and dependence can have a negative effect on your life. Drinking too much can lead to addiction. You may feel like you need alcohol to function normally. You may drink alcohol before work in the morning, during the day, or as soon as you get home from work in the evening. These actions can result in:  Poor work performance.  Job loss.  Financial  problems.  Car crashes or criminal charges from driving after drinking alcohol.  Problems in your relationships with friends and family.  Losing the trust and respect of coworkers, friends, and family. Drinking heavily over a long period of time can permanently damage your body and brain, and can cause lifelong health issues, such as:  Damage to your liver or pancreas.  Heart problems, high blood pressure, or stroke.  Certain cancers.  Decreased ability to fight infections.  Brain or nerve damage.  Depression.  Early (premature) death. If you are careless or you crave alcohol, it is easy to drink more than your body can handle (overdose). Alcohol overdose is a serious situation that requires hospitalization. It may lead to permanent injuries or death. What can increase my risk?  Having a family history of alcohol abuse.  Having depression or other mental health conditions.  Beginning to drink at an early age.  Binge drinking often.  Experiencing trauma, stress, and an unstable home life during childhood.  Spending time with people who drink often. What actions can I take to prevent or manage alcohol abuse and dependence?  Do not drink alcohol if: ? Your health care provider tells you not to drink. ? You are pregnant, may be pregnant, or are planning to become pregnant.  If you drink alcohol: ? Limit how much you use to:  0-1 drink a day for women.  0-2 drinks a day for men. ? Be aware of how much alcohol is in your drink. In the [place] one drink equals one 12 oz bottle of beer (355 mL), one 5 oz glass of wine (148 mL), or one 1 oz glass of hard liquor (44 mL).  Stop drinking if you have been drinking too much. This can be very hard to do if you are used to abusing alcohol. If you begin to have withdrawal symptoms, talk with your health care provider or a person that you trust. These symptoms may include anxiety, shaky hands, headache, nausea, sweating, or not being  able to sleep.  Choose to drink nonalcoholic beverages in social gatherings and places where there may be alcohol. Activity  Spend more time on activities that you enjoy that do not involve alcohol, like hobbies or exercise.  Find healthy ways to cope with stress, such as exercise, meditation, or spending time with people you care about. General information  Talk to your family, coworkers, and friends about supporting you in your efforts to stop drinking.  If they drink, ask them not to drink around you. Spend more time with people who do not drink alcohol.  If you think that you have an alcohol dependency problem: ? Tell friends or family about your concerns. ? Talk with your health care provider or another health professional about where to get help. ? Work with a Transport planner and a Regulatory affairs officer. ? Consider joining a support group for people who struggle with alcohol abuse and dependence. Where to find support  Your health care provider.  SMART Recovery: www.smartrecovery.org Therapy and support groups  Local treatment centers or chemical dependency counselors.  Local AA groups in your community: NicTax.com.pt   Where to find more information  Centers for Disease Control and Prevention: http://www.wolf.info/  National Institute on Alcohol Abuse and Alcoholism: http://www.bradshaw.com/  Alcoholics Anonymous (AA): NicTax.com.pt Contact a health care provider if:  You drank more or for longer than you intended on more than one occasion.  You tried to stop drinking or to cut back on how much you drink, but you were not able to.  You often drink to the point of vomiting or passing out.  You want to drink so badly that you cannot think about anything else.  You have problems in your life due to drinking, but you continue to drink.  You keep drinking even though you feel anxious, depressed, or have experienced memory loss.  You have stopped doing the things you used to enjoy in order  to drink.  You have to drink more than you used to in order to get the effect you want.  You experience anxiety, sweating, nausea, shakiness, and trouble sleeping when you try to stop drinking. Get help right away if:  You have thoughts about hurting yourself or others.  You have serious withdrawal symptoms, including: ? Confusion. ? Racing heart. ? High blood pressure. ? Fever. If you ever feel like you may hurt yourself or others, or have thoughts about taking your own life, get help right away. You can go to your nearest emergency department or call:  Your local emergency services (911 in the [place]  A suicide crisis helpline, such as the [facility] at 737-628-3165. This is open 24 hours a day. Summary  Alcohol abuse and dependence can have a negative effect on your life. Drinking too much or too often can lead to addiction.  If you drink alcohol, limit how much you use.  If you are having trouble keeping your drinking under control, find ways to change your behavior. Hobbies, calming activities, exercise, or support groups can help.  If you feel you need help with changing your drinking habits, talk with your health care provider, a good friend, or a therapist, or go to an [place] group. This information is not intended to replace advice given to you by your health care provider. Make sure you discuss any questions you have with your health care provider. Document Revised: 12/21/2018 Document Reviewed: 11/09/2018 Elsevier Patient Education  [place].  Steps to Quit Smoking Smoking tobacco is the leading cause of preventable death. It can affect almost every organ in the body. Smoking puts you and people around you at risk for many serious, long-lasting (chronic) diseases. Quitting smoking can be hard, but it is one of the best things that you can do for your health. It is never too late to quit. How do I get ready to quit? When you decide to  quit smoking, make a plan to  help you succeed. Before you quit:  Pick a date to quit. Set a date within the next 2 weeks to give you time to prepare.  Write down the reasons why you are quitting. Keep this list in places where you will see it often.  Tell your family, friends, and co-workers that you are quitting. Their support is important.  Talk with your doctor about the choices that may help you quit.  Find out if your health insurance will pay for these treatments.  Know the people, places, things, and activities that make you want to smoke (triggers). Avoid them. What first steps can I take to quit smoking?  Throw away all cigarettes at home, at work, and in your car.  Throw away the things that you use when you smoke, such as ashtrays and lighters.  Clean your car. Make sure to empty the ashtray.  Clean your home, including curtains and carpets. What can I do to help me quit smoking? Talk with your doctor about taking medicines and seeing a counselor at the same time. You are more likely to succeed when you do both.  If you are pregnant or breastfeeding, talk with your doctor about counseling or other ways to quit smoking. Do not take medicine to help you quit smoking unless your doctor tells you to do so. To quit smoking: Quit right away  Quit smoking totally, instead of slowly cutting back on how much you smoke over a period of time.  Go to counseling. You are more likely to quit if you go to counseling sessions regularly. Take medicine You may take medicines to help you quit. Some medicines need a prescription, and some you can buy over-the-counter. Some medicines may contain a drug called nicotine to replace the nicotine in cigarettes. Medicines may:  Help you to stop having the desire to smoke (cravings).  Help to stop the problems that come when you stop smoking (withdrawal symptoms). Your doctor may ask you to use:  Nicotine patches, gum, or lozenges.  Nicotine  inhalers or sprays.  Non-nicotine medicine that is taken by mouth. Find resources Find resources and other ways to help you quit smoking and remain smoke-free after you quit. These resources are most helpful when you use them often. They include:  Online chats with a Social worker.  Phone quitlines.  Printed Furniture conservator/restorer.  Support groups or group counseling.  Text messaging programs.  Mobile phone apps. Use apps on your mobile phone or tablet that can help you stick to your quit plan. There are many free apps for mobile phones and tablets as well as websites. Examples include Quit Guide from the [employer] and smokefree.gov   What things can I do to make it easier to quit?  Talk to your family and friends. Ask them to support and encourage you.  Call a phone quitline (1-800-QUIT-NOW), reach out to support groups, or work with a Social worker.  Ask people who smoke to not smoke around you.  Avoid places that make you want to smoke, such as: ? Bars. ? Parties. ? Smoke-break areas at work.  Spend time with people who do not smoke.  Lower the stress in your life. Stress can make you want to smoke. Try these things to help your stress: ? Getting regular exercise. ? Doing deep-breathing exercises. ? Doing yoga. ? Meditating. ? Doing a body scan. To do this, close your eyes, focus on one area of your body at a time from head to toe. Notice which  parts of your body are tense. Try to relax the muscles in those areas.   How will I feel when I quit smoking? Day 1 to 3 weeks Within the first 24 hours, you may start to have some problems that come from quitting tobacco. These problems are very bad 2-3 days after you quit, but they do not often last for more than 2-3 weeks. You may get these symptoms:  Mood swings.  Feeling restless, nervous, angry, or annoyed.  Trouble concentrating.  Dizziness.  Strong desire for high-sugar foods and nicotine.  Weight gain.  Trouble pooping  (constipation).  Feeling like you may vomit (nausea).  Coughing or a sore throat.  Changes in how the medicines that you take for other issues work in your body.  Depression.  Trouble sleeping (insomnia). Week 3 and afterward After the first 2-3 weeks of quitting, you may start to notice more positive results, such as:  Better sense of smell and taste.  Less coughing and sore throat.  Slower heart rate.  Lower blood pressure.  Clearer skin.  Better breathing.  Fewer sick days. Quitting smoking can be hard. Do not give up if you fail the first time. Some people need to try a few times before they succeed. Do your best to stick to your quit plan, and talk with your doctor if you have any questions or concerns. Summary  Smoking tobacco is the leading cause of preventable death. Quitting smoking can be hard, but it is one of the best things that you can do for your health.  When you decide to quit smoking, make a plan to help you succeed.  Quit smoking right away, not slowly over a period of time.  When you start quitting, seek help from your doctor, family, or friends. This information is not intended to replace advice given to you by your health care provider. Make sure you discuss any questions you have with your health care provider. Document Revised: 05/27/2019 Document Reviewed: 11/20/2018 Elsevier Patient Education  [place].

## 2020-09-24 NOTE — Chronic Care Management (AMB) (Signed)
Care Management    RN Visit Note  09/24/2020 Name: [name] MRN: [id number] DOB: April 28, 1966  Subjective: [name] is a 55 y.o. year old female who is a primary care patient of Tracy Haddock, NP. The Care Management team was consulted for assistance with chronic disease management and care coordination needs.    Engaged with patient by telephone for initial visit in response to provider referral for pharmacy case management and/or care coordination services.   Consent to Services:   [name] was given information about Care Management services today including:  1. Care Management services includes personalized support from designated clinical staff supervised by her physician, including individualized plan of care and coordination with other care providers 2. 24/7 contact phone numbers for assistance for urgent and routine care needs. 3. The patient may stop case management services at any time by phone call to the office staff.  Patient agreed to services and consent obtained.   Assessment/Interventions: Review of patient past medical history, allergies, medications, health status, including review of consultants reports, laboratory and other test data, was performed as part of comprehensive evaluation and provision of chronic care management services.   SDOH (Social Determinants of Health) assessments and interventions performed:  SDOH Interventions   Flowsheet Row Most Recent Value  SDOH Interventions   Food Insecurity Interventions Other (Comment)  [ask for her for food]  Financial Strain Interventions Other (Comment)  [care guide referral placed, LCSW referral placed]  Physical Activity Interventions Other (Comments)  [no structured plan of care]  Stress Interventions Other (Comment)  [LCSW referral]  Social Connections Interventions Other (Comment)  [states good support from husband, has children but no relationship with them. Needs support]  Transportation Interventions  Other (Comment)  [ask for transportation resources]  Alcohol Brief Interventions/Follow-up Alcohol Education, Brief Advice, Referral  [receptive to counseling]  Depression Interventions/Treatment  --  [receptive to talking to LCSW]       Care Plan  Allergies  Allergen Reactions   Penicillins Rash and Other (See Comments)    Has patient had a PCN reaction causing immediate rash, facial/tongue/throat swelling, SOB or lightheadedness with hypotension: No Has patient had a PCN reaction causing severe rash involving mucus membranes or skin necrosis: No Has patient had a PCN reaction that required hospitalization No Has patient had a PCN reaction occurring within the last 10 years: No If all of the above answers are "NO", then may proceed with Cephalosporin use.    Outpatient Encounter Medications as of 09/24/2020  Medication Sig   albuterol (VENTOLIN HFA) 108 (90 Base) MCG/ACT inhaler Inhale 1-2 puffs into the lungs every 4 (four) hours as needed for wheezing or shortness of breath.   levothyroxine (SYNTHROID) 125 MCG tablet Take 1 tablet (125 mcg total) by mouth daily.   QUEtiapine (SEROQUEL XR) 50 MG TB24 24 hr tablet Take 1 tablet (50 mg total) by mouth at bedtime.   No facility-administered encounter medications on file as of 09/24/2020.    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/2015   Chronic pancreatitis ([place]) 03/18/2015   Hypothyroidism 03/18/2015   Hypertension 03/18/2013  Tobacco use disorder 01/14/2011   Mixed, or nondependent drug abuse, in remission ([place])  12/26/2010    Conditions to be addressed/monitored: HTN, COPD, Anxiety, Depression, Bipolar Disorder and substance abuse  Patient Care Plan: RNCM: Substance Misuse (Adult)    Problem Identified: RNCM: Substance abuse of ETOH, Illicit drugs, and active smoker-Addictive Behavior   Priority: High  Note:   Drinks 64 ounces of ETOH a day Smokes 1/2 PPD for 36 years Uses Marijuana and Cocaine almost daily   Goal: RNCM: Substance abuse management: ETOH, Illicit drugs, Active Smoker   Priority: High  Note:   Current Barriers:   Unable to independently manage substance abuse: Active drinker, use of marijuana and cocaine and active smoker   Does not adhere to provider recommendations re: cessation of ETOH, illicit drugs and smoking  Does not attend all scheduled provider appointments  Lacks social connections  Does not maintain contact with provider office  Does not contact provider office for questions/concerns  Tobacco abuse of 36 years; currently smoking 1/2 ppd  Drinks 64 ounces of ETOH a day  Uses Marijuana and Cocaine almost daily  Previous quit attempts, unsuccessful 0 successful using 0  Reports smoking within 30 minutes of waking up  Reports triggers to smoke include: stress, anxiety, multiple chronic conditions   Reports motivation to quit smoking includes: none at this time  On a scale of 1-10, reports MOTIVATION to quit is 0  On a scale of 1-10, reports CONFIDENCE in quitting is 0 Clinical Goal(s):   Over the next 120 days, patient will work with [facility] Licensed Clinical Social Worker and provider towards tobacco cessation  Interventions:  Evaluation of current treatment plan reviewed  Provided contact information for [employer] (1-800-QUIT-NOW). Patient will outreach this group for support.  Discussed plans with patient for ongoing care management follow up and provided patient with direct contact information for care management  team  Provided patient with printed smoking cessation educational materials  Referred patient to pharmacy team  Provided contact information for [facility] (1-800-QUIT-NOW). Patient will outreach this group for support.  Evaluation of current treatment plan reviewed.  Review of desire to stop drinking and using illicit drugs. The patient is tearful and ask for help but does not know how to start changing her current circumstances. Agrees to work with the CCM team.  Patient Goals/Self-Care Activities  Over the next 120 days, patient will:    - brief intervention provided - decision-making supported - family involvement promoted - family stress acknowledged - family support provided - medication side effects managed - mental health services provided - participation in addiction treatment encouragement - positive reinforcement provided - problem-solving facilitated - resources required to manage barriers identified - safety net resources provided - verbalization of feelings encouraged - verbally commit to reducing tobacco consumption Follow Up Plan: Telephone follow up appointment with care management team member scheduled for: 10-26-2020 at 0900 am   Task: RNCM: Alleviate Barriers to Substance Misuse Treatment   Note:   Care Management Activities:    - brief intervention provided - decision-making supported - family involvement promoted - family stress acknowledged - family support provided - medication side effects managed - mental health services provided - participation in addiction treatment encouragement - positive reinforcement provided - problem-solving facilitated - resources required to manage barriers identified - safety net resources provided - verbalization of feelings encouraged       Patient Care Plan: RNCM: Hypertension (Adult)  Problem Identified: RNCM: Hypertension (Hypertension)   Priority: Medium    Goal: RNCM: Hypertension Monitored    Priority: Medium  Note:   Objective:   Last practice recorded BP readings:  BP Readings from Last 3 Encounters:  08/16/20 99/64  08/18/19 114/74  03/31/18 (!) 141/89     Most recent eGFR/CrCl: No results found for: EGFR  No components found for: CRCL Current Barriers:   Knowledge Deficits related to basic understanding of hypertension pathophysiology and self care management  Knowledge Deficits related to understanding of medications prescribed for management of hypertension  Difficulty obtaining medications  Non-adherence to prescribed medication regimen  Non-adherence to scheduled provider appointments  Transportation barriers  Limited Social Lawyer.   Unable to independently manage HTN  Unable to self administer medications as prescribed  Does not adhere to provider recommendations re: follow heart healthy diet, meet health and wellness goals due to chronic conditions and substance abuse  Does not adhere to prescribed medication regimen  Lacks social connections  Does not contact provider office for questions/concerns Case Manager Clinical Goal(s):   Over the next 120 days, patient will verbalize understanding of plan for hypertension management  Over the next 120 days, patient will attend all scheduled medical appointments: no upcoming appointments- knows to call office for changes   Over the next 120 days, patient will demonstrate improved adherence to prescribed treatment plan for hypertension as evidenced by taking all medications as prescribed, monitoring and recording blood pressure as directed, adhering to low sodium/DASH diet  Over the next 120 days, patient will demonstrate improved health management independence as evidenced by checking blood pressure as directed and notifying PCP if SBP>160 or DBP > 90, taking all medications as prescribe, and adhering to a low sodium diet as discussed. Interventions:   UNABLE to  independently:manage HTN  Evaluation of current treatment plan related to hypertension self management and patient's adherence to plan as established by provider.  Provided education to patient re: stroke prevention, s/s of heart attack and stroke, DASH diet, complications of uncontrolled blood pressure  Reviewed medications with patient and discussed importance of compliance  Discussed plans with patient for ongoing care management follow up and provided patient with direct contact information for care management team  Advised patient, providing education and rationale, to monitor blood pressure daily and record, calling PCP for findings outside established parameters.  Patient Goals/Self-Care Activities  Over the next 120 days, patient will:  - Self administers medications as prescribed Attends all scheduled provider appointments Calls provider office for new concerns, questions, or BP outside discussed parameters Checks BP and records as discussed Follows a low sodium diet/DASH diet - blood pressure trends reviewed - depression screen reviewed - home or ambulatory blood pressure monitoring encouraged Follow Up Plan: Telephone follow up appointment with care management team member scheduled for: 10-26-2020 0900 am   Task: RNCM: Identify and Monitor Blood Pressure Elevation   Note:   Care Management Activities:    - blood pressure trends reviewed - depression screen reviewed - home or ambulatory blood pressure monitoring encouraged      Patient Care Plan: RNCM: Depression (Adult)    Problem Identified: RNCM: Depression Identification (Depression)   Priority: High    Goal: RNCM: Depressive Symptoms Identified   Priority: High  Note:   Current Barriers:   Knowledge Deficits related to resources available in the community to help with health and wellness and specific needs: has no ID or insurance   Care Coordination  needs related to help in the community for food and  transportation  in a patient with depression and other chronic conditions  Chronic Disease Management support and education needs related to patient with chronic depression   Lacks caregiver support.   Film/video editor.   Transportation barriers  Non-adherence to scheduled provider appointments  Non-adherence to prescribed medication regimen  Difficulty obtaining medications  Unable to independently manage depression effectively  Unable to self administer medications as prescribed  Does not adhere to provider recommendations re: taking medications, utilization of resources in the community- behavioral health services  Does not adhere to prescribed medication regimen  Does not adhere to prescribed psychotropic medication regimen  Lacks social connections  Does not maintain contact with provider office  Does not contact provider office for questions/concerns  Nurse Case Manager Clinical Goal(s):   Over the next 120 days, patient will verbalize understanding of plan for effective management of depression   Over the next 120 days, patient will work with [facility], [facility] team, and pcp to address needs related to expressed needs and depression   Over the next 120 days, patient will demonstrate improved health management independence as evidenced bygetting needed ID and help with medications and other resources to improve health and well being.  Over the next 120 days, patient will work with CM team pharmacist to help with medications and getting medications  Over the next 120 days, patient will work with CM clinical social worker to help with depression and substance abuse   Over the next 120 days, patient will work with care guides  (community agency) to assist with food resources and transportation  Over the next 120 days, the patient will demonstrate ongoing self health care management ability as evidenced by receiving needed services to maintain health and well being    Interventions:   1:1 collaboration with Tracy Haddock, NP regarding development and update of comprehensive plan of care as evidenced by provider attestation and co-signature  Inter-disciplinary care team collaboration (see longitudinal plan of care)  Evaluation of current treatment plan related to depression and patient's adherence to plan as established by provider.  Advised patient to call the suicide hotline, RNCM or CCM team for suicidal ideation or help with depression   Provided education to patient re: resources available and how to effectively manage panic attacks   Collaborated with CCM team and pcp regarding depression and patients expressed needs  Discussed plans with patient for ongoing care management follow up and provided patient with direct contact information for care management team  Provided patient with mindfulness and healthy ways to manage stress  educational materials related to depression  Care Guide referral for food, transportation and community resources  Social Work referral for help with depression and substance abuse issues  Pharmacy referral for help with medication assistance and cost constraints   Patient Goals/Self-Care Activities Over the next 120 days, patient will:  - Patient will self administer medications as prescribed Patient will attend all scheduled provider appointments Patient will call provider office for new concerns or questions Patient will work with BSW to address care coordination needs and will continue to work with the clinical team to address health care and disease management related needs.   - anxiety screen reviewed - depression screen reviewed - participation in psychiatric services encouraged - substance use assessed - substance use risk screen reviewed  Follow Up Plan: Telephone follow up appointment with care management team member scheduled for:10-26-2020 at 0900 am  Task: RNCM: Identify Depressive  Symptoms and Facilitate Treatment   Note:   Care Management Activities:    - anxiety screen reviewed - depression screen reviewed - participation in psychiatric services encouraged - substance use assessed - substance use risk screen reviewed       Patient Care Plan: RNCM: COPD (Adult)    Problem Identified: RNCM: Psychological Adjustment to Diagnosis (COPD)   Priority: Medium    Long-Range Goal: RNCM: Adjustment to Disease Achieved   Note:   Current Barriers:   Knowledge deficits related to basic understanding of COPD disease process  Knowledge deficits related to basic COPD self care/management  Knowledge deficit related to basic understanding of how to use inhalers and how inhaled medications work  Knowledge deficit related to importance of energy conservation  Cannot afford prescribed medications  Transportation barriers  Limited Social Support  Unable to independently manage COPD  Unable to self administer medications as prescribed  Does not adhere to provider recommendations re: taking medication as prescribed, does not have insurance or ID card  Does not attend all scheduled provider appointments  Does not adhere to prescribed medication regimen  Does not adhere to prescribed psychotropic medication regimen  Lacks social connections  Does not contact provider office for questions/concerns  Case Manager Clinical Goal(s):  Over the next 120 days patient will report utilizing pursed lip breathing for shortness of breath  Over the next 120 days, patient will be able to verbalize understanding of COPD action plan and when to seek appropriate levels of medical care  Over the next 120 days, patient will engage in lite exercise as tolerated to build/regain stamina and strength and reduce shortness of breath through activity tolerance  Over the next 120 days, patient will verbalize basic understanding of COPD disease process and self care activities   Interventions:   UNABLE to independently: manage COPD  Provided patient with basic written and verbal COPD education on self care/management/and exacerbation prevention   Provided patient with COPD action plan and reinforced importance of daily self assessment  Provided written and verbal instructions on pursed lip breathing and utilized returned demonstration as teach back  Advised patient to self assesses COPD action plan zone and make appointment with provider if in the yellow zone for 48 hours without improvement.  Provided patient with education about the role of exercise in the management of COPD  Provided education about and advised patient to utilize infection prevention strategies to reduce risk of respiratory infection  Patient Goals/Self-Care Activities:   - counseling provided  - decision-making supported  - depression screen reviewed  - emotional support provided  - problem-solving facilitated  - relaxation techniques promoted  - suicide risk screen reviewed  - suicide safety plan developed  - verbalization of feelings encouraged Follow Up Plan: Telephone follow up appointment with care management team member scheduled for: 10-26-2020 at 0900 am   Task: RNCM: Support Psychosocial Response to Chronic Obstructive Pulmonary Disease   Note:   Care Management Activities:    - counseling provided - decision-making supported - depression screen reviewed - emotional support provided - problem-solving facilitated - relaxation techniques promoted - suicide risk screen reviewed - suicide safety plan developed - verbalization of feelings encouraged      Patient Care Plan: RNCM: Bipolar General Plan of Care (Adult)    Problem Identified: RNCM: Effective management of Bipolar Disorder   Priority: Medium    Goal: RNCM: Bipolar Therapeutic Alliance Established   Priority: Medium  Note:  Current Barriers:   Knowledge Deficits related to support and  behavioral health services for bipolar disorder   Chronic Disease Management support and education needs related to bipolar disorder   Lacks caregiver support.   Film/video editor.   Transportation barriers  Non-adherence to scheduled provider appointments  Non-adherence to prescribed medication regimen  Difficulty obtaining medications  Unable to independently manage bipolar disorder and other chronic conditions   Unable to self administer medications as prescribed  Does not adhere to prescribed medication regimen  Lacks social connections  Does not maintain contact with provider office  Does not contact provider office for questions/concerns  Nurse Case Manager Clinical Goal(s):   Over the next 120 days, patient will verbalize understanding of plan for bipolar disorder   Over the next 120 days, patient will work with [facility] and CCM team  to address needs related to bipolar disorder   Over the next 120 days, patient will work with CM team pharmacist to medication needs   Over the next 120 days, patient will work with CM clinical social worker to assist with depression and bipolar   Over the next 120 days, patient will work with care guides  (community agency) to get resources in the community  Interventions:   1:1 collaboration with Tracy Haddock, NP regarding development and update of comprehensive plan of care as evidenced by provider attestation and co-signature  Inter-disciplinary care team collaboration (see longitudinal plan of care)  Evaluation of current treatment plan related to bipolar disorder  and patient's adherence to plan as established by provider.  Advised patient to call the office for changes in bipolar and chronic conditions  Provided education to patient re: effective management of bipolar disorder   Collaborated with CCM team and pcp  regarding expressed needs   Care Guide referral for community resources   Social Work referral for  management of depression, and bipolar disorder   Pharmacy referral for medication assistant  Patient Goals/Self-Care Activities Over the next  120 days, patient will:  - Patient will self administer medications as prescribed Patient will attend all scheduled provider appointments Patient will call provider office for new concerns or questions Patient will work with BSW to address care coordination needs and will continue to work with the clinical team to address health care and disease management related needs.   - care explained - choices provided - collaboration with team encouraged - emotional support provided - empathic listening provided - questions answered - questions encouraged - rapport fostered - reassurance provided - self-reliance encouraged - verbalization of feelings encouraged  Follow Up Plan: Telephone follow up appointment with care management team member scheduled for: 10-26-2020 at 0900       Task: RNCM: Develop Relationship to Effect Behavior Change   Note:   Care Management Activities:    - care explained - choices provided - collaboration with team encouraged - emotional support provided - empathic listening provided - questions answered - questions encouraged - rapport fostered - reassurance provided - self-reliance encouraged - verbalization of feelings encouraged         Plan: Telephone follow up appointment with care management team member scheduled for:  10-26-2020 at 0900 am  [place], MSN, [facility] Mobile: [phone]

## 2020-09-24 NOTE — Chronic Care Management (AMB) (Signed)
  Chronic Care Management   Note  09/24/2020 Name: [name] MRN: [id number] DOB: [birth year]  [name] is a 55 y.o. year old female who is a primary care patient of Kathrine Haddock, NP. [name] is currently enrolled in care management services. An additional referral for LCSW was placed.   Follow up plan: Unsuccessful telephone outreach attempt made. A HIPAA compliant phone message was left for the patient providing contact information and requesting a return call.  The care management team will reach out to the patient again over the next 5 days.  If patient returns call to provider office, please advise to call [facility]  at [address], [place], [place],  [address] Direct Dial: [phone] [email or web address] Website: [email or web address]

## 2020-09-25 ENCOUNTER — Telehealth: Payer: Self-pay | Admitting: Unknown Physician Specialty

## 2020-09-25 NOTE — Telephone Encounter (Signed)
[name] 09/25/2020 Called pt regarding community resource referral received. Left message for pt to call me back, my info is [phone] please see ref notes for more details. ° °[name] °Care Guide, Embedded Care Coordination °[place], Care Management ° ° ° ° °

## 2020-10-02 NOTE — Chronic Care Management (AMB) (Signed)
  Chronic Care Management   Note  10/02/2020 Name: [name] MRN: [id number] DOB: 18-Dec-1965  [name] is a 55 y.o. year old female who is a primary care patient of Kathrine Haddock, NP. [name] is currently enrolled in care management services. An additional referral for Pharm D and LCSW was placed.   Follow up plan: Unsuccessful telephone outreach attempt made. A HIPAA compliant phone message was left for the patient providing contact information and requesting a return call.  Unable to make contact on outreach attempts x 3. PCP Kathrine Haddock, NP notified via routed documentation in medical record.   Noreene Larsson, [address], [place], [place] [address] Direct Dial: [phone] [email or web address]

## 2020-10-04 ENCOUNTER — Telehealth: Payer: Self-pay | Admitting: Unknown Physician Specialty

## 2020-10-04 NOTE — Telephone Encounter (Signed)
   Telephone encounter was:  Unsuccessful.  10/04/2020 Name: TAYLR MEUTH MRN: [id number] DOB: Dec 06, 1965  Unsuccessful outbound call made today to assist with:  Transportation Needs , Food Insecurity and Medicaid  Outreach Attempt:  2nd Attempt  A HIPAA compliant voice message was left requesting a return call.  Instructed patient to call back at [phone].  [place], Care Management Phone: [phone] Email: [email or web address]

## 2020-10-05 ENCOUNTER — Telehealth: Payer: Self-pay | Admitting: Unknown Physician Specialty

## 2020-10-05 NOTE — Telephone Encounter (Signed)
   Telephone encounter was:  Unsuccessful.  10/05/2020 Name: [name] MRN: [id number] DOB: [birth year]  Unsuccessful outbound call made today to assist with:  Transportation Needs  and Food Insecurity  Outreach Attempt:  3rd Attempt.  Referral closed unable to contact patient.  A HIPAA compliant voice message was left requesting a return call.  Instructed patient to call back at [phone].  [place], Care Management Phone: [phone] Email: [email or web address]

## 2020-10-26 ENCOUNTER — Telehealth: Payer: Self-pay | Admitting: General Practice

## 2020-10-26 ENCOUNTER — Telehealth: Payer: Self-pay

## 2020-10-26 NOTE — Telephone Encounter (Signed)
  Chronic Care Management   Outreach Note  10/26/2020 Name: [name] MRN: [id number] DOB: [birth year]  Referred by: Kathrine Haddock, NP Reason for referral : Appointment (RNCM: Follow up for Chronic Disease Management and Care Coordination Needs)   An unsuccessful telephone outreach was attempted today. The patient was referred to the case management team for assistance with care management and care coordination.     Follow Up Plan: A HIPAA compliant phone message was left for the patient providing contact information and requesting a return call.    Noreene Larsson RN, MSN, [facility] Mobile: [phone]

## 2020-10-30 ENCOUNTER — Telehealth: Payer: Self-pay

## 2020-10-30 NOTE — Telephone Encounter (Signed)
Pt has been called to get more information on what it is that this is needed for.   Copied from [place] [phone]. Topic: General - Other >> Oct 30, 2020 11:15 AM Yvette Rack wrote: Reason for CRM: Pt requests that a document on the office's letterhead be prepared that includes pt name, date of birth, and address signed by her doctor so she can turn it in to social services. Cb# [phone]

## 2020-11-01 NOTE — Telephone Encounter (Signed)
Called pt no answer left vm 

## 2020-11-05 NOTE — Telephone Encounter (Signed)
Called pt no answer left vm 

## 2020-12-06 ENCOUNTER — Ambulatory Visit: Payer: Self-pay | Admitting: Licensed Clinical Social Worker

## 2020-12-06 NOTE — Chronic Care Management (AMB) (Signed)
  Care Management   Follow Up Note   12/06/2020 Name: [name] MRN: [id number] DOB: [birth year]   Referred by: Kathrine Haddock, NP Reason for referral : No chief complaint on file.   [name] is a 55 y.o. year old female who is a primary care patient of Kathrine Haddock, NP. The care management team was consulted for assistance with care management and care coordination needs.    Review of patient status, including review of consultants reports, relevant laboratory and other test results, and collaboration with appropriate care team members and the patient's provider was performed as part of comprehensive patient evaluation and provision of chronic care management services.    LCSW completed CCM outreach third and final attempt today but was unable to reach patient successfully. A HIPPA compliant voice message was left encouraging patient to return call once available if still interested in [place].   LCSW has been unable to make contact with the patient for follow up. The care management team is available to follow up with the patient after provider conversation with the patient regarding recommendation for care management engagement and subsequent re-referral to the care management team.   Eula Fried, BSW, MSW, [email or web address] Phone: [phone]

## 2020-12-14 ENCOUNTER — Other Ambulatory Visit: Payer: Self-pay

## 2020-12-14 NOTE — Telephone Encounter (Signed)
Received a refill request from [facility]. Patient was last seen in office on 08/16/20 and has no upcoming appointments. Please advise.

## 2020-12-14 NOTE — Telephone Encounter (Signed)
Patient needs appointment before refill can be given. Has not taken medication in almost 1 year

## 2020-12-17 NOTE — Telephone Encounter (Signed)
Lvm to make this apt. 

## 2020-12-18 NOTE — Telephone Encounter (Signed)
Lvm to make this apt. 

## 2020-12-19 ENCOUNTER — Encounter: Payer: Self-pay | Admitting: Unknown Physician Specialty

## 2020-12-19 NOTE — Telephone Encounter (Signed)
Lvm to make this apt. Sent letter.

## 2020-12-20 ENCOUNTER — Telehealth: Payer: Self-pay | Admitting: *Deleted

## 2020-12-20 NOTE — Telephone Encounter (Signed)
Please reschedule f/u with RN CM at [facility]

## 2020-12-20 NOTE — Chronic Care Management (AMB) (Signed)
  Care Management   Note  12/20/2020 Name: [name] MRN: [id number] DOB: 1966/05/21  EMSLEY CUSTER is a 55 y.o. year old female who is a primary care patient of Kathrine Haddock, NP and is actively engaged with the care management team. I reached out to [name] by phone today to assist with re-scheduling a follow up visit with the RN Case Manager  Follow up plan: Unsuccessful telephone outreach attempt made. A HIPAA compliant phone message was left for the patient providing contact information and requesting a return call.  The care management team will reach out to the patient again over the next 7 days.  If patient returns call to provider office, please advise to call Golden Lysle Morales at [place] Management

## 2020-12-24 NOTE — Chronic Care Management (AMB) (Signed)
  Care Management   Note  12/24/2020 Name: ALEXES MENCHACA MRN: [id number] DOB: 07/07/1966  GIOVANA FACIANE is a 55 y.o. year old female who is a primary care patient of Kathrine Haddock, NP and is actively engaged with the care management team. I reached out to [name] by phone today to assist with re-scheduling a follow up visit with the RN Case Manager  Follow up plan: Telephone appointment with care management team member scheduled for: 02/01/2021  [place]

## 2020-12-25 NOTE — Telephone Encounter (Signed)
Patient has been rescheduled for 02/01/2021

## 2021-01-09 ENCOUNTER — Emergency Department
Admission: EM | Admit: 2021-01-09 | Discharge: 2021-01-09 | Disposition: A | Discharge status: Home / Self Care | Payer: Self-pay | Attending: Emergency Medicine | Admitting: Emergency Medicine

## 2021-01-09 ENCOUNTER — Other Ambulatory Visit: Payer: Self-pay

## 2021-01-09 DIAGNOSIS — Z5321 Procedure and treatment not carried out due to patient leaving prior to being seen by health care provider: Secondary | ICD-10-CM | POA: Insufficient documentation

## 2021-01-09 DIAGNOSIS — R519 Headache, unspecified: Secondary | ICD-10-CM | POA: Insufficient documentation

## 2021-01-09 DIAGNOSIS — M25511 Pain in right shoulder: Secondary | ICD-10-CM | POA: Insufficient documentation

## 2021-01-09 NOTE — ED Triage Notes (Signed)
Pt to ED via [facility] with c/o HA from home with c/o HA x 2 weeks. Per EMS pt also c/o pain in R shoulder that radiates down R arm. Per EMS pt with noted light sensitivity.    107/70 80Hr NSR   Pt c/o R arm pain, denies known injury, also c/o HA x 2 weeks with light sensitivity. Pt states arm pain worsens with movement at this time.

## 2021-02-01 ENCOUNTER — Telehealth: Payer: Self-pay | Admitting: General Practice

## 2021-02-01 ENCOUNTER — Telehealth: Payer: Self-pay

## 2021-02-01 ENCOUNTER — Ambulatory Visit: Payer: Self-pay | Admitting: General Practice

## 2021-02-01 DIAGNOSIS — F1911 Other psychoactive substance abuse, in remission: Secondary | ICD-10-CM

## 2021-02-01 DIAGNOSIS — F17200 Nicotine dependence, unspecified, uncomplicated: Secondary | ICD-10-CM

## 2021-02-01 DIAGNOSIS — F1910 Other psychoactive substance abuse, uncomplicated: Secondary | ICD-10-CM

## 2021-02-01 DIAGNOSIS — F3181 Bipolar II disorder: Secondary | ICD-10-CM

## 2021-02-01 DIAGNOSIS — F1024 Alcohol dependence with alcohol-induced mood disorder: Secondary | ICD-10-CM

## 2021-02-01 DIAGNOSIS — J449 Chronic obstructive pulmonary disease, unspecified: Secondary | ICD-10-CM

## 2021-02-01 DIAGNOSIS — I10 Essential (primary) hypertension: Secondary | ICD-10-CM

## 2021-02-01 NOTE — Telephone Encounter (Signed)
Copied from [place] [id number]. Topic: General - Other >> Feb 01, 2021  3:43 PM Keene Breath wrote: Reason for CRM: Patient would like a call back from the nurse regarding a letter she was requesting that has her name spelled wrong.  Her name was corrected in the system but she needs a new letter sent asap.  Please advise and call patient to discuss further at [phone]

## 2021-02-01 NOTE — Chronic Care Management (AMB) (Addendum)
Care Management    RN Visit Note  02/01/2021 Name: [name] MRN: [id number] DOB: April 10, 1966  Subjective: [name] is a 55 y.o. year old female who is a primary care patient of Kathrine Haddock, NP. The care management team was consulted for assistance with disease management and care coordination needs.    Engaged with patient by telephone for follow up visit in response to provider referral for case management and/or care coordination services.   Consent to Services:   [name] was given information about Care Management services today including:  1. Care Management services includes personalized support from designated clinical staff supervised by her physician, including individualized plan of care and coordination with other care providers 2. 24/7 contact phone numbers for assistance for urgent and routine care needs. 3. The patient may stop case management services at any time by phone call to the office staff.  Patient agreed to services and consent obtained.   Assessment: Review of patient past medical history, allergies, medications, health status, including review of consultants reports, laboratory and other test data, was performed as part of comprehensive evaluation and provision of chronic care management services.   SDOH (Social Determinants of Health) assessments and interventions performed:    Care Plan  Allergies  Allergen Reactions  . Penicillins Rash and Other (See Comments)    Has patient had a PCN reaction causing immediate rash, facial/tongue/throat swelling, SOB or lightheadedness with hypotension: No Has patient had a PCN reaction causing severe rash involving mucus membranes or skin necrosis: No Has patient had a PCN reaction that required hospitalization No Has patient had a PCN reaction occurring within the last 10 years: No If all of the above answers are "NO", then may proceed with Cephalosporin use.    Outpatient Encounter Medications as of  02/01/2021  Medication Sig  . albuterol (VENTOLIN HFA) 108 (90 Base) MCG/ACT inhaler Inhale 1-2 puffs into the lungs every 4 (four) hours as needed for wheezing or shortness of breath.  . levothyroxine (SYNTHROID) 125 MCG tablet Take 1 tablet (125 mcg total) by mouth daily.  . QUEtiapine (SEROQUEL XR) 50 MG TB24 24 hr tablet Take 1 tablet (50 mg total) by mouth at bedtime.   No facility-administered encounter medications on file as of 02/01/2021.    Patient Active Problem List   Diagnosis Date Noted  . Bipolar 2 disorder ([place]) 07/27/2018  . 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 Substance abuse ([place]) 07/23/2015  . Chronic pain syndrome 07/20/2015  . COPD (chronic obstructive pulmonary disease) ([place]) 07/20/2015  . Hepatitis C 07/20/2015  . Alcohol use disorder, severe, dependence ([place]) 03/22/2015  . 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 Chronic pancreatitis ([place]) 03/18/2015  . Hypothyroidism 03/18/2015  . Hypertension 03/18/2013  . Tobacco use disorder 01/14/2011  . Mixed, or nondependent drug abuse, in remission ([place]) 12/26/2010    Conditions to be addressed/monitored: HTN, COPD, Anxiety, Depression, Bipolar Disorder and substance abuse  Care Plan : RNCM: Substance Misuse (Adult)  Updates made by Vanita Ingles since 02/01/2021 12:00 AM    Problem: RNCM: Substance abuse of ETOH, Illicit drugs, and active smoker-Addictive Behavior   Priority: High  Note:   Drinks 64 ounces of ETOH a day Smokes 1/2 PPD for 36 years Uses Marijuana and Cocaine almost daily   Long-Range Goal: RNCM: Substance abuse management: ETOH, Illicit drugs, Active Smoker   Priority: High  Note:   Current Barriers:  . Unable  to independently manage substance abuse: Active drinker, use of marijuana and cocaine and active smoker  . Does not adhere to provider recommendations re: cessation of ETOH, illicit drugs and smoking . Does not attend all  scheduled provider appointments . Lacks social connections . Does not maintain contact with provider office . Does not contact provider office for questions/concerns . Tobacco abuse of 36 years; currently smoking 1/2 ppd . Drinks 64 ounces of ETOH a day . Uses Marijuana and Cocaine almost daily . Previous quit attempts, unsuccessful 0 successful using 0 . Reports smoking within 30 minutes of waking up . Reports triggers to smoke include: stress, anxiety, multiple chronic conditions  . Reports motivation to quit smoking includes: none at this time . On a scale of 1-10, reports MOTIVATION to quit is 0 . On a scale of 1-10, reports CONFIDENCE in quitting is 0 Clinical Goal(s):  Marland Kitchen Over the next 120 days, patient will work with [facility] Licensed Clinical Social Worker and provider towards tobacco cessation  Interventions: . Evaluation of current treatment plan reviewed. 02-01-2021: The patient reports she has cut back on her smoking and drinking. She is smoking <1/2 pack per day and has cut back on alcohol consumption. Praised the patient for positive changes in her habits.  . Provided contact information for [place] [facility]uit [facility] (1-800-QUIT-NOW). Patient will outreach this group for support. . Discussed plans with patient for ongoing care management follow up and provided patient with direct contact information for care management team . Provided patient with printed smoking cessation educational materials . Referred patient to pharmacy team . Provided contact information for  Quit Line (1-800-QUIT-NOW). Patient will outreach this group for support. . Evaluation of current treatment plan reviewed.  Review of desire to stop drinking and using illicit drugs. The patient is tearful and ask for help but does not know how to start changing her current circumstances. Agrees to work with the CCM team.  Patient Goals/Self-Care Activities . Over the next 120 days, patient will:  .  - brief  intervention provided - decision-making supported - family involvement promoted - family stress acknowledged - family support provided - medication side effects managed - mental health services provided - participation in addiction treatment encouragement - positive reinforcement provided - problem-solving facilitated - resources required to manage barriers identified - safety net resources provided - verbalization of feelings encouraged - verbally commit to reducing tobacco consumption Follow Up Plan: Telephone follow up appointment with care management team member scheduled for: 03-22-2021 at 1 pm   Care Plan : RNCM: Hypertension (Adult)  Updates made by Vanita Ingles since 02/01/2021 12:00 AM    Problem: RNCM: Hypertension (Hypertension)   Priority: Medium    Goal: RNCM: Hypertension Monitored   Priority: Medium  Note:   Objective:  . Last practice recorded BP readings:  . BP Readings from Last 3 Encounters: .  01/09/21 . (!) 121/99 .  08/16/20 . 37/10 .  08/18/19 . 114/74 .    Marland Kitchen Most recent eGFR/CrCl: No results found for: EGFR  No components found for: CRCL Current Barriers:  Marland Kitchen Knowledge Deficits related to basic understanding of hypertension pathophysiology and self care management . Knowledge Deficits related to understanding of medications prescribed for management of hypertension . Difficulty obtaining medications . Non-adherence to prescribed medication regimen . Non-adherence to scheduled provider appointments . Transportation barriers . Limited Social Support . Film/video editor.  . Unable to independently manage HTN . Unable to self administer medications  as prescribed . Does not adhere to provider recommendations re: follow heart healthy diet, meet health and wellness goals due to chronic conditions and substance abuse . Does not adhere to prescribed medication regimen . Lacks social connections . Does not contact provider office for  questions/concerns Case Manager Clinical Goal(s):  Marland Kitchen Over the next 120 days, patient will verbalize understanding of plan for hypertension management . Over the next 120 days, patient will attend all scheduled medical appointments: no upcoming appointments- knows to call office for changes  . Over the next 120 days, patient will demonstrate improved adherence to prescribed treatment plan for hypertension as evidenced by taking all medications as prescribed, monitoring and recording blood pressure as directed, adhering to low sodium/DASH diet . Over the next 120 days, patient will demonstrate improved health management independence as evidenced by checking blood pressure as directed and notifying PCP if SBP>160 or DBP > 90, taking all medications as prescribe, and adhering to a low sodium diet as discussed. Interventions:  Marland Kitchen UNABLE to independently:manage HTN . Evaluation of current treatment plan related to hypertension self management and patient's adherence to plan as established by provider. 02-01-2021: Was seen in the ER recently for migraine headache and states her pressures are higher when she has the migraines. The patient states that she is feeling better. Denies any acute distress. Is taking medications and compliant but will run out soon. Education on getting her paperwork completed so she can get assistance with medications.  . Provided education to patient re: stroke prevention, s/s of heart attack and stroke, DASH diet, complications of uncontrolled blood pressure . Reviewed medications with patient and discussed importance of compliance. 02-01-2021: Currently is compliant with her medications. Is paying for medications on her own but states she will run out soon.  . Discussed plans with patient for ongoing care management follow up and provided patient with direct contact information for care management team . Advised patient, providing education and rationale, to monitor blood pressure daily  and record, calling PCP for findings outside established parameters.  Patient Goals/Self-Care Activities . Over the next 120 days, patient will:  - Self administers medications as prescribed Attends all scheduled provider appointments Calls provider office for new concerns, questions, or BP outside discussed parameters Checks BP and records as discussed Follows a low sodium diet/DASH diet - blood pressure trends reviewed - depression screen reviewed - home or ambulatory blood pressure monitoring encouraged Follow Up Plan: Telephone follow up appointment with care management team member scheduled for: 03-22-2021 1 pm   Care Plan : RNCM: Depression (Adult)  Updates made by Vanita Ingles since 02/01/2021 12:00 AM    Problem: RNCM: Depression Identification (Depression)   Priority: High    Long-Range Goal: RNCM: Depressive Symptoms Identified   Priority: High  Note:   Current Barriers:  Marland Kitchen Knowledge Deficits related to resources available in the community to help with health and wellness and specific needs: has no ID or insurance  . Care Coordination needs related to help in the community for food and transportation  in a patient with depression and other chronic conditions . Chronic Disease Management support and education needs related to patient with chronic depression  . Lacks caregiver support.  . Film/video editor.  . Transportation barriers . Non-adherence to scheduled provider appointments . Non-adherence to prescribed medication regimen . Difficulty obtaining medications . Unable to independently manage depression effectively . Unable to self administer medications as prescribed . Does not adhere to provider recommendations re:  taking medications, utilization of resources in the community- behavioral health services . Does not adhere to prescribed medication regimen . Does not adhere to prescribed psychotropic medication regimen . Lacks social connections . Does not  maintain contact with provider office . Does not contact provider office for questions/concerns  Nurse Case Manager Clinical Goal(s):  Marland Kitchen Over the next 120 days, patient will verbalize understanding of plan for effective management of depression  . Over the next 120 days, patient will work with [facility], [facility] team, and pcp to address needs related to expressed needs and depression  . Over the next 120 days, patient will demonstrate improved health management independence as evidenced bygetting needed ID and help with medications and other resources to improve health and well being. . Over the next 120 days, patient will work with CM team pharmacist to help with medications and getting medications . Over the next 120 days, patient will work with CM clinical social worker to help with depression and substance abuse  . Over the next 120 days, patient will work with care guides  (community agency) to assist with food resources and transportation . Over the next 120 days, the patient will demonstrate ongoing self health care management ability as evidenced by receiving needed services to maintain health and well being   Interventions:  . 1:1 collaboration with Kathrine Haddock, NP regarding development and update of comprehensive plan of care as evidenced by provider attestation and co-signature . Inter-disciplinary care team collaboration (see longitudinal plan of care) . Evaluation of current treatment plan related to depression and patient's adherence to plan as established by provider. 02-01-2021: States she is doing better with her depression. States she is wanting her husband to look over the paperwork and then she will mail. Encouraged the patient to make sure she does this soon so there will be more options for helping her with getting her medications and healthcare she needs.  . Advised patient to call the suicide hotline, RNCM or CCM team for suicidal ideation or help with depression  . Provided  education to patient re: resources available and how to effectively manage panic attacks. 02-01-2021: Denies any recent panic attacks. Was call on [facility] phone with the [facility]. States she feels better than she has in a while.  Nash Dimmer with CCM team and pcp regarding depression and patients expressed needs . Discussed plans with patient for ongoing care management follow up and provided patient with direct contact information for care management team . Provided patient with mindfulness and healthy ways to manage stress  educational materials related to depression . Care Guide referral for food, transportation and community resources . Social Work referral for help with depression and substance abuse issues- will reach out to LCSW to get her back on her schedule.  Marland Kitchen Pharmacy referral for help with medication assistance and cost constraints   Patient Goals/Self-Care Activities Over the next 120 days, patient will:  - Patient will self administer medications as prescribed Patient will attend all scheduled provider appointments Patient will call provider office for new concerns or questions Patient will work with BSW to address care coordination needs and will continue to work with the clinical team to address health care and disease management related needs.   - anxiety screen reviewed - depression screen reviewed - participation in psychiatric services encouraged - substance use assessed - substance use risk screen reviewed  Follow Up Plan: Telephone follow up appointment with care management team member scheduled for:03-22-2021 at 1 pm  Care Plan : RNCM: COPD (Adult)  Updates made by Vanita Ingles since 02/01/2021 12:00 AM    Problem: RNCM: Psychological Adjustment to Diagnosis (COPD)   Priority: Medium    Long-Range Goal: RNCM: Adjustment to Disease Achieved   Note:   Current Barriers:  Marland Kitchen Knowledge deficits related to basic understanding of COPD disease process . Knowledge  deficits related to basic COPD self care/management . Knowledge deficit related to basic understanding of how to use inhalers and how inhaled medications work . Knowledge deficit related to importance of energy conservation . Cannot afford prescribed medications . Transportation barriers . Limited Social Support . Unable to independently manage COPD . Unable to self administer medications as prescribed . Does not adhere to provider recommendations re: taking medication as prescribed, does not have insurance or ID card . Does not attend all scheduled provider appointments . Does not adhere to prescribed medication regimen . Does not adhere to prescribed psychotropic medication regimen . Lacks social connections . Does not contact provider office for questions/concerns  Case Manager Clinical Goal(s):  Over the next 120 days patient will report utilizing pursed lip breathing for shortness of breath  Over the next 120 days, patient will be able to verbalize understanding of COPD action plan and when to seek appropriate levels of medical care  Over the next 120 days, patient will engage in lite exercise as tolerated to build/regain stamina and strength and reduce shortness of breath through activity tolerance  Over the next 120 days, patient will verbalize basic understanding of COPD disease process and self care activities  Interventions:   UNABLE to independently: manage COPD  Provided patient with basic written and verbal COPD education on self care/management/and exacerbation prevention. 02-01-2021: States she is having issues with her breathing but she is pacing her activity. Encouraged the patient to get an appointment with the pcp. She said she will have to wait until the first of the month. Working with patient to manage her health and well being.   Provided patient with COPD action plan and reinforced importance of daily self assessment. 02-01-2021: Review of triggers. Patient is  stable at this time.   Provided written and verbal instructions on pursed lip breathing and utilized returned demonstration as teach back  Advised patient to self assesses COPD action plan zone and make appointment with provider if in the yellow zone for 48 hours without improvement.  Provided patient with education about the role of exercise in the management of COPD  Provided education about and advised patient to utilize infection prevention strategies to reduce risk of respiratory infection. 02-01-2021: The patient states that she lost weight because she was sick for several days and got down to 92 pounds. The patient states she has gained some back now. Encouraged the patient to maintain weight and eat a balanced diet. The patient praised for small accomplishments.  Patient Goals/Self-Care Activities:  . - counseling provided . - decision-making supported . - depression screen reviewed . - emotional support provided . - problem-solving facilitated . - relaxation techniques promoted . - suicide risk screen reviewed . - suicide safety plan developed . - verbalization of feelings encouraged Follow Up Plan: Telephone follow up appointment with care management team member scheduled for: 03-22-2021 at 1 pm   Care Plan : RNCM: Bipolar General Plan of Care (Adult)  Updates made by Vanita Ingles since 02/01/2021 12:00 AM    Problem: RNCM: Effective management of Bipolar Disorder   Priority: Medium  Goal: RNCM: Bipolar Therapeutic Alliance Established   Priority: Medium  Note:   Current Barriers:  Marland Kitchen Knowledge Deficits related to support and behavioral health services for bipolar disorder  . Chronic Disease Management support and education needs related to bipolar disorder  . Lacks caregiver support.  . Film/video editor.  . Transportation barriers . Non-adherence to scheduled provider appointments . Non-adherence to prescribed medication regimen . Difficulty obtaining  medications . Unable to independently manage bipolar disorder and other chronic conditions  . Unable to self administer medications as prescribed . Does not adhere to prescribed medication regimen . Lacks social connections . Does not maintain contact with provider office . Does not contact provider office for questions/concerns  Nurse Case Manager Clinical Goal(s):  Marland Kitchen Over the next 120 days, patient will verbalize understanding of plan for bipolar disorder  . Over the next 120 days, patient will work with [facility] and CCM team  to address needs related to bipolar disorder  . Over the next 120 days, patient will work with CM team pharmacist to medication needs  . Over the next 120 days, patient will work with CM clinical social worker to assist with depression and bipolar  . Over the next 120 days, patient will work with care guides  (community agency) to get resources in the community  Interventions:  . 1:1 collaboration with Kathrine Haddock, NP regarding development and update of comprehensive plan of care as evidenced by provider attestation and co-signature . Inter-disciplinary care team collaboration (see longitudinal plan of care) . Evaluation of current treatment plan related to bipolar disorder  and patient's adherence to plan as established by provider. 02-01-2021: The patient is doing better and feels better. Agrees to talk to [facility] team LCSW for additional help. The patient is being positive and working toward getting her identification. Will continue to monitor for needs and changes.  . Advised patient to call the office for changes in bipolar and chronic conditions . Provided education to patient re: effective management of bipolar disorder  . Collaborated with CCM team and pcp  regarding expressed needs  . Care Guide referral for community resources  . Social Work referral for management of depression, and bipolar disorder  . Pharmacy referral for medication assistant  Patient  Goals/Self-Care Activities Over the next  120 days, patient will:  - Patient will self administer medications as prescribed Patient will attend all scheduled provider appointments Patient will call provider office for new concerns or questions Patient will work with BSW to address care coordination needs and will continue to work with the clinical team to address health care and disease management related needs.   - care explained - choices provided - collaboration with team encouraged - emotional support provided - empathic listening provided - questions answered - questions encouraged - rapport fostered - reassurance provided - self-reliance encouraged - verbalization of feelings encouraged  Follow Up Plan: Telephone follow up appointment with care management team member scheduled for: 03-22-2021 at 1 pm         Plan: Telephone follow up appointment with care management team member scheduled for:  03-22-2021 at 1 pm  Noreene Larsson RN, MSN, [facility] Mobile: [phone]

## 2021-02-01 NOTE — Patient Instructions (Signed)
Visit Information  Goals Addressed            This Visit's Progress   . Stop or Cut Down Using Drugs       Timeframe:  Long-Range Goal Priority:  High Start Date:                             Expected End Date:                       Follow Up Date 03-22-2021   - avoid people and places where drugs are used - begin detoxification or rehabilitation - begin personal counseling - develop a plan to avoid relapse - limit how much I use    Why is this important?    To stop using drugs it is important to have support from a person, or group of people, that you can count on.   You will also need to think about the things that make you feel like using drugs; then plan for how to handle them.     Notes: 02-01-2021: The patient states that she has cut back on her smoking and her drinking. She is smoking less than 1/2 a pack per day of cigarettes and has cut back on drinking also. Praised the patient for taking steps to cutting back and quitting.       Patient Care Plan: RNCM: Substance Misuse (Adult)    Problem Identified: RNCM: Substance abuse of ETOH, Illicit drugs, and active smoker-Addictive Behavior   Priority: High  Note:   Drinks 64 ounces of ETOH a day Smokes 1/2 PPD for 36 years Uses Marijuana and Cocaine almost daily   Long-Range Goal: RNCM: Substance abuse management: ETOH, Illicit drugs, Active Smoker   Priority: High  Note:   Current Barriers:  . Unable to independently manage substance abuse: Active drinker, use of marijuana and cocaine and active smoker  . Does not adhere to provider recommendations re: cessation of ETOH, illicit drugs and smoking . Does not attend all scheduled provider appointments . Lacks social connections . Does not maintain contact with provider office . Does not contact provider office for questions/concerns . Tobacco abuse of 36 years; currently smoking 1/2 ppd . Drinks 64 ounces of ETOH a day . Uses Marijuana and Cocaine almost  daily . Previous quit attempts, unsuccessful 0 successful using 0 . Reports smoking within 30 minutes of waking up . Reports triggers to smoke include: stress, anxiety, multiple chronic conditions  . Reports motivation to quit smoking includes: none at this time . On a scale of 1-10, reports MOTIVATION to quit is 0 . On a scale of 1-10, reports CONFIDENCE in quitting is 0 Clinical Goal(s):  Marland Kitchen Over the next 120 days, patient will work with [facility] Licensed Clinical Social Worker and provider towards tobacco cessation  Interventions: . Evaluation of current treatment plan reviewed. 02-01-2021: The patient reports she has cut back on her smoking and drinking. She is smoking <1/2 pack per day and has cut back on alcohol consumption. Praised the patient for positive changes in her habits.  . Provided contact information for [place] [facility] (1-800-QUIT-NOW). Patient will outreach this group for support. . Discussed plans with patient for ongoing care management follow up and provided patient with direct contact information for care management team . Provided patient with printed smoking cessation educational materials . Referred patient to pharmacy team . Provided contact  information for [place] [facility] (1-800-QUIT-NOW). Patient will outreach this group for support. . Evaluation of current treatment plan reviewed.  Review of desire to stop drinking and using illicit drugs. The patient is tearful and ask for help but does not know how to start changing her current circumstances. Agrees to work with the CCM team.  Patient Goals/Self-Care Activities . Over the next 120 days, patient will:  .  - brief intervention provided - decision-making supported - family involvement promoted - family stress acknowledged - family support provided - medication side effects managed - mental health services provided - participation in addiction treatment encouragement - positive reinforcement  provided - problem-solving facilitated - resources required to manage barriers identified - safety net resources provided - verbalization of feelings encouraged - verbally commit to reducing tobacco consumption Follow Up Plan: Telephone follow up appointment with care management team member scheduled for: 03-22-2021 at 1 pm   Task: RNCM: Alleviate Barriers to Substance Misuse Treatment   Note:   Care Management Activities:    - brief intervention provided - decision-making supported - family involvement promoted - family stress acknowledged - family support provided - medication side effects managed - mental health services provided - participation in addiction treatment encouragement - positive reinforcement provided - problem-solving facilitated - resources required to manage barriers identified - safety net resources provided - verbalization of feelings encouraged       Patient Care Plan: RNCM: Hypertension (Adult)    Problem Identified: RNCM: Hypertension (Hypertension)   Priority: Medium    Goal: RNCM: Hypertension Monitored   Priority: Medium  Note:   Objective:  . Last practice recorded BP readings:  . BP Readings from Last 3 Encounters: .  01/09/21 . (!) 121/99 .  08/16/20 . 79/39 .  08/18/19 . 114/74 .    Marland Kitchen Most recent eGFR/CrCl: No results found for: EGFR  No components found for: CRCL Current Barriers:  Marland Kitchen Knowledge Deficits related to basic understanding of hypertension pathophysiology and self care management . Knowledge Deficits related to understanding of medications prescribed for management of hypertension . Difficulty obtaining medications . Non-adherence to prescribed medication regimen . Non-adherence to scheduled provider appointments . Transportation barriers . Limited Social Support . Film/video editor.  . Unable to independently manage HTN . Unable to self administer medications as prescribed . Does not adhere to provider recommendations  re: follow heart healthy diet, meet health and wellness goals due to chronic conditions and substance abuse . Does not adhere to prescribed medication regimen . Lacks social connections . Does not contact provider office for questions/concerns Case Manager Clinical Goal(s):  Marland Kitchen Over the next 120 days, patient will verbalize understanding of plan for hypertension management . Over the next 120 days, patient will attend all scheduled medical appointments: no upcoming appointments- knows to call office for changes  . Over the next 120 days, patient will demonstrate improved adherence to prescribed treatment plan for hypertension as evidenced by taking all medications as prescribed, monitoring and recording blood pressure as directed, adhering to low sodium/DASH diet . Over the next 120 days, patient will demonstrate improved health management independence as evidenced by checking blood pressure as directed and notifying PCP if SBP>160 or DBP > 90, taking all medications as prescribe, and adhering to a low sodium diet as discussed. Interventions:  Marland Kitchen UNABLE to independently:manage HTN . Evaluation of current treatment plan related to hypertension self management and patient's adherence to plan as established by provider. 02-01-2021: Was seen in the ER  recently for migraine headache and states her pressures are higher when she has the migraines. The patient states that she is feeling better. Denies any acute distress. Is taking medications and compliant but will run out soon. Education on getting her paperwork completed so she can get assistance with medications.  . Provided education to patient re: stroke prevention, s/s of heart attack and stroke, DASH diet, complications of uncontrolled blood pressure . Reviewed medications with patient and discussed importance of compliance. 02-01-2021: Currently is compliant with her medications. Is paying for medications on her own but states she will run out soon.   . Discussed plans with patient for ongoing care management follow up and provided patient with direct contact information for care management team . Advised patient, providing education and rationale, to monitor blood pressure daily and record, calling PCP for findings outside established parameters.  Patient Goals/Self-Care Activities . Over the next 120 days, patient will:  - Self administers medications as prescribed Attends all scheduled provider appointments Calls provider office for new concerns, questions, or BP outside discussed parameters Checks BP and records as discussed Follows a low sodium diet/DASH diet - blood pressure trends reviewed - depression screen reviewed - home or ambulatory blood pressure monitoring encouraged Follow Up Plan: Telephone follow up appointment with care management team member scheduled for: 03-22-2021 1 pm   Task: RNCM: Identify and Monitor Blood Pressure Elevation   Note:   Care Management Activities:    - blood pressure trends reviewed - depression screen reviewed - home or ambulatory blood pressure monitoring encouraged      Patient Care Plan: RNCM: Depression (Adult)    Problem Identified: RNCM: Depression Identification (Depression)   Priority: High    Long-Range Goal: RNCM: Depressive Symptoms Identified   Priority: High  Note:   Current Barriers:  Marland Kitchen Knowledge Deficits related to resources available in the community to help with health and wellness and specific needs: has no ID or insurance  . Care Coordination needs related to help in the community for food and transportation  in a patient with depression and other chronic conditions . Chronic Disease Management support and education needs related to patient with chronic depression  . Lacks caregiver support.  . Film/video editor.  . Transportation barriers . Non-adherence to scheduled provider appointments . Non-adherence to prescribed medication regimen . Difficulty obtaining  medications . Unable to independently manage depression effectively . Unable to self administer medications as prescribed . Does not adhere to provider recommendations re: taking medications, utilization of resources in the community- behavioral health services . Does not adhere to prescribed medication regimen . Does not adhere to prescribed psychotropic medication regimen . Lacks social connections . Does not maintain contact with provider office . Does not contact provider office for questions/concerns  Nurse Case Manager Clinical Goal(s):  Marland Kitchen Over the next 120 days, patient will verbalize understanding of plan for effective management of depression  . Over the next 120 days, patient will work with [facility] [place] team, and pcp to address needs related to expressed needs and depression  . Over the next 120 days, patient will demonstrate improved health management independence as evidenced bygetting needed ID and help with medications and other resources to improve health and well being. . Over the next 120 days, patient will work with CM team pharmacist to help with medications and getting medications . Over the next 120 days, patient will work with CM clinical social worker to help with depression and substance abuse  .  Over the next 120 days, patient will work with care guides  (community agency) to assist with food resources and transportation . Over the next 120 days, the patient will demonstrate ongoing self health care management ability as evidenced by receiving needed services to maintain health and well being   Interventions:  . 1:1 collaboration with Kathrine Haddock, NP regarding development and update of comprehensive plan of care as evidenced by provider attestation and co-signature . Inter-disciplinary care team collaboration (see longitudinal plan of care) . Evaluation of current treatment plan related to depression and patient's adherence to plan as established by provider. 02-01-2021:  States she is doing better with her depression. States she is wanting her husband to look over the paperwork and then she will mail. Encouraged the patient to make sure she does this soon so there will be more options for helping her with getting her medications and healthcare she needs.  . Advised patient to call the suicide hotline, RNCM or CCM team for suicidal ideation or help with depression  . Provided education to patient re: resources available and how to effectively manage panic attacks. 02-01-2021: Denies any recent panic attacks. Was call on the phone with the [facility] States she feels better than she has in a while.  Nash Dimmer with CCM team and pcp regarding depression and patients expressed needs . Discussed plans with patient for ongoing care management follow up and provided patient with direct contact information for care management team . Provided patient with mindfulness and healthy ways to manage stress  educational materials related to depression . Care Guide referral for food, transportation and community resources . Social Work referral for help with depression and substance abuse issues- will reach out to LCSW to get her back on her schedule.  Marland Kitchen Pharmacy referral for help with medication assistance and cost constraints   Patient Goals/Self-Care Activities Over the next 120 days, patient will:  - Patient will self administer medications as prescribed Patient will attend all scheduled provider appointments Patient will call provider office for new concerns or questions Patient will work with BSW to address care coordination needs and will continue to work with the clinical team to address health care and disease management related needs.   - anxiety screen reviewed - depression screen reviewed - participation in psychiatric services encouraged - substance use assessed - substance use risk screen reviewed  Follow Up Plan: Telephone follow up appointment with care management  team member scheduled for:03-22-2021 at 1 pm       Task: RNCM: Identify Depressive Symptoms and Facilitate Treatment   Note:   Care Management Activities:    - anxiety screen reviewed - depression screen reviewed - participation in psychiatric services encouraged - substance use assessed - substance use risk screen reviewed       Patient Care Plan: RNCM: COPD (Adult)    Problem Identified: RNCM: Psychological Adjustment to Diagnosis (COPD)   Priority: Medium    Long-Range Goal: RNCM: Adjustment to Disease Achieved   Note:   Current Barriers:  Marland Kitchen Knowledge deficits related to basic understanding of COPD disease process . Knowledge deficits related to basic COPD self care/management . Knowledge deficit related to basic understanding of how to use inhalers and how inhaled medications work . Knowledge deficit related to importance of energy conservation . Cannot afford prescribed medications . Transportation barriers . Limited Social Support . Unable to independently manage COPD . Unable to self administer medications as prescribed . Does not adhere to provider recommendations  re: taking medication as prescribed, does not have insurance or ID card . Does not attend all scheduled provider appointments . Does not adhere to prescribed medication regimen . Does not adhere to prescribed psychotropic medication regimen . Lacks social connections . Does not contact provider office for questions/concerns  Case Manager Clinical Goal(s):  Over the next 120 days patient will report utilizing pursed lip breathing for shortness of breath  Over the next 120 days, patient will be able to verbalize understanding of COPD action plan and when to seek appropriate levels of medical care  Over the next 120 days, patient will engage in lite exercise as tolerated to build/regain stamina and strength and reduce shortness of breath through activity tolerance  Over the next 120 days, patient will  verbalize basic understanding of COPD disease process and self care activities  Interventions:   UNABLE to independently: manage COPD  Provided patient with basic written and verbal COPD education on self care/management/and exacerbation prevention. 02-01-2021: States she is having issues with her breathing but she is pacing her activity. Encouraged the patient to get an appointment with the pcp. She said she will have to wait until the first of the month. Working with patient to manage her health and well being.   Provided patient with COPD action plan and reinforced importance of daily self assessment. 02-01-2021: Review of triggers. Patient is stable at this time.   Provided written and verbal instructions on pursed lip breathing and utilized returned demonstration as teach back  Advised patient to self assesses COPD action plan zone and make appointment with provider if in the yellow zone for 48 hours without improvement.  Provided patient with education about the role of exercise in the management of COPD  Provided education about and advised patient to utilize infection prevention strategies to reduce risk of respiratory infection. 02-01-2021: The patient states that she lost weight because she was sick for several days and got down to 92 pounds. The patient states she has gained some back now. Encouraged the patient to maintain weight and eat a balanced diet. The patient praised for small accomplishments.  Patient Goals/Self-Care Activities:  . - counseling provided . - decision-making supported . - depression screen reviewed . - emotional support provided . - problem-solving facilitated . - relaxation techniques promoted . - suicide risk screen reviewed . - suicide safety plan developed . - verbalization of feelings encouraged Follow Up Plan: Telephone follow up appointment with care management team member scheduled for: 03-22-2021 at 1 pm   Task: RNCM: Support Psychosocial Response to  Chronic Obstructive Pulmonary Disease   Note:   Care Management Activities:    - counseling provided - decision-making supported - depression screen reviewed - emotional support provided - problem-solving facilitated - relaxation techniques promoted - suicide risk screen reviewed - suicide safety plan developed - verbalization of feelings encouraged      Patient Care Plan: RNCM: Bipolar General Plan of Care (Adult)    Problem Identified: RNCM: Effective management of Bipolar Disorder   Priority: Medium    Goal: RNCM: Bipolar Therapeutic Alliance Established   Priority: Medium  Note:   Current Barriers:  Marland Kitchen Knowledge Deficits related to support and behavioral health services for bipolar disorder  . Chronic Disease Management support and education needs related to bipolar disorder  . Lacks caregiver support.  . Film/video editor.  . Transportation barriers . Non-adherence to scheduled provider appointments . Non-adherence to prescribed medication regimen . Difficulty obtaining medications . Unable to  independently manage bipolar disorder and other chronic conditions  . Unable to self administer medications as prescribed . Does not adhere to prescribed medication regimen . Lacks social connections . Does not maintain contact with provider office . Does not contact provider office for questions/concerns  Nurse Case Manager Clinical Goal(s):  Marland Kitchen Over the next 120 days, patient will verbalize understanding of plan for bipolar disorder  . Over the next 120 days, patient will work with [facility] and CCM team  to address needs related to bipolar disorder  . Over the next 120 days, patient will work with CM team pharmacist to medication needs  . Over the next 120 days, patient will work with CM clinical social worker to assist with depression and bipolar  . Over the next 120 days, patient will work with care guides  (community agency) to get resources in the community  Interventions:   . 1:1 collaboration with Kathrine Haddock, NP regarding development and update of comprehensive plan of care as evidenced by provider attestation and co-signature . Inter-disciplinary care team collaboration (see longitudinal plan of care) . Evaluation of current treatment plan related to bipolar disorder  and patient's adherence to plan as established by provider. 02-01-2021: The patient is doing better and feels better. Agrees to talk to [facility] team LCSW for additional help. The patient is being positive and working toward getting her identification. Will continue to monitor for needs and changes.  . Advised patient to call the office for changes in bipolar and chronic conditions . Provided education to patient re: effective management of bipolar disorder  . Collaborated with CCM team and pcp  regarding expressed needs  . Care Guide referral for community resources  . Social Work referral for management of depression, and bipolar disorder  . Pharmacy referral for medication assistant  Patient Goals/Self-Care Activities Over the next  120 days, patient will:  - Patient will self administer medications as prescribed Patient will attend all scheduled provider appointments Patient will call provider office for new concerns or questions Patient will work with BSW to address care coordination needs and will continue to work with the clinical team to address health care and disease management related needs.   - care explained - choices provided - collaboration with team encouraged - emotional support provided - empathic listening provided - questions answered - questions encouraged - rapport fostered - reassurance provided - self-reliance encouraged - verbalization of feelings encouraged  Follow Up Plan: Telephone follow up appointment with care management team member scheduled for: 03-22-2021 at 1 pm       Task: RNCM: Develop Relationship to Effect Behavior Change   Note:   Care Management  Activities:    - care explained - choices provided - collaboration with team encouraged - emotional support provided - empathic listening provided - questions answered - questions encouraged - rapport fostered - reassurance provided - self-reliance encouraged - verbalization of feelings encouraged        Patient Care Plan: RNCM: Substance Misuse (Adult)    Problem Identified: RNCM: Substance abuse of ETOH, Illicit drugs, and active smoker-Addictive Behavior   Priority: High  Note:   Drinks 64 ounces of ETOH a day Smokes 1/2 PPD for 36 years Uses Marijuana and Cocaine almost daily   Long-Range Goal: RNCM: Substance abuse management: ETOH, Illicit drugs, Active Smoker   Priority: High  Note:   Current Barriers:  . Unable to independently manage substance abuse: Active drinker, use of marijuana and cocaine and active smoker  .  Does not adhere to provider recommendations re: cessation of ETOH, illicit drugs and smoking . Does not attend all scheduled provider appointments . Lacks social connections . Does not maintain contact with provider office . Does not contact provider office for questions/concerns . Tobacco abuse of 36 years; currently smoking 1/2 ppd . Drinks 64 ounces of ETOH a day . Uses Marijuana and Cocaine almost daily . Previous quit attempts, unsuccessful 0 successful using 0 . Reports smoking within 30 minutes of waking up . Reports triggers to smoke include: stress, anxiety, multiple chronic conditions  . Reports motivation to quit smoking includes: none at this time . On a scale of 1-10, reports MOTIVATION to quit is 0 . On a scale of 1-10, reports CONFIDENCE in quitting is 0 Clinical Goal(s):  Marland Kitchen Over the next 120 days, patient will work with Comptroller Licensed Clinical Social Worker and provider towards tobacco cessation  Interventions: . Evaluation of current treatment plan reviewed. 02-01-2021: The patient reports she has cut back on her  smoking and drinking. She is smoking <1/2 pack per day and has cut back on alcohol consumption. Praised the patient for positive changes in her habits.  . Provided contact information for [place] [facility] (1-800-QUIT-NOW). Patient will outreach this group for support. . Discussed plans with patient for ongoing care management follow up and provided patient with direct contact information for care management team . Provided patient with printed smoking cessation educational materials . Referred patient to pharmacy team . Provided contact information for [place] [facility] (1-800-QUIT-NOW). Patient will outreach this group for support. . Evaluation of current treatment plan reviewed.  Review of desire to stop drinking and using illicit drugs. The patient is tearful and ask for help but does not know how to start changing her current circumstances. Agrees to work with the CCM team.  Patient Goals/Self-Care Activities . Over the next 120 days, patient will:  .  - brief intervention provided - decision-making supported - family involvement promoted - family stress acknowledged - family support provided - medication side effects managed - mental health services provided - participation in addiction treatment encouragement - positive reinforcement provided - problem-solving facilitated - resources required to manage barriers identified - safety net resources provided - verbalization of feelings encouraged - verbally commit to reducing tobacco consumption Follow Up Plan: Telephone follow up appointment with care management team member scheduled for: 03-22-2021 at 1 pm   Task: RNCM: Alleviate Barriers to Substance Misuse Treatment   Note:   Care Management Activities:    - brief intervention provided - decision-making supported - family involvement promoted - family stress acknowledged - family support provided - medication side effects managed - mental health services provided - participation in  addiction treatment encouragement - positive reinforcement provided - problem-solving facilitated - resources required to manage barriers identified - safety net resources provided - verbalization of feelings encouraged       Patient Care Plan: RNCM: Hypertension (Adult)    Problem Identified: RNCM: Hypertension (Hypertension)   Priority: Medium    Goal: RNCM: Hypertension Monitored   Priority: Medium  Note:   Objective:  . Last practice recorded BP readings:  . BP Readings from Last 3 Encounters: .  01/09/21 . (!) 121/99 .  08/16/20 . 28/36 .  08/18/19 . 114/74 .    Marland Kitchen Most recent eGFR/CrCl: No results found for: EGFR  No components found for: CRCL Current Barriers:  Marland Kitchen Knowledge Deficits related to basic understanding of hypertension  pathophysiology and self care management . Knowledge Deficits related to understanding of medications prescribed for management of hypertension . Difficulty obtaining medications . Non-adherence to prescribed medication regimen . Non-adherence to scheduled provider appointments . Transportation barriers . Limited Social Support . Film/video editor.  . Unable to independently manage HTN . Unable to self administer medications as prescribed . Does not adhere to provider recommendations re: follow heart healthy diet, meet health and wellness goals due to chronic conditions and substance abuse . Does not adhere to prescribed medication regimen . Lacks social connections . Does not contact provider office for questions/concerns Case Manager Clinical Goal(s):  Marland Kitchen Over the next 120 days, patient will verbalize understanding of plan for hypertension management . Over the next 120 days, patient will attend all scheduled medical appointments: no upcoming appointments- knows to call office for changes  . Over the next 120 days, patient will demonstrate improved adherence to prescribed treatment plan for hypertension as evidenced by taking all  medications as prescribed, monitoring and recording blood pressure as directed, adhering to low sodium/DASH diet . Over the next 120 days, patient will demonstrate improved health management independence as evidenced by checking blood pressure as directed and notifying PCP if SBP>160 or DBP > 90, taking all medications as prescribe, and adhering to a low sodium diet as discussed. Interventions:  Marland Kitchen UNABLE to independently:manage HTN . Evaluation of current treatment plan related to hypertension self management and patient's adherence to plan as established by provider. 02-01-2021: Was seen in the ER recently for migraine headache and states her pressures are higher when she has the migraines. The patient states that she is feeling better. Denies any acute distress. Is taking medications and compliant but will run out soon. Education on getting her paperwork completed so she can get assistance with medications.  . Provided education to patient re: stroke prevention, s/s of heart attack and stroke, DASH diet, complications of uncontrolled blood pressure . Reviewed medications with patient and discussed importance of compliance. 02-01-2021: Currently is compliant with her medications. Is paying for medications on her own but states she will run out soon.  . Discussed plans with patient for ongoing care management follow up and provided patient with direct contact information for care management team . Advised patient, providing education and rationale, to monitor blood pressure daily and record, calling PCP for findings outside established parameters.  Patient Goals/Self-Care Activities . Over the next 120 days, patient will:  - Self administers medications as prescribed Attends all scheduled provider appointments Calls provider office for new concerns, questions, or BP outside discussed parameters Checks BP and records as discussed Follows a low sodium diet/DASH diet - blood pressure trends reviewed -  depression screen reviewed - home or ambulatory blood pressure monitoring encouraged Follow Up Plan: Telephone follow up appointment with care management team member scheduled for: 03-22-2021 1 pm   Task: RNCM: Identify and Monitor Blood Pressure Elevation   Note:   Care Management Activities:    - blood pressure trends reviewed - depression screen reviewed - home or ambulatory blood pressure monitoring encouraged      Patient Care Plan: RNCM: Depression (Adult)    Problem Identified: RNCM: Depression Identification (Depression)   Priority: High    Long-Range Goal: RNCM: Depressive Symptoms Identified   Priority: High  Note:   Current Barriers:  Marland Kitchen Knowledge Deficits related to resources available in the community to help with health and wellness and specific needs: has no ID or insurance  . Care  Coordination needs related to help in the community for food and transportation  in a patient with depression and other chronic conditions . Chronic Disease Management support and education needs related to patient with chronic depression  . Lacks caregiver support.  . Film/video editor.  . Transportation barriers . Non-adherence to scheduled provider appointments . Non-adherence to prescribed medication regimen . Difficulty obtaining medications . Unable to independently manage depression effectively . Unable to self administer medications as prescribed . Does not adhere to provider recommendations re: taking medications, utilization of resources in the community- behavioral health services . Does not adhere to prescribed medication regimen . Does not adhere to prescribed psychotropic medication regimen . Lacks social connections . Does not maintain contact with provider office . Does not contact provider office for questions/concerns  Nurse Case Manager Clinical Goal(s):  Marland Kitchen Over the next 120 days, patient will verbalize understanding of plan for effective management of depression   . Over the next 120 days, patient will work with [facility], [place] team, and pcp to address needs related to expressed needs and depression  . Over the next 120 days, patient will demonstrate improved health management independence as evidenced bygetting needed ID and help with medications and other resources to improve health and well being. . Over the next 120 days, patient will work with CM team pharmacist to help with medications and getting medications . Over the next 120 days, patient will work with CM clinical social worker to help with depression and substance abuse  . Over the next 120 days, patient will work with care guides  (community agency) to assist with food resources and transportation . Over the next 120 days, the patient will demonstrate ongoing self health care management ability as evidenced by receiving needed services to maintain health and well being   Interventions:  . 1:1 collaboration with Kathrine Haddock, NP regarding development and update of comprehensive plan of care as evidenced by provider attestation and co-signature . Inter-disciplinary care team collaboration (see longitudinal plan of care) . Evaluation of current treatment plan related to depression and patient's adherence to plan as established by provider. 02-01-2021: States she is doing better with her depression. States she is wanting her husband to look over the paperwork and then she will mail. Encouraged the patient to make sure she does this soon so there will be more options for helping her with getting her medications and healthcare she needs.  . Advised patient to call the suicide hotline, RNCM or CCM team for suicidal ideation or help with depression  . Provided education to patient re: resources available and how to effectively manage panic attacks. 02-01-2021: Denies any recent panic attacks. Was call on [facility] phone with the [facility]. States she feels better than she has in a while.  Nash Dimmer with CCM team and  pcp regarding depression and patients expressed needs . Discussed plans with patient for ongoing care management follow up and provided patient with direct contact information for care management team . Provided patient with mindfulness and healthy ways to manage stress  educational materials related to depression . Care Guide referral for food, transportation and community resources . Social Work referral for help with depression and substance abuse issues- will reach out to LCSW to get her back on her schedule.  Marland Kitchen Pharmacy referral for help with medication assistance and cost constraints   Patient Goals/Self-Care Activities Over the next 120 days, patient will:  - Patient will self administer medications as prescribed Patient will  attend all scheduled provider appointments Patient will call provider office for new concerns or questions Patient will work with BSW to address care coordination needs and will continue to work with the clinical team to address health care and disease management related needs.   - anxiety screen reviewed - depression screen reviewed - participation in psychiatric services encouraged - substance use assessed - substance use risk screen reviewed  Follow Up Plan: Telephone follow up appointment with care management team member scheduled for:03-22-2021 at 1 pm       Task: RNCM: Identify Depressive Symptoms and Facilitate Treatment   Note:   Care Management Activities:    - anxiety screen reviewed - depression screen reviewed - participation in psychiatric services encouraged - substance use assessed - substance use risk screen reviewed       Patient Care Plan: RNCM: COPD (Adult)    Problem Identified: RNCM: Psychological Adjustment to Diagnosis (COPD)   Priority: Medium    Long-Range Goal: RNCM: Adjustment to Disease Achieved   Note:   Current Barriers:  Marland Kitchen Knowledge deficits related to basic understanding of COPD disease process . Knowledge  deficits related to basic COPD self care/management . Knowledge deficit related to basic understanding of how to use inhalers and how inhaled medications work . Knowledge deficit related to importance of energy conservation . Cannot afford prescribed medications . Transportation barriers . Limited Social Support . Unable to independently manage COPD . Unable to self administer medications as prescribed . Does not adhere to provider recommendations re: taking medication as prescribed, does not have insurance or ID card . Does not attend all scheduled provider appointments . Does not adhere to prescribed medication regimen . Does not adhere to prescribed psychotropic medication regimen . Lacks social connections . Does not contact provider office for questions/concerns  Case Manager Clinical Goal(s):  Over the next 120 days patient will report utilizing pursed lip breathing for shortness of breath  Over the next 120 days, patient will be able to verbalize understanding of COPD action plan and when to seek appropriate levels of medical care  Over the next 120 days, patient will engage in lite exercise as tolerated to build/regain stamina and strength and reduce shortness of breath through activity tolerance  Over the next 120 days, patient will verbalize basic understanding of COPD disease process and self care activities  Interventions:   UNABLE to independently: manage COPD  Provided patient with basic written and verbal COPD education on self care/management/and exacerbation prevention. 02-01-2021: States she is having issues with her breathing but she is pacing her activity. Encouraged the patient to get an appointment with the pcp. She said she will have to wait until the first of the month. Working with patient to manage her health and well being.   Provided patient with COPD action plan and reinforced importance of daily self assessment. 02-01-2021: Review of triggers. Patient is  stable at this time.   Provided written and verbal instructions on pursed lip breathing and utilized returned demonstration as teach back  Advised patient to self assesses COPD action plan zone and make appointment with provider if in the yellow zone for 48 hours without improvement.  Provided patient with education about the role of exercise in the management of COPD  Provided education about and advised patient to utilize infection prevention strategies to reduce risk of respiratory infection. 02-01-2021: The patient states that she lost weight because she was sick for several days and got down to 92 pounds. The  patient states she has gained some back now. Encouraged the patient to maintain weight and eat a balanced diet. The patient praised for small accomplishments.  Patient Goals/Self-Care Activities:  . - counseling provided . - decision-making supported . - depression screen reviewed . - emotional support provided . - problem-solving facilitated . - relaxation techniques promoted . - suicide risk screen reviewed . - suicide safety plan developed . - verbalization of feelings encouraged Follow Up Plan: Telephone follow up appointment with care management team member scheduled for: 03-22-2021 at 1 pm   Task: RNCM: Support Psychosocial Response to Chronic Obstructive Pulmonary Disease   Note:   Care Management Activities:    - counseling provided - decision-making supported - depression screen reviewed - emotional support provided - problem-solving facilitated - relaxation techniques promoted - suicide risk screen reviewed - suicide safety plan developed - verbalization of feelings encouraged      Patient Care Plan: RNCM: Bipolar General Plan of Care (Adult)    Problem Identified: RNCM: Effective management of Bipolar Disorder   Priority: Medium    Goal: RNCM: Bipolar Therapeutic Alliance Established   Priority: Medium  Note:   Current Barriers:  Marland Kitchen Knowledge Deficits  related to support and behavioral health services for bipolar disorder  . Chronic Disease Management support and education needs related to bipolar disorder  . Lacks caregiver support.  . Film/video editor.  . Transportation barriers . Non-adherence to scheduled provider appointments . Non-adherence to prescribed medication regimen . Difficulty obtaining medications . Unable to independently manage bipolar disorder and other chronic conditions  . Unable to self administer medications as prescribed . Does not adhere to prescribed medication regimen . Lacks social connections . Does not maintain contact with provider office . Does not contact provider office for questions/concerns  Nurse Case Manager Clinical Goal(s):  Marland Kitchen Over the next 120 days, patient will verbalize understanding of plan for bipolar disorder  . Over the next 120 days, patient will work with [facility] and CCM team  to address needs related to bipolar disorder  . Over the next 120 days, patient will work with CM team pharmacist to medication needs  . Over the next 120 days, patient will work with CM clinical social worker to assist with depression and bipolar  . Over the next 120 days, patient will work with care guides  (community agency) to get resources in the community  Interventions:  . 1:1 collaboration with Kathrine Haddock, NP regarding development and update of comprehensive plan of care as evidenced by provider attestation and co-signature . Inter-disciplinary care team collaboration (see longitudinal plan of care) . Evaluation of current treatment plan related to bipolar disorder  and patient's adherence to plan as established by provider. 02-01-2021: The patient is doing better and feels better. Agrees to talk to [facility] team LCSW for additional help. The patient is being positive and working toward getting her identification. Will continue to monitor for needs and changes.  . Advised patient to call the office for changes  in bipolar and chronic conditions . Provided education to patient re: effective management of bipolar disorder  . Collaborated with CCM team and pcp  regarding expressed needs  . Care Guide referral for community resources  . Social Work referral for management of depression, and bipolar disorder  . Pharmacy referral for medication assistant  Patient Goals/Self-Care Activities Over the next  120 days, patient will:  - Patient will self administer medications as prescribed Patient will attend all scheduled provider appointments Patient  will call provider office for new concerns or questions Patient will work with BSW to address care coordination needs and will continue to work with the clinical team to address health care and disease management related needs.   - care explained - choices provided - collaboration with team encouraged - emotional support provided - empathic listening provided - questions answered - questions encouraged - rapport fostered - reassurance provided - self-reliance encouraged - verbalization of feelings encouraged  Follow Up Plan: Telephone follow up appointment with care management team member scheduled for: 03-22-2021 at 1 pm       Task: RNCM: Develop Relationship to Effect Behavior Change   Note:   Care Management Activities:    - care explained - choices provided - collaboration with team encouraged - emotional support provided - empathic listening provided - questions answered - questions encouraged - rapport fostered - reassurance provided - self-reliance encouraged - verbalization of feelings encouraged         The patient verbalized understanding of instructions, educational materials, and care plan provided today and declined offer to receive copy of patient instructions, educational materials, and care plan.   Telephone follow up appointment with care management team member scheduled for: 03-22-2021 at 1 pm  Noreene Larsson RN, MSN,  [facility] Mobile: [phone]

## 2021-02-04 NOTE — Telephone Encounter (Signed)
Lvm with message from nurse Pam .

## 2021-02-05 ENCOUNTER — Telehealth: Payer: Self-pay

## 2021-02-05 NOTE — Chronic Care Management (AMB) (Signed)
  Chronic Care Management   Note  02/05/2021 Name: [name] MRN: [id number] DOB: 1965/11/15  [name] is a 55 y.o. year old female who is a primary care patient of Kathrine Haddock, NP. [name] is currently enrolled in care management services. An additional referral for Noreene Larsson, [place], [place], [place] [address] Direct Dial: [phone] [email or web address] Website: [email or web address]  was placed.   Follow up plan: Unsuccessful telephone outreach attempt made. A HIPAA compliant phone message was left for the patient providing contact information and requesting a return call.  The care management team will reach out to the patient again over the next 7 days.  If patient returns call to provider office, please advise to call [place]  at [place], [place], [place], [place] [address] Direct Dial: [phone] [email or web address] Website: [email or web address]

## 2021-02-06 ENCOUNTER — Encounter: Payer: Self-pay | Admitting: Nurse Practitioner

## 2021-02-06 ENCOUNTER — Telehealth: Payer: Self-pay | Admitting: Licensed Clinical Social Worker

## 2021-02-06 ENCOUNTER — Telehealth: Payer: Self-pay

## 2021-02-06 NOTE — Telephone Encounter (Signed)
    Clinical Social Work  Care Management   Phone Outreach    02/06/2021 Name: [name] MRN: [id number] DOB: [birth year]  [name] is a 55 y.o. year old female who is a primary care patient of Kathrine Haddock, NP .   CCM LCSW reached out to patient today by phone to introduce self, assess needs and offer Care Management services and interventions.    Telephone outreach was unsuccessful  Plan:Will route chart to Care Guide to see if patient would like to schedule phone appointment   Review of patient status, including review of consultants reports, relevant laboratory and other test results, and collaboration with appropriate care team members and the patient's provider was performed as part of comprehensive patient evaluation and provision of care management services.     Christa See, MSW, [facility] Care Management [email or web address] Phone [phone] 2:54 PM

## 2021-02-06 NOTE — Telephone Encounter (Signed)
Copied from [place] [id number]. Topic: Appointment Scheduling - Scheduling Inquiry for Clinic >> Feb 06, 2021  3:02 PM Tessa Lerner A wrote: Reason for CRM: Patient would like to be contacted regarding scheduling a medication follow up visit   Agent was unable to successfully schedule visit at the time of call  Patient has an interest in being seen by Prov. Wicker if possible, patient has   Please contact to further advise when possible   lvm to schedule apt If pt calls back please let her know Julian Hy is not scheduled to be at our office soon as she is helping elsewhere.

## 2021-02-06 NOTE — Telephone Encounter (Signed)
Patient has made an additional call regarding the letter  The letter has been previously submitted and a copy of it can be found in the patient's chart   The patient needs a corrected letter with a Y instead of an I  Please contact to further advise when possible   Patient would like to be updated

## 2021-02-08 NOTE — Telephone Encounter (Signed)
Lvm to come pick letter up Per Joilza.

## 2021-02-08 NOTE — Telephone Encounter (Signed)
vm to schedule apt If pt calls back please let her know Julian Hy is not scheduled to be at our office soon as she is helping elsewhere.

## 2021-02-14 NOTE — Chronic Care Management (AMB) (Signed)
  Care Management   Note  02/14/2021 Name: [name] MRN: [id number] DOB: [birth year]  [name] is a 55 y.o. year old female who is a primary care patient of Venita Lick, NP and is actively engaged with the care management team. I reached out to Royanne Foots by phone today to assist with re-scheduling a follow up visit with the Licensed Clinical Social Worker  Follow up plan: Unsuccessful telephone outreach attempt made. A HIPAA compliant phone message was left for the patient providing contact information and requesting a return call.  The care management team will reach out to the patient again over the next 7 days.  If patient returns call to provider office, please advise to call [place]  at [place], [place], [place], [place] [address] Direct Dial: [phone] [email or web address] Website: [email or web address]

## 2021-02-18 NOTE — Telephone Encounter (Signed)
vm to schedule apt If pt calls back please let her know Julian Hy is not scheduled to be at our office soon as she is helping elsewhere

## 2021-02-19 NOTE — Telephone Encounter (Signed)
Lvm to make apt.  

## 2021-02-19 NOTE — Chronic Care Management (AMB) (Signed)
  Care Management   Note  02/19/2021 Name: MATTELYN IMHOFF MRN: [id number] DOB: Jan 17, 1966  [name] is a 55 y.o. year old female who is a primary care patient of Venita Lick, NP and is actively engaged with the care management team. I reached out to Royanne Foots by phone today to assist with re-scheduling a follow up visit with the Licensed Clinical Social Worker  Follow up plan: Unable to make contact on outreach attempts x 3. PCP Venita Lick, NP notified via routed documentation in medical record.   Noreene Larsson, [place], [place], [place] [address] Direct Dial: [phone] [email or web address] [email or web address] Website: [email or web address]

## 2021-02-19 NOTE — Telephone Encounter (Signed)
Alvin Critchley I just noticed that This is a [facility] patient not a [facility]

## 2021-03-04 ENCOUNTER — Ambulatory Visit: Payer: Self-pay | Admitting: Licensed Clinical Social Worker

## 2021-03-04 NOTE — Chronic Care Management (AMB) (Signed)
[facility]) Care Management  03/04/2021  [name] Nov 13, 1965 [id number]  CCM LCSW received message from Care Guide regarding case closure. Three unsuccessful attempts were completed without response from patient. Care guide has informed CCM team and PCP of LCSW case closure. Should needs arise, PCP can complete another referral for LCSW services.   Christa See, MSW, [email or web address] Phone [phone] 12:10 PM

## 2021-03-22 ENCOUNTER — Telehealth: Payer: Self-pay | Admitting: Licensed Clinical Social Worker

## 2021-03-22 ENCOUNTER — Ambulatory Visit: Payer: Self-pay | Admitting: General Practice

## 2021-03-22 ENCOUNTER — Telehealth: Payer: Self-pay | Admitting: General Practice

## 2021-03-22 DIAGNOSIS — F3181 Bipolar II disorder: Secondary | ICD-10-CM

## 2021-03-22 DIAGNOSIS — F1911 Other psychoactive substance abuse, in remission: Secondary | ICD-10-CM

## 2021-03-22 DIAGNOSIS — F339 Major depressive disorder, recurrent, unspecified: Secondary | ICD-10-CM

## 2021-03-22 DIAGNOSIS — I10 Essential (primary) hypertension: Secondary | ICD-10-CM

## 2021-03-22 DIAGNOSIS — J449 Chronic obstructive pulmonary disease, unspecified: Secondary | ICD-10-CM

## 2021-03-22 DIAGNOSIS — F1910 Other psychoactive substance abuse, uncomplicated: Secondary | ICD-10-CM

## 2021-03-22 NOTE — Telephone Encounter (Signed)
    Clinical Social Work  Care Management   Phone Outreach    03/22/2021 Name: [name] MRN: [id number] DOB: 1965/12/28  [name] is a 55 y.o. year old female who is a primary care patient of Cannady, Barbaraann Faster, NP .   CCM LCSW received message from Noreene Larsson, CCM RN CM, stating that patient agreed to work with CCM LCSW to assist with strengthening support with current stressors.   CCM LCSW reached out to patient today by phone to introduce self, assess needs and offer Care Management services and interventions. Telephone outreach was unsuccessful and a HIPPA compliant phone message was left for the patient providing contact information and requesting a return call.   Plan:CCM LCSW will wait for return call. If no return call is received, Will reach out to patient again in the next 30 days .   Review of patient status, including review of consultants reports, relevant laboratory and other test results, and collaboration with appropriate care team members and the patient's provider was performed as part of comprehensive patient evaluation and provision of care management services.    Christa See, MSW, [email or web address] Phone [phone] 2:23 PM

## 2021-03-22 NOTE — Chronic Care Management (AMB) (Signed)
Care Management    RN Visit Note  03/22/2021 Name: [name] MRN: [id number] DOB: 11/14/1965  Subjective: [name] is a 55 y.o. year old female who is a primary care patient of Cannady, Barbaraann Faster, NP. The care management team was consulted for assistance with disease management and care coordination needs.    Engaged with patient by telephone for follow up visit in response to provider referral for case management and/or care coordination services.   Consent to Services:   [name] was given information about Care Management services today including:  Care Management services includes personalized support from designated clinical staff supervised by her physician, including individualized plan of care and coordination with other care providers 24/7 contact phone numbers for assistance for urgent and routine care needs. The patient may stop case management services at any time by phone call to the office staff.  Patient agreed to services and consent obtained.   Assessment: Review of patient past medical history, allergies, medications, health status, including review of consultants reports, laboratory and other test data, was performed as part of comprehensive evaluation and provision of chronic care management services.   SDOH (Social Determinants of Health) assessments and interventions performed:    Care Plan  Allergies  Allergen Reactions   Penicillins Rash and Other (See Comments)    Has patient had a PCN reaction causing immediate rash, facial/tongue/throat swelling, SOB or lightheadedness with hypotension: No Has patient had a PCN reaction causing severe rash involving mucus membranes or skin necrosis: No Has patient had a PCN reaction that required hospitalization No Has patient had a PCN reaction occurring within the last 10 years: No If all of the above answers are "NO", then may proceed with Cephalosporin use.    Outpatient Encounter Medications as of 03/22/2021   Medication Sig Note   albuterol (VENTOLIN HFA) 108 (90 Base) MCG/ACT inhaler Inhale 1-2 puffs into the lungs every 4 (four) hours as needed for wheezing or shortness of breath.    levothyroxine (SYNTHROID) 125 MCG tablet Take 1 tablet (125 mcg total) by mouth daily. (Patient not taking: Reported on 03/22/2021) 03/22/2021: States she is out   QUEtiapine (SEROQUEL XR) 50 MG TB24 24 hr tablet Take 1 tablet (50 mg total) by mouth at bedtime. (Patient not taking: Reported on 03/22/2021) 03/22/2021: States she is out   No facility-administered encounter medications on file as of 03/22/2021.    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   Chronic pancreatitis ([place]) 03/18/2015   Hypothyroidism 03/18/2015   Hypertension 03/18/2013   Tobacco use disorder 01/14/2011   Mixed, or nondependent drug abuse, in remission ([place]) 12/26/2010    Conditions to be addressed/monitored: HTN, COPD, Anxiety, Depression, Bipolar Disorder, and substance misuse  Care Plan : RNCM: Substance Misuse (Adult)  Updates made by Vanita Ingles since 03/22/2021 12:00 AM     Problem: RNCM: Substance abuse of ETOH, Illicit drugs, and active smoker-Addictive Behavior   Priority: High  Note:   Drinks 64 ounces of ETOH a day Smokes 1/2 PPD for 36 years Uses Marijuana and Cocaine almost daily  Long-Range Goal: RNCM: Substance abuse management: ETOH, Illicit drugs, Active Smoker   Start Date: 02/01/2021  Expected End Date: 03/17/2022  This Visit's Progress: On track  Priority: High  Note:   Current Barriers:  Unable to independently  manage substance abuse: Active drinker, use of marijuana and cocaine and active smoker  Does not adhere to provider recommendations re: cessation of ETOH, illicit drugs and smoking Does not attend all scheduled provider appointments Lacks social connections Does not maintain contact with provider office Does not contact provider office for questions/concerns Tobacco abuse of 36 years; currently smoking 1/2 ppd Drinks 64 ounces of ETOH a day Uses Marijuana and Cocaine almost daily Previous quit attempts, unsuccessful 0 successful using 0 Reports smoking within 30 minutes of waking up Reports triggers to smoke include: stress, anxiety, multiple chronic conditions  Reports motivation to quit smoking includes: none at this time On a scale of 1-10, reports MOTIVATION to quit is 0 On a scale of 1-10, reports CONFIDENCE in quitting is 0 Clinical Goal(s):  Over the next 120 days, patient will work with RN Case Pharmacist, hospital Licensed Clinical Social Worker and provider towards tobacco cessation  Interventions: Evaluation of current treatment plan reviewed. 02-01-2021: The patient reports she has cut back on her smoking and drinking. She is smoking <1/2 pack per day and has cut back on alcohol consumption. Praised the patient for positive changes in her habits. 03-22-2021: The patient states she has quit drinking. That she cannot drink because it makes her sick. She states she is very sick and needs help. She was tearful. See other care plans for additional information.  Provided contact information for [place] [facility] (1-800-QUIT-NOW). Patient will outreach this group for support. Discussed plans with patient for ongoing care management follow up and provided patient with direct contact information for care management team Provided patient with printed smoking cessation educational materials Referred patient to pharmacy team Provided contact information for [place] [facility] (1-800-QUIT-NOW). Patient will  outreach this group for support. Evaluation of current treatment plan reviewed.  Review of desire to stop drinking and using illicit drugs. The patient is tearful and ask for help but does not know how to start changing her current circumstances. Agrees to work with the CCM team.  Patient Goals/Self-Care Activities Over the next 120 days, patient will:  - brief intervention provided - decision-making supported - family involvement promoted - family stress acknowledged - family support provided - medication side effects managed - mental health services provided - participation in addiction treatment encouragement - positive reinforcement provided - problem-solving facilitated - resources required to manage barriers identified - safety net resources provided - verbalization of feelings encouraged - verbally commit to reducing tobacco consumption Follow Up Plan: Telephone follow up appointment with care management team member scheduled for: 05-10-2021 at 230 pm    Task: RNCM: Alleviate Barriers to Substance Misuse Treatment Completed 03/22/2021  Outcome: Positive  Note:   Care Management Activities:    - brief intervention provided - decision-making supported - family involvement promoted - family stress acknowledged - family support provided - medication side effects managed - mental health services provided - participation in addiction treatment encouragement - positive reinforcement provided - problem-solving facilitated - resources required to manage barriers identified - safety net resources provided - verbalization of feelings encouraged        Care Plan : RNCM: Hypertension (Adult)  Updates made by Vanita Ingles since 03/22/2021 12:00 AM     Problem: RNCM: Hypertension (  Hypertension)   Priority: Medium     Long-Range Goal: RNCM: Hypertension Monitored   Start Date: 02/01/2021  Expected End Date: 12/07/2021  This Visit's Progress: On track  Priority: Medium  Note:    Objective:  Last practice recorded BP readings:  BP Readings from Last 3 Encounters:  01/09/21 (!) 121/99  08/16/20 99/64  08/18/19 114/74    Most recent eGFR/CrCl: No results found for: EGFR  No components found for: CRCL Current Barriers:  Knowledge Deficits related to basic understanding of hypertension pathophysiology and self care management Knowledge Deficits related to understanding of medications prescribed for management of hypertension Difficulty obtaining medications Non-adherence to prescribed medication regimen Non-adherence to scheduled provider appointments Transportation barriers Limited Social Designer, multimedia.  Unable to independently manage HTN Unable to self administer medications as prescribed Does not adhere to provider recommendations re: follow heart healthy diet, meet health and wellness goals due to chronic conditions and substance abuse Does not adhere to prescribed medication regimen Lacks social connections Does not contact provider office for questions/concerns Case Manager Clinical Goal(s):  Over the next 120 days, patient will verbalize understanding of plan for hypertension management Over the next 120 days, patient will attend all scheduled medical appointments: no upcoming appointments- knows to call office for changes  Over the next 120 days, patient will demonstrate improved adherence to prescribed treatment plan for hypertension as evidenced by taking all medications as prescribed, monitoring and recording blood pressure as directed, adhering to low sodium/DASH diet Over the next 120 days, patient will demonstrate improved health management independence as evidenced by checking blood pressure as directed and notifying PCP if SBP>160 or DBP > 90, taking all medications as prescribe, and adhering to a low sodium diet as discussed. Interventions:  UNABLE to independently:manage HTN Evaluation of current treatment plan related to  hypertension self management and patient's adherence to plan as established by provider. 02-01-2021: Was seen in the ER recently for migraine headache and states her pressures are higher when she has the migraines. The patient states that she is feeling better. Denies any acute distress. Is taking medications and compliant but will run out soon. Education on getting her paperwork completed so she can get assistance with medications. 03-22-2021: The patient states that she is not taking any medications because she can not come to the office or get refills. Collaboration with pcp and CCM team and staff.  Provided education to patient re: stroke prevention, s/s of heart attack and stroke, DASH diet, complications of uncontrolled blood pressure Reviewed medications with patient and discussed importance of compliance. 02-01-2021: Currently is compliant with her medications. Is paying for medications on her own but states she will run out soon. 03-22-2021: The patient states she is out of her medications.  Collaboration with pcp. Discussed plans with patient for ongoing care management follow up and provided patient with direct contact information for care management team Advised patient, providing education and rationale, to monitor blood pressure daily and record, calling PCP for findings outside established parameters.  Patient Goals/Self-Care Activities Over the next 120 days, patient will:  - Self administers medications as prescribed Attends all scheduled provider appointments Calls provider office for new concerns, questions, or BP outside discussed parameters Checks BP and records as discussed Follows a low sodium diet/DASH diet - blood pressure trends reviewed - depression screen reviewed - home or ambulatory blood pressure monitoring encouraged Follow Up Plan: Telephone follow up appointment with care management team member scheduled for: 05-10-2021 230 pm  Task: RNCM: Identify and Monitor Blood  Pressure Elevation Completed 03/22/2021  Outcome: Positive  Note:   Care Management Activities:    - blood pressure trends reviewed - depression screen reviewed - home or ambulatory blood pressure monitoring encouraged       Care Plan : RNCM: Depression (Adult)  Updates made by Vanita Ingles since 03/22/2021 12:00 AM     Problem: RNCM: Depression Identification (Depression)   Priority: High     Long-Range Goal: RNCM: Depressive Symptoms Identified   Start Date: 02/01/2021  Expected End Date: 03/17/2022  This Visit's Progress: On track  Priority: High  Note:   Current Barriers:  Knowledge Deficits related to resources available in the community to help with health and wellness and specific needs: has no ID or insurance  Care Coordination needs related to help in the community for food and transportation  in a patient with depression and other chronic conditions Chronic Disease Management support and education needs related to patient with chronic depression  Lacks caregiver support.  Film/video editor.  Transportation barriers Non-adherence to scheduled provider appointments Non-adherence to prescribed medication regimen Difficulty obtaining medications Unable to independently manage depression effectively Unable to self administer medications as prescribed Does not adhere to provider recommendations re: taking medications, utilization of resources in the community- behavioral health services Does not adhere to prescribed medication regimen Does not adhere to prescribed psychotropic medication regimen Lacks social connections Does not maintain contact with provider office Does not contact provider office for questions/concerns  Nurse Case Manager Clinical Goal(s):  Over the next 120 days, patient will verbalize understanding of plan for effective management of depression  Over the next 120 days, patient will work with [facility], [facility] team, and pcp to address needs related to  expressed needs and depression  Over the next 120 days, patient will demonstrate improved health management independence as evidenced bygetting needed ID and help with medications and other resources to improve health and well being. Over the next 120 days, patient will work with CM team pharmacist to help with medications and getting medications Over the next 120 days, patient will work with CM clinical social worker to help with depression and substance abuse  Over the next 120 days, patient will work with care guides  (community agency) to assist with food resources and transportation Over the next 120 days, the patient will demonstrate ongoing self health care management ability as evidenced by receiving needed services to maintain health and well being   Interventions:  1:1 collaboration with Venita Lick, NP regarding development and update of comprehensive plan of care as evidenced by provider attestation and co-signature Inter-disciplinary care team collaboration (see longitudinal plan of care) Evaluation of current treatment plan related to depression and patient's adherence to plan as established by provider. 02-01-2021: States she is doing better with her depression. States she is wanting her husband to look over the paperwork and then she will mail. Encouraged the patient to make sure she does this soon so there will be more options for helping her with getting her medications and healthcare she needs. 03-22-2021: The patient is tearful and crying on the phone today. She states that she is out of her medications and she cannot afford to come to see the provider. She said the letterhead that was sent to her had the wrong spelling of her name. She needs a new document with the right spelling on it as Eliseo Gum. [place] so she can take it to the Social  Security office and get a new Nurse, learning disability and therefore get an ID. Have ask the LCSW to reach out to the patient again. Have texted the  patient with the LCSW number. Have put in an emergent referral for care guide assistance to help with food resources. Education provided to the patient that she needs to talk to staff so that she can get the help that she needs. Will continue to monitor.  Advised patient to call the suicide hotline, RNCM or CCM team for suicidal ideation or help with depression  Provided education to patient re: resources available and how to effectively manage panic attacks. 02-01-2021: Denies any recent panic attacks. Was call on the phone with the [facility]. States she feels better than she has in a while. 03-22-2021: Education given today on resources and processes. Will follow up with the staff and pcp to see how we can best help the patient meet her needs  Collaborated with CCM team and pcp regarding depression and patients expressed needs Discussed plans with patient for ongoing care management follow up and provided patient with direct contact information for care management team. 03-22-2021: New message sent to the pcp and CCM team. LCSW is assisting with patient needs  Provided patient with mindfulness and healthy ways to manage stress  educational materials related to depression Care Guide referral for food, transportation and community resources. 03-22-2021: New careguide referral for help with food resources  Social Work referral for help with depression and substance abuse issues- will reach out to LCSW to get her back on her schedule. 03-22-2021: New referral for LCSW. Provided LCSW number to the patient to assist with needs  Pharmacy referral for help with medication assistance and cost constraints   Patient Goals/Self-Care Activities Over the next 120 days, patient will:  - Patient will self administer medications as prescribed Patient will attend all scheduled provider appointments Patient will call provider office for new concerns or questions Patient will work with BSW to address care coordination needs and will  continue to work with the clinical team to address health care and disease management related needs.   - anxiety screen reviewed - depression screen reviewed - participation in psychiatric services encouraged - substance use assessed - substance use risk screen reviewed  Follow Up Plan: Telephone follow up appointment with care management team member scheduled for: 05-10-2021 at 230 pm        Task: RNCM: Identify Depressive Symptoms and Facilitate Treatment Completed 03/22/2021  Outcome: Positive  Note:   Care Management Activities:    - anxiety screen reviewed - depression screen reviewed - participation in psychiatric services encouraged - substance use assessed - substance use risk screen reviewed        Care Plan : RNCM: COPD (Adult)  Updates made by Vanita Ingles since 03/22/2021 12:00 AM     Problem: RNCM: Psychological Adjustment to Diagnosis (COPD)   Priority: Medium     Long-Range Goal: RNCM: Adjustment to Disease Achieved   Start Date: 02/01/2021  Expected End Date: 11/17/2021  This Visit's Progress: On track  Priority: Medium  Note:   Current Barriers:  Knowledge deficits related to basic understanding of COPD disease process Knowledge deficits related to basic COPD self care/management Knowledge deficit related to basic understanding of how to use inhalers and how inhaled medications work Knowledge deficit related to importance of energy conservation Cannot afford prescribed medications Transportation barriers Limited Social Support Unable to independently manage COPD Unable to self administer medications as  prescribed Does not adhere to provider recommendations re: taking medication as prescribed, does not have insurance or ID card Does not attend all scheduled provider appointments Does not adhere to prescribed medication regimen Does not adhere to prescribed psychotropic medication regimen Lacks social connections Does not contact provider office for  questions/concerns  Case Manager Clinical Goal(s): Over the next 120 days patient will report utilizing pursed lip breathing for shortness of breath Over the next 120 days, patient will be able to verbalize understanding of COPD action plan and when to seek appropriate levels of medical care Over the next 120 days, patient will engage in lite exercise as tolerated to build/regain stamina and strength and reduce shortness of breath through activity tolerance Over the next 120 days, patient will verbalize basic understanding of COPD disease process and self care activities  Interventions:  UNABLE to independently: manage COPD Provided patient with basic written and verbal COPD education on self care/management/and exacerbation prevention. 02-01-2021: States she is having issues with her breathing but she is pacing her activity. Encouraged the patient to get an appointment with the pcp. She said she will have to wait until the first of the month. Working with patient to manage her health and well being. 03-22-2021: Denies issues with her breathing but states she is very very sick but cannot afford to come to see the provider. The patient states she has a huge "knot" above where she had her thyroid removed that has been there for 3 months but keeps growing. She is not taking he synthroid because she is out. She is tearful and asking for help. Education and support given.  Provided patient with COPD action plan and reinforced importance of daily self assessment. 02-01-2021: Review of triggers. Patient is stable at this time.  Provided written and verbal instructions on pursed lip breathing and utilized returned demonstration as teach back Advised patient to self assesses COPD action plan zone and make appointment with provider if in the yellow zone for 48 hours without improvement. Provided patient with education about the role of exercise in the management of COPD Provided education about and advised patient to  utilize infection prevention strategies to reduce risk of respiratory infection. 02-01-2021: The patient states that she lost weight because she was sick for several days and got down to 92 pounds. The patient states she has gained some back now. Encouraged the patient to maintain weight and eat a balanced diet. The patient praised for small accomplishments. 03-22-2021: States that she is 90 pounds and cannot eat. The patient states she has stopped drinking and is an alcoholic. The patient says she is having to walk with a cane. The patient states she is trying to help herself but she cannot get what she needs from the provider office. Education given. Empathetic listening. In basket messages sent. Will continue to collaborate with the team.  Patient Goals/Self-Care Activities:  - counseling provided - decision-making supported - depression screen reviewed - emotional support provided - problem-solving facilitated - relaxation techniques promoted - suicide risk screen reviewed - suicide safety plan developed - verbalization of feelings encouraged Follow Up Plan: Telephone follow up appointment with care management team member scheduled for: 05-10-2021 at 230 pm    Task: RNCM: Support Psychosocial Response to Chronic Obstructive Pulmonary Disease Completed 03/22/2021  Outcome: Positive  Note:   Care Management Activities:    - counseling provided - decision-making supported - depression screen reviewed - emotional support provided - problem-solving facilitated - relaxation techniques promoted -  suicide risk screen reviewed - suicide safety plan developed - verbalization of feelings encouraged       Care Plan : RNCM: Bipolar General Plan of Care (Adult)  Updates made by Vanita Ingles since 03/22/2021 12:00 AM     Problem: RNCM: Effective management of Bipolar Disorder   Priority: Medium     Long-Range Goal: RNCM: Bipolar Therapeutic Alliance Established   Start Date: 02/01/2021   Expected End Date: 03/17/2022  This Visit's Progress: Not on track  Priority: Medium  Note:   Current Barriers:  Knowledge Deficits related to support and behavioral health services for bipolar disorder  Chronic Disease Management support and education needs related to bipolar disorder  Lacks caregiver support.  Film/video editor.  Transportation barriers Non-adherence to scheduled provider appointments Non-adherence to prescribed medication regimen Difficulty obtaining medications Unable to independently manage bipolar disorder and other chronic conditions  Unable to self administer medications as prescribed Does not adhere to prescribed medication regimen Lacks social connections Does not maintain contact with provider office Does not contact provider office for questions/concerns  Nurse Case Manager Clinical Goal(s):  Over the next 120 days, patient will verbalize understanding of plan for bipolar disorder  Over the next 120 days, patient will work with [facility] and CCM team  to address needs related to bipolar disorder  Over the next 120 days, patient will work with CM team pharmacist to medication needs  Over the next 120 days, patient will work with CM clinical Education officer, museum to assist with depression and bipolar  Over the next 120 days, patient will work with care guides  (community agency) to get resources in the community  Interventions:  1:1 collaboration with Venita Lick, NP regarding development and update of comprehensive plan of care as evidenced by provider attestation and co-signature Inter-disciplinary care team collaboration (see longitudinal plan of care) Evaluation of current treatment plan related to bipolar disorder  and patient's adherence to plan as established by provider. 02-01-2021: The patient is doing better and feels better. Agrees to talk to [facility] team LCSW for additional help. The patient is being positive and working toward getting her identification.  Will continue to monitor for needs and changes. 03-22-2021: The patient is tearful and having outburst. She says she is trying but the office spelled her name wrong and the letter the office sent is not correct. Have contacted the staff to see how this can be corrected. Will send inbasket message to staff and office manager. Have reached out to the LCSW for assistance as well. Emergent referral sent to the care guides for assistance with food resources. Education provided to the patient that she will need to talk to the staff to get the needed items. The patient does not have her synthroid or Seroquel. Ask for help from the pcp. Will collaborate with the pcp  Advised patient to call the office for changes in bipolar and chronic conditions Provided education to patient re: effective management of bipolar disorder  Collaborated with CCM team and pcp  regarding expressed needs  Care Guide referral for community resources  Social Work referral for management of depression, and bipolar disorder  Pharmacy referral for medication assistant  Patient Goals/Self-Care Activities Over the next  120 days, patient will:  - Patient will self administer medications as prescribed Patient will attend all scheduled provider appointments Patient will call provider office for new concerns or questions Patient will work with BSW to address care coordination needs and will continue to work with  the clinical team to address health care and disease management related needs.   - care explained - choices provided - collaboration with team encouraged - emotional support provided - empathic listening provided - questions answered - questions encouraged - rapport fostered - reassurance provided - self-reliance encouraged - verbalization of feelings encouraged  Follow Up Plan: Telephone follow up appointment with care management team member scheduled for: 05-10-2021 at 230 pm        Task: RNCM: Develop Relationship to  Effect Behavior Change Completed 03/22/2021  Outcome: Positive  Note:   Care Management Activities:    - care explained - choices provided - collaboration with team encouraged - emotional support provided - empathic listening provided - questions answered - questions encouraged - rapport fostered - reassurance provided - self-reliance encouraged - verbalization of feelings encouraged         Plan: Telephone follow up appointment with care management team member scheduled for:  05-10-2021 at 230 pm  [place], MSN, [facility] Mobile: [phone]

## 2021-03-22 NOTE — Patient Instructions (Signed)
Visit Information     The patient verbalized understanding of instructions, educational materials, and care plan provided today and declined offer to receive copy of patient instructions, educational materials, and care plan.   Telephone follow up appointment with care management team member scheduled for: 05-10-2021 at 230 pm  Noreene Larsson RN, MSN, [facility] Mobile: [phone]

## 2021-03-26 ENCOUNTER — Telehealth: Payer: Self-pay | Admitting: Nurse Practitioner

## 2021-03-26 ENCOUNTER — Telehealth: Payer: Self-pay

## 2021-03-26 NOTE — Telephone Encounter (Signed)
Attempted to call patient, no answer left VM for patient to call back regarding letter.

## 2021-03-26 NOTE — Telephone Encounter (Signed)
Please assist

## 2021-03-26 NOTE — Telephone Encounter (Signed)
Copied from [place] [id number]. Topic: General - Other >> Mar 25, 2021 11:16 AM Leward Quan A wrote: Reason for CRM: Patient called in to inquire of Jolene for a letter on a [place] letter head that has her full name address and social security number. States that she need this ASAP have been waiting a few months patient say and  would like to come to the office and pick up this letter and also a printout of her demographics. Please call Ph# [phone] or [phone]

## 2021-03-26 NOTE — Telephone Encounter (Signed)
   Telephone encounter was:  Unsuccessful.  03/26/2021 Name: [name] MRN: [id number] DOB: 1966-09-08  Unsuccessful outbound call made today to assist with:  Food Insecurity and Financial Difficulties related to not having any financial help.  Outreach Attempt:  1st Attempt  A HIPAA compliant voice message was left requesting a return call.  Instructed patient to call back at [phone].  April Green Care Guide, Embedded Care Coordination [place], Care Management Phone: [phone] Email: [email or web address]

## 2021-03-27 NOTE — Telephone Encounter (Signed)
Called patient, no answer. LVM to call office back.

## 2021-03-29 ENCOUNTER — Telehealth: Payer: Self-pay

## 2021-03-29 ENCOUNTER — Telehealth: Payer: Self-pay | Admitting: Licensed Clinical Social Worker

## 2021-03-29 NOTE — Telephone Encounter (Signed)
3rd attempt to reach patient to gather more information about a letter the patient is requesting. Unable to reach patient at number listed in chart due to a busy signal.

## 2021-03-29 NOTE — Telephone Encounter (Signed)
    Clinical Social Work  Care Management   Phone Outreach    03/29/2021 Name: SAM WUNSCHEL MRN: [id number] DOB: [birth year]  OCTOBER PEERY is a 55 y.o. year old female who is a primary care patient of Cannady, Barbaraann Faster, NP .   CCM LCSW reached out to patient today by phone to introduce self, assess needs and offer Care Management services and interventions.    2nd unsuccessful telephone outreach attempt.  If unable to reach patient by phone on the 3rd attempt, will discontinue outreach calls but will be available at any time to provide services.   Plan:CCM LCSW will wait for return call. If no return call is received, Will reach out to patient again in the next 30 days .   Review of patient status, including review of consultants reports, relevant laboratory and other test results, and collaboration with appropriate care team members and the patient's provider was performed as part of comprehensive patient evaluation and provision of care management services.    Christa See, MSW, [email or web address] Phone [phone] 12:31 PM

## 2021-04-12 ENCOUNTER — Telehealth: Payer: Self-pay | Admitting: Nurse Practitioner

## 2021-04-12 NOTE — Telephone Encounter (Signed)
   Telephone encounter was:  Unsuccessful.  04/12/2021 Name: [name] MRN: [id number] DOB: December 20, 1965  Unsuccessful outbound call made today to assist with:  Food Insecurity and other financial strains.  Outreach Attempt:  2nd Attempt  A HIPAA compliant voice message was left requesting a return call.  Instructed patient to call back at [phone].  April Green Care Guide, Embedded Care Coordination [place], Care Management Phone: [phone] Email: [email or web address]

## 2021-05-01 ENCOUNTER — Telehealth: Payer: Self-pay | Admitting: Nurse Practitioner

## 2021-05-01 NOTE — Telephone Encounter (Signed)
   Telephone encounter was:  Unsuccessful.  05/01/2021 Name: [name] MRN: [id number] DOB: 1966-06-16  Unsuccessful outbound call made today to assist with:  Food Insecurity  Outreach Attempt:  3rd Attempt.  Referral closed unable to contact patient.  A HIPAA compliant voice message was left requesting a return call.  Instructed patient to call back at [phone].  April Green Care Guide, Embedded Care Coordination [place], Care Management Phone: [phone] Email: [email or web address]

## 2021-05-10 ENCOUNTER — Telehealth: Payer: Self-pay

## 2021-05-10 NOTE — Telephone Encounter (Signed)
  Care Management   Follow Up Note   05/10/2021 Name: RONELLA MECKEL MRN: [id number] DOB: 1966/07/02   Referred by: Venita Lick, NP Reason for referral : Care Coordination (RNCM: Follow up for Chronic Disease Management and Care Coordination Needs)   An unsuccessful telephone outreach was attempted today. The patient was referred to the case management team for assistance with care management and care coordination.   Follow Up Plan: A HIPPA compliant phone message was left for the patient providing contact information and requesting a return call.   Noreene Larsson RN, MSN, [facility] Mobile: [phone]

## 2021-06-13 ENCOUNTER — Telehealth: Payer: Self-pay | Admitting: Licensed Clinical Social Worker

## 2021-06-13 NOTE — Telephone Encounter (Signed)
    Clinical Social Work  Care Management   Phone Outreach    06/13/2021 Name: TAEJA DEBELLIS MRN: [id number] DOB: 1965-11-30  AUDRIELLA BLAKELEY is a 55 y.o. year old female who is a primary care patient of Cannady, Barbaraann Faster, NP .   Reason for referral: [employer] , Landscape architect, and Mental Health Counseling and Resources.    CCM LCSW received a missed call from patient. CCM LCSW returned call to assess needs, progress and barriers with care plan goals.  There was no answer and LCSW left HIPPA compliant voicemessage.  Plan:CCM LCSW will wait for return call.  Review of patient status, including review of consultants reports, relevant laboratory and other test results, and collaboration with appropriate care team members and the patient's provider was performed as part of comprehensive patient evaluation and provision of care management services.     Christa See, MSW, [email or web address] Phone [phone] 4:33 PM

## 2021-06-14 ENCOUNTER — Telehealth: Payer: Self-pay

## 2021-06-14 NOTE — Telephone Encounter (Signed)
Copied from [place] [id number]. Topic: General - Other >> Jun 13, 2021  4:27 PM Erick Blinks wrote: Reason for CRM: Pt's husband returned missed call, wants to know if there was anything he needed to speak to the office about.   Chart reviewed. Looks like [place] may have tried to contact the patient yesterday.

## 2021-06-18 ENCOUNTER — Telehealth: Payer: Self-pay | Admitting: Licensed Clinical Social Worker

## 2021-06-18 NOTE — Telephone Encounter (Signed)
    Clinical Social Work  Care Management   Phone Outreach    06/18/2021 Name: [name] MRN: [id number] DOB: Oct 15, 1965  [name] is a 55 y.o. year old female who is a primary care patient of Cannady, Barbaraann Faster, NP .   Reason for referral: Mental Health Counseling and Resources.    F/U phone call today to assess needs, progress and barriers with care plan goals.   2nd unsuccessful telephone outreach attempt.  If unable to reach patient by phone on the 3rd attempt, will discontinue outreach calls but will be available at any time to provide services.   Plan:CCM LCSW will wait for return call. If no return call is received, Will route chart to Care Guide to see if patient would like to reschedule phone appointment   Review of patient status, including review of consultants reports, relevant laboratory and other test results, and collaboration with appropriate care team members and the patient's provider was performed as part of comprehensive patient evaluation and provision of care management services.    Christa See, MSW, [email or web address] Phone [phone] 9:37 AM

## 2021-06-20 ENCOUNTER — Telehealth: Payer: Self-pay

## 2021-06-20 NOTE — Chronic Care Management (AMB) (Signed)
  Care Management   Note  06/20/2021 Name: [name] MRN: [id number] DOB: [birth year]  [name] is a 55 y.o. year old female who is a primary care patient of Venita Lick, NP and is actively engaged with the care management team. I reached out to Royanne Foots by phone today to assist with re-scheduling a follow up visit with the Licensed Clinical Social Worker  Follow up plan: Unsuccessful telephone outreach attempt made. A HIPAA compliant phone message was left for the patient providing contact information and requesting a return call.  The care management team will reach out to the patient again over the next 7 days.  If patient returns call to provider office, please advise to call Caroline  at [place], [place], [place], [place] [address] Direct Dial: [phone] [email or web address] Website: [email or web address]

## 2021-07-01 ENCOUNTER — Telehealth: Payer: Self-pay

## 2021-07-01 ENCOUNTER — Ambulatory Visit: Payer: Self-pay | Admitting: Nurse Practitioner

## 2021-07-01 MED ORDER — LEVOTHYROXINE SODIUM 125 MCG PO TABS: 125.0000 ug | ORAL_TABLET | Freq: Every day | ORAL | 0 refills | Status: AC

## 2021-07-01 NOTE — Telephone Encounter (Signed)
Routing to the provider.

## 2021-07-01 NOTE — Telephone Encounter (Signed)
Copied from [place] [id number]. Topic: General - Inquiry >> Jul 01, 2021  3:06 PM L[place] wrote: Reason for CRM: Pt husband just called pt appt @3 :00 can not make as had car trouble, reaching out as pt has no more levothyroxine (SYNTHROID) 125 MCG tablet 30 tablet 1 08/16/2020   Sig - Route: Take 1 tablet (125 mcg total) by mouth daily. - Oral Wondering if telephone call could be made instead as pt needs medication. Call husband Talmage at [phone]  Routing to the provider.

## 2021-07-02 NOTE — Chronic Care Management (AMB) (Signed)
  Care Management   Note  07/02/2021 Name: JOWANA THUMMA MRN: [id number] DOB: 06-Dec-1965  JOSE ALLEYNE is a 55 y.o. year old female who is a primary care patient of Venita Lick, NP and is actively engaged with the care management team. I reached out to Royanne Foots by phone today to assist with re-scheduling a follow up visit with the Licensed Clinical Social Worker  Follow up plan: Unsuccessful telephone outreach attempt made. A HIPAA compliant phone message was left for the patient providing contact information and requesting a return call.  The care management team will reach out to the patient again over the next 7 days.  If patient returns call to provider office, please advise to call [facility]  at [place], [place], [place], [place] [address] Direct Dial: [phone] [email or web address] Website: [email or web address]

## 2021-07-04 NOTE — Telephone Encounter (Signed)
LVM for patient to return call. 

## 2021-07-18 NOTE — Chronic Care Management (AMB) (Signed)
  Care Management   Note  07/18/2021 Name: [name] MRN: [id number] DOB: 05-07-1966  [name] is a 55 y.o. year old female who is a primary care patient of Venita Lick, NP and is actively engaged with the care management team. I reached out to Royanne Foots by phone today to assist with re-scheduling a follow up visit with the Licensed Clinical Social Worker  Follow up plan: Unable to make contact on outreach attempts x 3. PCP Venita Lick, NP notified via routed documentation in medical record.   Noreene Larsson, [place], [place], [place] [address] Direct Dial: [phone] [email or web address] Website: [email or web address]

## 2021-07-18 NOTE — Telephone Encounter (Signed)
3rd unsuccessful outreach  

## 2021-07-28 ENCOUNTER — Other Ambulatory Visit: Payer: Self-pay

## 2021-07-28 ENCOUNTER — Inpatient Hospital Stay
Admission: EM | Admit: 2021-07-28 | Discharge: 2021-07-30 | DRG: 374 | Discharge status: Against Medical Advice | Payer: Medicaid Other | Attending: Internal Medicine | Admitting: Internal Medicine

## 2021-07-28 ENCOUNTER — Encounter: Payer: Self-pay | Admitting: Emergency Medicine

## 2021-07-28 ENCOUNTER — Emergency Department: Payer: Medicaid Other

## 2021-07-28 DIAGNOSIS — K861 Other chronic pancreatitis: Secondary | ICD-10-CM | POA: Diagnosis present

## 2021-07-28 DIAGNOSIS — E039 Hypothyroidism, unspecified: Secondary | ICD-10-CM

## 2021-07-28 DIAGNOSIS — B1920 Unspecified viral hepatitis C without hepatic coma: Secondary | ICD-10-CM | POA: Diagnosis present

## 2021-07-28 DIAGNOSIS — D539 Nutritional anemia, unspecified: Secondary | ICD-10-CM | POA: Diagnosis present

## 2021-07-28 DIAGNOSIS — Z8261 Family history of arthritis: Secondary | ICD-10-CM | POA: Diagnosis not present

## 2021-07-28 DIAGNOSIS — Z5329 Procedure and treatment not carried out because of patient's decision for other reasons: Secondary | ICD-10-CM | POA: Diagnosis present

## 2021-07-28 DIAGNOSIS — R1310 Dysphagia, unspecified: Secondary | ICD-10-CM

## 2021-07-28 DIAGNOSIS — K219 Gastro-esophageal reflux disease without esophagitis: Secondary | ICD-10-CM | POA: Diagnosis present

## 2021-07-28 DIAGNOSIS — Z833 Family history of diabetes mellitus: Secondary | ICD-10-CM

## 2021-07-28 DIAGNOSIS — Z8249 Family history of ischemic heart disease and other diseases of the circulatory system: Secondary | ICD-10-CM | POA: Diagnosis not present

## 2021-07-28 DIAGNOSIS — E162 Hypoglycemia, unspecified: Secondary | ICD-10-CM | POA: Diagnosis present

## 2021-07-28 DIAGNOSIS — Z681 Body mass index (BMI) 19 or less, adult: Secondary | ICD-10-CM

## 2021-07-28 DIAGNOSIS — R7401 Elevation of levels of liver transaminase levels: Secondary | ICD-10-CM | POA: Diagnosis present

## 2021-07-28 DIAGNOSIS — F1020 Alcohol dependence, uncomplicated: Secondary | ICD-10-CM | POA: Diagnosis present

## 2021-07-28 DIAGNOSIS — R221 Localized swelling, mass and lump, neck: Secondary | ICD-10-CM | POA: Diagnosis not present

## 2021-07-28 DIAGNOSIS — Z79890 Hormone replacement therapy (postmenopausal): Secondary | ICD-10-CM

## 2021-07-28 DIAGNOSIS — F17200 Nicotine dependence, unspecified, uncomplicated: Secondary | ICD-10-CM | POA: Diagnosis present

## 2021-07-28 DIAGNOSIS — Z85850 Personal history of malignant neoplasm of thyroid: Secondary | ICD-10-CM | POA: Diagnosis not present

## 2021-07-28 DIAGNOSIS — Z880 Allergy status to penicillin: Secondary | ICD-10-CM

## 2021-07-28 DIAGNOSIS — Z79899 Other long term (current) drug therapy: Secondary | ICD-10-CM

## 2021-07-28 DIAGNOSIS — R1319 Other dysphagia: Secondary | ICD-10-CM | POA: Diagnosis not present

## 2021-07-28 DIAGNOSIS — F3181 Bipolar II disorder: Secondary | ICD-10-CM | POA: Diagnosis present

## 2021-07-28 DIAGNOSIS — K2289 Other specified disease of esophagus: Secondary | ICD-10-CM

## 2021-07-28 DIAGNOSIS — Z800 Family history of malignant neoplasm of digestive organs: Secondary | ICD-10-CM

## 2021-07-28 DIAGNOSIS — R634 Abnormal weight loss: Secondary | ICD-10-CM | POA: Diagnosis present

## 2021-07-28 DIAGNOSIS — E876 Hypokalemia: Secondary | ICD-10-CM | POA: Diagnosis present

## 2021-07-28 DIAGNOSIS — R042 Hemoptysis: Secondary | ICD-10-CM | POA: Diagnosis present

## 2021-07-28 DIAGNOSIS — J690 Pneumonitis due to inhalation of food and vomit: Secondary | ICD-10-CM | POA: Diagnosis present

## 2021-07-28 DIAGNOSIS — F17213 Nicotine dependence, cigarettes, with withdrawal: Secondary | ICD-10-CM

## 2021-07-28 DIAGNOSIS — C154 Malignant neoplasm of middle third of esophagus: Principal | ICD-10-CM | POA: Diagnosis present

## 2021-07-28 DIAGNOSIS — Z20822 Contact with and (suspected) exposure to covid-19: Secondary | ICD-10-CM | POA: Diagnosis present

## 2021-07-28 DIAGNOSIS — E890 Postprocedural hypothyroidism: Secondary | ICD-10-CM | POA: Diagnosis present

## 2021-07-28 DIAGNOSIS — F17210 Nicotine dependence, cigarettes, uncomplicated: Secondary | ICD-10-CM | POA: Diagnosis present

## 2021-07-28 LAB — COMPREHENSIVE METABOLIC PANEL
ALT: 41 U/L (ref 0–44)
AST: 151 U/L — ABNORMAL HIGH (ref 15–41)
Albumin: 4.8 g/dL (ref 3.5–5.0)
Alkaline Phosphatase: 53 U/L (ref 38–126)
Anion gap: 10 (ref 5–15)
BUN: 18 mg/dL (ref 6–20)
CO2: 24 mmol/L (ref 22–32)
Calcium: 9.3 mg/dL (ref 8.9–10.3)
Chloride: 102 mmol/L (ref 98–111)
Creatinine, Ser: 1 mg/dL (ref 0.44–1.00)
GFR, Estimated: >60 mL/min (ref >60)
Glucose, Bld: 67 mg/dL — ABNORMAL LOW (ref 70–99)
Potassium: 2.9 mmol/L — ABNORMAL LOW (ref 3.5–5.1)
Sodium: 136 mmol/L (ref 135–145)
Total Bilirubin: 1.4 mg/dL — ABNORMAL HIGH (ref 0.3–1.2)
Total Protein: 10 g/dL — ABNORMAL HIGH (ref 6.5–8.1)

## 2021-07-28 LAB — CBG MONITORING, ED
Glucose-Capillary: 63 mg/dL — ABNORMAL LOW (ref 70–99)
Glucose-Capillary: 169 mg/dL — ABNORMAL HIGH (ref 70–99)

## 2021-07-28 LAB — CBC WITH DIFFERENTIAL/PLATELET
Abs Immature Granulocytes: 0.03 10*3/uL (ref 0.00–0.07)
Basophils Absolute: 0 10*3/uL (ref 0.0–0.1)
Basophils Relative: 0 %
Eosinophils Absolute: 0 10*3/uL (ref 0.0–0.5)
Eosinophils Relative: 0 %
HCT: 36.3 % (ref 36.0–46.0)
Hemoglobin: 11.9 g/dL — ABNORMAL LOW (ref 12.0–15.0)
Immature Granulocytes: 0 %
Lymphocytes Relative: 10 %
Lymphs Abs: 0.8 10*3/uL (ref 0.7–4.0)
MCH: 34.2 pg — ABNORMAL HIGH (ref 26.0–34.0)
MCHC: 32.8 g/dL (ref 30.0–36.0)
MCV: 104.3 fL — ABNORMAL HIGH (ref 80.0–100.0)
Monocytes Absolute: 0.2 10*3/uL (ref 0.1–1.0)
Monocytes Relative: 3 %
Neutro Abs: 6.7 10*3/uL (ref 1.7–7.7)
Neutrophils Relative %: 87 %
Platelets: 407 10*3/uL — ABNORMAL HIGH (ref 150–400)
RBC: 3.48 MIL/uL — ABNORMAL LOW (ref 3.87–5.11)
RDW: 12.9 % (ref 11.5–15.5)
WBC: 7.7 10*3/uL (ref 4.0–10.5)
nRBC: 0 % (ref 0.0–0.2)

## 2021-07-28 LAB — RESP PANEL BY RT-PCR (FLU A&B, COVID) ARPGX2
Influenza A by PCR: NEGATIVE
Influenza B by PCR: NEGATIVE
SARS Coronavirus 2 by RT PCR: NEGATIVE

## 2021-07-28 LAB — MAGNESIUM: Magnesium: 2.6 mg/dL — ABNORMAL HIGH (ref 1.7–2.4)

## 2021-07-28 LAB — LIPASE, BLOOD: Lipase: 26 U/L (ref 11–51)

## 2021-07-28 LAB — ETHANOL: Alcohol, Ethyl (B): <10 mg/dL (ref <10)

## 2021-07-28 MED ORDER — LORAZEPAM 1 MG PO TABS
1.0000 mg | ORAL_TABLET | ORAL | Status: DC | PRN
  Filled 2021-07-28: qty 1

## 2021-07-28 MED ORDER — DEXTROSE-NACL 10-0.45 % IV SOLN
INTRAVENOUS | Status: DC
Start: 2021-07-28 — End: 2021-07-28
  Filled 2021-07-28 (×2): qty 1000

## 2021-07-28 MED ORDER — ALBUTEROL SULFATE (2.5 MG/3ML) 0.083% IN NEBU: 2.5000 mg | INHALATION_SOLUTION | RESPIRATORY_TRACT | Status: DC | PRN

## 2021-07-28 MED ORDER — MORPHINE SULFATE (PF) 2 MG/ML IV SOLN
2.0000 mg | INTRAVENOUS | Status: DC | PRN
  Administered 2021-07-28 – 2021-07-29 (×3): 2 mg via INTRAVENOUS
  Filled 2021-07-28 (×3): qty 1

## 2021-07-28 MED ORDER — LORAZEPAM 2 MG/ML IJ SOLN: 0.0000 mg | Freq: Two times a day (BID) | INTRAMUSCULAR | Status: DC

## 2021-07-28 MED ORDER — DEXTROSE 50 % IV SOLN
50.0000 mL | Freq: Once | INTRAVENOUS | Status: AC
  Administered 2021-07-28: 50 mL via INTRAVENOUS
  Filled 2021-07-28: qty 50

## 2021-07-28 MED ORDER — LORAZEPAM 2 MG/ML IJ SOLN
1.0000 mg | INTRAMUSCULAR | Status: DC | PRN
  Administered 2021-07-28: 2 mg via INTRAVENOUS
  Administered 2021-07-28: 1 mg via INTRAVENOUS
  Administered 2021-07-29 (×2): 2 mg via INTRAVENOUS
  Administered 2021-07-30: 1 mg via INTRAVENOUS
  Filled 2021-07-28 (×2): qty 1

## 2021-07-28 MED ORDER — METRONIDAZOLE 500 MG/100ML IV SOLN
500.0000 mg | Freq: Two times a day (BID) | INTRAVENOUS | Status: DC
  Administered 2021-07-28 – 2021-07-29 (×4): 500 mg via INTRAVENOUS
  Filled 2021-07-28 (×6): qty 100

## 2021-07-28 MED ORDER — SODIUM CHLORIDE 0.9 % IV SOLN
1.0000 g | INTRAVENOUS | Status: DC
  Administered 2021-07-29: 1 g via INTRAVENOUS
  Filled 2021-07-28 (×2): qty 10

## 2021-07-28 MED ORDER — SODIUM CHLORIDE 0.9 % IV SOLN
1.0000 g | Freq: Once | INTRAVENOUS | Status: AC
  Administered 2021-07-28: 1 g via INTRAVENOUS
  Filled 2021-07-28: qty 10

## 2021-07-28 MED ORDER — ONDANSETRON HCL 4 MG/2ML IJ SOLN
4.0000 mg | Freq: Four times a day (QID) | INTRAMUSCULAR | Status: DC | PRN
  Administered 2021-07-28 – 2021-07-29 (×3): 4 mg via INTRAVENOUS
  Filled 2021-07-28 (×4): qty 2

## 2021-07-28 MED ORDER — ONDANSETRON HCL 4 MG PO TABS: 4.0000 mg | ORAL_TABLET | Freq: Four times a day (QID) | ORAL | Status: DC | PRN

## 2021-07-28 MED ORDER — LORAZEPAM 2 MG/ML IJ SOLN
0.0000 mg | Freq: Four times a day (QID) | INTRAMUSCULAR | Status: AC
  Administered 2021-07-28: 1 mg via INTRAVENOUS
  Administered 2021-07-29: 2 mg via INTRAVENOUS
  Filled 2021-07-28 (×5): qty 1

## 2021-07-28 MED ORDER — SODIUM CHLORIDE 0.9 % IV SOLN
1.0000 mg | Freq: Every day | INTRAVENOUS | Status: DC
  Administered 2021-07-29: 1 mg via INTRAVENOUS
  Filled 2021-07-28 (×2): qty 0.2

## 2021-07-28 MED ORDER — POTASSIUM CHLORIDE 10 MEQ/100ML IV SOLN
10.0000 meq | INTRAVENOUS | Status: DC
  Filled 2021-07-28: qty 100

## 2021-07-28 MED ORDER — NICOTINE 14 MG/24HR TD PT24
14.0000 mg | MEDICATED_PATCH | Freq: Every day | TRANSDERMAL | Status: DC
  Administered 2021-07-28 – 2021-07-29 (×2): 14 mg via TRANSDERMAL
  Filled 2021-07-28 (×2): qty 1

## 2021-07-28 MED ORDER — PANTOPRAZOLE SODIUM 40 MG IV SOLR
40.0000 mg | INTRAVENOUS | Status: DC
  Administered 2021-07-28 – 2021-07-29 (×2): 40 mg via INTRAVENOUS
  Filled 2021-07-28 (×2): qty 40

## 2021-07-28 MED ORDER — THIAMINE HCL 100 MG/ML IJ SOLN
100.0000 mg | Freq: Every day | INTRAMUSCULAR | Status: DC
  Administered 2021-07-29: 100 mg via INTRAVENOUS
  Filled 2021-07-28: qty 2

## 2021-07-28 MED ORDER — ONDANSETRON HCL 4 MG/2ML IJ SOLN
4.0000 mg | Freq: Once | INTRAMUSCULAR | Status: AC
  Administered 2021-07-28: 4 mg via INTRAVENOUS
  Filled 2021-07-28: qty 2

## 2021-07-28 MED ORDER — SODIUM CHLORIDE 0.9 % IV SOLN
500.0000 mg | Freq: Once | INTRAVENOUS | Status: AC
  Administered 2021-07-28: 500 mg via INTRAVENOUS
  Filled 2021-07-28: qty 500

## 2021-07-28 MED ORDER — THIAMINE HCL 100 MG/ML IJ SOLN
INTRAVENOUS | Status: AC
  Filled 2021-07-28 (×2): qty 1000

## 2021-07-28 MED ORDER — MORPHINE SULFATE (PF) 4 MG/ML IV SOLN
4.0000 mg | Freq: Once | INTRAVENOUS | Status: AC
  Administered 2021-07-28: 4 mg via INTRAVENOUS
  Filled 2021-07-28: qty 1

## 2021-07-28 MED ORDER — THIAMINE HCL 100 MG/ML IJ SOLN: 100.0000 mg | Freq: Every day | INTRAMUSCULAR | Status: DC

## 2021-07-28 MED ORDER — IOHEXOL 300 MG/ML  SOLN
75.0000 mL | Freq: Once | INTRAMUSCULAR | Status: AC | PRN
  Administered 2021-07-28: 75 mL via INTRAVENOUS

## 2021-07-28 MED ORDER — POTASSIUM CHLORIDE 2 MEQ/ML IV SOLN
INTRAVENOUS | Status: DC
Start: 2021-07-28 — End: 2021-07-28
  Filled 2021-07-28 (×2): qty 1000

## 2021-07-28 MED ORDER — DEXTROSE-NACL 10-0.45 % IV SOLN
INTRAVENOUS | Status: DC
  Filled 2021-07-28 (×5): qty 1000

## 2021-07-28 NOTE — ED Notes (Signed)
Pt and family given three pillows. Pt was was in bed asking for meds. Family given new chair.

## 2021-07-28 NOTE — ED Notes (Signed)
Pt with noted episode of coughing up bright red blood. EDP made aware. Pt states happens all the time. CBG rechecked by this RN, medications given. Pt moved to stretcher for CT.

## 2021-07-28 NOTE — ED Triage Notes (Signed)
First RN Note: Pt to ED via [facility] with c/o abd pain x 4 months with worsening x 4 days. Per EMS pt poor historian, states "something wrong with my tube". Per EMS pt with similar episodes over the last 10 yrs.    110/73 73Hr 97% RA

## 2021-07-28 NOTE — ED Provider Notes (Signed)
[facility] Emergency Department Provider Note   ____________________________________________   Event Date/Time   First MD Initiated Contact with Patient 07/28/21 0730     (approximate)  I have reviewed the triage vital signs and the nursing notes.   HISTORY  Chief Complaint Abdominal Pain    HPI [name] is a 55 y.o. female with past medical history of hypertension, COPD, hepatitis C, alcohol abuse, chronic pancreatitis, bipolar disorder who presents to the ED complaining of abdominal pain.  Patient reports that she has had approximately 6 months of near constant pain in her epigastrium, similar to prior episodes of pancreatitis.  She reports along with this, she has had increasing difficulty swallowing along with a feeling of swelling in her neck.  She states that whenever she tries to drink liquids or solids, she has to "spit a lot," and that the food will often come back up.  Over the past couple of weeks, she has been dealing with increasing cough with associated hemoptysis.  She endorses pain in her chest and difficulty breathing, denies any fevers.  She reports losing about 50 pounds over the past 2 months, has not yet seen a doctor for the symptoms.  She reports a remote history of "throat cancer," for which she had her thyroid removed 30 years ago.        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    Patient Active Problem List   Diagnosis Date Noted   Bipolar 2 disorder ([place]) 07/27/2018   Agoraphobia with panic disorder  07/27/2018   Chronic pain syndrome 07/20/2015   COPD (chronic obstructive pulmonary disease) ([place]) 07/20/2015   Hepatitis C 07/20/2015   Alcohol use disorder, severe, dependence ([place]) 03/22/2015   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 Authorizing Provider  albuterol (VENTOLIN HFA) 108 (90 Base) MCG/ACT inhaler Inhale 1-2 puffs into the lungs every 4 (four) hours as needed for wheezing or shortness of breath. 08/16/20   Johnson, Megan P, DO  levothyroxine (SYNTHROID) 125 MCG tablet Take 1 tablet (125 mcg total) by mouth daily. 07/01/21   Cannady, Henrine Screws T, NP  QUEtiapine (SEROQUEL XR) 50 MG TB24 24 hr tablet Take 1 tablet (50 mg total) by mouth at bedtime. Patient not taking: Reported on 03/22/2021 12/20/19   Kathrine Haddock, NP    Allergies Penicillins  Family History  Problem Relation Age of Onset   Diabetes Mellitus II Maternal Grandfather    CAD Maternal  Grandfather    Diabetes Mellitus II Mother    Liver cancer Mother    Arthritis Mother    Cancer Mother        lung   Cancer Father    Thyroid disease Son    Diabetes Mellitus II Maternal Aunt    Pancreatic cancer Paternal Aunt     Social History Social History   Tobacco Use   Smoking status: Every Day    Packs/day: 0.25    Types: Cigarettes   Smokeless tobacco: Never  Vaping Use   Vaping Use: Never used  Substance Use Topics   Alcohol use: Yes    Alcohol/week: 0.0 standard drinks    Comment: pt states she has a drink every day    Drug use: Yes    Types: Marijuana, Cocaine    Comment: History of drug use    Review of Systems  Constitutional: No fever/chills.  Positive for generalized weakness and weight loss. Eyes: No visual changes. ENT: No sore throat. Cardiovascular: Denies chest pain. Respiratory: Positive for cough and shortness of breath.  Positive for hemoptysis. Gastrointestinal: Positive for dysphagia.  Positive for abdominal pain, nausea, and vomiting.  No diarrhea.  No constipation. Genitourinary: Negative for dysuria. Musculoskeletal: Negative for back pain. Skin: Negative for rash. Neurological: Negative for headaches, focal weakness or numbness.  ____________________________________________   PHYSICAL EXAM:  VITAL SIGNS: ED Triage Vitals  Enc Vitals Group     BP 07/28/21 0137 (!) 120/92     Pulse Rate 07/28/21 0137 76     Resp 07/28/21 0137 (!) 24     Temp 07/28/21 0137 97.8 F (36.6 C)     Temp Source 07/28/21 0137 Oral     SpO2 07/28/21 0137 94 %     Weight 07/28/21 0135 90 lb (40.8 kg)     Height 07/28/21 0135 5\' 1"  (1.549 m)     Head Circumference --      Peak Flow --      Pain Score 07/28/21 0135 9     Pain Loc --      Pain Edu? --      Excl. in [place]? --     Constitutional: Alert and oriented.  Extremely thin and cachectic appearing. Eyes: Conjunctivae are normal. Head: Atraumatic. Nose: No congestion/rhinnorhea. Mouth/Throat: Mucous membranes are dry. Neck: Normal ROM Cardiovascular: Normal rate, regular rhythm. Grossly normal heart sounds.  2+ radial pulses bilaterally. Respiratory: Normal respiratory effort.  No retractions. Lungs CTAB.  Frequent small-volume hemoptysis noted. Gastrointestinal: Soft and diffusely tender to palpation with no rebound or guarding.  No distention. Genitourinary: deferred Musculoskeletal: No lower extremity tenderness nor edema. Neurologic:  Normal speech and language. No gross focal neurologic deficits are appreciated. Skin:  Skin is warm,  dry and intact. No rash noted. Psychiatric: Mood and affect are normal. Speech and behavior are normal.  ____________________________________________   LABS (all labs ordered are listed, but only abnormal results are displayed)  Labs Reviewed  CBC WITH DIFFERENTIAL/PLATELET - Abnormal; Notable for the following components:      Result Value   RBC 3.48 (*)    Hemoglobin 11.9 (*)    MCV 104.3 (*)    MCH 34.2 (*)    Platelets 407 (*)    All other components within normal limits  COMPREHENSIVE METABOLIC PANEL - Abnormal; Notable for the following components:   Potassium 2.9 (*)    Glucose, Bld 67 (*)    Total Protein 10.0 (*)    AST 151 (*)  Total Bilirubin 1.4 (*)    All other components within normal limits  CBG MONITORING, ED - Abnormal; Notable for the following components:   Glucose-Capillary 63 (*)    All other components within normal limits  RESP PANEL BY RT-PCR (FLU A&B, COVID) ARPGX2  LIPASE, BLOOD  URINALYSIS, ROUTINE W REFLEX MICROSCOPIC  MAGNESIUM  ETHANOL  TYPE AND SCREEN    PROCEDURES  Procedure(s) performed (including Critical Care):  Procedures   ____________________________________________   INITIAL IMPRESSION / ASSESSMENT AND PLAN / ED COURSE      55 year old female with past medical history of hypertension, hepatitis C, COPD, alcohol abuse, chronic pancreatitis, bipolar disorder who presents to the ED with a few months of worsening swelling in her neck associated with dysphagia, hemoptysis, shortness of breath, and significant weight loss.  CT imaging was obtained from triage unremarkable for large neck mass that is likely contributing to her dysphagia.  She seems to have significant difficulty swallowing either liquids or solids due to this obstruction, is now mildly hypoglycemic and hypokalemic.  We will replete potassium via IV, add on magnesium levels, and start patient on a D10 half NS drip.  CTA of chest is negative for PE but does show what is  likely an aspiration pneumonia and we will start Rocephin and azithromycin.  CT of abdomen/pelvis is negative for acute process and patient's abdominal pain likely due to chronic pancreatitis.  No pseudocyst or necrosis noted.  Plan to discuss with hospitalist for admission to address her aspiration pneumonia along with new diagnosis of likely throat malignancy.      ____________________________________________   FINAL CLINICAL IMPRESSION(S) / ED DIAGNOSES  Final diagnoses:  Neck mass  Dysphagia, unspecified type  Hemoptysis  Aspiration pneumonia of right lung, unspecified aspiration pneumonia type, unspecified part of lung [facility])     ED Discharge Orders     None        Note:  This document was prepared using Dragon voice recognition software and may include unintentional dictation errors.    Blake Divine, MD 07/28/21 [phone]

## 2021-07-28 NOTE — Progress Notes (Signed)
Chaplain Pina Sirianni responded to a page for ED-18. Conducted one AD education for Ms. [name]. One family member by her bedside.

## 2021-07-28 NOTE — H&P (Addendum)
History and Physical    [name] NOM:[id number] DOB: [birth year] DOA: 07/28/2021  PCP: Venita Lick, NP   Patient coming from: Home  I have personally briefly reviewed patient's old medical records in [place]  Chief Complaint: Difficulty swallowing  HPI: [name] is a 55 y.o. female with medical history significant for chronic pancreatitis, alcohol abuse, bipolar disorder, hepatitis C, nicotine dependence, COPD who presents to the emergency room for evaluation of difficulty swallowing and inability to tolerate oral intake. Patient states that she had a thyroidectomy over 30 years ago and in the last couple of months has noticed a lump in her neck which has increased in size over the last couple of months.  She and her significant other state that she has had difficulty swallowing for months initially with solids but now even with liquids.  She has had significant weight loss at least over 30 pounds in the last 2 months.  Patient states that every time she tries to eat or drink anything it comes back up.  She also complains of coughing up bloody sputum for the last couple of days but denies having any fever or chills.  She denies having shortness of breath or any chest pain. She complains of pain in her epigastrium similar to her prior episodes of pancreatitis.   She admits to continued alcohol use and her last use was 4 days prior to her admission.  She admits to having symptoms of alcohol withdrawal when she does not drink. She has also had bright red blood per rectum and admits to having hemorrhoids as well as chronic constipation.  She also complains of generalized weakness. She denies having any headache, no dizziness, no lightheadedness, no palpitations, no leg swelling, no urinary symptoms, no blurred vision. Labs show sodium 136, potassium 2.9, chloride 102, bicarb 24, glucose 67, BUN 18, creatinine 1.0, calcium 9.3, alkaline phosphatase 53, albumin 4.8, lipase 26, AST  151, ALT 41, total protein 10.0, white count 7.7, hemoglobin 11.9, hematocrit 36.3, MCV 104.3, RDW 12.9, platelets 407 CT scan of the neck shows nearly 7 cm mass at the thoracic inlet, favor locally advanced esophageal cancer with encasement of major vessels. The mass approaches the skin surface and may be amenable to percutaneous biopsy. ingle mildly enlarged left level 4 lymph node. Chest CTA /abdominal CT shows large mass at the thoracic inlet, reference neck CT. Extensive debris in right-sided airways with right upper lobe opacity likely reflecting pneumonia or aspiration. 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   ED Course: Patient is a 55 year old female who presents to the ER for evaluation of several month history of worsening dysphagia initially with solids but now with liquids and inability to keep any food or liquids down. She has had significant weight loss and now has hemoptysis. Imaging shows a large mass at the thoracic inlet with extensive debris in the right-sided airways and right upper lobe opacity likely reflecting pneumonia or aspiration. Labs show significant hypokalemia. She will be admitted to the hospital for further evaluation.  Review of Systems: As per HPI otherwise all other systems reviewed and negative.    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 She has been smoking an average of .25 packs per day. She has never used smokeless tobacco. She reports current alcohol use. She reports current drug use. Drugs: Marijuana and Cocaine.  Allergies  Allergen Reactions   Penicillins Rash and Other (See Comments)    Has patient had a PCN reaction causing immediate rash, facial/tongue/throat swelling, SOB or lightheadedness with hypotension: No Has patient had a PCN reaction causing severe rash involving mucus membranes or skin necrosis: No Has patient had a PCN reaction that required hospitalization No Has patient had a PCN reaction occurring within the last 10 years: No If all of the above answers are "NO", then may proceed with Cephalosporin use.    Family History  Problem Relation Age of Onset   Diabetes Mellitus II Maternal Grandfather    CAD Maternal Grandfather    Diabetes Mellitus II Mother    Liver cancer Mother    Arthritis Mother    Cancer Mother        lung   Cancer Father    Thyroid disease Son    Diabetes Mellitus II Maternal Aunt    Pancreatic cancer Paternal Aunt       Prior to Admission medications   Medication Sig Start Date End Date Taking? Authorizing Provider  albuterol (VENTOLIN HFA) 108 (90 Base) MCG/ACT inhaler Inhale 1-2 puffs into the lungs every 4 (four) hours as needed for wheezing or shortness of breath. 08/16/20   Johnson, Megan P, DO  levothyroxine (SYNTHROID) 125 MCG tablet Take 1 tablet (125 mcg total) by mouth daily. 07/01/21   Cannady, Henrine Screws T, NP  QUEtiapine (SEROQUEL XR) 50 MG TB24 24 hr tablet Take 1 tablet (50 mg total) by mouth at bedtime. Patient not taking: Reported on 03/22/2021 12/20/19   Kathrine Haddock, NP    Physical Exam: Vitals:   07/28/21 0135 07/28/21 0137 07/28/21 0439 07/28/21 0740  BP:  (!) 120/92 127/86 122/82  Pulse:  76 77 70  Resp:  (!) 24 20 16   Temp:  97.8 F (36.6 C)    TempSrc:  Oral    SpO2:  94% 93% 93%  Weight: 40.8 kg      Height: 5\' 1"  (1.549 m)        Vitals:   07/28/21 0135 07/28/21 0137 07/28/21 0439 07/28/21 0740  BP:  (!) 120/92 127/86 122/82  Pulse:  76 77 70  Resp:  (!) 24 20 16   Temp:  97.8 F (36.6 C)    TempSrc:  Oral    SpO2:  94% 93% 93%  Weight: 40.8 kg     Height: 5\' 1"  (1.549 m)         Constitutional: Alert and oriented x 3 .  Appears anxious.  Chronically ill-appearing.  Looks older than stated age 33:      Head: Normocephalic and atraumatic.         Eyes: PERLA, EOMI, Conjunctivae pall.or. Sclera is non-icteric.       Mouth/Throat: Mucous membranes are moist.  Neck: Supple with no signs of meningismus. Cardiovascular: Regular rate and rhythm. No murmurs, gallops, or rubs. 2+ symmetrical distal pulses are present . No JVD. No LE edema Respiratory: Tachypnea.rhonchi over right upper and middle lobes.  Scattered wheezes  Abdomen: Firm, tender in the epigastrium and periumbilical area, nondistended Musculoskeletal: Nontender with normal range of motion in all extremities. No cyanosis, or erythema of extremities. Neurologic:  Face is symmetric. Moving all extremities. No gross focal neurologic deficits .  Generalized weakness Skin: Skin is warm, dry.  No rash or ulcers Psychiatric: Anxious   Labs on Admission: I have personally reviewed following labs and imaging studies  CBC: Recent Labs  Lab 07/27/21 0144  WBC 7.7  NEUTROABS 6.7  HGB 11.9*  HCT 36.3  MCV 104.3*  PLT 330*   Basic Metabolic Panel: Recent Labs  Lab 07/27/21 0144  NA 136  K 2.9*  CL 102  CO2 24  GLUCOSE 67*  BUN 18  CREATININE 1.00  CALCIUM 9.3   GFR: Estimated Creatinine Clearance: 40.5 mL/min (by C-G formula based on SCr of 1 mg/dL). Liver Function Tests: Recent Labs  Lab 07/27/21 0144  AST 151*  ALT 41  ALKPHOS 53  BILITOT 1.4*  PROT 10.0*  ALBUMIN 4.8   Recent Labs  Lab 07/27/21 0144  LIPASE 26   No results for input(s): AMMONIA in the last 168 hours. Coagulation  Profile: No results for input(s): INR, PROTIME in the last 168 hours. Cardiac Enzymes: No results for input(s): CKTOTAL, CKMB, CKMBINDEX, TROPONINI in the last 168 hours. BNP (last 3 results) No results for input(s): PROBNP in the last 8760 hours. HbA1C: No results for input(s): HGBA1C in the last 72 hours. CBG: Recent Labs  Lab 07/28/21 0505  GLUCAP 63*   Lipid Profile: No results for input(s): CHOL, HDL, LDLCALC, TRIG, CHOLHDL, LDLDIRECT in the last 72 hours. Thyroid Function Tests: No results for input(s): TSH, T4TOTAL, FREET4, T3FREE, THYROIDAB in the last 72 hours. Anemia Panel: No results for input(s): VITAMINB12, FOLATE, FERRITIN, TIBC, IRON, RETICCTPCT in the last 72 hours. Urine analysis:    Component Value Date/Time   COLORURINE STRAW (A) 12/07/2015 0020   APPEARANCEUR CLEAR (A) 12/07/2015 0020   APPEARANCEUR Clear 12/27/2013 0847   LABSPEC 1.005 12/07/2015 0020   LABSPEC 1.006 12/27/2013 0847   PHURINE 7.0 12/07/2015 0020   GLUCOSEU NEGATIVE 12/07/2015 0020   GLUCOSEU Negative 12/27/2013 0847   HGBUR NEGATIVE 12/07/2015 0020   BILIRUBINUR NEGATIVE 12/07/2015 0020   BILIRUBINUR Negative 12/27/2013 0847   KETONESUR NEGATIVE 12/07/2015 0020   PROTEINUR NEGATIVE 12/07/2015 0020   NITRITE NEGATIVE 12/07/2015 0020   LEUKOCYTESUR NEGATIVE 12/07/2015 0020   LEUKOCYTESUR Negative 12/27/2013 0847    Radiological Exams on Admission: CT Soft Tissue Neck W Contrast  Result Date: 07/28/2021 CLINICAL DATA:  Abdominal pain for 4 months, worsening. History of alcohol abuse. Neck abscess, deep tissue. EXAM: CT NECK WITH CONTRAST TECHNIQUE: Multidetector CT imaging of the neck was performed using the standard protocol following the bolus administration of intravenous contrast. CONTRAST:  68mL OMNIPAQUE IOHEXOL 300 MG/ML  SOLN COMPARISON:  None. FINDINGS: Pharynx, larynx, and esophagus: At the level of the thoracic inlet is a large mass measuring up to 6.7 cm, displacing the  carotid sheath anteriorly, the trachea anteriorly and leftward, in the esophagus leftward. Margins with the right wall of the esophagus are indistinct and there is a gas channel in the region of the expected esophageal wall. History of abscess but the mass appears more  solid and necrotic appearing with limited regional fat edema and encasement of vessels including the right proximal vertebral artery, right common carotid, and proximal right subclavian. Burtis Junes an exophytic esophageal mass. Salivary glands: No inflammation, mass, or stone. Thyroid: Atrophic if not resected. Lymph nodes: 10 mm left level 4 lymph node. Vascular: Vessel encasement as above. No acute vascular finding. Atheromatous calcification. Limited intracranial: Negative Visualized orbits: Negative Mastoids and visualized paranasal sinuses: Clear Skeleton: Cervical spine degeneration Upper chest: Apical emphysema. IMPRESSION: 1. Nearly 7 cm mass at the thoracic inlet, favor locally advanced esophageal cancer with encasement of major vessels. The mass approaches the skin surface and may be amenable to percutaneous biopsy. 2. Single mildly enlarged left level 4 lymph node. Electronically Signed   By: Jorje Guild M.D.   On: 07/28/2021 05:48   CT Angio Chest PE W and/or Wo Contrast  Addendum Date: 07/28/2021   ADDENDUM REPORT: 07/28/2021 06:35 ADDENDUM: Omitted impression #3 of ascending aortic aneurysm (measuring 4.1 cm in diameter) significance to be determined by workup of the patient's neck mass. Electronically Signed   By: Jorje Guild M.D.   On: 07/28/2021 06:35   Result Date: 07/28/2021 CLINICAL DATA:  Abdominal distension in the left upper quadrant. Abdominal pain. EXAM: CT ANGIOGRAPHY CHEST CT ABDOMEN AND PELVIS WITH CONTRAST TECHNIQUE: Multidetector CT imaging of the chest was performed using the standard protocol during bolus administration of intravenous contrast. Multiplanar CT image reconstructions and MIPs were obtained to  evaluate the vascular anatomy. Multidetector CT imaging of the abdomen and pelvis was performed using the standard protocol during bolus administration of intravenous contrast. CONTRAST:  35mL OMNIPAQUE IOHEXOL 300 MG/ML  SOLN COMPARISON:  Abdominal CT 11/28/2015 FINDINGS: CTA CHEST FINDINGS Cardiovascular: Cardiomegaly. Small to moderate low-density pericardial effusion measuring up to 11 mm in thickness. Aneurysmal ascending aorta measuring up to 4.1 cm in diameter. Mediastinum/Nodes: Mass at the thoracic inlet and upper mediastinum as described on dedicated neck CT no lymphadenopathy seen in the mediastinum. Lungs/Pleura: Airway thickening with debris in the right sided airways there is associated with airspace opacity in the inferior and posterior right upper lobe. Mild atelectasis in the medial right middle lobe and lingula. Musculoskeletal: Scoliosis. Review of the MIP images confirms the above findings. CT ABDOMEN and PELVIS FINDINGS Hepatobiliary: No focal liver abnormality.Prominence of intrahepatic bile ducts. No calcified cholelithiasis. Pancreas: Unremarkable. Spleen: Unremarkable. Adrenals/Urinary Tract: Negative adrenals. No hydronephrosis or stone. Unremarkable bladder. Stomach/Bowel:  No obstruction. No appendicitis. 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 There is also chronic occlusion of the distal splenic vein. Numerous upper abdominal collaterals. Atheromatous calcification of the aorta and iliacs. No mass or adenopathy. Reproductive:No pathologic findings. Other: No ascites or pneumoperitoneum. Musculoskeletal: No acute abnormalities. Review of the MIP images confirms the above findings. IMPRESSION: Chest CTA: 1. Large mass at the thoracic inlet, reference neck CT. 2. Extensive debris in right-sided airways with right upper lobe opacity likely reflecting pneumonia or aspiration. Abdominal CT: 1. No acute finding. 2. Chronic venous occlusions in the portal  system with well-formed collaterals/varices. Electronically Signed: By: Jorje Guild M.D. On: 07/28/2021 05:56   CT ABDOMEN PELVIS W CONTRAST  Addendum Date: 07/28/2021   ADDENDUM REPORT: 07/28/2021 06:35 ADDENDUM: Omitted impression #3 of ascending aortic aneurysm (measuring 4.1 cm in diameter) significance to be determined by workup of the patient's neck mass. Electronically Signed   By: Jorje Guild M.D.   On: 07/28/2021 06:35   Result Date: 07/28/2021 CLINICAL DATA:  Abdominal distension  in the left upper quadrant. Abdominal pain. EXAM: CT ANGIOGRAPHY CHEST CT ABDOMEN AND PELVIS WITH CONTRAST TECHNIQUE: Multidetector CT imaging of the chest was performed using the standard protocol during bolus administration of intravenous contrast. Multiplanar CT image reconstructions and MIPs were obtained to evaluate the vascular anatomy. Multidetector CT imaging of the abdomen and pelvis was performed using the standard protocol during bolus administration of intravenous contrast. CONTRAST:  12mL OMNIPAQUE IOHEXOL 300 MG/ML  SOLN COMPARISON:  Abdominal CT 11/28/2015 FINDINGS: CTA CHEST FINDINGS Cardiovascular: Cardiomegaly. Small to moderate low-density pericardial effusion measuring up to 11 mm in thickness. Aneurysmal ascending aorta measuring up to 4.1 cm in diameter. Mediastinum/Nodes: Mass at the thoracic inlet and upper mediastinum as described on dedicated neck CT no lymphadenopathy seen in the mediastinum. Lungs/Pleura: Airway thickening with debris in the right sided airways there is associated with airspace opacity in the inferior and posterior right upper lobe. Mild atelectasis in the medial right middle lobe and lingula. Musculoskeletal: Scoliosis. Review of the MIP images confirms the above findings. CT ABDOMEN and PELVIS FINDINGS Hepatobiliary: No focal liver abnormality.Prominence of intrahepatic bile ducts. No calcified cholelithiasis. Pancreas: Unremarkable. Spleen: Unremarkable.  Adrenals/Urinary Tract: Negative adrenals. No hydronephrosis or stone. Unremarkable bladder. Stomach/Bowel:  No obstruction. No appendicitis. 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 There is also chronic occlusion of the distal splenic vein. Numerous upper abdominal collaterals. Atheromatous calcification of the aorta and iliacs. No mass or adenopathy. Reproductive:No pathologic findings. Other: No ascites or pneumoperitoneum. Musculoskeletal: No acute abnormalities. Review of the MIP images confirms the above findings. IMPRESSION: Chest CTA: 1. Large mass at the thoracic inlet, reference neck CT. 2. Extensive debris in right-sided airways with right upper lobe opacity likely reflecting pneumonia or aspiration. Abdominal CT: 1. No acute finding. 2. 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 Electronically Signed: By: Jorje Guild M.D. On: 07/28/2021 05:56     Assessment/Plan Principal Problem:   Aspiration pneumonia (HCC) Active Problems:   Chronic pancreatitis (HCC)   Hypothyroidism   Alcohol use disorder, severe, dependence (HCC)   Hepatitis C   Nicotine dependence   Bipolar 2 disorder (HCC)   Hypoglycemia   Transaminitis   Dysphagia   Neck mass   Hypokalemia    Patient is a 55 year old female who presents to the ER for evaluation of worsening dysphagia, weight loss and hemoptysis.    Aspiration pneumonia Secondary to worsening dysphagia and emesis Patient presents with hemoptysis and imaging shows extensive debris in the right sided airways with right upper lobe opacity. We will keep patient n.p.o. for now Place patient empirically on IV antibiotic therapy with Rocephin and Flagyl Follow-up results of blood cultures Strict aspiration precaution     Neck mass/Dysphagia Patient has a mass in the anterior neck which has increased in size over months. She presents to the ER for evaluation of  progressively worsening dysphagia both with liquids and solids Imaging shows nearly 7 cm mass at the thoracic inlet, favor locally advanced esophageal cancer with encasement of major vessels. The mass approaches the skin surface and may be amenable to percutaneous biopsy. Will request IR consult in a.m. for biopsy as well as evaluation for possible PEG tube insertion since patient is unable to take p.o.     History of alcohol abuse Patient is at risk of developing symptoms of alcohol withdrawal Will place patient on lorazepam and administer for CIWA score of 8 or greater Place patient on thiamine, MVI and folic acid  Hypoglycemia Secondary to poor oral intake as well as history of chronic pancreatitis Place patient on dextrose containing fluids Check blood sugar every 4 hours     Nicotine dependence Smoking cessation has been discussed with patient in detail (5 minutes) Will place patient on a nicotine transdermal patch     Transaminitis Most likely secondary to history of alcohol abuse as well as hepatitis C Monitor closely during this admission    Hypokalemia Secondary to nausea and vomiting Supplement potassium Check magnesium levels  DVT prophylaxis: SCD  Code Status: full code  Family Communication: Greater than 50% of time was spent discussing patient's condition and plan of care with her and her significant other at the bedside.  All questions and concerns have been addressed.  They verbalized understanding and agree with the plan.  She lists her significant other Xochilth Standish as her healthcare power of attorney. Disposition Plan: Back to previous home environment Consults called: Oncology/interventional radiology Status:At the time of admission, it appears that the appropriate admission status for this patient is inpatient. This is judged to be reasonable and necessary to provide the required intensity of service to ensure the patient's safety given the  presenting symptoms, physical exam findings, and initial radiographic and laboratory data in the context of their comorbid conditions. Patient requires inpatient status due to high intensity of service, high risk for further deterioration and high frequency of surveillance required.     Collier Bullock MD [facility]     07/28/2021, 9:47 AM

## 2021-07-28 NOTE — ED Notes (Signed)
RN went into pt room after multiple times that pt has called on call light. RN walked into room to find pt peacefully sleeping with eyes closed, non vomiting, respirations normal in NAD and husband at bedside. Pt did not wake up during the time RN was in room nor did pt talk to RN.

## 2021-07-28 NOTE — ED Provider Notes (Signed)
HPI: Pt is a 55 y.o. female who presents with complaints of abdomain pain  The patient p/w  pain LUQ severe. 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 The patient will be placed in the appropriate treatment space, once one is available, to complete the evaluation and treatment. I have discussed the plan of care with the patient and I have advised the patient that an ED physician or mid-level practitioner will reevaluate their condition after the test results have been received, as the results may give them additional insight into the type of treatment they may need.   Diagnostics: CT   Treatments: none immediately   Vanessa Stanwood, MD 07/28/21 [phone]

## 2021-07-28 NOTE — ED Notes (Signed)
Pt sleeping in bed. Spouse ask to hold meds until she wakes

## 2021-07-28 NOTE — ED Triage Notes (Signed)
Pt noted to be extremely thin and pale during triage, constantly spitting during triage. Pt states hx of chronic pancreatitis, hep C. Pt with noted dried brown substance on her lips, states large weight loss recently of approx 50lbs but was never seen for it.

## 2021-07-29 ENCOUNTER — Inpatient Hospital Stay: Payer: Medicaid Other

## 2021-07-29 ENCOUNTER — Encounter: Payer: Self-pay | Admitting: Internal Medicine

## 2021-07-29 DIAGNOSIS — J690 Pneumonitis due to inhalation of food and vomit: Secondary | ICD-10-CM

## 2021-07-29 DIAGNOSIS — F1020 Alcohol dependence, uncomplicated: Secondary | ICD-10-CM

## 2021-07-29 DIAGNOSIS — R221 Localized swelling, mass and lump, neck: Secondary | ICD-10-CM

## 2021-07-29 DIAGNOSIS — R1319 Other dysphagia: Secondary | ICD-10-CM

## 2021-07-29 DIAGNOSIS — F3181 Bipolar II disorder: Secondary | ICD-10-CM

## 2021-07-29 DIAGNOSIS — E039 Hypothyroidism, unspecified: Secondary | ICD-10-CM

## 2021-07-29 LAB — BASIC METABOLIC PANEL
Anion gap: 6 (ref 5–15)
BUN: 13 mg/dL (ref 6–20)
CO2: 22 mmol/L (ref 22–32)
Calcium: 8.4 mg/dL — ABNORMAL LOW (ref 8.9–10.3)
Chloride: 107 mmol/L (ref 98–111)
Creatinine, Ser: 0.81 mg/dL (ref 0.44–1.00)
GFR, Estimated: >60 mL/min (ref >60)
Glucose, Bld: 151 mg/dL — ABNORMAL HIGH (ref 70–99)
Potassium: 3.6 mmol/L (ref 3.5–5.1)
Sodium: 135 mmol/L (ref 135–145)

## 2021-07-29 LAB — CBC
HCT: 29.5 % — ABNORMAL LOW (ref 36.0–46.0)
Hemoglobin: 9.5 g/dL — ABNORMAL LOW (ref 12.0–15.0)
MCH: 34.1 pg — ABNORMAL HIGH (ref 26.0–34.0)
MCHC: 32.2 g/dL (ref 30.0–36.0)
MCV: 105.7 fL — ABNORMAL HIGH (ref 80.0–100.0)
Platelets: 331 10*3/uL (ref 150–400)
RBC: 2.79 MIL/uL — ABNORMAL LOW (ref 3.87–5.11)
RDW: 13 % (ref 11.5–15.5)
WBC: 5.8 10*3/uL (ref 4.0–10.5)
nRBC: 0 % (ref 0.0–0.2)

## 2021-07-29 LAB — PROTIME-INR
INR: 1.1 (ref 0.8–1.2)
Prothrombin Time: 14.6 seconds (ref 11.4–15.2)

## 2021-07-29 LAB — GLUCOSE, CAPILLARY: Glucose-Capillary: 133 mg/dL — ABNORMAL HIGH (ref 70–99)

## 2021-07-29 LAB — CBG MONITORING, ED
Glucose-Capillary: 168 mg/dL — ABNORMAL HIGH (ref 70–99)
Glucose-Capillary: 132 mg/dL — ABNORMAL HIGH (ref 70–99)
Glucose-Capillary: 71 mg/dL (ref 70–99)

## 2021-07-29 LAB — HIV ANTIBODY (ROUTINE TESTING W REFLEX): HIV Screen 4th Generation wRfx: NONREACTIVE

## 2021-07-29 MED ORDER — FENTANYL CITRATE (PF) 100 MCG/2ML IJ SOLN
INTRAMUSCULAR | Status: AC
  Filled 2021-07-29: qty 2

## 2021-07-29 MED ORDER — FENTANYL CITRATE PF 50 MCG/ML IJ SOSY
12.5000 ug | PREFILLED_SYRINGE | INTRAMUSCULAR | Status: DC | PRN
  Administered 2021-07-29 (×2): 12.5 ug via INTRAVENOUS
  Filled 2021-07-29 (×2): qty 1

## 2021-07-29 MED ORDER — MIDAZOLAM HCL 2 MG/2ML IJ SOLN
INTRAMUSCULAR | Status: AC
  Filled 2021-07-29: qty 2

## 2021-07-29 MED ORDER — ENOXAPARIN SODIUM 40 MG/0.4ML IJ SOSY: 40.0000 mg | PREFILLED_SYRINGE | INTRAMUSCULAR | Status: DC

## 2021-07-29 MED ORDER — HYDROMORPHONE HCL 1 MG/ML IJ SOLN
0.5000 mg | INTRAMUSCULAR | Status: DC | PRN
  Administered 2021-07-29 – 2021-07-30 (×4): 0.5 mg via INTRAVENOUS
  Filled 2021-07-29 (×4): qty 1

## 2021-07-29 MED ORDER — FENTANYL CITRATE PF 50 MCG/ML IJ SOSY: 25.0000 ug | PREFILLED_SYRINGE | INTRAMUSCULAR | Status: DC | PRN

## 2021-07-29 NOTE — ED Notes (Signed)
Provider Rachael Fee, NP) contacted about the patients pain level regardless of morphine administration. View MAR for intervention.

## 2021-07-29 NOTE — ED Notes (Signed)
Message sent to pharmacy regarding needing new bag of fluids. Asked to send them to floor due to patient being moved to 137.

## 2021-07-29 NOTE — ED Notes (Addendum)
[place] attempted to pick up patient for biopsy. Patient refusing to go until husband is back form home. Patient's husband called and made aware. Husband states that he will be back in the next 15-30 minutes. Will call us at that time.

## 2021-07-29 NOTE — Consult Note (Signed)
Chief Complaint: Patient was seen in consultation today for esophageal mass at the request of [name]  Referring Physician(s): [name]  Supervising Physician: [name]  Patient Status: [name] - ED  History of Present Illness: [name] is a 55 y.o. female who presented to ED with complaints of abdominal pain and difficulty swallowing. Patient has a history of thyroid cancer s/p total thyroidectomy. CT imaging done revealed large 6.7 cm mass suspect esophageal mass concerning for malignancy. Request received for image guided biopsy. History today is obtained per chart review and from the patient's significant other, patient is agitated and does not cooperate with questions. Per the patient's significant other she has no known complications to sedation.   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 Authorizing Provider  albuterol (VENTOLIN HFA) 108 (90 Base) MCG/ACT inhaler Inhale 1-2 puffs into the lungs every 4 (four) hours as needed for wheezing or shortness of breath. Patient not taking: No sig reported 08/16/20   Park Liter P, Alvarado  levothyroxine (SYNTHROID) 125 MCG tablet Take 1 tablet (125 mcg total) by mouth daily. Patient not taking: No sig reported 07/01/21   Marnee Guarneri T, NP  QUEtiapine (SEROQUEL XR) 50 MG TB24 24 hr tablet Take 1 tablet (50 mg total) by mouth at bedtime. Patient not taking: No sig reported 12/20/19   Kathrine Haddock, NP     Family History  Problem Relation Age of Onset   Diabetes Mellitus II Maternal Grandfather    CAD Maternal Grandfather    Diabetes Mellitus II Mother    Liver cancer Mother    Arthritis Mother    Cancer Mother        lung   Cancer Father    Thyroid disease Son    Diabetes Mellitus II Maternal Aunt    Pancreatic cancer Paternal Aunt     Social History   Socioeconomic History   Marital status: Single    Spouse name: Not on file   Number of children: Not on file   Years of education: Not on file   Highest education level: Not on file  Occupational History   Not on file  Tobacco Use   Smoking status: Every Day    Packs/day: 0.25    Types: Cigarettes   Smokeless tobacco: Never  Vaping Use   Vaping Use: Never used  Substance and Sexual Activity   Alcohol use: Yes    Alcohol/week: 0.0 standard drinks    Comment: pt states she has a drink every day   Drug use: Yes    Types: Marijuana, Cocaine    Comment: History of drug use  Sexual activity: Not on file  Other Topics Concern   Not on file  Social History Narrative   Not on file   Social Determinants of Health   Financial Resource Strain: High Risk   Difficulty of Paying Living Expenses: Very hard  Food Insecurity: No Food Insecurity   Worried About Running Out of Food in the Last Year: Never true   Ran Out of Food in the Last Year: Never true  Transportation Needs: No Transportation Needs   Lack of Transportation (Medical): No   Lack of Transportation (Non-Medical): No  Physical Activity: Inactive   Days of Exercise per Week: 0 days   Minutes of Exercise per Session: 0 min  Stress: Stress Concern Present   Feeling of Stress : Very much  Social Connections: Socially Isolated   Frequency of Communication with Friends and Family: Never    Frequency of Social Gatherings with Friends and Family: Never   Attends Religious Services: Never   Marine scientist or Organizations: No   Attends Archivist Meetings: Never   Marital Status: Married   Review of Systems: A 12 point ROS discussed and pertinent positives are indicated in the HPI above.  All other systems are negative.  Review of Systems  Vital Signs: BP (!) 155/94   Pulse 80   Temp 98.3 F (36.8 C) (Oral)   Resp 17   Ht 5\' 1"  (1.549 m)   Wt 90 lb (40.8 kg)   LMP  (LMP Unknown)   SpO2 100%   BMI 17.01 kg/m   Physical Exam Constitutional:      Comments: Awake, appears agitated   Cardiovascular:     Rate and Rhythm: Normal rate.  Pulmonary:     Effort: Pulmonary effort is normal. No respiratory distress.  Skin:    General: Skin is warm and dry.    Imaging: CT Soft Tissue Neck W Contrast  Result Date: 07/28/2021 CLINICAL DATA:  Abdominal pain for 4 months, worsening. History of alcohol abuse. Neck abscess, deep tissue. EXAM: CT NECK WITH CONTRAST TECHNIQUE: Multidetector CT imaging of the neck was performed using the standard protocol following the bolus administration of intravenous contrast. CONTRAST:  43mL OMNIPAQUE IOHEXOL 300 MG/ML  SOLN COMPARISON:  None. FINDINGS: Pharynx, larynx, and esophagus: At the level of the thoracic inlet is a large mass measuring up to 6.7 cm, displacing the carotid sheath anteriorly, the trachea anteriorly and leftward, in the esophagus leftward. Margins with the right wall of the esophagus are indistinct and there is a gas channel in the region of the expected esophageal wall. History of abscess but the mass appears more solid and necrotic appearing with limited regional fat edema and encasement of vessels including the right proximal vertebral artery, right common carotid, and proximal right subclavian. Burtis Junes an exophytic esophageal mass. Salivary glands: No inflammation, mass, or stone. Thyroid: Atrophic if not  resected. Lymph nodes: 10 mm left level 4 lymph node. Vascular: Vessel encasement as above. No acute vascular finding. Atheromatous calcification. Limited intracranial: Negative Visualized orbits: Negative Mastoids and visualized paranasal sinuses: Clear Skeleton: Cervical spine degeneration Upper chest: Apical emphysema. IMPRESSION: 1. Nearly 7 cm mass at the thoracic inlet, favor locally advanced esophageal cancer with encasement of major vessels. The mass approaches the skin surface and may be amenable to percutaneous biopsy. 2. Single mildly enlarged left level 4 lymph node. Electronically Signed   By: Jorje Guild M.D.   On: 07/28/2021 05:48   CT Angio Chest PE W  and/or Wo Contrast  Addendum Date: 07/28/2021   ADDENDUM REPORT: 07/28/2021 06:35 ADDENDUM: Omitted impression #3 of ascending aortic aneurysm (measuring 4.1 cm in diameter) significance to be determined by workup of the patient's neck mass. Electronically Signed   By: Jorje Guild M.D.   On: 07/28/2021 06:35   Result Date: 07/28/2021 CLINICAL DATA:  Abdominal distension in the left upper quadrant. Abdominal pain. EXAM: CT ANGIOGRAPHY CHEST CT ABDOMEN AND PELVIS WITH CONTRAST TECHNIQUE: Multidetector CT imaging of the chest was performed using the standard protocol during bolus administration of intravenous contrast. Multiplanar CT image reconstructions and MIPs were obtained to evaluate the vascular anatomy. Multidetector CT imaging of the abdomen and pelvis was performed using the standard protocol during bolus administration of intravenous contrast. CONTRAST:  28mL OMNIPAQUE IOHEXOL 300 MG/ML  SOLN COMPARISON:  Abdominal CT 11/28/2015 FINDINGS: CTA CHEST FINDINGS Cardiovascular: Cardiomegaly. Small to moderate low-density pericardial effusion measuring up to 11 mm in thickness. Aneurysmal ascending aorta measuring up to 4.1 cm in diameter. Mediastinum/Nodes: Mass at the thoracic inlet and upper mediastinum as described on dedicated  neck CT no lymphadenopathy seen in the mediastinum. Lungs/Pleura: Airway thickening with debris in the right sided airways there is associated with airspace opacity in the inferior and posterior right upper lobe. Mild atelectasis in the medial right middle lobe and lingula. Musculoskeletal: Scoliosis. Review of the MIP images confirms the above findings. CT ABDOMEN and PELVIS FINDINGS Hepatobiliary: No focal liver abnormality.Prominence of intrahepatic bile ducts. No calcified cholelithiasis. Pancreas: Unremarkable. Spleen: Unremarkable. Adrenals/Urinary Tract: Negative adrenals. No hydronephrosis or stone. Unremarkable bladder. Stomach/Bowel:  No obstruction. No appendicitis. 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 There is also chronic occlusion of the distal splenic vein. Numerous upper abdominal collaterals. Atheromatous calcification of the aorta and iliacs. No mass or adenopathy. Reproductive:No pathologic findings. Other: No ascites or pneumoperitoneum. Musculoskeletal: No acute abnormalities. Review of the MIP images confirms the above findings. IMPRESSION: Chest CTA: 1. Large mass at the thoracic inlet, reference neck CT. 2. Extensive debris in right-sided airways with right upper lobe opacity likely reflecting pneumonia or aspiration. Abdominal CT: 1. No acute finding. 2. 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 Electronically Signed: By: Jorje Guild M.D. On: 07/28/2021 05:56   CT ABDOMEN PELVIS W CONTRAST  Addendum Date: 07/28/2021   ADDENDUM REPORT: 07/28/2021 06:35 ADDENDUM: Omitted impression #3 of ascending aortic aneurysm (measuring 4.1 cm in diameter) significance to be determined by workup of the patient's neck mass. Electronically Signed   By: Jorje Guild M.D.   On: 07/28/2021 06:35   Result Date: 07/28/2021 CLINICAL DATA:  Abdominal distension in the left upper quadrant. Abdominal pain. EXAM: CT  ANGIOGRAPHY CHEST CT ABDOMEN AND PELVIS WITH CONTRAST TECHNIQUE: Multidetector CT imaging of the chest was performed using the standard protocol during bolus administration of intravenous contrast. Multiplanar CT image reconstructions and MIPs were obtained to evaluate the vascular anatomy. Multidetector CT imaging of the abdomen and pelvis was performed using the standard protocol during bolus administration of intravenous contrast. CONTRAST:  69mL OMNIPAQUE IOHEXOL 300 MG/ML  SOLN COMPARISON:  Abdominal CT 11/28/2015 FINDINGS: CTA CHEST FINDINGS Cardiovascular: Cardiomegaly. Small to moderate low-density pericardial effusion measuring up to 11 mm in thickness. Aneurysmal ascending aorta measuring up to 4.1 cm in diameter. Mediastinum/Nodes: Mass at the thoracic inlet and upper mediastinum as described on dedicated neck CT no lymphadenopathy seen in the mediastinum. Lungs/Pleura: Airway thickening with debris in the right sided airways there is associated  with airspace opacity in the inferior and posterior right upper lobe. Mild atelectasis in the medial right middle lobe and lingula. Musculoskeletal: Scoliosis. Review of the MIP images confirms the above findings. CT ABDOMEN and PELVIS FINDINGS Hepatobiliary: No focal liver abnormality.Prominence of intrahepatic bile ducts. No calcified cholelithiasis. Pancreas: Unremarkable. Spleen: Unremarkable. Adrenals/Urinary Tract: Negative adrenals. No hydronephrosis or stone. Unremarkable bladder. Stomach/Bowel:  No obstruction. No appendicitis. 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 There is also chronic occlusion of the distal splenic vein. Numerous upper abdominal collaterals. Atheromatous calcification of the aorta and iliacs. No mass or adenopathy. Reproductive:No pathologic findings. Other: No ascites or pneumoperitoneum. Musculoskeletal: No acute abnormalities. Review of the MIP images confirms the above findings. IMPRESSION:  Chest CTA: 1. Large mass at the thoracic inlet, reference neck CT. 2. Extensive debris in right-sided airways with right upper lobe opacity likely reflecting pneumonia or aspiration. Abdominal CT: 1. No acute finding. 2. 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 Electronically Signed: By: Jorje Guild M.D. On: 07/28/2021 05:56    Labs:  CBC: Recent Labs    08/16/20 1613 07/27/21 0144 07/29/21 0629  WBC 5.3 7.7 5.8  HGB 13.6 11.9* 9.5*  HCT 40.0 36.3 29.5*  PLT 245 407* 331    COAGS: Recent Labs    07/29/21 0613  INR 1.1    BMP: Recent Labs    08/16/20 1613 07/27/21 0144 07/29/21 0629  NA 136 136 135  K 3.9 2.9* 3.6  CL 102 102 107  CO2 19* 24 22  GLUCOSE 86 67* 151*  BUN 14 18 13   CALCIUM 8.7 9.3 8.4*  CREATININE 0.96 1.00 0.81  GFRNONAA 67 >60 >60  GFRAA 77  --   --     LIVER FUNCTION TESTS: Recent Labs    08/16/20 1613 07/27/21 0144  BILITOT 0.5 1.4*  AST 51* 151*  ALT 32 41  ALKPHOS 74 53  PROT 7.9 10.0*  ALBUMIN 4.6 4.8    Assessment and Plan: 55 year old female who presented to ED with complaints of abdominal pain and difficulty swallowing. Patient has a history of thyroid cancer s/p total thyroidectomy. CT imaging done revealed large 6.7 cm mass suspect esophageal mass concerning for malignancy. Request received for image guided biopsy.   The patient has been NPO, no blood thinners taken, labs and vitals have been reviewed.  Risks and benefits of image guided esophageal mass biopsy was discussed with the patient and/or patient's family including, but not limited to bleeding, infection, damage to adjacent structures or low yield requiring additional tests.  All of the questions were answered and there is agreement to proceed.  Consent signed and in chart.   Thank you for this interesting consult.  I greatly enjoyed meeting KRISTAN BRUMMITT and look forward to participating in their care.  A copy of this  report was sent to the requesting provider on this date.  Electronically Signed: Hedy Jacob, PA-C 07/29/2021, 12:17 PM   I spent a total of 20 Minutes in face to face in clinical consultation, greater than 50% of which was counseling/coordinating care for esophageal mass.

## 2021-07-29 NOTE — Progress Notes (Signed)
PROGRESS NOTE  [name]:[id number] DOB: 04-13-1966 DOA: 07/28/2021 PCP: Venita Lick, NP  HPI/Recap of past 24 hours:  [name] is a 55 y.o. female with medical history significant for chronic pancreatitis, thyroidectomy over 30 years ago, alcohol abuse, bipolar disorder, hepatitis C, nicotine dependence, COPD who presents to [facility] ED due to difficulty swallowing and inability to tolerate oral intake, gradually worsening for the past 2 months.  Associated with 30 pound weight loss for the last 73-month, intermittent hemoptysis. After presentation to the ED, CT scan of the neck shows 6.7 cm mass in her esophagus at the thoracic inlet, favor locally advanced esophageal cancer with encasement of major vessels. The mass approaches the skin surface and may be amenable to percutaneous biopsy.  IR has been consulted for possible biopsies.  She had staging to evaluate for distant metastasis, chest CTA /abdominal CT which again showed a large mass at the thoracic inlet, reference neck CT.  Extensive debris in right-sided airways with right upper lobe opacity likely reflecting pneumonia or aspiration.  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  IR and medical oncology has been consulted to assist with the management.  She was started on IV antibiotics empirically for presumptive aspiration pneumonia, Rocephin and IV Flagyl.  She is currently n.p.o. due to concern for aspiration in the setting of large esophageal mass.  Ongoing IV fluid hydration D10 half-normal saline at 100 cc/h.  07/29/2021: Patient was seen at bedside in the ED.  Reports pain in her neck at the site of her mass.  IV narcotics as needed in place.  Patient is n.p.o. due to concern for aspiration.   Assessment/Plan: Principal Problem:   Aspiration pneumonia (HCC) Active Problems:   Chronic pancreatitis ([place])   Hypothyroidism   Alcohol use disorder, severe, dependence (HCC)    Hepatitis C   Nicotine dependence   Bipolar 2 disorder (HCC)   Hypoglycemia   Transaminitis   Dysphagia   Neck mass   Hypokalemia  Aspiration pneumonia in the setting of worsening dysphagia in the presence of a large esophageal mass. N.p.o. for now Swallow evaluation by speech therapist Will likely benefit from gastrojejunal tube placement Continue empiric IV antibiotics, Rocephin and IV Flagyl. Continue IV fluid as stated above Monitor fever curve and WBC Continue aspiration precautions  Newly diagnosed large esophageal mass, 6.7 cm, with concern for malignancy seen on CT scan IR consulted for possible biopsies. Medical oncology consulted to assist with management Pain control in place  Elevated liver chemistries, possibly secondary to hepatitis C Monitor for now and repeat CMP in the morning. Avoid hepatotoxic agents.  Alcohol use disorder Last alcohol intake was 4 days prior to presentation Continue CIWA protocol with IV Ativan since NPO Continue IV thiamine and IV folic acid  Tobacco use disorder Continue nicotine patch  GERD Continue IV Protonix  Intermittent hemoptysis, suspect related to her esophageal mass. Monitor for now Repeat CBC in the morning   Code Status: Subcu Lovenox daily  Family Communication: Significant other at bedside.  Disposition Plan: Likely will discharge to home once IR and medical oncology sign off and when she has a JG tube in place..   Consultants: IR Medical oncology  Procedures: None.  Antimicrobials: Rocephin IV Flagyl  DVT prophylaxis: Subcu Lovenox daily.  Status is: Inpatient  Patient will require at least 2 midnights for further evaluation and treatment of present condition.      Objective: Vitals:   07/29/21 0735  07/29/21 0735 07/29/21 1130 07/29/21 1158  BP: (!) 138/107 (!) 138/107 (!) 155/94 (!) 155/94  Pulse: 80 79 80 80  Resp: 18  17   Temp:      TempSrc:      SpO2: 98%  100%   Weight:       Height:        Intake/Output Summary (Last 24 hours) at 07/29/2021 1211 Last data filed at 07/29/2021 0219 Gross per 24 hour  Intake 1485.6 ml  Output --  Net 1485.6 ml   Filed Weights   07/28/21 0135  Weight: 40.8 kg    Exam:  General: 55 y.o. year-old female well developed well nourished in no acute distress.  Alert and oriented x3. EENT: Mass noted anteriorly to the neck. Cardiovascular: Regular rate and rhythm with no rubs or gallops.  No thyromegaly or JVD noted.   Respiratory: Clear to auscultation with no wheezes or rales. Good inspiratory effort. Abdomen: Soft nontender nondistended with normal bowel sounds x4 quadrants. Musculoskeletal: No lower extremity edema. 2/4 pulses in all 4 extremities. Skin: No ulcerative lesions noted or rashes, Psychiatry: Mood is appropriate for condition and setting   Data Reviewed: CBC: Recent Labs  Lab 07/27/21 0144 07/29/21 0629  WBC 7.7 5.8  NEUTROABS 6.7  --   HGB 11.9* 9.5*  HCT 36.3 29.5*  MCV 104.3* 105.7*  PLT 407* 371   Basic Metabolic Panel: Recent Labs  Lab 07/27/21 0144 07/28/21 0144 07/29/21 0629  NA 136  --  135  K 2.9*  --  3.6  CL 102  --  107  CO2 24  --  22  GLUCOSE 67*  --  151*  BUN 18  --  13  CREATININE 1.00  --  0.81  CALCIUM 9.3  --  8.4*  MG  --  2.6*  --    GFR: Estimated Creatinine Clearance: 50 mL/min (by C-G formula based on SCr of 0.81 mg/dL). Liver Function Tests: Recent Labs  Lab 07/27/21 0144  AST 151*  ALT 41  ALKPHOS 53  BILITOT 1.4*  PROT 10.0*  ALBUMIN 4.8   Recent Labs  Lab 07/27/21 0144  LIPASE 26   No results for input(s): AMMONIA in the last 168 hours. Coagulation Profile: Recent Labs  Lab 07/29/21 0613  INR 1.1   Cardiac Enzymes: No results for input(s): CKTOTAL, CKMB, CKMBINDEX, TROPONINI in the last 168 hours. BNP (last 3 results) No results for input(s): PROBNP in the last 8760 hours. HbA1C: No results for input(s): HGBA1C in the last 72  hours. CBG: Recent Labs  Lab 07/28/21 0505 07/28/21 1512 07/29/21 0608 07/29/21 1109  GLUCAP 63* 169* 168* 132*   Lipid Profile: No results for input(s): CHOL, HDL, LDLCALC, TRIG, CHOLHDL, LDLDIRECT in the last 72 hours. Thyroid Function Tests: No results for input(s): TSH, T4TOTAL, FREET4, T3FREE, THYROIDAB in the last 72 hours. Anemia Panel: No results for input(s): VITAMINB12, FOLATE, FERRITIN, TIBC, IRON, RETICCTPCT in the last 72 hours. Urine analysis:    Component Value Date/Time   COLORURINE STRAW (A) 12/07/2015 0020   APPEARANCEUR CLEAR (A) 12/07/2015 0020   APPEARANCEUR Clear 12/27/2013 0847   LABSPEC 1.005 12/07/2015 0020   LABSPEC 1.006 12/27/2013 0847   PHURINE 7.0 12/07/2015 0020   GLUCOSEU NEGATIVE 12/07/2015 0020   GLUCOSEU Negative 12/27/2013 0847   HGBUR NEGATIVE 12/07/2015 0020   BILIRUBINUR NEGATIVE 12/07/2015 0020   BILIRUBINUR Negative 12/27/2013 0847   KETONESUR NEGATIVE 12/07/2015 0020   PROTEINUR NEGATIVE 12/07/2015 0020  NITRITE NEGATIVE 12/07/2015 0020   LEUKOCYTESUR NEGATIVE 12/07/2015 0020   LEUKOCYTESUR Negative 12/27/2013 0847   Sepsis Labs: @LABRCNTIP (procalcitonin:4,lacticidven:4)  ) Recent Results (from the past 240 hour(s))  Resp Panel by RT-PCR (Flu A&B, Covid) Nasopharyngeal Swab     Status: None   Collection Time: 07/28/21 10:07 AM   Specimen: Nasopharyngeal Swab; Nasopharyngeal(NP) swabs in vial transport medium  Result Value Ref Range Status   SARS Coronavirus 2 by RT PCR NEGATIVE NEGATIVE Final    Comment: (NOTE) SARS-CoV-2 target nucleic acids are NOT DETECTED.  The SARS-CoV-2 RNA is generally detectable in upper respiratory specimens during the acute phase of infection. The lowest concentration of SARS-CoV-2 viral copies this assay can detect is 138 copies/mL. A negative result does not preclude SARS-Cov-2 infection and should not be used as the sole basis for treatment or other patient management decisions. A negative  result may occur with  improper specimen collection/handling, submission of specimen other than nasopharyngeal swab, presence of viral mutation(s) within the areas targeted by this assay, and inadequate number of viral copies(<138 copies/mL). A negative result must be combined with clinical observations, patient history, and epidemiological information. The expected result is Negative.  Fact Sheet for Patients:  EntrepreneurPulse.com.au  Fact Sheet for Healthcare Providers:  IncredibleEmployment.be  This test is no t yet approved or cleared by the [place] FDA and  has been authorized for detection and/or diagnosis of SARS-CoV-2 by FDA under an Emergency Use Authorization (EUA). This EUA will remain  in effect (meaning this test can be used) for the duration of the COVID-19 declaration under Section 564(b)(1) of the Act, 21 U.S.C.section 360bbb-3(b)(1), unless the authorization is terminated  or revoked sooner.       Influenza A by PCR NEGATIVE NEGATIVE Final   Influenza B by PCR NEGATIVE NEGATIVE Final    Comment: (NOTE) The Xpert Xpress SARS-CoV-2/FLU/RSV plus assay is intended as an aid in the diagnosis of influenza from Nasopharyngeal swab specimens and should not be used as a sole basis for treatment. Nasal washings and aspirates are unacceptable for Xpert Xpress SARS-CoV-2/FLU/RSV testing.  Fact Sheet for Patients: EntrepreneurPulse.com.au  Fact Sheet for Healthcare Providers: IncredibleEmployment.be  This test is not yet approved or cleared by the [place] FDA and has been authorized for detection and/or diagnosis of SARS-CoV-2 by FDA under an Emergency Use Authorization (EUA). This EUA will remain in effect (meaning this test can be used) for the duration of the COVID-19 declaration under Section 564(b)(1) of the Act, 21 U.S.C. section 360bbb-3(b)(1), unless the authorization is  terminated or revoked.  Performed at [facility], [address], [place], [place] [address]       Studies: No results found.  Scheduled Meds:  [id number]       midazolam       LORazepam  0-4 mg Intravenous Q6H   Followed by   Derrill Memo ON 07/30/2021] LORazepam  0-4 mg Intravenous Q12H   nicotine  14 mg Transdermal Daily   pantoprazole (PROTONIX) IV  40 mg Intravenous Q24H   thiamine injection  100 mg Intravenous Daily    Continuous Infusions:  cefTRIAXone (ROCEPHIN)  IV Stopped (07/29/21 1044)   D-10-0.45% Sodium Chloride with KCL 1000 ml 100 mL/hr at 68/12/75 1700   folic acid (FOLVITE) IVPB 1 mg (07/29/21 1136)   metronidazole Stopped (07/29/21 0210)     LOS: 1 day     Kayleen Memos, MD [facility] Pager [phone]  If 7PM-7AM, please contact night-coverage [email or web address] Password  [email or web address] 07/29/2021, 12:11 PM

## 2021-07-29 NOTE — ED Notes (Addendum)
Tracy Crane, DO at bedside updating patient and husband on plan of care. Patient refusing to wear monitoring at this time.

## 2021-07-29 NOTE — ED Notes (Signed)
Pt given a warm blanket 

## 2021-07-29 NOTE — ED Notes (Signed)
Patient's husband at bedside. Patient resting comfortably at this time.

## 2021-07-29 NOTE — Progress Notes (Signed)
Patient post Right US neck biopsy per DR Annamaria Boots, tolerated well with only local anesthetic secondarily to airway. SO at bedside during biopsy due to patient cursive,not willing to do biopsy unless he was holding her hand. Vitals stable pre and post procedure. Taken back to Er 18 to await for bed placement with report given to patient's care nurse.

## 2021-07-29 NOTE — ED Notes (Addendum)
Patient found standing in doorway hysterically crying saying "I have cancer" repetitively. Patient assisted back into bed. Patient calmed and husband called per patient's requested. Call light within reach and patient educated. Patient asking for something for her "nerves" at this time.

## 2021-07-29 NOTE — ED Notes (Signed)
Patient transported to Ultrasound 

## 2021-07-29 NOTE — Procedures (Signed)
Interventional Radiology Procedure Note  Procedure: [place] RT NECK CORE BX    Complications: None  Estimated Blood Loss:  MIN  Findings: 18 G CORE X 3    M. Daryll Brod, MD

## 2021-07-29 NOTE — Consult Note (Signed)
Hematology/Oncology Consult note [phone] [phone]  Patient Care Team: Venita Lick, NP as PCP - General (Nurse Practitioner) Vanita Ingles, RN as Registered Nurse (General Practice)   Name of the patient: [name]  [id number]  1966/07/31   Date of visit: 07/29/21 REASON FOR COSULTATION:  Neck mass History of presenting illness-  55 y.o. female with PMH listed at below who presents to ER for evaluation of progressively worsening difficulty swallowing, initially solids now also with liquids. Endorses significant weight loss of 30 pounds in the past couple of months. + Cough.+ Epigastric discomfort. + Chronic alcohol very drowsy, with history of pancreatitis, hepatitis C.  07/28/2021, CT soft tissue showed nearly 7 cm mass at the thoracic inlet, favor locally advanced esophageal cancer with encasement of major vessels.  Single mildly enlarged left level 4 lymph node. 07/28/2021, CT angio chest PE with and without contrast showed extensive debris's in the right sided airway with right upper lobe opacity likely reflecting pneumonia/aspiration.  No acute findings in abdomen.  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  There is also an addendum of ascending aortic aneurysm measuring up to 4.1 cm in diameter. CBC showed anemia with hemoglobin of 9.5, MCV 105.7.  #Patient was admitted for aspiration pneumonia and +/- work-up.  Patient has had ultrasound-guided biopsy today.Patient was sleeping when I walked in. After she woke up, she appears very drowsy, not able to provide any history.  She was able to answer some simple questions.  We discussed about the neck mass biopsy is still pending, there is concern of cancer.  Patient becomes extremely anxious.  No family members at the bedside.  # reviewed records in care everywhere.  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   Review of Systems  Unable to perform ROS: Acuity of condition  Constitutional:  Positive for appetite change and unexpected weight change.  Gastrointestinal:  Negative for abdominal distention and abdominal pain.       Dysphagia  Psychiatric/Behavioral:  The patient is nervous/anxious.    Allergies  Allergen Reactions   Penicillins Rash and Other (See Comments)    Has patient had a PCN reaction causing immediate rash, facial/tongue/throat swelling, SOB or lightheadedness with hypotension: No Has patient had a PCN reaction causing severe rash involving mucus membranes or skin necrosis: No Has patient had a PCN reaction that required hospitalization No Has patient had a PCN reaction occurring within the last 10 years: No If all of the above answers are "NO", then may proceed with Cephalosporin use.    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   Chronic pancreatitis ([place]) 03/18/2015   Hypothyroidism 03/18/2015   Hypertension 03/18/2013   Nicotine dependence 01/14/2011   Mixed, or nondependent drug abuse, in remission ([place]) 12/26/2010     Past Medical History:  Diagnosis Date   Alcohol abuse    Asthma  Cancer [facility])    "carcinoma of throat" with s/p thyroidectomy   Chronic pain syndrome    Chronic pancreatitis ([facility])    COPD (chronic obstructive pulmonary disease) ([place])     Depression    Hepatitis C    History of alcohol abuse    History of drug use    Hypertension 03/18/2013   Hypothyroidism    Low back pain    Pancreatic pseudocyst 03/18/2015   Seizures ([place])    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     Past Surgical History:  Procedure Laterality Date   CESAREAN SECTION     GASTROSTOMY TUBE PLACEMENT  2011   TOTAL THYROIDECTOMY      Social History   Socioeconomic History   Marital status: Single    Spouse name: Not on file   Number of children: Not on file   Years of education: Not on file   Highest education level: Not on file  Occupational History   Not on file  Tobacco Use   Smoking status: Every Day    Packs/day: 0.25    Types: Cigarettes   Smokeless tobacco: Never  Vaping Use   Vaping Use: Never used  Substance and Sexual Activity   Alcohol use: Yes    Alcohol/week: 0.0 standard drinks    Comment: pt states she has a drink every day   Drug use: Yes    Types: Marijuana, Cocaine    Comment: History of drug use   Sexual activity: Not on file  Other Topics Concern   Not on file  Social History Narrative   Not on file   Social Determinants of Health   Financial Resource Strain: High Risk   Difficulty of Paying Living Expenses: Very hard  Food Insecurity: No Food Insecurity   Worried About Charity fundraiser in the Last Year: Never true   Ran Out of Food in the Last Year: Never true  Transportation Needs: No Transportation Needs   Lack of Transportation (Medical): No   Lack of Transportation (Non-Medical): No  Physical Activity: Inactive   Days of Exercise per Week: 0 days   Minutes of Exercise per Session: 0 min  Stress: Stress Concern Present   Feeling of Stress : Very much  Social Connections: Socially Isolated   Frequency of Communication with Friends and Family: Never   Frequency of Social Gatherings with Friends and Family: Never   Attends Religious Services: Never   Marine scientist or Organizations: No    Attends Music therapist: Never   Marital Status: Married  Human resources officer Violence: Not At Risk   Fear of Current or Ex-Partner: No   Emotionally Abused: No   Physically Abused: No   Sexually Abused: No     Family History  Problem Relation Age of Onset   Diabetes Mellitus II Maternal Grandfather    CAD Maternal Grandfather    Diabetes Mellitus II Mother    Liver cancer Mother    Arthritis Mother    Cancer Mother        lung   Cancer Father    Thyroid disease Son    Diabetes Mellitus II Maternal Aunt    Pancreatic cancer Paternal Aunt      Current Facility-Administered Medications:    albuterol (PROVENTIL) (2.5 MG/3ML) 0.083% nebulizer solution 2.5 mg, 2.5 mg, Inhalation, Q4H PRN, Agbata, Tochukwu, MD   cefTRIAXone (ROCEPHIN) 1 g in sodium chloride 0.9 % 100 mL IVPB, 1 g, Intravenous, Q24H, Agbata, Tochukwu, MD,  Last Rate: 200 mL/hr at 07/29/21 0743, 1 g at 07/29/21 0743   dextrose 10 % and 0.45 % NaCl 1,000 mL with potassium chloride 40 mEq infusion, , Intravenous, Continuous, Agbata, Tochukwu, MD, Last Rate: 100 mL/hr at 07/29/21 0345, New Bag at 07/29/21 0345   fentaNYL (SUBLIMAZE) injection 12.5 mcg, 12.5 mcg, Intravenous, Q2H PRN, Sharion Settler, NP, 12.5 mcg at 66/29/47 6546   folic acid 1 mg in sodium chloride 0.9 % 50 mL IVPB, 1 mg, Intravenous, Daily, Agbata, Tochukwu, MD   LORazepam (ATIVAN) injection 0-4 mg, 0-4 mg, Intravenous, Q6H, 1 mg at 07/28/21 0959 **FOLLOWED BY** [START ON 07/30/2021] LORazepam (ATIVAN) injection 0-4 mg, 0-4 mg, Intravenous, Q12H, Agbata, Tochukwu, MD   LORazepam (ATIVAN) tablet 1-4 mg, 1-4 mg, Oral, Q1H PRN **OR** LORazepam (ATIVAN) injection 1-4 mg, 1-4 mg, Intravenous, Q1H PRN, Agbata, Tochukwu, MD, 2 mg at 07/29/21 0741   metroNIDAZOLE (FLAGYL) IVPB 500 mg, 500 mg, Intravenous, Q12H, Agbata, Tochukwu, MD, Stopped at 07/29/21 0210   nicotine (NICODERM CQ - dosed in mg/24 hours) patch 14 mg, 14 mg, Transdermal, Daily, Agbata,  Tochukwu, MD, 14 mg at 07/28/21 1003   ondansetron (ZOFRAN) tablet 4 mg, 4 mg, Oral, Q6H PRN **OR** ondansetron (ZOFRAN) injection 4 mg, 4 mg, Intravenous, Q6H PRN, Agbata, Tochukwu, MD, 4 mg at 07/29/21 0123   pantoprazole (PROTONIX) injection 40 mg, 40 mg, Intravenous, Q24H, Agbata, Tochukwu, MD, 40 mg at 07/28/21 0959   thiamine (B-1) injection 100 mg, 100 mg, Intravenous, Daily, Agbata, Tochukwu, MD  Current Outpatient Medications:    albuterol (VENTOLIN HFA) 108 (90 Base) MCG/ACT inhaler, Inhale 1-2 puffs into the lungs every 4 (four) hours as needed for wheezing or shortness of breath. (Patient not taking: No sig reported), Disp: 8.5 g, Rfl: 2   levothyroxine (SYNTHROID) 125 MCG tablet, Take 1 tablet (125 mcg total) by mouth daily. (Patient not taking: No sig reported), Disp: 30 tablet, Rfl: 0   QUEtiapine (SEROQUEL XR) 50 MG TB24 24 hr tablet, Take 1 tablet (50 mg total) by mouth at bedtime. (Patient not taking: No sig reported), Disp: 30 tablet, Rfl: 0   Physical exam:  Vitals:   07/29/21 0500 07/29/21 0600 07/29/21 0735 07/29/21 0735  BP: 116/83 112/79 (!) 138/107 (!) 138/107  Pulse: 83 80 80 79  Resp: 16 16 18    Temp:      TempSrc:      SpO2: 98% 98% 98%   Weight:      Height:       Physical Exam Constitutional:      General: She is not in acute distress. HENT:     Head: Normocephalic.  Neck:     Comments: Right neck mass Cardiovascular:     Rate and Rhythm: Normal rate.  Pulmonary:     Effort: Pulmonary effort is normal.  Abdominal:     Palpations: Abdomen is soft.  Musculoskeletal:        General: Normal range of motion.  Skin:    General: Skin is warm.  Neurological:     General: No focal deficit present.  Psychiatric:     Comments: anxious        CMP Latest Ref Rng & Units 07/29/2021  Glucose 70 - 99 mg/dL 151(H)  BUN 6 - 20 mg/dL 13  Creatinine 0.44 - 1.00 mg/dL 0.81  Sodium 135 - 145 mmol/L 135  Potassium 3.5 - 5.1 mmol/L 3.6  Chloride 98 - 111  mmol/L 107  CO2 22 - 32 mmol/L 22  Calcium 8.9 - 10.3 mg/dL 8.4(L)  Total Protein 6.5 - 8.1 g/dL -  Total Bilirubin 0.3 - 1.2 mg/dL -  Alkaline Phos 38 - 126 U/L -  AST 15 - 41 U/L -  ALT 0 - 44 U/L -   CBC Latest Ref Rng & Units 07/29/2021  WBC 4.0 - 10.5 K/uL 5.8  Hemoglobin 12.0 - 15.0 g/dL 9.5(L)  Hematocrit 36.0 - 46.0 % 29.5(L)  Platelets 150 - 400 K/uL 331    RADIOGRAPHIC STUDIES: I have personally reviewed the radiological images as listed and agreed with the findings in the report. CT Soft Tissue Neck W Contrast  Result Date: 07/28/2021 CLINICAL DATA:  Abdominal pain for 4 months, worsening. History of alcohol abuse. Neck abscess, deep tissue. EXAM: CT NECK WITH CONTRAST TECHNIQUE: Multidetector CT imaging of the neck was performed using the standard protocol following the bolus administration of intravenous contrast. CONTRAST:  91mL OMNIPAQUE IOHEXOL 300 MG/ML  SOLN COMPARISON:  None. FINDINGS: Pharynx, larynx, and esophagus: At the level of the thoracic inlet is a large mass measuring up to 6.7 cm, displacing the carotid sheath anteriorly, the trachea anteriorly and leftward, in the esophagus leftward. Margins with the right wall of the esophagus are indistinct and there is a gas channel in the region of the expected esophageal wall. History of abscess but the mass appears more solid and necrotic appearing with limited regional fat edema and encasement of vessels including the right proximal vertebral artery, right common carotid, and proximal right subclavian. Burtis Junes an exophytic esophageal mass. Salivary glands: No inflammation, mass, or stone. Thyroid: Atrophic if not resected. Lymph nodes: 10 mm left level 4 lymph node. Vascular: Vessel encasement as above. No acute vascular finding. Atheromatous calcification. Limited intracranial: Negative Visualized orbits: Negative Mastoids and visualized paranasal sinuses: Clear Skeleton: Cervical spine degeneration Upper chest: Apical  emphysema. IMPRESSION: 1. Nearly 7 cm mass at the thoracic inlet, favor locally advanced esophageal cancer with encasement of major vessels. The mass approaches the skin surface and may be amenable to percutaneous biopsy. 2. Single mildly enlarged left level 4 lymph node. Electronically Signed   By: Jorje Guild M.D.   On: 07/28/2021 05:48   CT Angio Chest PE W and/or Wo Contrast  Addendum Date: 07/28/2021   ADDENDUM REPORT: 07/28/2021 06:35 ADDENDUM: Omitted impression #3 of ascending aortic aneurysm (measuring 4.1 cm in diameter) significance to be determined by workup of the patient's neck mass. Electronically Signed   By: Jorje Guild M.D.   On: 07/28/2021 06:35   Result Date: 07/28/2021 CLINICAL DATA:  Abdominal distension in the left upper quadrant. Abdominal pain. EXAM: CT ANGIOGRAPHY CHEST CT ABDOMEN AND PELVIS WITH CONTRAST TECHNIQUE: Multidetector CT imaging of the chest was performed using the standard protocol during bolus administration of intravenous contrast. Multiplanar CT image reconstructions and MIPs were obtained to evaluate the vascular anatomy. Multidetector CT imaging of the abdomen and pelvis was performed using the standard protocol during bolus administration of intravenous contrast. CONTRAST:  38mL OMNIPAQUE IOHEXOL 300 MG/ML  SOLN COMPARISON:  Abdominal CT 11/28/2015 FINDINGS: CTA CHEST FINDINGS Cardiovascular: Cardiomegaly. Small to moderate low-density pericardial effusion measuring up to 11 mm in thickness. Aneurysmal ascending aorta measuring up to 4.1 cm in diameter. Mediastinum/Nodes: Mass at the thoracic inlet and upper mediastinum as described on dedicated neck CT no lymphadenopathy seen in the mediastinum. Lungs/Pleura: Airway thickening with debris in the right sided airways there is associated with airspace opacity in the inferior and posterior right upper lobe. Mild  atelectasis in the medial right middle lobe and lingula. Musculoskeletal: Scoliosis. Review of the  MIP images confirms the above findings. CT ABDOMEN and PELVIS FINDINGS Hepatobiliary: No focal liver abnormality.Prominence of intrahepatic bile ducts. No calcified cholelithiasis. Pancreas: Unremarkable. Spleen: Unremarkable. Adrenals/Urinary Tract: Negative adrenals. No hydronephrosis or stone. Unremarkable bladder. Stomach/Bowel:  No obstruction. No appendicitis. 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 There is also chronic occlusion of the distal splenic vein. Numerous upper abdominal collaterals. Atheromatous calcification of the aorta and iliacs. No mass or adenopathy. Reproductive:No pathologic findings. Other: No ascites or pneumoperitoneum. Musculoskeletal: No acute abnormalities. Review of the MIP images confirms the above findings. IMPRESSION: Chest CTA: 1. Large mass at the thoracic inlet, reference neck CT. 2. Extensive debris in right-sided airways with right upper lobe opacity likely reflecting pneumonia or aspiration. Abdominal CT: 1. No acute finding. 2. 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 Electronically Signed: By: Jorje Guild M.D. On: 07/28/2021 05:56   CT ABDOMEN PELVIS W CONTRAST  Addendum Date: 07/28/2021   ADDENDUM REPORT: 07/28/2021 06:35 ADDENDUM: Omitted impression #3 of ascending aortic aneurysm (measuring 4.1 cm in diameter) significance to be determined by workup of the patient's neck mass. Electronically Signed   By: Jorje Guild M.D.   On: 07/28/2021 06:35   Result Date: 07/28/2021 CLINICAL DATA:  Abdominal distension in the left upper quadrant. Abdominal pain. EXAM: CT ANGIOGRAPHY CHEST CT ABDOMEN AND PELVIS WITH CONTRAST TECHNIQUE: Multidetector CT imaging of the chest was performed using the standard protocol during bolus administration of intravenous contrast. Multiplanar CT image reconstructions and MIPs were obtained to evaluate the vascular anatomy. Multidetector CT imaging of  the abdomen and pelvis was performed using the standard protocol during bolus administration of intravenous contrast. CONTRAST:  2mL OMNIPAQUE IOHEXOL 300 MG/ML  SOLN COMPARISON:  Abdominal CT 11/28/2015 FINDINGS: CTA CHEST FINDINGS Cardiovascular: Cardiomegaly. Small to moderate low-density pericardial effusion measuring up to 11 mm in thickness. Aneurysmal ascending aorta measuring up to 4.1 cm in diameter. Mediastinum/Nodes: Mass at the thoracic inlet and upper mediastinum as described on dedicated neck CT no lymphadenopathy seen in the mediastinum. Lungs/Pleura: Airway thickening with debris in the right sided airways there is associated with airspace opacity in the inferior and posterior right upper lobe. Mild atelectasis in the medial right middle lobe and lingula. Musculoskeletal: Scoliosis. Review of the MIP images confirms the above findings. CT ABDOMEN and PELVIS FINDINGS Hepatobiliary: No focal liver abnormality.Prominence of intrahepatic bile ducts. No calcified cholelithiasis. Pancreas: Unremarkable. Spleen: Unremarkable. Adrenals/Urinary Tract: Negative adrenals. No hydronephrosis or stone. Unremarkable bladder. Stomach/Bowel:  No obstruction. No appendicitis. 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 There is also chronic occlusion of the distal splenic vein. Numerous upper abdominal collaterals. Atheromatous calcification of the aorta and iliacs. No mass or adenopathy. Reproductive:No pathologic findings. Other: No ascites or pneumoperitoneum. Musculoskeletal: No acute abnormalities. Review of the MIP images confirms the above findings. IMPRESSION: Chest CTA: 1. Large mass at the thoracic inlet, reference neck CT. 2. Extensive debris in right-sided airways with right upper lobe opacity likely reflecting pneumonia or aspiration. Abdominal CT: 1. No acute finding. 2. 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 Electronically  Signed: By: Jorje Guild M.D. On: 07/28/2021 05:56    Assessment and plan- Patient is a 55 y.o. female with history of chronic alcohol use, pancreatitis, bipolar disorder, hepatitis C, nicotine dependence COPD presented for evaluation of dysphagia.  CT findings of large neck mass  encasing vessels.  #Neck mass, IR biopsy to establish tissue diagnosis. I discussed briefly with patient about possible diagnosis of cancer, pending pathology reports which will be available in a few days.  #Aspiration pneumonia, IV antibiotics  N.p.o., speech swallow evaluation  # history of thyroidectomy due to history of thyroid cancer, check Tsh, free T4 #Macrocytic anemia, likely due to chronic alcohol use.  Check vitamin B12, folate, smear,retic panel.  Thank you for allowing me to participate in the care of this patient.   Earlie Server, MD, PhD Hematology Oncology 07/29/2021

## 2021-07-29 NOTE — ED Notes (Signed)
Pt assisted to bedside commode. Pt repositioned in bed, warm blankets given. Pt asleep at this time.

## 2021-07-29 NOTE — ED Notes (Signed)
Pt refusing to keep cardiac leads on at this time. Pt will allow this RN to obtain VS periodically.

## 2021-07-29 NOTE — ED Notes (Addendum)
Patient speaking on phone with aunt. Patient had this RN speak with aunt and give update. Patient falling asleep while RN on phone speaking with aunt.

## 2021-07-30 ENCOUNTER — Other Ambulatory Visit: Payer: Self-pay | Admitting: Oncology

## 2021-07-30 LAB — COMPREHENSIVE METABOLIC PANEL
ALT: 26 U/L (ref 0–44)
AST: 71 U/L — ABNORMAL HIGH (ref 15–41)
Albumin: 3.5 g/dL (ref 3.5–5.0)
Alkaline Phosphatase: 42 U/L (ref 38–126)
Anion gap: 3 — ABNORMAL LOW (ref 5–15)
BUN: 7 mg/dL (ref 6–20)
CO2: 23 mmol/L (ref 22–32)
Calcium: 8.2 mg/dL — ABNORMAL LOW (ref 8.9–10.3)
Chloride: 110 mmol/L (ref 98–111)
Creatinine, Ser: 0.66 mg/dL (ref 0.44–1.00)
GFR, Estimated: >60 mL/min (ref >60)
Glucose, Bld: 139 mg/dL — ABNORMAL HIGH (ref 70–99)
Potassium: 3.9 mmol/L (ref 3.5–5.1)
Sodium: 136 mmol/L (ref 135–145)
Total Bilirubin: 0.9 mg/dL (ref 0.3–1.2)
Total Protein: 7.6 g/dL (ref 6.5–8.1)

## 2021-07-30 LAB — RETIC PANEL
Immature Retic Fract: 8.7 % (ref 2.3–15.9)
RBC.: 2.78 MIL/uL — ABNORMAL LOW (ref 3.87–5.11)
Retic Count, Absolute: 40.6 10*3/uL (ref 19.0–186.0)
Retic Ct Pct: 1.5 % (ref 0.4–3.1)
Reticulocyte Hemoglobin: 32.5 pg (ref >27.9)

## 2021-07-30 LAB — VITAMIN B12: Vitamin B-12: 891 pg/mL (ref 180–914)

## 2021-07-30 LAB — CBC
HCT: 29 % — ABNORMAL LOW (ref 36.0–46.0)
Hemoglobin: 9.4 g/dL — ABNORMAL LOW (ref 12.0–15.0)
MCH: 33.7 pg (ref 26.0–34.0)
MCHC: 32.4 g/dL (ref 30.0–36.0)
MCV: 103.9 fL — ABNORMAL HIGH (ref 80.0–100.0)
Platelets: 292 10*3/uL (ref 150–400)
RBC: 2.79 MIL/uL — ABNORMAL LOW (ref 3.87–5.11)
RDW: 12.7 % (ref 11.5–15.5)
WBC: 4.5 10*3/uL (ref 4.0–10.5)
nRBC: 0 % (ref 0.0–0.2)

## 2021-07-30 LAB — SURGICAL PATHOLOGY

## 2021-07-30 LAB — GLUCOSE, CAPILLARY
Glucose-Capillary: 141 mg/dL — ABNORMAL HIGH (ref 70–99)
Glucose-Capillary: 143 mg/dL — ABNORMAL HIGH (ref 70–99)
Glucose-Capillary: 77 mg/dL (ref 70–99)

## 2021-07-30 LAB — TSH: TSH: 287.95 u[IU]/mL — ABNORMAL HIGH (ref 0.350–4.500)

## 2021-07-30 LAB — T4, FREE: Free T4: <0.25 ng/dL — ABNORMAL LOW (ref 0.61–1.12)

## 2021-07-30 LAB — FOLATE: Folate: 13.9 ng/mL (ref >5.9)

## 2021-07-30 NOTE — Discharge Summary (Addendum)
Discharge Summary  [name] HFW:[id number] DOB: 07-May-1966  PCP: Venita Lick, NP  Admit date: 07/28/2021 Discharge date: 07/30/2021  Time spent: 35 minutes.  Recommendations for Outpatient Follow-up:  Patient left AMA.  Discharge Diagnoses:  Active Hospital Problems   Diagnosis Date Noted   Aspiration pneumonia ([place]) 07/28/2021   Hypoglycemia 07/28/2021   Transaminitis 07/28/2021   Dysphagia 07/28/2021   Neck mass 07/28/2021   Hypokalemia 07/28/2021   Bipolar 2 disorder ([place]) 07/27/2018   Hepatitis C 07/20/2015   Alcohol use disorder, severe, dependence ([place]) 03/22/2015   Chronic pancreatitis ([place]) 03/18/2015   Hypothyroidism 03/18/2015   Nicotine dependence 01/14/2011    Resolved Hospital Problems  No resolved problems to display.    Vitals:   07/30/21 0429 07/30/21 0809  BP: (!) 147/73 (!) 154/93  Pulse: 79 82  Resp:  18  Temp: 97.8 F (36.6 C) 98.4 F (36.9 C)  SpO2: 99% 99%    History of present illness:  [name] is a 55 y.o. female with medical history significant for chronic pancreatitis, thyroidectomy over 30 years ago, alcohol abuse, bipolar disorder, hepatitis C, nicotine dependence, COPD who presents to [facility] ED due to difficulty swallowing and inability to tolerate oral intake, gradually worsening for the past 2 months.  Associated with 30 pound weight loss for the last 67-month, intermittent hemoptysis. After presentation to the ED, CT scan of the neck shows 6.7 cm mass in her esophagus at the thoracic inlet, favor locally advanced esophageal cancer with encasement of major vessels. The mass approaches the skin surface and may be amenable to percutaneous biopsy.  IR has been consulted for possible biopsies.   She had staging to evaluate for distant metastasis, chest CTA /abdominal CT which again showed a large mass at the thoracic inlet, reference neck CT.  Extensive debris in right-sided airways with right upper lobe opacity likely reflecting  pneumonia or aspiration.  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   IR and medical oncology has been consulted to assist with the management.  She was started on IV antibiotics empirically for presumptive aspiration pneumonia, Rocephin and IV Flagyl.  She is currently n.p.o. due to concern for aspiration in the setting of large esophageal mass.  Ongoing IV fluid hydration D10 half-normal saline at 100 cc/h.   Post esophageal mass biopsy by IR on 07/29/2021.  Biopsy showed squamous cell carcinoma.  07/30/2021: Patient was seen at her bedside.  She left AGAINST MEDICAL ADVICE after being seen.  Patient was advised to stay to continue treatment however she declined.     Hospital Course:  Principal Problem:   Aspiration pneumonia ([place]) Active Problems:   Chronic pancreatitis ([place])   Hypothyroidism   Alcohol use disorder, severe, dependence ([place])   Hepatitis C   Nicotine dependence   Bipolar 2 disorder ([facility])   Hypoglycemia   Transaminitis   Dysphagia   Neck mass   Hypokalemia  Aspiration pneumonia in the setting of worsening dysphagia in the presence of a large esophageal mass. N.p.o. for now Swallow evaluation by speech therapist Will likely benefit from gastrojejunal tube placement Continue empiric IV antibiotics, Rocephin and IV Flagyl. Continue IV fluid as stated above Monitor fever curve and WBC Continue aspiration precautions   Newly diagnosed large esophageal mass, 6.7 cm, with concern for malignancy seen on CT scan, post biopsy by IR on 07/29/2021, biopsy revealed squamous cell carcinoma on 07/30/2021. Medical oncology consulted to assist with management Pain control in place  Patient left AGAINST MEDICAL ADVICE on 07/30/2021.  Elevated liver chemistries, possibly secondary to hepatitis C Monitor for now and repeat CMP in the morning. Avoid hepatotoxic agents.   Alcohol use disorder Last alcohol intake was 4 days prior to  presentation Continue CIWA protocol with IV Ativan since NPO Continue IV thiamine and IV folic acid   Tobacco use disorder Continue nicotine patch   GERD Continue IV Protonix   Intermittent hemoptysis, suspect related to her esophageal mass. Monitor for now Repeat CBC in the morning  CODE STATUS: Full code.     Consultants: IR Medical oncology   Procedures: None.   Antimicrobials: Rocephin IV Flagyl     Discharge Exam: BP (!) 154/93 (BP Location: Left Arm)   Pulse 82   Temp 98.4 F (36.9 C)   Resp 18   Ht 5\' 1"  (1.549 m)   Wt 40.8 kg   LMP  (LMP Unknown)   SpO2 99%   BMI 17.01 kg/m  General: 55 y.o. year-old female frail-appearing in no acute distress.  Alert and oriented x3. Cardiovascular: Regular rate and rhythm with no rubs or gallops.  Neck mass. Respiratory: Diffuse rales bilaterally with no wheezing noted.   Abdomen: Soft nontender nondistended with normal bowel sounds x4 quadrants. Musculoskeletal: No lower extremity edema. 2/4 pulses in all 4 extremities. Skin: No ulcerative lesions noted or rashes, Psychiatry: Mood is anxious.  Discharge Instructions You were cared for by a hospitalist during your hospital stay. If you have any questions about your discharge medications or the care you received while you were in the hospital after you are discharged, you can call the unit and asked to speak with the hospitalist on call if the hospitalist that took care of you is not available. Once you are discharged, your primary care physician will handle any further medical issues. Please note that NO REFILLS for any discharge medications will be authorized once you are discharged, as it is imperative that you return to your primary care physician (or establish a relationship with a primary care physician if you do not have one) for your aftercare needs so that they can reassess your need for medications and monitor your lab values.   Allergies as of 07/30/2021        Reactions   Penicillins Rash, Other (See Comments)   Has patient had a PCN reaction causing immediate rash, facial/tongue/throat swelling, SOB or lightheadedness with hypotension: No Has patient had a PCN reaction causing severe rash involving mucus membranes or skin necrosis: No Has patient had a PCN reaction that required hospitalization No Has patient had a PCN reaction occurring within the last 10 years: No If all of the above answers are "NO", then may proceed with Cephalosporin use.        Medication List     ASK your doctor about these medications    albuterol 108 (90 Base) MCG/ACT inhaler Commonly known as: VENTOLIN HFA Inhale 1-2 puffs into the lungs every 4 (four) hours as needed for wheezing or shortness of breath.   levothyroxine 125 MCG tablet Commonly known as: SYNTHROID Take 1 tablet (125 mcg total) by mouth daily.   QUEtiapine 50 MG Tb24 24 hr tablet Commonly known as: SEROquel XR Take 1 tablet (50 mg total) by mouth at bedtime.       Allergies  Allergen Reactions   Penicillins Rash and Other (See Comments)    Has patient had a PCN reaction causing immediate rash, facial/tongue/throat swelling, SOB or lightheadedness with  hypotension: No Has patient had a PCN reaction causing severe rash involving mucus membranes or skin necrosis: No Has patient had a PCN reaction that required hospitalization No Has patient had a PCN reaction occurring within the last 10 years: No If all of the above answers are "NO", then may proceed with Cephalosporin use.      The results of significant diagnostics from this hospitalization (including imaging, microbiology, ancillary and laboratory) are listed below for reference.    Significant Diagnostic Studies: CT Soft Tissue Neck W Contrast  Result Date: 07/28/2021 CLINICAL DATA:  Abdominal pain for 4 months, worsening. History of alcohol abuse. Neck abscess, deep tissue. EXAM: CT NECK WITH CONTRAST TECHNIQUE: Multidetector  CT imaging of the neck was performed using the standard protocol following the bolus administration of intravenous contrast. CONTRAST:  8mL OMNIPAQUE IOHEXOL 300 MG/ML  SOLN COMPARISON:  None. FINDINGS: Pharynx, larynx, and esophagus: At the level of the thoracic inlet is a large mass measuring up to 6.7 cm, displacing the carotid sheath anteriorly, the trachea anteriorly and leftward, in the esophagus leftward. Margins with the right wall of the esophagus are indistinct and there is a gas channel in the region of the expected esophageal wall. History of abscess but the mass appears more solid and necrotic appearing with limited regional fat edema and encasement of vessels including the right proximal vertebral artery, right common carotid, and proximal right subclavian. Burtis Junes an exophytic esophageal mass. Salivary glands: No inflammation, mass, or stone. Thyroid: Atrophic if not resected. Lymph nodes: 10 mm left level 4 lymph node. Vascular: Vessel encasement as above. No acute vascular finding. Atheromatous calcification. Limited intracranial: Negative Visualized orbits: Negative Mastoids and visualized paranasal sinuses: Clear Skeleton: Cervical spine degeneration Upper chest: Apical emphysema. IMPRESSION: 1. Nearly 7 cm mass at the thoracic inlet, favor locally advanced esophageal cancer with encasement of major vessels. The mass approaches the skin surface and may be amenable to percutaneous biopsy. 2. Single mildly enlarged left level 4 lymph node. Electronically Signed   By: Jorje Guild M.D.   On: 07/28/2021 05:48   CT Angio Chest PE W and/or Wo Contrast  Addendum Date: 07/28/2021   ADDENDUM REPORT: 07/28/2021 06:35 ADDENDUM: Omitted impression #3 of ascending aortic aneurysm (measuring 4.1 cm in diameter) significance to be determined by workup of the patient's neck mass. Electronically Signed   By: Jorje Guild M.D.   On: 07/28/2021 06:35   Result Date: 07/28/2021 CLINICAL DATA:  Abdominal  distension in the left upper quadrant. Abdominal pain. EXAM: CT ANGIOGRAPHY CHEST CT ABDOMEN AND PELVIS WITH CONTRAST TECHNIQUE: Multidetector CT imaging of the chest was performed using the standard protocol during bolus administration of intravenous contrast. Multiplanar CT image reconstructions and MIPs were obtained to evaluate the vascular anatomy. Multidetector CT imaging of the abdomen and pelvis was performed using the standard protocol during bolus administration of intravenous contrast. CONTRAST:  60mL OMNIPAQUE IOHEXOL 300 MG/ML  SOLN COMPARISON:  Abdominal CT 11/28/2015 FINDINGS: CTA CHEST FINDINGS Cardiovascular: Cardiomegaly. Small to moderate low-density pericardial effusion measuring up to 11 mm in thickness. Aneurysmal ascending aorta measuring up to 4.1 cm in diameter. Mediastinum/Nodes: Mass at the thoracic inlet and upper mediastinum as described on dedicated neck CT no lymphadenopathy seen in the mediastinum. Lungs/Pleura: Airway thickening with debris in the right sided airways there is associated with airspace opacity in the inferior and posterior right upper lobe. Mild atelectasis in the medial right middle lobe and lingula. Musculoskeletal: Scoliosis. Review of the MIP images confirms  the above findings. CT ABDOMEN and PELVIS FINDINGS Hepatobiliary: No focal liver abnormality.Prominence of intrahepatic bile ducts. No calcified cholelithiasis. Pancreas: Unremarkable. Spleen: Unremarkable. Adrenals/Urinary Tract: Negative adrenals. No hydronephrosis or stone. Unremarkable bladder. Stomach/Bowel:  No obstruction. No appendicitis. 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 There is also chronic occlusion of the distal splenic vein. Numerous upper abdominal collaterals. Atheromatous calcification of the aorta and iliacs. No mass or adenopathy. Reproductive:No pathologic findings. Other: No ascites or pneumoperitoneum. Musculoskeletal: No acute abnormalities.  Review of the MIP images confirms the above findings. IMPRESSION: Chest CTA: 1. Large mass at the thoracic inlet, reference neck CT. 2. Extensive debris in right-sided airways with right upper lobe opacity likely reflecting pneumonia or aspiration. Abdominal CT: 1. No acute finding. 2. 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 Electronically Signed: By: Jorje Guild M.D. On: 07/28/2021 05:56   CT ABDOMEN PELVIS W CONTRAST  Addendum Date: 07/28/2021   ADDENDUM REPORT: 07/28/2021 06:35 ADDENDUM: Omitted impression #3 of ascending aortic aneurysm (measuring 4.1 cm in diameter) significance to be determined by workup of the patient's neck mass. Electronically Signed   By: Jorje Guild M.D.   On: 07/28/2021 06:35   Result Date: 07/28/2021 CLINICAL DATA:  Abdominal distension in the left upper quadrant. Abdominal pain. EXAM: CT ANGIOGRAPHY CHEST CT ABDOMEN AND PELVIS WITH CONTRAST TECHNIQUE: Multidetector CT imaging of the chest was performed using the standard protocol during bolus administration of intravenous contrast. Multiplanar CT image reconstructions and MIPs were obtained to evaluate the vascular anatomy. Multidetector CT imaging of the abdomen and pelvis was performed using the standard protocol during bolus administration of intravenous contrast. CONTRAST:  70mL OMNIPAQUE IOHEXOL 300 MG/ML  SOLN COMPARISON:  Abdominal CT 11/28/2015 FINDINGS: CTA CHEST FINDINGS Cardiovascular: Cardiomegaly. Small to moderate low-density pericardial effusion measuring up to 11 mm in thickness. Aneurysmal ascending aorta measuring up to 4.1 cm in diameter. Mediastinum/Nodes: Mass at the thoracic inlet and upper mediastinum as described on dedicated neck CT no lymphadenopathy seen in the mediastinum. Lungs/Pleura: Airway thickening with debris in the right sided airways there is associated with airspace opacity in the inferior and posterior right upper lobe. Mild atelectasis  in the medial right middle lobe and lingula. Musculoskeletal: Scoliosis. Review of the MIP images confirms the above findings. CT ABDOMEN and PELVIS FINDINGS Hepatobiliary: No focal liver abnormality.Prominence of intrahepatic bile ducts. No calcified cholelithiasis. Pancreas: Unremarkable. Spleen: Unremarkable. Adrenals/Urinary Tract: Negative adrenals. No hydronephrosis or stone. Unremarkable bladder. Stomach/Bowel:  No obstruction. No appendicitis. 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 There is also chronic occlusion of the distal splenic vein. Numerous upper abdominal collaterals. Atheromatous calcification of the aorta and iliacs. No mass or adenopathy. Reproductive:No pathologic findings. Other: No ascites or pneumoperitoneum. Musculoskeletal: No acute abnormalities. Review of the MIP images confirms the above findings. IMPRESSION: Chest CTA: 1. Large mass at the thoracic inlet, reference neck CT. 2. Extensive debris in right-sided airways with right upper lobe opacity likely reflecting pneumonia or aspiration. Abdominal CT: 1. No acute finding. 2. 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 Electronically Signed: By: Jorje Guild M.D. On: 07/28/2021 05:56   [place] CORE BIOPSY (SOFT TISSUE)  Result Date: 07/29/2021 INDICATION: Large neck mass at the thoracic inlet favored to be advanced esophageal cancer, dysphagia EXAM: ULTRASOUND CORE BIOPSY LARGE RIGHT NECK MASS MEDICATIONS: 1% LIDOCAINE LOCAL ANESTHESIA/SEDATION: None. Total intra-service moderate Sedation Time: 0 minutes. The patient's level of consciousness and vital signs were  monitored continuously by radiology nursing throughout the procedure under my direct supervision. FLUOROSCOPY TIME:  Fluoroscopy Time: None. COMPLICATIONS: None immediate. PROCEDURE: Informed written consent was obtained from the patient after a thorough discussion of the procedural risks,  benefits and alternatives. All questions were addressed. Maximal Sterile Barrier Technique was utilized including caps, mask, sterile gowns, sterile gloves, sterile drape, hand hygiene and skin antiseptic. A timeout was performed prior to the initiation of the procedure. Previous imaging reviewed. The large predominately right neck mass was localized and marked just medial to the right carotid artery. Under sterile conditions and local anesthesia, an 18 gauge core biopsy needle was advanced into the right neck mass. Needle position confirmed with ultrasound. Images obtained for documentation. 3 18 gauge core biopsies obtained. Samples were intact and non fragmented. These were placed in formalin. Needle removed. Postprocedure imaging demonstrates no hemorrhage or hematoma. Patient tolerated the biopsy well. IMPRESSION: Successful ultrasound right neck mass 18 gauge core biopsies. Electronically Signed   By: Jerilynn Mages.  Shick M.D.   On: 07/29/2021 13:34    Microbiology: Recent Results (from the past 240 hour(s))  Resp Panel by RT-PCR (Flu A&B, Covid) Nasopharyngeal Swab     Status: None   Collection Time: 07/28/21 10:07 AM   Specimen: Nasopharyngeal Swab; Nasopharyngeal(NP) swabs in vial transport medium  Result Value Ref Range Status   SARS Coronavirus 2 by RT PCR NEGATIVE NEGATIVE Final    Comment: (NOTE) SARS-CoV-2 target nucleic acids are NOT DETECTED.  The SARS-CoV-2 RNA is generally detectable in upper respiratory specimens during the acute phase of infection. The lowest concentration of SARS-CoV-2 viral copies this assay can detect is 138 copies/mL. A negative result does not preclude SARS-Cov-2 infection and should not be used as the sole basis for treatment or other patient management decisions. A negative result may occur with  improper specimen collection/handling, submission of specimen other than nasopharyngeal swab, presence of viral mutation(s) within the areas targeted by this assay, and  inadequate number of viral copies(<138 copies/mL). A negative result must be combined with clinical observations, patient history, and epidemiological information. The expected result is Negative.  Fact Sheet for Patients:  EntrepreneurPulse.com.au  Fact Sheet for Healthcare Providers:  IncredibleEmployment.be  This test is no t yet approved or cleared by the [place] FDA and  has been authorized for detection and/or diagnosis of SARS-CoV-2 by FDA under an Emergency Use Authorization (EUA). This EUA will remain  in effect (meaning this test can be used) for the duration of the COVID-19 declaration under Section 564(b)(1) of the Act, 21 U.S.C.section 360bbb-3(b)(1), unless the authorization is terminated  or revoked sooner.       Influenza A by PCR NEGATIVE NEGATIVE Final   Influenza B by PCR NEGATIVE NEGATIVE Final    Comment: (NOTE) The Xpert Xpress SARS-CoV-2/FLU/RSV plus assay is intended as an aid in the diagnosis of influenza from Nasopharyngeal swab specimens and should not be used as a sole basis for treatment. Nasal washings and aspirates are unacceptable for Xpert Xpress SARS-CoV-2/FLU/RSV testing.  Fact Sheet for Patients: EntrepreneurPulse.com.au  Fact Sheet for Healthcare Providers: IncredibleEmployment.be  This test is not yet approved or cleared by the [place] FDA and has been authorized for detection and/or diagnosis of SARS-CoV-2 by FDA under an Emergency Use Authorization (EUA). This EUA will remain in effect (meaning this test can be used) for the duration of the COVID-19 declaration under Section 564(b)(1) of the Act, 21 U.S.C. section 360bbb-3(b)(1), unless the authorization is terminated or  revoked.  Performed at [facility], [address] [place], [place] [address]      Labs: Basic Metabolic Panel: Recent Labs  Lab 07/27/21 0144 07/28/21 0144  07/29/21 0629 07/30/21 0451  NA 136  --  135 136  K 2.9*  --  3.6 3.9  CL 102  --  107 110  CO2 24  --  22 23  GLUCOSE 67*  --  151* 139*  BUN 18  --  13 7  CREATININE 1.00  --  0.81 0.66  CALCIUM 9.3  --  8.4* 8.2*  MG  --  2.6*  --   --    Liver Function Tests: Recent Labs  Lab 07/27/21 0144 07/30/21 0451  AST 151* 71*  ALT 41 26  ALKPHOS 53 42  BILITOT 1.4* 0.9  PROT 10.0* 7.6  ALBUMIN 4.8 3.5   Recent Labs  Lab 07/27/21 0144  LIPASE 26   No results for input(s): AMMONIA in the last 168 hours. CBC: Recent Labs  Lab 07/27/21 0144 07/29/21 0629 07/30/21 0451  WBC 7.7 5.8 4.5  NEUTROABS 6.7  --   --   HGB 11.9* 9.5* 9.4*  HCT 36.3 29.5* 29.0*  MCV 104.3* 105.7* 103.9*  PLT 407* 331 292   Cardiac Enzymes: No results for input(s): CKTOTAL, CKMB, CKMBINDEX, TROPONINI in the last 168 hours. BNP: BNP (last 3 results) No results for input(s): BNP in the last 8760 hours.  ProBNP (last 3 results) No results for input(s): PROBNP in the last 8760 hours.  CBG: Recent Labs  Lab 07/29/21 1355 07/29/21 2110 07/30/21 0023 07/30/21 0428 07/30/21 0758  GLUCAP 71 133* 143* 141* 77       Signed:  Kayleen Memos, MD [facility] 07/30/2021, 5:17 PM

## 2021-07-30 NOTE — Progress Notes (Signed)
IVT consulted for difficult stick.  Per RN, pt left AMA upon arrival.

## 2021-07-30 NOTE — Progress Notes (Signed)
Pt signed [place] paperwork and has left.  Stated she was not staying here anymore.  IV already removed this AM by patient.  She collected all her belongings and walked off the unit.

## 2021-07-31 ENCOUNTER — Emergency Department: Payer: Medicaid Other

## 2021-07-31 ENCOUNTER — Other Ambulatory Visit: Payer: Self-pay

## 2021-07-31 ENCOUNTER — Inpatient Hospital Stay
Admission: EM | Admit: 2021-07-31 | Discharge: 2021-08-05 | DRG: 374 | Discharge status: Against Medical Advice | Payer: Medicaid Other | Attending: Internal Medicine | Admitting: Internal Medicine

## 2021-07-31 ENCOUNTER — Encounter
Admission: EM | Discharge status: Against Medical Advice | Payer: Self-pay | Source: Home / Self Care | Attending: Internal Medicine

## 2021-07-31 ENCOUNTER — Encounter: Payer: Self-pay | Admitting: Anesthesiology

## 2021-07-31 ENCOUNTER — Inpatient Hospital Stay: Payer: Medicaid Other

## 2021-07-31 DIAGNOSIS — I10 Essential (primary) hypertension: Secondary | ICD-10-CM | POA: Diagnosis present

## 2021-07-31 DIAGNOSIS — J690 Pneumonitis due to inhalation of food and vomit: Secondary | ICD-10-CM | POA: Diagnosis present

## 2021-07-31 DIAGNOSIS — T381X6A Underdosing of thyroid hormones and substitutes, initial encounter: Secondary | ICD-10-CM | POA: Diagnosis present

## 2021-07-31 DIAGNOSIS — K709 Alcoholic liver disease, unspecified: Secondary | ICD-10-CM | POA: Diagnosis present

## 2021-07-31 DIAGNOSIS — E43 Unspecified severe protein-calorie malnutrition: Secondary | ICD-10-CM | POA: Diagnosis present

## 2021-07-31 DIAGNOSIS — F1010 Alcohol abuse, uncomplicated: Secondary | ICD-10-CM | POA: Diagnosis present

## 2021-07-31 DIAGNOSIS — F17200 Nicotine dependence, unspecified, uncomplicated: Secondary | ICD-10-CM | POA: Diagnosis present

## 2021-07-31 DIAGNOSIS — F1020 Alcohol dependence, uncomplicated: Secondary | ICD-10-CM | POA: Diagnosis present

## 2021-07-31 DIAGNOSIS — F1410 Cocaine abuse, uncomplicated: Secondary | ICD-10-CM | POA: Diagnosis present

## 2021-07-31 DIAGNOSIS — R946 Abnormal results of thyroid function studies: Secondary | ICD-10-CM | POA: Diagnosis present

## 2021-07-31 DIAGNOSIS — F17210 Nicotine dependence, cigarettes, uncomplicated: Secondary | ICD-10-CM | POA: Diagnosis present

## 2021-07-31 DIAGNOSIS — Z833 Family history of diabetes mellitus: Secondary | ICD-10-CM

## 2021-07-31 DIAGNOSIS — Z91138 Patient's unintentional underdosing of medication regimen for other reason: Secondary | ICD-10-CM

## 2021-07-31 DIAGNOSIS — Z20822 Contact with and (suspected) exposure to covid-19: Secondary | ICD-10-CM | POA: Diagnosis present

## 2021-07-31 DIAGNOSIS — F331 Major depressive disorder, recurrent, moderate: Secondary | ICD-10-CM | POA: Diagnosis present

## 2021-07-31 DIAGNOSIS — C154 Malignant neoplasm of middle third of esophagus: Principal | ICD-10-CM | POA: Diagnosis present

## 2021-07-31 DIAGNOSIS — F1910 Other psychoactive substance abuse, uncomplicated: Secondary | ICD-10-CM

## 2021-07-31 DIAGNOSIS — I959 Hypotension, unspecified: Secondary | ICD-10-CM | POA: Diagnosis not present

## 2021-07-31 DIAGNOSIS — Z8701 Personal history of pneumonia (recurrent): Secondary | ICD-10-CM

## 2021-07-31 DIAGNOSIS — K219 Gastro-esophageal reflux disease without esophagitis: Secondary | ICD-10-CM | POA: Diagnosis present

## 2021-07-31 DIAGNOSIS — Z681 Body mass index (BMI) 19 or less, adult: Secondary | ICD-10-CM | POA: Diagnosis not present

## 2021-07-31 DIAGNOSIS — Z79890 Hormone replacement therapy (postmenopausal): Secondary | ICD-10-CM

## 2021-07-31 DIAGNOSIS — R64 Cachexia: Secondary | ICD-10-CM | POA: Diagnosis present

## 2021-07-31 DIAGNOSIS — C159 Malignant neoplasm of esophagus, unspecified: Secondary | ICD-10-CM | POA: Diagnosis present

## 2021-07-31 DIAGNOSIS — Z800 Family history of malignant neoplasm of digestive organs: Secondary | ICD-10-CM

## 2021-07-31 DIAGNOSIS — E440 Moderate protein-calorie malnutrition: Secondary | ICD-10-CM

## 2021-07-31 DIAGNOSIS — K222 Esophageal obstruction: Secondary | ICD-10-CM | POA: Diagnosis present

## 2021-07-31 DIAGNOSIS — Z515 Encounter for palliative care: Secondary | ICD-10-CM

## 2021-07-31 DIAGNOSIS — R9431 Abnormal electrocardiogram [ECG] [EKG]: Secondary | ICD-10-CM | POA: Insufficient documentation

## 2021-07-31 DIAGNOSIS — K649 Unspecified hemorrhoids: Secondary | ICD-10-CM | POA: Diagnosis present

## 2021-07-31 DIAGNOSIS — E876 Hypokalemia: Secondary | ICD-10-CM | POA: Diagnosis not present

## 2021-07-31 DIAGNOSIS — F1310 Sedative, hypnotic or anxiolytic abuse, uncomplicated: Secondary | ICD-10-CM | POA: Diagnosis present

## 2021-07-31 DIAGNOSIS — R7401 Elevation of levels of liver transaminase levels: Secondary | ICD-10-CM | POA: Diagnosis present

## 2021-07-31 DIAGNOSIS — Z7401 Bed confinement status: Secondary | ICD-10-CM

## 2021-07-31 DIAGNOSIS — F3181 Bipolar II disorder: Secondary | ICD-10-CM | POA: Diagnosis present

## 2021-07-31 DIAGNOSIS — K861 Other chronic pancreatitis: Secondary | ICD-10-CM | POA: Diagnosis present

## 2021-07-31 DIAGNOSIS — E039 Hypothyroidism, unspecified: Secondary | ICD-10-CM | POA: Diagnosis present

## 2021-07-31 DIAGNOSIS — Z880 Allergy status to penicillin: Secondary | ICD-10-CM

## 2021-07-31 DIAGNOSIS — K2289 Other specified disease of esophagus: Secondary | ICD-10-CM

## 2021-07-31 DIAGNOSIS — E890 Postprocedural hypothyroidism: Secondary | ICD-10-CM | POA: Diagnosis present

## 2021-07-31 DIAGNOSIS — R042 Hemoptysis: Secondary | ICD-10-CM | POA: Diagnosis present

## 2021-07-31 DIAGNOSIS — D539 Nutritional anemia, unspecified: Secondary | ICD-10-CM | POA: Diagnosis present

## 2021-07-31 DIAGNOSIS — F17219 Nicotine dependence, cigarettes, with unspecified nicotine-induced disorders: Secondary | ICD-10-CM

## 2021-07-31 DIAGNOSIS — G9341 Metabolic encephalopathy: Secondary | ICD-10-CM | POA: Diagnosis present

## 2021-07-31 DIAGNOSIS — R001 Bradycardia, unspecified: Secondary | ICD-10-CM | POA: Diagnosis not present

## 2021-07-31 DIAGNOSIS — Z5329 Procedure and treatment not carried out because of patient's decision for other reasons: Secondary | ICD-10-CM | POA: Diagnosis not present

## 2021-07-31 DIAGNOSIS — Z8619 Personal history of other infectious and parasitic diseases: Secondary | ICD-10-CM

## 2021-07-31 DIAGNOSIS — G893 Neoplasm related pain (acute) (chronic): Secondary | ICD-10-CM

## 2021-07-31 DIAGNOSIS — K5909 Other constipation: Secondary | ICD-10-CM | POA: Diagnosis present

## 2021-07-31 DIAGNOSIS — Z7189 Other specified counseling: Secondary | ICD-10-CM

## 2021-07-31 DIAGNOSIS — Z8261 Family history of arthritis: Secondary | ICD-10-CM

## 2021-07-31 DIAGNOSIS — F419 Anxiety disorder, unspecified: Secondary | ICD-10-CM | POA: Diagnosis present

## 2021-07-31 DIAGNOSIS — F32A Depression, unspecified: Secondary | ICD-10-CM | POA: Diagnosis present

## 2021-07-31 DIAGNOSIS — Z8249 Family history of ischemic heart disease and other diseases of the circulatory system: Secondary | ICD-10-CM

## 2021-07-31 DIAGNOSIS — R451 Restlessness and agitation: Secondary | ICD-10-CM | POA: Diagnosis not present

## 2021-07-31 DIAGNOSIS — R1310 Dysphagia, unspecified: Secondary | ICD-10-CM

## 2021-07-31 LAB — CBC WITH DIFFERENTIAL/PLATELET
Abs Immature Granulocytes: 0.01 10*3/uL (ref 0.00–0.07)
Basophils Absolute: 0 10*3/uL (ref 0.0–0.1)
Basophils Relative: 1 %
Eosinophils Absolute: 0 10*3/uL (ref 0.0–0.5)
Eosinophils Relative: 1 %
HCT: 33.2 % — ABNORMAL LOW (ref 36.0–46.0)
Hemoglobin: 11 g/dL — ABNORMAL LOW (ref 12.0–15.0)
Immature Granulocytes: 0 %
Lymphocytes Relative: 26 %
Lymphs Abs: 1.1 10*3/uL (ref 0.7–4.0)
MCH: 34.5 pg — ABNORMAL HIGH (ref 26.0–34.0)
MCHC: 33.1 g/dL (ref 30.0–36.0)
MCV: 104.1 fL — ABNORMAL HIGH (ref 80.0–100.0)
Monocytes Absolute: 0.2 10*3/uL (ref 0.1–1.0)
Monocytes Relative: 4 %
Neutro Abs: 2.8 10*3/uL (ref 1.7–7.7)
Neutrophils Relative %: 68 %
Platelets: 338 10*3/uL (ref 150–400)
RBC: 3.19 MIL/uL — ABNORMAL LOW (ref 3.87–5.11)
RDW: 12.8 % (ref 11.5–15.5)
WBC: 4.1 10*3/uL (ref 4.0–10.5)
nRBC: 0 % (ref 0.0–0.2)

## 2021-07-31 LAB — CBC
HCT: 31.6 % — ABNORMAL LOW (ref 36.0–46.0)
HCT: 27.8 % — ABNORMAL LOW (ref 36.0–46.0)
Hemoglobin: 10.2 g/dL — ABNORMAL LOW (ref 12.0–15.0)
Hemoglobin: 8.8 g/dL — ABNORMAL LOW (ref 12.0–15.0)
MCH: 33.9 pg (ref 26.0–34.0)
MCH: 33.5 pg (ref 26.0–34.0)
MCHC: 32.3 g/dL (ref 30.0–36.0)
MCHC: 31.7 g/dL (ref 30.0–36.0)
MCV: 105 fL — ABNORMAL HIGH (ref 80.0–100.0)
MCV: 105.7 fL — ABNORMAL HIGH (ref 80.0–100.0)
Platelets: 330 10*3/uL (ref 150–400)
Platelets: 309 10*3/uL (ref 150–400)
RBC: 3.01 MIL/uL — ABNORMAL LOW (ref 3.87–5.11)
RBC: 2.63 MIL/uL — ABNORMAL LOW (ref 3.87–5.11)
RDW: 12.9 % (ref 11.5–15.5)
RDW: 13.1 % (ref 11.5–15.5)
WBC: 4.5 10*3/uL (ref 4.0–10.5)
WBC: 4.9 10*3/uL (ref 4.0–10.5)
nRBC: 0 % (ref 0.0–0.2)
nRBC: 0 % (ref 0.0–0.2)

## 2021-07-31 LAB — PROTIME-INR
INR: 1.1 (ref 0.8–1.2)
Prothrombin Time: 14.5 seconds (ref 11.4–15.2)

## 2021-07-31 LAB — COMPREHENSIVE METABOLIC PANEL
ALT: 26 U/L (ref 0–44)
AST: 77 U/L — ABNORMAL HIGH (ref 15–41)
Albumin: 3.9 g/dL (ref 3.5–5.0)
Alkaline Phosphatase: 50 U/L (ref 38–126)
Anion gap: 6 (ref 5–15)
BUN: <5 mg/dL — ABNORMAL LOW (ref 6–20)
CO2: 23 mmol/L (ref 22–32)
Calcium: 8.9 mg/dL (ref 8.9–10.3)
Chloride: 105 mmol/L (ref 98–111)
Creatinine, Ser: 0.81 mg/dL (ref 0.44–1.00)
GFR, Estimated: >60 mL/min (ref >60)
Glucose, Bld: 86 mg/dL (ref 70–99)
Potassium: 3.8 mmol/L (ref 3.5–5.1)
Sodium: 134 mmol/L — ABNORMAL LOW (ref 135–145)
Total Bilirubin: 1.3 mg/dL — ABNORMAL HIGH (ref 0.3–1.2)
Total Protein: 8.5 g/dL — ABNORMAL HIGH (ref 6.5–8.1)

## 2021-07-31 LAB — TYPE AND SCREEN
ABO/RH(D): O POS
ABO/RH(D): O POS
Antibody Screen: NEGATIVE
Antibody Screen: NEGATIVE

## 2021-07-31 LAB — CBG MONITORING, ED
Glucose-Capillary: 76 mg/dL (ref 70–99)
Glucose-Capillary: 70 mg/dL (ref 70–99)

## 2021-07-31 LAB — RESP PANEL BY RT-PCR (FLU A&B, COVID) ARPGX2
Influenza A by PCR: NEGATIVE
Influenza B by PCR: NEGATIVE
SARS Coronavirus 2 by RT PCR: NEGATIVE

## 2021-07-31 LAB — ETHANOL: Alcohol, Ethyl (B): <10 mg/dL (ref <10)

## 2021-07-31 LAB — CEA: CEA: 8.1 ng/mL — ABNORMAL HIGH (ref 0.0–4.7)

## 2021-07-31 LAB — APTT: aPTT: 39 seconds — ABNORMAL HIGH (ref 24–36)

## 2021-07-31 LAB — AMMONIA: Ammonia: 17 umol/L (ref 9–35)

## 2021-07-31 LAB — MAGNESIUM: Magnesium: 2.1 mg/dL (ref 1.7–2.4)

## 2021-07-31 SURGERY — INSERTION, GASTROSTOMY TUBE, ROBOT-ASSISTED
Anesthesia: General

## 2021-07-31 MED ORDER — PANTOPRAZOLE SODIUM 40 MG IV SOLR
40.0000 mg | Freq: Two times a day (BID) | INTRAVENOUS | Status: DC
  Administered 2021-07-31 – 2021-08-04 (×9): 40 mg via INTRAVENOUS
  Filled 2021-07-31 (×9): qty 40

## 2021-07-31 MED ORDER — LEVOTHYROXINE SODIUM 100 MCG/5ML IV SOLN
100.0000 ug | Freq: Every day | INTRAVENOUS | Status: DC
  Administered 2021-07-31 – 2021-08-05 (×6): 100 ug via INTRAVENOUS
  Filled 2021-07-31 (×7): qty 5

## 2021-07-31 MED ORDER — GLUCAGON HCL RDNA (DIAGNOSTIC) 1 MG IJ SOLR
INTRAMUSCULAR | Status: AC
  Filled 2021-07-31: qty 1

## 2021-07-31 MED ORDER — LORAZEPAM 2 MG PO TABS: 0.0000 mg | ORAL_TABLET | Freq: Four times a day (QID) | ORAL | Status: DC

## 2021-07-31 MED ORDER — THIAMINE HCL 100 MG/ML IJ SOLN
100.0000 mg | Freq: Every day | INTRAMUSCULAR | Status: DC
  Administered 2021-07-31 – 2021-08-04 (×5): 100 mg via INTRAVENOUS
  Filled 2021-07-31 (×6): qty 2

## 2021-07-31 MED ORDER — HYDROMORPHONE HCL 1 MG/ML IJ SOLN
0.5000 mg | INTRAMUSCULAR | Status: DC | PRN
  Administered 2021-07-31 – 2021-08-01 (×5): 0.5 mg via INTRAVENOUS
  Filled 2021-07-31 (×5): qty 1

## 2021-07-31 MED ORDER — SODIUM CHLORIDE 0.9 % IV SOLN
1.0000 g | Freq: Once | INTRAVENOUS | Status: AC
  Administered 2021-07-31: 1 g via INTRAVENOUS
  Filled 2021-07-31: qty 10

## 2021-07-31 MED ORDER — SODIUM CHLORIDE 0.9 % IV SOLN: INTRAVENOUS | Status: DC

## 2021-07-31 MED ORDER — DIPHENHYDRAMINE HCL 50 MG/ML IJ SOLN
25.0000 mg | Freq: Three times a day (TID) | INTRAMUSCULAR | Status: DC | PRN
  Administered 2021-08-01: 09:00:00 25 mg via INTRAVENOUS
  Filled 2021-07-31 (×2): qty 1

## 2021-07-31 MED ORDER — GLUCAGON HCL RDNA (DIAGNOSTIC) 1 MG IJ SOLR: 0.5000 mg | INTRAMUSCULAR | Status: AC

## 2021-07-31 MED ORDER — NICOTINE 21 MG/24HR TD PT24
21.0000 mg | MEDICATED_PATCH | Freq: Every day | TRANSDERMAL | Status: DC
  Administered 2021-08-01 – 2021-08-04 (×4): 21 mg via TRANSDERMAL
  Filled 2021-07-31 (×4): qty 1

## 2021-07-31 MED ORDER — LORAZEPAM 2 MG/ML IJ SOLN
0.0000 mg | Freq: Two times a day (BID) | INTRAMUSCULAR | Status: AC
Start: 2021-08-02 — End: 2021-08-04
  Administered 2021-08-02 – 2021-08-03 (×2): 1 mg via INTRAVENOUS
  Administered 2021-08-03: 04:00:00 2 mg via INTRAVENOUS
  Administered 2021-08-04: 1 mg via INTRAVENOUS
  Filled 2021-07-31 (×4): qty 1

## 2021-07-31 MED ORDER — METRONIDAZOLE 500 MG/100ML IV SOLN
500.0000 mg | Freq: Three times a day (TID) | INTRAVENOUS | Status: DC
  Administered 2021-07-31 – 2021-08-01 (×3): 500 mg via INTRAVENOUS
  Filled 2021-07-31 (×5): qty 100

## 2021-07-31 MED ORDER — DM-GUAIFENESIN ER 30-600 MG PO TB12: 1.0000 | ORAL_TABLET | Freq: Two times a day (BID) | ORAL | Status: DC | PRN

## 2021-07-31 MED ORDER — LORAZEPAM 2 MG/ML IJ SOLN
0.0000 mg | Freq: Four times a day (QID) | INTRAMUSCULAR | Status: AC
Start: 2021-07-31 — End: 2021-08-02
  Administered 2021-07-31: 2 mg via INTRAVENOUS
  Administered 2021-07-31 – 2021-08-02 (×5): 1 mg via INTRAVENOUS
  Filled 2021-07-31 (×6): qty 1

## 2021-07-31 MED ORDER — LORAZEPAM 2 MG PO TABS: 0.0000 mg | ORAL_TABLET | Freq: Two times a day (BID) | ORAL | Status: AC

## 2021-07-31 MED ORDER — THIAMINE HCL 100 MG PO TABS: 100.0000 mg | ORAL_TABLET | Freq: Every day | ORAL | Status: DC

## 2021-07-31 MED ORDER — HYDRALAZINE HCL 20 MG/ML IJ SOLN: 5.0000 mg | INTRAMUSCULAR | Status: DC | PRN

## 2021-07-31 MED ORDER — METRONIDAZOLE 500 MG/100ML IV SOLN
500.0000 mg | Freq: Once | INTRAVENOUS | Status: AC
  Administered 2021-07-31: 500 mg via INTRAVENOUS
  Filled 2021-07-31: qty 100

## 2021-07-31 MED ORDER — FENTANYL CITRATE PF 50 MCG/ML IJ SOSY: 50.0000 ug | PREFILLED_SYRINGE | Freq: Once | INTRAMUSCULAR | Status: DC

## 2021-07-31 MED ORDER — PANTOPRAZOLE 80MG IVPB - SIMPLE MED
80.0000 mg | Freq: Once | INTRAVENOUS | Status: AC
  Administered 2021-07-31: 80 mg via INTRAVENOUS
  Filled 2021-07-31: qty 100

## 2021-07-31 MED ORDER — ONDANSETRON HCL 4 MG/2ML IJ SOLN: 4.0000 mg | Freq: Once | INTRAMUSCULAR | Status: DC

## 2021-07-31 MED ORDER — SODIUM CHLORIDE 0.9 % IV SOLN
2.0000 g | INTRAVENOUS | Status: DC
  Administered 2021-08-01 – 2021-08-05 (×5): 2 g via INTRAVENOUS
  Filled 2021-07-31: qty 2
  Filled 2021-07-31 (×3): qty 20
  Filled 2021-07-31: qty 2
  Filled 2021-07-31 (×2): qty 20

## 2021-07-31 MED ORDER — DEXTROSE-NACL 5-0.9 % IV SOLN: INTRAVENOUS | Status: DC

## 2021-07-31 SURGICAL SUPPLY — 47 items
ADH SKN CLS APL DERMABOND .7 (GAUZE/BANDAGES/DRESSINGS) ×1
APL PRP STRL LF DISP 70% ISPRP (MISCELLANEOUS) ×1
BAG SPEC RTRVL LRG 6X4 10 (ENDOMECHANICALS) ×1
CANNULA REDUC XI 12-8 STAPL (CANNULA)
CANNULA REDUCER 12-8 DVNC XI (CANNULA) IMPLANT
CHLORAPREP W/TINT 26 (MISCELLANEOUS) ×3 IMPLANT
COVER TIP SHEARS 8 DVNC (MISCELLANEOUS) ×2 IMPLANT
COVER TIP SHEARS 8MM DA VINCI (MISCELLANEOUS) ×1
DECANTER SPIKE VIAL GLASS SM (MISCELLANEOUS) ×3 IMPLANT
DEFOGGER SCOPE WARMER CLEARIFY (MISCELLANEOUS) ×3 IMPLANT
DERMABOND ADVANCED (GAUZE/BANDAGES/DRESSINGS) ×1
DERMABOND ADVANCED .7 DNX12 (GAUZE/BANDAGES/DRESSINGS) ×2 IMPLANT
DRAPE ARM DVNC X/XI (DISPOSABLE) ×8 IMPLANT
DRAPE COLUMN DVNC XI (DISPOSABLE) ×2 IMPLANT
DRAPE DA VINCI XI ARM (DISPOSABLE) ×4
DRAPE DA VINCI XI COLUMN (DISPOSABLE) ×1
ELECT REM PT RETURN 9FT ADLT (ELECTROSURGICAL) ×2
ELECTRODE REM PT RTRN 9FT ADLT (ELECTROSURGICAL) ×2 IMPLANT
GAUZE 4X4 16PLY ~~LOC~~+RFID DBL (SPONGE) ×3 IMPLANT
GLOVE SURG ORTHO LTX SZ7.5 (GLOVE) ×9 IMPLANT
GOWN STRL REUS W/ TWL LRG LVL3 (GOWN DISPOSABLE) ×8 IMPLANT
GOWN STRL REUS W/TWL LRG LVL3 (GOWN DISPOSABLE) ×8
GRASPER SUT TROCAR 14GX15 (MISCELLANEOUS) IMPLANT
IRRIGATION STRYKERFLOW (MISCELLANEOUS) IMPLANT
IRRIGATOR STRYKERFLOW (MISCELLANEOUS)
IV NS 1000ML (IV SOLUTION)
IV NS 1000ML BAXH (IV SOLUTION) IMPLANT
KIT PINK PAD W/HEAD ARE REST (MISCELLANEOUS) ×2
KIT PINK PAD W/HEAD ARM REST (MISCELLANEOUS) ×2 IMPLANT
KIT TURNOVER KIT A (KITS) ×3 IMPLANT
MANIFOLD NEPTUNE II (INSTRUMENTS) ×3 IMPLANT
NEEDLE HYPO 22GX1.5 SAFETY (NEEDLE) ×3 IMPLANT
NS IRRIG 500ML POUR BTL (IV SOLUTION) ×3 IMPLANT
PACK LAP CHOLECYSTECTOMY (MISCELLANEOUS) ×3 IMPLANT
POUCH SPECIMEN RETRIEVAL 10MM (ENDOMECHANICALS) ×3 IMPLANT
SEAL CANN UNIV 5-8 DVNC XI (MISCELLANEOUS) ×6 IMPLANT
SEAL XI 5MM-8MM UNIVERSAL (MISCELLANEOUS) ×3
STAPLER CANNULA SEAL DVNC XI (STAPLE) IMPLANT
STAPLER CANNULA SEAL XI (STAPLE)
SUT MNCRL 4-0 (SUTURE) ×2
SUT MNCRL 4-0 27XMFL (SUTURE) ×1
SUT VICRYL 0 AB UR-6 (SUTURE) ×3 IMPLANT
SUTURE MNCRL 4-0 27XMF (SUTURE) ×2 IMPLANT
TROCAR XCEL 12X100 BLDLESS (ENDOMECHANICALS) ×3 IMPLANT
TROCAR Z-THREAD FIOS 11X100 BL (TROCAR) ×3 IMPLANT
TUBING INSUFFLATION (TUBING) ×3 IMPLANT
WATER STERILE IRR 500ML POUR (IV SOLUTION) ×3 IMPLANT

## 2021-07-31 NOTE — Consult Note (Signed)
Hematology/Oncology Consult note [facility] Telephone:([phone] [phone]  Patient Care Team: Venita Lick, NP as PCP - General (Nurse Practitioner) Vanita Ingles, RN as Registered Nurse (General Practice)   Name of the patient: [name]  [id number]  March 16, 1966   Date of visit: 07/31/21 REASON FOR COSULTATION:  Esophageal cancer History of presenting illness-  55 y.o. female with PMH listed at below who presents to ER for evaluation of hemoptysis. Patient was seen by me during her most recent admission from 11/13-11/15. Patient had IR biopsy neck mass which showed squamous cell carcinoma. Patient left AMA. Patient also has aspiration pneumonia. Hematology oncology was consulted for further evaluation. Patient was lethargic when I was at bedside.  There was plan of feeding tube placement this afternoon which was aborted.  There was some concern about patient being intoxicated.  EtOH was negative.  It was felt that mental status was secondary to patient being medicated for CIWA. I talked with husband who reports due to history of intermittent blood when she coughs. + Weight loss Patient and husband do not have children.  Patient has a daughter.  She also has an aunt, who per the husband is patient's next of kin.   Review of Systems  Unable to perform ROS: Mental status change  HENT:          Neck mass  Respiratory:  Positive for hemoptysis.   Gastrointestinal:        Dysphagia   Allergies  Allergen Reactions   Penicillins Rash and Other (See Comments)    Has patient had a PCN reaction causing immediate rash, facial/tongue/throat swelling, SOB or lightheadedness with hypotension: No Has patient had a PCN reaction causing severe rash involving mucus membranes or skin necrosis: No Has patient had a PCN reaction that required hospitalization No Has patient had a PCN reaction occurring within the last 10 years: No If all of the above  answers are "NO", then may proceed with Cephalosporin use.    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   GERD (gastroesophageal reflux disease) 07/31/2021   Macrocytic anemia 07/31/2021   Protein-calorie malnutrition, moderate (HCC) 07/31/2021   Prolonged Q-T interval on ECG    Aspiration pneumonia ([address]) 07/28/2021   Hypoglycemia 07/28/2021   Transaminitis 07/28/2021   Dysphagia 07/28/2021   Neck mass 07/28/2021   Hypomagnesemia 07/28/2021   Hypokalemia 07/28/2021   Bipolar 2 disorder ([place]) 07/27/2018   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   Chronic pain syndrome 07/20/2015   COPD (chronic obstructive pulmonary disease) ([place]) 07/20/2015   Hepatitis C 07/20/2015   Alcohol use disorder, severe, dependence ([place]) 03/22/2015   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/2015   Chronic pancreatitis ([place]) 03/18/2015   Hypothyroidism 03/18/2015   Hypertension 03/18/2013   Nicotine dependence 01/14/2011   Mixed, or nondependent drug abuse, in remission ([place]) 12/26/2010     Past Medical History:  Diagnosis Date   Alcohol abuse    Asthma    Cancer ([place])    "carcinoma of throat" with s/p thyroidectomy   Chronic pain syndrome    Chronic pancreatitis (HCC)    COPD (chronic obstructive pulmonary disease) ([place])    Depression    Hepatitis C    History of alcohol abuse    History of drug use    Hypertension 03/18/2013   Hypothyroidism  Low back pain    Pancreatic pseudocyst 03/18/2015   Seizures ([place])    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     Past Surgical History:  Procedure Laterality Date   CESAREAN SECTION     GASTROSTOMY TUBE PLACEMENT  2011   TOTAL THYROIDECTOMY      Social History   Socioeconomic History   Marital status: Single    Spouse name: Not on file   Number of children: Not on file   Years of  education: Not on file   Highest education level: Not on file  Occupational History   Not on file  Tobacco Use   Smoking status: Every Day    Packs/day: 0.25    Types: Cigarettes   Smokeless tobacco: Never  Vaping Use   Vaping Use: Never used  Substance and Sexual Activity   Alcohol use: Yes    Alcohol/week: 0.0 standard drinks    Comment: pt states she has a drink every day   Drug use: Yes    Types: Marijuana, Cocaine    Comment: History of drug use   Sexual activity: Not on file  Other Topics Concern   Not on file  Social History Narrative   Not on file   Social Determinants of Health   Financial Resource Strain: High Risk   Difficulty of Paying Living Expenses: Very hard  Food Insecurity: No Food Insecurity   Worried About Charity fundraiser in the Last Year: Never true   Ran Out of Food in the Last Year: Never true  Transportation Needs: No Transportation Needs   Lack of Transportation (Medical): No   Lack of Transportation (Non-Medical): No  Physical Activity: Inactive   Days of Exercise per Week: 0 days   Minutes of Exercise per Session: 0 min  Stress: Stress Concern Present   Feeling of Stress : Very much  Social Connections: Socially Isolated   Frequency of Communication with Friends and Family: Never   Frequency of Social Gatherings with Friends and Family: Never   Attends Religious Services: Never   Marine scientist or Organizations: No   Attends Music therapist: Never   Marital Status: Married  Human resources officer Violence: Not At Risk   Fear of Current or Ex-Partner: No   Emotionally Abused: No   Physically Abused: No   Sexually Abused: No     Family History  Problem Relation Age of Onset   Diabetes Mellitus II Maternal Grandfather    CAD Maternal Grandfather    Diabetes Mellitus II Mother    Liver cancer Mother    Arthritis Mother    Cancer Mother        lung   Cancer Father    Thyroid disease Son    Diabetes Mellitus II  Maternal Aunt    Pancreatic cancer Paternal Aunt      Current Facility-Administered Medications:    [START ON 08/01/2021] cefTRIAXone (ROCEPHIN) 2 g in sodium chloride 0.9 % 100 mL IVPB, 2 g, Intravenous, Q24H, Niu, Xilin, MD   dextromethorphan-guaiFENesin (Eclectic DM) 30-600 MG per 12 hr tablet 1 tablet, 1 tablet, Oral, BID PRN, Ivor Costa, MD   dextrose 5 %-0.9 % sodium chloride infusion, , Intravenous, Continuous, Ivor Costa, MD, Last Rate: 100 mL/hr at 07/31/21 1153, New Bag at 07/31/21 1153   diphenhydrAMINE (BENADRYL) injection 25 mg, 25 mg, Intravenous, Q8H PRN, Ivor Costa, MD   hydrALAZINE (APRESOLINE) injection 5 mg, 5 mg, Intravenous, Q2H PRN, Ivor Costa, MD   HYDROmorphone (  DILAUDID) injection 0.5 mg, 0.5 mg, Intravenous, Q4H PRN, Ivor Costa, MD, 0.5 mg at 07/31/21 1153   levothyroxine (SYNTHROID, LEVOTHROID) injection 100 mcg, 100 mcg, Intravenous, Daily, Ivor Costa, MD, 100 mcg at 07/31/21 1130   LORazepam (ATIVAN) injection 0-4 mg, 0-4 mg, Intravenous, Q6H, 2 mg at 07/31/21 0639 **OR** [DISCONTINUED] LORazepam (ATIVAN) tablet 0-4 mg, 0-4 mg, Oral, Q6H, Ward, Kristen N, DO   [START ON 08/02/2021] LORazepam (ATIVAN) injection 0-4 mg, 0-4 mg, Intravenous, Q12H **OR** [START ON 08/02/2021] LORazepam (ATIVAN) tablet 0-4 mg, 0-4 mg, Oral, Q12H, Ivor Costa, MD   metroNIDAZOLE (FLAGYL) IVPB 500 mg, 500 mg, Intravenous, Q8H, Ivor Costa, MD, Last Rate: 100 mL/hr at 07/31/21 1600, 500 mg at 07/31/21 1600   nicotine (NICODERM CQ - dosed in mg/24 hours) patch 21 mg, 21 mg, Transdermal, Daily, Ivor Costa, MD   pantoprazole (PROTONIX) injection 40 mg, 40 mg, Intravenous, Q12H, Ivor Costa, MD   [DISCONTINUED] thiamine tablet 100 mg, 100 mg, Oral, Daily **OR** thiamine (B-1) injection 100 mg, 100 mg, Intravenous, Daily, Ivor Costa, MD, 100 mg at 07/31/21 1153  Current Outpatient Medications:    albuterol (VENTOLIN HFA) 108 (90 Base) MCG/ACT inhaler, Inhale 1-2 puffs into the lungs every 4 (four)  hours as needed for wheezing or shortness of breath. (Patient not taking: No sig reported), Disp: 8.5 g, Rfl: 2   levothyroxine (SYNTHROID) 125 MCG tablet, Take 1 tablet (125 mcg total) by mouth daily. (Patient not taking: No sig reported), Disp: 30 tablet, Rfl: 0   QUEtiapine (SEROQUEL XR) 50 MG TB24 24 hr tablet, Take 1 tablet (50 mg total) by mouth at bedtime. (Patient not taking: No sig reported), Disp: 30 tablet, Rfl: 0   Physical exam:  Vitals:   07/31/21 0630 07/31/21 1000 07/31/21 1200 07/31/21 1227  BP: (!) 157/88 115/88 115/86   Pulse: 74 78 84 81  Resp:  17 14 11   Temp:      TempSrc:      SpO2:  96% 96% 94%  Weight:      Height:       Physical Exam Constitutional:      General: She is not in acute distress.    Appearance: She is ill-appearing. She is not diaphoretic.  HENT:     Head: Normocephalic and atraumatic.     Nose: Nose normal.     Mouth/Throat:     Pharynx: No oropharyngeal exudate.  Eyes:     General: No scleral icterus.    Pupils: Pupils are equal, round, and reactive to light.  Neck:     Comments: Palpable neck mass Cardiovascular:     Rate and Rhythm: Normal rate.  Pulmonary:     Effort: Pulmonary effort is normal.  Abdominal:     General: Abdomen is flat.  Musculoskeletal:        General: Normal range of motion.     Cervical back: Normal range of motion.  Skin:    Findings: No erythema.  Neurological:     Motor: No abnormal muscle tone.     Comments: Lethargic, not able to answer questions.  Psychiatric:        Mood and Affect: Affect normal.        CMP Latest Ref Rng & Units 07/31/2021  Glucose 70 - 99 mg/dL 86  BUN 6 - 20 mg/dL <5(L)  Creatinine 0.44 - 1.00 mg/dL 0.81  Sodium 135 - 145 mmol/L 134(L)  Potassium 3.5 - 5.1 mmol/L 3.8  Chloride 98 - 111 mmol/L  105  CO2 22 - 32 mmol/L 23  Calcium 8.9 - 10.3 mg/dL 8.9  Total Protein 6.5 - 8.1 g/dL 8.5(H)  Total Bilirubin 0.3 - 1.2 mg/dL 1.3(H)  Alkaline Phos 38 - 126 U/L 50  AST 15  - 41 U/L 77(H)  ALT 0 - 44 U/L 26   CBC Latest Ref Rng & Units 07/31/2021  WBC 4.0 - 10.5 K/uL 4.5  Hemoglobin 12.0 - 15.0 g/dL 10.2(L)  Hematocrit 36.0 - 46.0 % 31.6(L)  Platelets 150 - 400 K/uL 330    RADIOGRAPHIC STUDIES: I have personally reviewed the radiological images as listed and agreed with the findings in the report. CT Soft Tissue Neck W Contrast  Result Date: 07/28/2021 CLINICAL DATA:  Abdominal pain for 4 months, worsening. History of alcohol abuse. Neck abscess, deep tissue. EXAM: CT NECK WITH CONTRAST TECHNIQUE: Multidetector CT imaging of the neck was performed using the standard protocol following the bolus administration of intravenous contrast. CONTRAST:  6mL OMNIPAQUE IOHEXOL 300 MG/ML  SOLN COMPARISON:  None. FINDINGS: Pharynx, larynx, and esophagus: At the level of the thoracic inlet is a large mass measuring up to 6.7 cm, displacing the carotid sheath anteriorly, the trachea anteriorly and leftward, in the esophagus leftward. Margins with the right wall of the esophagus are indistinct and there is a gas channel in the region of the expected esophageal wall. History of abscess but the mass appears more solid and necrotic appearing with limited regional fat edema and encasement of vessels including the right proximal vertebral artery, right common carotid, and proximal right subclavian. Burtis Junes an exophytic esophageal mass. Salivary glands: No inflammation, mass, or stone. Thyroid: Atrophic if not resected. Lymph nodes: 10 mm left level 4 lymph node. Vascular: Vessel encasement as above. No acute vascular finding. Atheromatous calcification. Limited intracranial: Negative Visualized orbits: Negative Mastoids and visualized paranasal sinuses: Clear Skeleton: Cervical spine degeneration Upper chest: Apical emphysema. IMPRESSION: 1. Nearly 7 cm mass at the thoracic inlet, favor locally advanced esophageal cancer with encasement of major vessels. The mass approaches the skin surface  and may be amenable to percutaneous biopsy. 2. Single mildly enlarged left level 4 lymph node. Electronically Signed   By: Jorje Guild M.D.   On: 07/28/2021 05:48   CT Angio Chest PE W and/or Wo Contrast  Addendum Date: 07/28/2021   ADDENDUM REPORT: 07/28/2021 06:35 ADDENDUM: Omitted impression #3 of ascending aortic aneurysm (measuring 4.1 cm in diameter) significance to be determined by workup of the patient's neck mass. Electronically Signed   By: Jorje Guild M.D.   On: 07/28/2021 06:35   Result Date: 07/28/2021 CLINICAL DATA:  Abdominal distension in the left upper quadrant. Abdominal pain. EXAM: CT ANGIOGRAPHY CHEST CT ABDOMEN AND PELVIS WITH CONTRAST TECHNIQUE: Multidetector CT imaging of the chest was performed using the standard protocol during bolus administration of intravenous contrast. Multiplanar CT image reconstructions and MIPs were obtained to evaluate the vascular anatomy. Multidetector CT imaging of the abdomen and pelvis was performed using the standard protocol during bolus administration of intravenous contrast. CONTRAST:  54mL OMNIPAQUE IOHEXOL 300 MG/ML  SOLN COMPARISON:  Abdominal CT 11/28/2015 FINDINGS: CTA CHEST FINDINGS Cardiovascular: Cardiomegaly. Small to moderate low-density pericardial effusion measuring up to 11 mm in thickness. Aneurysmal ascending aorta measuring up to 4.1 cm in diameter. Mediastinum/Nodes: Mass at the thoracic inlet and upper mediastinum as described on dedicated neck CT no lymphadenopathy seen in the mediastinum. Lungs/Pleura: Airway thickening with debris in the right sided airways there is associated with airspace  opacity in the inferior and posterior right upper lobe. Mild atelectasis in the medial right middle lobe and lingula. Musculoskeletal: Scoliosis. Review of the MIP images confirms the above findings. CT ABDOMEN and PELVIS FINDINGS Hepatobiliary: No focal liver abnormality.Prominence of intrahepatic bile ducts. No calcified  cholelithiasis. Pancreas: Unremarkable. Spleen: Unremarkable. Adrenals/Urinary Tract: Negative adrenals. No hydronephrosis or stone. Unremarkable bladder. Stomach/Bowel:  No obstruction. No appendicitis. 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 There is also chronic occlusion of the distal splenic vein. Numerous upper abdominal collaterals. Atheromatous calcification of the aorta and iliacs. No mass or adenopathy. Reproductive:No pathologic findings. Other: No ascites or pneumoperitoneum. Musculoskeletal: No acute abnormalities. Review of the MIP images confirms the above findings. IMPRESSION: Chest CTA: 1. Large mass at the thoracic inlet, reference neck CT. 2. Extensive debris in right-sided airways with right upper lobe opacity likely reflecting pneumonia or aspiration. Abdominal CT: 1. No acute finding. 2. 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 Electronically Signed: By: Jorje Guild M.D. On: 07/28/2021 05:56   CT ABDOMEN PELVIS W CONTRAST  Addendum Date: 07/28/2021   ADDENDUM REPORT: 07/28/2021 06:35 ADDENDUM: Omitted impression #3 of ascending aortic aneurysm (measuring 4.1 cm in diameter) significance to be determined by workup of the patient's neck mass. Electronically Signed   By: Jorje Guild M.D.   On: 07/28/2021 06:35   Result Date: 07/28/2021 CLINICAL DATA:  Abdominal distension in the left upper quadrant. Abdominal pain. EXAM: CT ANGIOGRAPHY CHEST CT ABDOMEN AND PELVIS WITH CONTRAST TECHNIQUE: Multidetector CT imaging of the chest was performed using the standard protocol during bolus administration of intravenous contrast. Multiplanar CT image reconstructions and MIPs were obtained to evaluate the vascular anatomy. Multidetector CT imaging of the abdomen and pelvis was performed using the standard protocol during bolus administration of intravenous contrast. CONTRAST:  51mL OMNIPAQUE IOHEXOL 300 MG/ML   SOLN COMPARISON:  Abdominal CT 11/28/2015 FINDINGS: CTA CHEST FINDINGS Cardiovascular: Cardiomegaly. Small to moderate low-density pericardial effusion measuring up to 11 mm in thickness. Aneurysmal ascending aorta measuring up to 4.1 cm in diameter. Mediastinum/Nodes: Mass at the thoracic inlet and upper mediastinum as described on dedicated neck CT no lymphadenopathy seen in the mediastinum. Lungs/Pleura: Airway thickening with debris in the right sided airways there is associated with airspace opacity in the inferior and posterior right upper lobe. Mild atelectasis in the medial right middle lobe and lingula. Musculoskeletal: Scoliosis. Review of the MIP images confirms the above findings. CT ABDOMEN and PELVIS FINDINGS Hepatobiliary: No focal liver abnormality.Prominence of intrahepatic bile ducts. No calcified cholelithiasis. Pancreas: Unremarkable. Spleen: Unremarkable. Adrenals/Urinary Tract: Negative adrenals. No hydronephrosis or stone. Unremarkable bladder. Stomach/Bowel:  No obstruction. No appendicitis. 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 There is also chronic occlusion of the distal splenic vein. Numerous upper abdominal collaterals. Atheromatous calcification of the aorta and iliacs. No mass or adenopathy. Reproductive:No pathologic findings. Other: No ascites or pneumoperitoneum. Musculoskeletal: No acute abnormalities. Review of the MIP images confirms the above findings. IMPRESSION: Chest CTA: 1. Large mass at the thoracic inlet, reference neck CT. 2. Extensive debris in right-sided airways with right upper lobe opacity likely reflecting pneumonia or aspiration. Abdominal CT: 1. No acute finding. 2. 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 Electronically Signed: By: Jorje Guild M.D. On: 07/28/2021 05:56   DG Chest Portable 1 View  Result Date: 07/31/2021 CLINICAL DATA:  55 year old female with cancer at  the thoracic inlet, coughing and vomiting blood. Aspiration. EXAM:  PORTABLE CHEST 1 VIEW COMPARISON:  Chest CTA 07/28/2021 and earlier. FINDINGS: Portable AP upright view at 0540 hours. Lung volumes remain normal. Heart size and central mediastinal contours remain within normal limits. Vague increased right paratracheal density corresponds to indistinct right thoracic inlet soft tissue mass seen by CT. Mild leftward deviation of the trachea but no significant tracheal narrowing. Patchy, spiculated mid right lung opacity as seen recently by CT. But otherwise allowing for portable technique the lungs are clear. No acute osseous abnormality identified. Negative visible bowel gas. IMPRESSION: 1. Patchy right mid lung opacity stable from the recent CT. 2. Vague increased right paratracheal density corresponding to large right thoracic inlet mass. 3. No new cardiopulmonary abnormality. Electronically Signed   By: Genevie Ann M.D.   On: 07/31/2021 05:52   [place] CORE BIOPSY (SOFT TISSUE)  Result Date: 07/29/2021 INDICATION: Large neck mass at the thoracic inlet favored to be advanced esophageal cancer, dysphagia EXAM: ULTRASOUND CORE BIOPSY LARGE RIGHT NECK MASS MEDICATIONS: 1% LIDOCAINE LOCAL ANESTHESIA/SEDATION: None. Total intra-service moderate Sedation Time: 0 minutes. The patient's level of consciousness and vital signs were monitored continuously by radiology nursing throughout the procedure under my direct supervision. FLUOROSCOPY TIME:  Fluoroscopy Time: None. COMPLICATIONS: None immediate. PROCEDURE: Informed written consent was obtained from the patient after a thorough discussion of the procedural risks, benefits and alternatives. All questions were addressed. Maximal Sterile Barrier Technique was utilized including caps, mask, sterile gowns, sterile gloves, sterile drape, hand hygiene and skin antiseptic. A timeout was performed prior to the initiation of the procedure. Previous imaging reviewed. The large  predominately right neck mass was localized and marked just medial to the right carotid artery. Under sterile conditions and local anesthesia, an 18 gauge core biopsy needle was advanced into the right neck mass. Needle position confirmed with ultrasound. Images obtained for documentation. 3 18 gauge core biopsies obtained. Samples were intact and non fragmented. These were placed in formalin. Needle removed. Postprocedure imaging demonstrates no hemorrhage or hematoma. Patient tolerated the biopsy well. IMPRESSION: Successful ultrasound right neck mass 18 gauge core biopsies. Electronically Signed   By: Jerilynn Mages.  Shick M.D.   On: 07/29/2021 13:34    Assessment and plan-   #Neck mass status post IR biopsy.  Pathology is consistent with squamous cell carcinoma.  I discussed with pathology, tissue origin was not able to be identified further.  Given the proximity is to esophageal, clinically she has squamous carcinoma of esophagus.  Other differential includes squamous cell carcinoma of head and neck. No distant metastasis on CT scan. Recommend brain MRI with and without contrast to complete staging. I had a lengthy discussion with patient's husband at bedside today.  Stent of care will be concurrent chemotherapy and radiation.  However patient has mood disorder, including anxiety/bipolar disorder,  she may have cognitive impairment, due to chronic alcohol use.  I recommend psychiatry evaluation for determination of competence and treatment of her underlying mood disorders.  Husband also feels that there is some change about patient's personality. Consult palliative care service.  #Hemoptysis versus hematemesis. It concerns me that the large mass is encasing vessels. I discussed with patient's husband that sometimes with treatment-chemo/radiation, patient may develop massive bleeding due to tumor shrinkage.  I recommend to consult vascular surgery for evaluation of feasibility of embolization.   #Dysphagia,  weight loss, malnutrition, Recommend feeding tube placement. #Chronic alcohol use, watch for alcohol withdrawal #Aspiration pneumonia, continue antibiotics.   Thank you for allowing me to participate in the care  of this patient.  Total face to face encounter time for this patient visit was 70 min. >50% of the time was  spent in counseling and coordination of care.    Earlie Server, MD, PhD 07/31/2021

## 2021-07-31 NOTE — H&P (Addendum)
History and Physical    [name]:[id number] DOB: 04-18-1966 DOA: 07/31/2021  Referring MD/NP/PA:   PCP: Venita Lick, NP   Patient coming from:  The patient is coming from home.  At baseline, pt is independent for most of ADL.        Chief Complaint: Hemoptysis  HPI: [name] is a 55 y.o. female with medical history significant of chronic pancreatitis, hypothyroidism, thyroidectomy over 30 years ago, alcohol abuse, tobacco abuse, bipolar disorder, hepatitis C, nicotine dependence, COPD, neck mass with newly diagnosed esophageal squamous cell carcinoma, who presents with hemoptysis.  Patient was recently hospitalized from 11/13-11/15. Due to a large neck mass, pt had biopsy by IR, which showed squamous cell carcinoma of the esophagus.  Patient also had aspiration pneumonia, and treated with Rocephin and Flagyl, but the patient left hospital AMA. Per her husband, pt continues to cough with dark red bloody material coming out, patient thinks that the blood is from coughing. She also has shortness of breath, no fever or chills.  She complains of pain in neck, throat, right upper chest and right shoulder area.  Per husband, patient has not been able to eat in the past several days.  She does not have nausea, vomiting, diarrhea or abdominal pain.  No symptoms of UTI.  ED Course: pt was found to have WBC 4.1, INR 1.1, ammonia level 17, negative COVID PCR, alcohol level less than 10, abnormal liver function (ALP 50, AST 77, ALT 26, total bilirubin 1.3), electrolytes renal function okay, magnesium 2.1, temperature normal, blood pressure 157/88, heart rate 82, RR 18, oxygen saturation 98% on room air.  Patient is placed on MedSurg bed for observation, Dr. Tasia Catchings of oncology is consulted.   Review of Systems:   General: no fevers, chills, no body weight gain, has poor appetite, has fatigue HEENT: no blurry vision, hearing changes or sore throat Respiratory: has dyspnea, coughing, no  wheezing.  Has hemoptysis. CV: has chest pain, no palpitations GI: no nausea, vomiting, abdominal pain, diarrhea, constipation GU: no dysuria, burning on urination, increased urinary frequency, hematuria  Ext: no leg edema Neuro: no unilateral weakness, numbness, or tingling, no vision change or hearing loss Skin: no rash, no skin tear. MSK: No muscle spasm, no deformity, no limitation of range of movement in spin Heme: No easy bruising.  Travel history: No recent long distant travel.  Allergy:  Allergies  Allergen Reactions   Penicillins Rash and Other (See Comments)    Has patient had a PCN reaction causing immediate rash, facial/tongue/throat swelling, SOB or lightheadedness with hypotension: No Has patient had a PCN reaction causing severe rash involving mucus membranes or skin necrosis: No Has patient had a PCN reaction that required hospitalization No Has patient had a PCN reaction occurring within the last 10 years: No If all of the above answers are "NO", then may proceed with Cephalosporin use.    Past Medical History:  Diagnosis Date   Alcohol abuse    Asthma    Cancer ([place])    "carcinoma of throat" with s/p thyroidectomy   Chronic pain syndrome    Chronic pancreatitis (HCC)    COPD (chronic obstructive pulmonary disease) ([place])    Depression    Hepatitis C    History of alcohol abuse    History of drug use    Hypertension 03/18/2013   Hypothyroidism    Low back pain    Pancreatic pseudocyst 03/18/2015   Seizures ([place])    Stricture  of duodenum 05/16/2011    Past Surgical History:  Procedure Laterality Date   CESAREAN SECTION     GASTROSTOMY TUBE PLACEMENT  2011   TOTAL THYROIDECTOMY      Social History:  reports that she has been smoking cigarettes. She has been smoking an average of .25 packs per day. She has never used smokeless tobacco. She reports current alcohol use. She reports current drug use. Drugs: Marijuana and Cocaine.  Family History:  Family  History  Problem Relation Age of Onset   Diabetes Mellitus II Maternal Grandfather    CAD Maternal Grandfather    Diabetes Mellitus II Mother    Liver cancer Mother    Arthritis Mother    Cancer Mother        lung   Cancer Father    Thyroid disease Son    Diabetes Mellitus II Maternal Aunt    Pancreatic cancer Paternal Aunt      Prior to Admission medications   Medication Sig Start Date End Date Taking? Authorizing Provider  albuterol (VENTOLIN HFA) 108 (90 Base) MCG/ACT inhaler Inhale 1-2 puffs into the lungs every 4 (four) hours as needed for wheezing or shortness of breath. Patient not taking: No sig reported 08/16/20   Park Liter P, DO  levothyroxine (SYNTHROID) 125 MCG tablet Take 1 tablet (125 mcg total) by mouth daily. Patient not taking: No sig reported 07/01/21   Marnee Guarneri T, NP  QUEtiapine (SEROQUEL XR) 50 MG TB24 24 hr tablet Take 1 tablet (50 mg total) by mouth at bedtime. Patient not taking: No sig reported 12/20/19   Kathrine Haddock, NP    Physical Exam: Vitals:   07/31/21 2203 07/31/21 2304 07/31/21 2318 08/01/21 0411  BP:  123/85  126/90  Pulse:  (!) 110 66 79  Resp:  16  19  Temp: 98.8 F (37.1 C) 98.4 F (36.9 C)  98.5 F (36.9 C)  TempSrc: Oral Oral  Oral  SpO2:  (!) 70% 100% 100%  Weight:      Height:       General: Not in acute distress.  Thin body habitus. HEENT: has a small biopsy marker in the right side of the neck.       Eyes: PERRL, EOMI, no scleral icterus.       ENT: No discharge from the ears and nose, no pharynx injection, no tonsillar enlargement.        Neck: No JVD, no bruit, no mass felt. Heme: No neck lymph node enlargement. Cardiac: S1/S2, RRR, No murmurs, No gallops or rubs. Respiratory: No rales, wheezing, rhonchi or rubs. GI: Soft, nondistended, nontender, no rebound pain, no organomegaly, BS present. GU: No hematuria Ext: No pitting leg edema bilaterally. 1+DP/PT pulse bilaterally. Musculoskeletal: No joint deformities,  No joint redness or warmth, no limitation of ROM in spin. Skin: No rashes.  Neuro: Drowsy, arousable, still oriented X3, cranial nerves II-XII grossly intact, moves all extremities normally.  Psych: Patient is not psychotic, no suicidal or hemocidal ideation.  Labs on Admission: I have personally reviewed following labs and imaging studies  CBC: Recent Labs  Lab 07/27/21 0144 07/29/21 0629 07/30/21 0451 07/31/21 0542 07/31/21 1435 07/31/21 2327 08/01/21 0519  WBC 7.7   < > 4.5 4.1 4.5 4.9 4.4  NEUTROABS 6.7  --   --  2.8  --   --   --   HGB 11.9*   < > 9.4* 11.0* 10.2* 8.8* 9.1*  HCT 36.3   < > 29.0* 33.2*  31.6* 27.8* 28.9*  MCV 104.3*   < > 103.9* 104.1* 105.0* 105.7* 106.6*  PLT 407*   < > 292 338 330 309 285   < > = values in this interval not displayed.   Basic Metabolic Panel: Recent Labs  Lab 07/27/21 0144 07/28/21 0144 07/29/21 0629 07/30/21 0451 07/31/21 0542 08/01/21 0514  NA 136  --  135 136 134* 136  K 2.9*  --  3.6 3.9 3.8 3.2*  CL 102  --  107 110 105 105  CO2 24  --  22 23 23 22   GLUCOSE 67*  --  151* 139* 86 100*  BUN 18  --  13 7 <5* <5*  CREATININE 1.00  --  0.81 0.66 0.81 0.95  CALCIUM 9.3  --  8.4* 8.2* 8.9 7.9*  MG  --  2.6*  --   --  2.1  --    GFR: Estimated Creatinine Clearance: 41.8 mL/min (by C-G formula based on SCr of 0.95 mg/dL). Liver Function Tests: Recent Labs  Lab 07/27/21 0144 07/30/21 0451 07/31/21 0542  AST 151* 71* 77*  ALT 41 26 26  ALKPHOS 53 42 50  BILITOT 1.4* 0.9 1.3*  PROT 10.0* 7.6 8.5*  ALBUMIN 4.8 3.5 3.9   Recent Labs  Lab 07/27/21 0144  LIPASE 26   Recent Labs  Lab 07/31/21 0617  AMMONIA 17   Coagulation Profile: Recent Labs  Lab 07/29/21 0613 07/31/21 0542  INR 1.1 1.1   Cardiac Enzymes: No results for input(s): CKTOTAL, CKMB, CKMBINDEX, TROPONINI in the last 168 hours. BNP (last 3 results) No results for input(s): PROBNP in the last 8760 hours. HbA1C: No results for input(s): HGBA1C in  the last 72 hours. CBG: Recent Labs  Lab 07/31/21 0716 07/31/21 2200 07/31/21 2350 08/01/21 0028 08/01/21 0500  GLUCAP 76 70 63* 85 95   Lipid Profile: No results for input(s): CHOL, HDL, LDLCALC, TRIG, CHOLHDL, LDLDIRECT in the last 72 hours. Thyroid Function Tests: Recent Labs    07/30/21 0451  TSH 287.950*  FREET4 <0.25*   Anemia Panel: Recent Labs    07/30/21 0451  VITAMINB12 891  FOLATE 13.9  RETICCTPCT 1.5   Urine analysis:    Component Value Date/Time   COLORURINE STRAW (A) 12/07/2015 0020   APPEARANCEUR CLEAR (A) 12/07/2015 0020   APPEARANCEUR Clear 12/27/2013 0847   LABSPEC 1.005 12/07/2015 0020   LABSPEC 1.006 12/27/2013 0847   PHURINE 7.0 12/07/2015 0020   GLUCOSEU NEGATIVE 12/07/2015 0020   GLUCOSEU Negative 12/27/2013 0847   HGBUR NEGATIVE 12/07/2015 0020   BILIRUBINUR NEGATIVE 12/07/2015 0020   BILIRUBINUR Negative 12/27/2013 0847   KETONESUR NEGATIVE 12/07/2015 0020   PROTEINUR NEGATIVE 12/07/2015 0020   NITRITE NEGATIVE 12/07/2015 0020   LEUKOCYTESUR NEGATIVE 12/07/2015 0020   LEUKOCYTESUR Negative 12/27/2013 0847   Sepsis Labs: @LABRCNTIP (procalcitonin:4,lacticidven:4) ) Recent Results (from the past 240 hour(s))  Resp Panel by RT-PCR (Flu A&B, Covid) Nasopharyngeal Swab     Status: None   Collection Time: 07/28/21 10:07 AM   Specimen: Nasopharyngeal Swab; Nasopharyngeal(NP) swabs in vial transport medium  Result Value Ref Range Status   SARS Coronavirus 2 by RT PCR NEGATIVE NEGATIVE Final    Comment: (NOTE) SARS-CoV-2 target nucleic acids are NOT DETECTED.  The SARS-CoV-2 RNA is generally detectable in upper respiratory specimens during the acute phase of infection. The lowest concentration of SARS-CoV-2 viral copies this assay can detect is 138 copies/mL. A negative result does not preclude SARS-Cov-2 infection and should not be  used as the sole basis for treatment or other patient management decisions. A negative result may occur  with  improper specimen collection/handling, submission of specimen other than nasopharyngeal swab, presence of viral mutation(s) within the areas targeted by this assay, and inadequate number of viral copies(<138 copies/mL). A negative result must be combined with clinical observations, patient history, and epidemiological information. The expected result is Negative.  Fact Sheet for Patients:  EntrepreneurPulse.com.au  Fact Sheet for Healthcare Providers:  IncredibleEmployment.be  This test is no t yet approved or cleared by the [place] FDA and  has been authorized for detection and/or diagnosis of SARS-CoV-2 by FDA under an Emergency Use Authorization (EUA). This EUA will remain  in effect (meaning this test can be used) for the duration of the COVID-19 declaration under Section 564(b)(1) of the Act, 21 U.S.C.section 360bbb-3(b)(1), unless the authorization is terminated  or revoked sooner.       Influenza A by PCR NEGATIVE NEGATIVE Final   Influenza B by PCR NEGATIVE NEGATIVE Final    Comment: (NOTE) The Xpert Xpress SARS-CoV-2/FLU/RSV plus assay is intended as an aid in the diagnosis of influenza from Nasopharyngeal swab specimens and should not be used as a sole basis for treatment. Nasal washings and aspirates are unacceptable for Xpert Xpress SARS-CoV-2/FLU/RSV testing.  Fact Sheet for Patients: EntrepreneurPulse.com.au  Fact Sheet for Healthcare Providers: IncredibleEmployment.be  This test is not yet approved or cleared by the [place] FDA and has been authorized for detection and/or diagnosis of SARS-CoV-2 by FDA under an Emergency Use Authorization (EUA). This EUA will remain in effect (meaning this test can be used) for the duration of the COVID-19 declaration under Section 564(b)(1) of the Act, 21 U.S.C. section 360bbb-3(b)(1), unless the authorization is terminated  or revoked.  Performed at [facility], [place], [place] [address]   Expectorated Sputum Assessment w Gram Stain, Rflx to Resp Cult     Status: None   Collection Time: 07/31/21  4:15 AM   Specimen: Expectorated Sputum  Result Value Ref Range Status   Specimen Description EXPECTORATED SPUTUM  Final   Special Requests NONE  Final   Sputum evaluation   Final    Sputum specimen not acceptable for testing.  Please recollect.   Lonn Georgia FREEMIRE  08/01/21 @ 0545 BY SB Performed at [facility], [address] [place], [place] [address]    Report Status 08/01/2021 FINAL  Final  Resp Panel by RT-PCR (Flu A&B, Covid) Nasopharyngeal Swab     Status: None   Collection Time: 07/31/21  5:42 AM   Specimen: Nasopharyngeal Swab; Nasopharyngeal(NP) swabs in vial transport medium  Result Value Ref Range Status   SARS Coronavirus 2 by RT PCR NEGATIVE NEGATIVE Final    Comment: (NOTE) SARS-CoV-2 target nucleic acids are NOT DETECTED.  The SARS-CoV-2 RNA is generally detectable in upper respiratory specimens during the acute phase of infection. The lowest concentration of SARS-CoV-2 viral copies this assay can detect is 138 copies/mL. A negative result does not preclude SARS-Cov-2 infection and should not be used as the sole basis for treatment or other patient management decisions. A negative result may occur with  improper specimen collection/handling, submission of specimen other than nasopharyngeal swab, presence of viral mutation(s) within the areas targeted by this assay, and inadequate number of viral copies(<138 copies/mL). A negative result must be combined with clinical observations, patient history, and epidemiological information. The expected result is Negative.  Fact Sheet for Patients:  EntrepreneurPulse.com.au  Fact Sheet for Healthcare Providers:  IncredibleEmployment.be  This test is no t yet approved or  cleared by the [place] FDA and  has been authorized for detection and/or diagnosis of SARS-CoV-2 by FDA under an Emergency Use Authorization (EUA). This EUA will remain  in effect (meaning this test can be used) for the duration of the COVID-19 declaration under Section 564(b)(1) of the Act, 21 U.S.C.section 360bbb-3(b)(1), unless the authorization is terminated  or revoked sooner.       Influenza A by PCR NEGATIVE NEGATIVE Final   Influenza B by PCR NEGATIVE NEGATIVE Final    Comment: (NOTE) The Xpert Xpress SARS-CoV-2/FLU/RSV plus assay is intended as an aid in the diagnosis of influenza from Nasopharyngeal swab specimens and should not be used as a sole basis for treatment. Nasal washings and aspirates are unacceptable for Xpert Xpress SARS-CoV-2/FLU/RSV testing.  Fact Sheet for Patients: EntrepreneurPulse.com.au  Fact Sheet for Healthcare Providers: IncredibleEmployment.be  This test is not yet approved or cleared by the [place] FDA and has been authorized for detection and/or diagnosis of SARS-CoV-2 by FDA under an Emergency Use Authorization (EUA). This EUA will remain in effect (meaning this test can be used) for the duration of the COVID-19 declaration under Section 564(b)(1) of the Act, 21 U.S.C. section 360bbb-3(b)(1), unless the authorization is terminated or revoked.  Performed at [facility], [address], [place], [place] [address]      Radiological Exams on Admission: DG Chest Portable 1 View  Result Date: 07/31/2021 CLINICAL DATA:  55 year old female with cancer at the thoracic inlet, coughing and vomiting blood. Aspiration. EXAM: PORTABLE CHEST 1 VIEW COMPARISON:  Chest CTA 07/28/2021 and earlier. FINDINGS: Portable AP upright view at 0540 hours. Lung volumes remain normal. Heart size and central mediastinal contours remain within normal limits. Vague increased right paratracheal density corresponds  to indistinct right thoracic inlet soft tissue mass seen by CT. Mild leftward deviation of the trachea but no significant tracheal narrowing. Patchy, spiculated mid right lung opacity as seen recently by CT. But otherwise allowing for portable technique the lungs are clear. No acute osseous abnormality identified. Negative visible bowel gas. IMPRESSION: 1. Patchy right mid lung opacity stable from the recent CT. 2. Vague increased right paratracheal density corresponding to large right thoracic inlet mass. 3. No new cardiopulmonary abnormality. Electronically Signed   By: Genevie Ann M.D.   On: 07/31/2021 05:52     EKG: I have personally reviewed.  Sinus rhythm, QTC 536, low voltage, T wave inversion in V1-V3   Assessment/Plan Principal Problem:   Hemoptysis Active Problems:   Chronic pancreatitis (HCC)   Hypothyroidism   Hypertension   Nicotine dependence   Bipolar 2 disorder (HCC)   Aspiration pneumonia (HCC)   Transaminitis   Dysphagia   Alcohol abuse   Squamous cell carcinoma of esophagus (HCC)   GERD (gastroesophageal reflux disease)   Macrocytic anemia   Protein-calorie malnutrition, moderate (HCC)   Acute metabolic encephalopathy   Hemoptysis vs. hematemesis: Patient reports coughing with dark red bloody material coming out, not sure if this is true hemoptysis or hematemesis secondary to esophageal cancer.  Hemoglobin stable.  -will admit to med-surg bed as inpt -check CBC q6h -type and screen -started Protonix 40 mg twice daily empirically  Dysphagia due to squamous cell carcinoma of esophagus: CEA 8.1 -G-tube placement -sent message to Dr. Denna Haggard of IR  -consulted Dr. Tasia Catchings of oncology -start D5-NS at 100 cc/h now -palliative consult  Addendum: Initially consulted  IR for G-tube placement, unfortunately given large obstructive mass in esophagus, and concerns for IV and aspiration, IR PA, Tsosie Billing discussed with Dr. Kathlene Cote, they recommended to consult general surgeon  for G-tube placement. -Consulted Dr. Christian Mate  and PA Edison Simon  Aspiration pneumonia: No fever or leukocytosis. -Rocephin and Flagyl -Bronchodilators  Hypothyroidism:  pt is supposed to take Synthroid at 150 mcg daily, but did not take it this medication for several months.  TSH elevated to 287 on 07/30/2021 with free T4 < 0.25.  Patient's temperature normal, heart rate 82, oriented x 3, does not seem to have myxedema. -Start Synthroid 100 mcg by IV daily -need to recheck TSH in 4-6 weeks.  Chronic pancreatitis ([place]): -No acute issues.  Hypertension: not taking medications currently.  Blood pressure 157/88 -IV hydralazine as needed  Nicotine dependence and alcohol abuse -Nicotine patch -CiWA protocol  Transaminitis: Likely due to history of hepatitis C and alcohol abuse -Avoid using Tylenol -Monitoring  GERD (gastroesophageal reflux disease) -on IV protonix   Macrocytic anemia: Hgb stable. 9.4 on 07/30/21 to  -f/u with cbc q6h  Protein-calorie malnutrition, moderate ([place]): BMI 16.6 -Will start tube feeding after G-tube is placed.  Bipolar disorder: pt seems to have problems coping with the diagnosis. -Consulted psychiatry, [place]  Acute metabolic encephalopathy: Patient is still drowsy, but arousable, oriented x3 when aroused.  Etiology is not clear.  Likely multifactorial etiology.  Ammonia level normal, 17. -Frequent neuro check -Follow-up CT of head    DVT ppx: SCD Code Status: Full code per pt  Family Communication:   Yes, patient's husband   at bed side Disposition Plan:  Anticipate discharge back to previous environment Consults called:  Dr. Tasia Catchings of oncology Admission status and Level of care: Med-Surg:   as inpt   Status is: Inpatient  Remains inpatient appropriate because: Patient has multiple comorbidities, now presents with newly diagnosed esophageal cancer, has dysphagia, cannot eat food. Need NG tube placement, also has aspiration pneumonia and  protein-calorie malnutrition-moderate.  Patient's presentation is highly complicated.  Patient is at high risk of deterioration.  Will need to be treated in hospital for at least 2 days                   Date of Service 08/01/2021    [facility]   If 7PM-7AM, please contact night-coverage [email or web address] 08/01/2021, 6:59 AM

## 2021-07-31 NOTE — ED Notes (Signed)
Patient asking for pain medications and "something other than Benadryl for my nerves."  Patient informed that no PRN medication can be given at this time.  Will continue to monitor patient.

## 2021-07-31 NOTE — ED Notes (Signed)
Pt resting comfortably at this time. MD requests a CBG, result was 74. Pt was given ativan per CIWA and pt is sleepy at this time. Husband at bedside. Call bell in reach.

## 2021-07-31 NOTE — Anesthesia Preprocedure Evaluation (Deleted)
Anesthesia Evaluation  Patient identified by MRN, date of birth, ID band Patient awake    Reviewed: Allergy & Precautions, NPO status , Patient's Chart, lab work & pertinent test results  Airway        Dental   Pulmonary asthma , pneumonia, resolved, COPD,  COPD inhaler, Current SmokerPatient did not abstain from smoking.,           Cardiovascular hypertension, negative cardio ROS       Neuro/Psych Seizures -,  PSYCHIATRIC DISORDERS Anxiety Depression Bipolar Disorder    GI/Hepatic GERD  ,(+) Hepatitis -, C  Endo/Other  Hypothyroidism   Renal/GU negative Renal ROS  negative genitourinary   Musculoskeletal negative musculoskeletal ROS (+)   Abdominal   Peds negative pediatric ROS (+)  Hematology negative hematology ROS (+) anemia ,   Anesthesia Other Findings . Alcohol abuse  . Asthma  . Cancer ([place])   "carcinoma of throat" with s/p thyroidectomy . Chronic pain syndrome  . Chronic pancreatitis ([place])  . COPD (chronic obstructive pulmonary disease) ([place])  . Depression  . Hepatitis C  . History of alcohol abuse  . History of drug use  . Hypertension 03/18/2013 . Hypothyroidism  . Low back pain  . Pancreatic pseudocyst 03/18/2015 . Seizures ([place])  . 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    Reproductive/Obstetrics negative OB ROS                             Anesthesia Physical Anesthesia Plan Anesthesia Quick Evaluation

## 2021-07-31 NOTE — Consult Note (Signed)
Pharmacy Antibiotic Note  [name] is a 55 y.o. female admitted on 07/31/2021 with  Aspiration pneumonia .  Pharmacy has been consulted for Unasyn dosing.  Plan: Patient is allergic to penicillins. Provider (Dr Blaine Hamper) contacted. Okay to d/c consult for Unasyn and dose Ceftriaxone plus metronidazole. Noted history of cancer.  Ceftriaxone 2gm IVPB q 24 hrs x 7 days Metronidazole 500mg  IVOB q 8hr x 7 days  Height: 5\' 1"  (154.9 cm) Weight: 40 kg (88 lb 2.9 oz) IBW/kg (Calculated) : 47.8  Temp (24hrs), Avg:97.9 F (36.6 C), Min:97.9 F (36.6 C), Max:97.9 F (36.6 C)  Recent Labs  Lab 07/27/21 0144 07/29/21 0629 07/30/21 0451 07/31/21 0542  WBC 7.7 5.8 4.5 4.1  CREATININE 1.00 0.81 0.66 0.81    Estimated Creatinine Clearance: 49 mL/min (by C-G formula based on SCr of 0.81 mg/dL).    Allergies  Allergen Reactions   Penicillins Rash and Other (See Comments)    Has patient had a PCN reaction causing immediate rash, facial/tongue/throat swelling, SOB or lightheadedness with hypotension: No Has patient had a PCN reaction causing severe rash involving mucus membranes or skin necrosis: No Has patient had a PCN reaction that required hospitalization No Has patient had a PCN reaction occurring within the last 10 years: No If all of the above answers are "NO", then may proceed with Cephalosporin use.    Antimicrobials this admission: 11/16 Ceftriaxone >>  11/16 Metronidazole >>   Dose adjustments this admission: none  Thank you for allowing pharmacy to be a part of this patient's care.  Jese Comella Rodriguez-Guzman PharmD, BCPS 07/31/2021 10:40 AM

## 2021-07-31 NOTE — ED Triage Notes (Signed)
Hx of throat ca. Called ems for coughing/vomiting up blood. Was admitted 11/13 for same complaint and left AMA.

## 2021-07-31 NOTE — ED Notes (Signed)
Report given to Rodman Key, RN. Pt being moved to C-pod.

## 2021-07-31 NOTE — Consult Note (Signed)
[facility] SURGICAL CONSULTATION NOTE (initial) - cpt: 99254   HISTORY OF PRESENT ILLNESS (HPI):  55 y.o. female presented to [facility] ED today for evaluation of hemoptysis. Patient initially presented to the ED on 11/13 with complaints of gradually worsening difficulty swallowing, intolerance of PO intake, and weight loss. Work up at that time was concerning for 6.7 cm mass in the esophageus and thoracic outlet as well as possible aspiration pneumonia. She was admitted to the medicine service at that time. 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 Unfortunately, she left AMA on 11/15 secondary to  scared. She represents again today for management.   Surgery is consulted by hospitalist physician Dr. Ivor Costa, MD in this context for evaluation for gastrostomy tube placement in setting of obstructing esophageal cancer.    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 Authorizing Provider  albuterol (VENTOLIN HFA) 108 (90 Base) MCG/ACT inhaler Inhale 1-2 puffs into the lungs every 4 (four) hours as needed for wheezing or shortness of breath. Patient not taking: No sig reported 08/16/20   Park Liter P, DO   levothyroxine (SYNTHROID) 125 MCG tablet Take 1 tablet (125 mcg total) by mouth daily. Patient not taking: No sig reported 07/01/21   Marnee Guarneri T, NP  QUEtiapine (SEROQUEL XR) 50 MG TB24 24 hr tablet Take 1 tablet (50 mg total) by mouth at bedtime. Patient not taking: No sig reported 12/20/19   Kathrine Haddock, NP     ALLERGIES:  Allergies  Allergen Reactions   Penicillins Rash and Other (See Comments)    Has patient had a PCN reaction causing immediate rash, facial/tongue/throat swelling, SOB or lightheadedness with hypotension: No Has patient had a PCN reaction causing severe rash involving mucus membranes or skin necrosis: No Has patient had a PCN reaction that required hospitalization No Has patient had a PCN reaction occurring within the last 10 years: No If all of the above answers are "NO", then may proceed with Cephalosporin use.     SOCIAL HISTORY:  Social History   Socioeconomic History   Marital status: Single    Spouse name: Not on file   Number of children: Not on file   Years of education: Not on file   Highest education level: Not on file  Occupational History   Not on file  Tobacco Use   Smoking status: Every Day    Packs/day: 0.25    Types: Cigarettes   Smokeless tobacco: Never  Vaping Use   Vaping Use: Never used  Substance and Sexual Activity   Alcohol  use: Yes    Alcohol/week: 0.0 standard drinks    Comment: pt states she has a drink every day   Drug use: Yes    Types: Marijuana, Cocaine    Comment: History of drug use   Sexual activity: Not on file  Other Topics Concern   Not on file  Social History Narrative   Not on file   Social Determinants of Health   Financial Resource Strain: High Risk   Difficulty of Paying Living Expenses: Very hard  Food Insecurity: No Food Insecurity   Worried About Charity fundraiser in the Last Year: Never true   Ran Out of Food in the Last Year: Never true  Transportation Needs: No Transportation Needs    Lack of Transportation (Medical): No   Lack of Transportation (Non-Medical): No  Physical Activity: Inactive   Days of Exercise per Week: 0 days   Minutes of Exercise per Session: 0 min  Stress: Stress Concern Present   Feeling of Stress : Very much  Social Connections: Socially Isolated   Frequency of Communication with Friends and Family: Never   Frequency of Social Gatherings with Friends and Family: Never   Attends Religious Services: Never   Marine scientist or Organizations: No   Attends Music therapist: Never   Marital Status: Married  Human resources officer Violence: Not At Risk   Fear of Current or Ex-Partner: No   Emotionally Abused: No   Physically Abused: No   Sexually Abused: No     FAMILY HISTORY:  Family History  Problem Relation Age of Onset   Diabetes Mellitus II Maternal Grandfather    CAD Maternal Grandfather    Diabetes Mellitus II Mother    Liver cancer Mother    Arthritis Mother    Cancer Mother        lung   Cancer Father    Thyroid disease Son    Diabetes Mellitus II Maternal Aunt    Pancreatic cancer Paternal Aunt       REVIEW OF SYSTEMS:  Review of Systems  Constitutional:  Positive for malaise/fatigue and weight loss. Negative for chills and fever.  HENT:         + trouble swallowing   Respiratory:  Positive for cough and hemoptysis.   Gastrointestinal:  Negative for abdominal pain, nausea and vomiting.  All other systems reviewed and are negative.  VITAL SIGNS:  Temp:  [97.9 F (36.6 C)] 97.9 F (36.6 C) (11/16 0530) Pulse Rate:  [74-78] 78 (11/16 1000) Resp:  [17-18] 17 (11/16 1000) BP: (115-157)/(88) 115/88 (11/16 1000) SpO2:  [96 %-98 %] 96 % (11/16 1000) Weight:  [40 kg] 40 kg (11/16 0532)     Height: 5\' 1"  (154.9 cm) Weight: 40 kg BMI (Calculated): 16.67   INTAKE/OUTPUT:  No intake/output data recorded.  PHYSICAL EXAM:  Physical Exam Vitals and nursing note reviewed. Exam conducted with a chaperone present.   Constitutional:      Appearance: She is cachectic.     Comments: Patient frail and cachetic in appearance, somnolent but arouses, able to participate. Significant other at bedside helps.   HENT:     Head: Normocephalic and atraumatic.     Mouth/Throat:     Comments: Poor dentition, very audible upper airway secretions  Eyes:     General: No scleral icterus.    Conjunctiva/sclera: Conjunctivae normal.  Pulmonary:     Effort: Pulmonary effort is normal. No respiratory distress.  Abdominal:  General: There is no distension.     Palpations: Abdomen is soft.     Tenderness: There is no abdominal tenderness. There is no guarding or rebound.  Genitourinary:    Comments: Deferred Musculoskeletal:     Right lower leg: No edema.     Left lower leg: No edema.  Skin:    General: Skin is warm and dry.     Coloration: Skin is not pale.     Findings: No erythema.  Neurological:     Mental Status: She is alert.     Comments: Unable to reliably assess secondary to somnolence   Psychiatric:     Comments: Unable to reliably assess secondary to somnolence      Labs:  CBC Latest Ref Rng & Units 07/31/2021 07/30/2021 07/29/2021  WBC 4.0 - 10.5 K/uL 4.1 4.5 5.8  Hemoglobin 12.0 - 15.0 g/dL 11.0(L) 9.4(L) 9.5(L)  Hematocrit 36.0 - 46.0 % 33.2(L) 29.0(L) 29.5(L)  Platelets 150 - 400 K/uL 338 292 331   CMP Latest Ref Rng & Units 07/31/2021 07/30/2021 07/29/2021  Glucose 70 - 99 mg/dL 86 139(H) 151(H)  BUN 6 - 20 mg/dL <5(L) 7 13  Creatinine 0.44 - 1.00 mg/dL 0.81 0.66 0.81  Sodium 135 - 145 mmol/L 134(L) 136 135  Potassium 3.5 - 5.1 mmol/L 3.8 3.9 3.6  Chloride 98 - 111 mmol/L 105 110 107  CO2 22 - 32 mmol/L 23 23 22   Calcium 8.9 - 10.3 mg/dL 8.9 8.2(L) 8.4(L)  Total Protein 6.5 - 8.1 g/dL 8.5(H) 7.6 -  Total Bilirubin 0.3 - 1.2 mg/dL 1.3(H) 0.9 -  Alkaline Phos 38 - 126 U/L 50 42 -  AST 15 - 41 U/L 77(H) 71(H) -  ALT 0 - 44 U/L 26 26 -    Imaging studies:   CT Soft Tissue Neck  (August 05, 2021) personally reviewed showing large obstructing mass in the esophagus and thoracic outlet, and radiologist report reviewed below:  IMPRESSION: 1. Nearly 7 cm mass at the thoracic inlet, favor locally advanced esophageal cancer with encasement of major vessels. The mass approaches the skin surface and may be amenable to percutaneous biopsy. 2. Single mildly enlarged left level 4 lymph node.    Assessment/Plan: (ICD-10's: K76.89) 55 y.o. 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   - NPO for planned procedure - IVF Resuscitation - She is on IV Abx for aspiration pneumonia   - Typically okay to use gastrostomy tube 24-48 hours after placement   - DVT prophylaxis; hold for OR  - Further management per primary service  All of the above findings and recommendations were discussed with the patient and her family (husband at bedside), and all of their questions were answered to their expressed satisfaction.  Thank you for the opportunity to participate in this patient's care.   -- Edison Simon, PA-C [facility] 07/31/2021, 11:38 AM [phone] M-F: 7am - 4pm

## 2021-07-31 NOTE — ED Provider Notes (Signed)
[facility] Emergency Department Provider Note  ____________________________________________   Event Date/Time   First MD Initiated Contact with Patient 07/31/21 [phone]     (approximate)  I have reviewed the triage vital signs and the nursing notes.   HISTORY  Chief Complaint Hematemesis (Coughing/vomiting blood)    HPI [name] is a 55 y.o. female with history of hepatitis C, COPD, hypertension, alcohol abuse who presents to the emergency department with complaints of diffuse throat pain and coughing up blood.  States this has been ongoing for months and tonight she started choking on the blood that she was coughing a lot.  EMS reports that she had a trash can with approximately 100 mL of dark red blood in it.  She denies that she is vomiting any blood.  No chest pain, shortness of breath, abdominal pain.  Was just seen in the emergency department for this on 07/28/2021 and had CT scans of her neck, chest, abdomen and pelvis.  Imaging showed a nearly 7 cm mass at the thoracic inlet likely advanced esophageal cancer with encasement of major vessels.  CT is also concerning for possible aspiration pneumonia.  Patient was started on antibiotics and admitted to the hospital.  Patient underwent biopsy by IR on 07/29/2021 that showed squamous cell carcinoma.  Patient left the hospital [place] yesterday because she states "I was scared".  She now states she is back for treatment and agrees to stay.        Past Medical History:  Diagnosis Date   Alcohol abuse    Asthma    Cancer ([place])    "carcinoma of throat" with s/p thyroidectomy   Chronic pain syndrome    Chronic pancreatitis ([place])    COPD (chronic obstructive pulmonary disease) ([place])    Depression    Hepatitis C    History of alcohol abuse    History of drug use    Hypertension 03/18/2013   Hypothyroidism    Low back pain    Pancreatic pseudocyst 03/18/2015   Seizures ([place])    Stricture of  duodenum 05/16/2011    Patient Active Problem List   Diagnosis Date Noted   Aspiration pneumonia ([place]) 07/28/2021   Hypoglycemia 07/28/2021   Transaminitis 07/28/2021   Dysphagia 07/28/2021   Neck mass 07/28/2021   Hypomagnesemia 07/28/2021   Hypokalemia 07/28/2021   Bipolar 2 disorder ([place]) 07/27/2018   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   Chronic pain syndrome 07/20/2015   COPD (chronic obstructive pulmonary disease) ([place]) 07/20/2015   Hepatitis C 07/20/2015   Alcohol use disorder, severe, dependence ([place]) 03/22/2015   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 Authorizing Provider  albuterol (VENTOLIN HFA) 108 (90 Base) MCG/ACT inhaler Inhale 1-2 puffs into the lungs every 4 (four) hours as needed for wheezing or shortness of breath. Patient not taking: No sig reported 08/16/20   Park Liter P, DO  levothyroxine (SYNTHROID) 125 MCG tablet Take 1 tablet (125 mcg total) by mouth daily. Patient not taking: No sig reported 07/01/21  Cannady, Jolene T, NP  QUEtiapine (SEROQUEL XR) 50 MG TB24 24 hr tablet Take 1 tablet (50 mg total) by mouth at bedtime. Patient not taking: No sig reported 12/20/19   Kathrine Haddock, NP    Allergies Penicillins  Family History  Problem Relation Age of Onset   Diabetes Mellitus II Maternal Grandfather    CAD Maternal Grandfather    Diabetes Mellitus II Mother    Liver cancer Mother    Arthritis Mother    Cancer Mother        lung   Cancer Father    Thyroid disease Son    Diabetes  Mellitus II Maternal Aunt    Pancreatic cancer Paternal Aunt     Social History Social History   Tobacco Use   Smoking status: Every Day    Packs/day: 0.25    Types: Cigarettes   Smokeless tobacco: Never  Vaping Use   Vaping Use: Never used  Substance Use Topics   Alcohol use: Yes    Alcohol/week: 0.0 standard drinks    Comment: pt states she has a drink every day   Drug use: Yes    Types: Marijuana, Cocaine    Comment: History of drug use    Review of Systems Constitutional: No fever. Eyes: No visual changes. ENT: + sore throat. Cardiovascular: Denies chest pain. Respiratory: Denies shortness of breath. Gastrointestinal: No nausea, vomiting, diarrhea. Genitourinary: Negative for dysuria. Musculoskeletal: Negative for back pain. Skin: Negative for rash. Neurological: Negative for focal weakness or numbness.  ____________________________________________   PHYSICAL EXAM:  VITAL SIGNS: ED Triage Vitals  Enc Vitals Group     BP 07/31/21 0530 (!) 157/88     Pulse Rate 07/31/21 0530 74     Resp 07/31/21 0530 18     Temp 07/31/21 0530 97.9 F (36.6 C)     Temp Source 07/31/21 0530 Oral     SpO2 07/31/21 0530 98 %     Weight 07/31/21 0532 88 lb 2.9 oz (40 kg)     Height 07/31/21 0532 5\' 1"  (1.549 m)     Head Circumference --      Peak Flow --      Pain Score 07/31/21 0531 10     Pain Loc --      Pain Edu? --      Excl. in [place]? --    CONSTITUTIONAL: Alert and oriented and responds appropriately to questions.  Appears much older than stated age.  Thin.  Appears very drowsy, intoxicated but is arousable to voice and will answer questions. HEAD: Normocephalic EYES: Conjunctivae clear, pupils appear equal, EOM appear intact ENT: normal nose; severely dry mucous membranes.  She does have some trismus and is not able to open her mouth fully.  I am unable to depress her tongue enough to see her posterior oropharynx using a tongue depressor.  I am able to see a small  amount of dark red blood in the back of her throat.  Her phonation is normal.  She has no stridor or drooling.  She is handling her secretions. NECK: Supple, normal ROM, diffusely tender throughout the anterior and lateral neck, trachea is midline CARD: RRR; S1 and S2 appreciated; no murmurs, no clicks, no rubs, no gallops RESP: Normal chest excursion without splinting or tachypnea; breath sounds clear and equal bilaterally; no wheezes, no rhonchi, no rales, no hypoxia or respiratory distress, speaking full sentences ABD/GI: Normal bowel sounds; non-distended; soft, non-tender, no rebound, no guarding, no peritoneal signs, no  hepatosplenomegaly BACK: The back appears normal EXT: Normal ROM in all joints; no deformity noted, no edema; no cyanosis SKIN: Normal color for age and race; warm; no rash on exposed skin NEURO: Moves all extremities equally PSYCH: The patient's mood and manner are appropriate.  ____________________________________________   LABS (all labs ordered are listed, but only abnormal results are displayed)  Labs Reviewed  CBC WITH DIFFERENTIAL/PLATELET - Abnormal; Notable for the following components:      Result Value   RBC 3.19 (*)    Hemoglobin 11.0 (*)    HCT 33.2 (*)    MCV 104.1 (*)    MCH 34.5 (*)    All other components within normal limits  RESP PANEL BY RT-PCR (FLU A&B, COVID) ARPGX2  PROTIME-INR  AMMONIA  URINE DRUG SCREEN, QUALITATIVE ([facility] ONLY)  ETHANOL  MAGNESIUM  COMPREHENSIVE METABOLIC PANEL  CBG MONITORING, ED  TYPE AND SCREEN   ____________________________________________  EKG   Date: 07/31/2021 0539  Rate: 73  Rhythm: normal sinus rhythm  QRS Axis: normal  Intervals: Prolonged QT interval of 536 ms  ST/T Wave abnormalities: normal  Conduction Disutrbances: none  Narrative Interpretation: Prolonged QT interval    ____________________________________________  RADIOLOGY I, Dana Debo, personally viewed and evaluated these images  (plain radiographs) as part of my medical decision making, as well as reviewing the written report by the radiologist.  ED MD interpretation: Persistent right-sided aspiration pneumonia.  Official radiology report(s): DG Chest Portable 1 View  Result Date: 07/31/2021 CLINICAL DATA:  55 year old female with cancer at the thoracic inlet, coughing and vomiting blood. Aspiration. EXAM: PORTABLE CHEST 1 VIEW COMPARISON:  Chest CTA 07/28/2021 and earlier. FINDINGS: Portable AP upright view at 0540 hours. Lung volumes remain normal. Heart size and central mediastinal contours remain within normal limits. Vague increased right paratracheal density corresponds to indistinct right thoracic inlet soft tissue mass seen by CT. Mild leftward deviation of the trachea but no significant tracheal narrowing. Patchy, spiculated mid right lung opacity as seen recently by CT. But otherwise allowing for portable technique the lungs are clear. No acute osseous abnormality identified. Negative visible bowel gas. IMPRESSION: 1. Patchy right mid lung opacity stable from the recent CT. 2. Vague increased right paratracheal density corresponding to large right thoracic inlet mass. 3. No new cardiopulmonary abnormality. Electronically Signed   By: Genevie Ann M.D.   On: 07/31/2021 05:52    ____________________________________________   PROCEDURES  Procedure(s) performed (including Critical Care):  Procedures  CRITICAL CARE Performed by: Cyril Mourning Daisy Mcneel   Total critical care time: 45 minutes  Critical care time was exclusive of separately billable procedures and treating other patients.  Critical care was necessary to treat or prevent imminent or life-threatening deterioration.  Critical care was time spent personally by me on the following activities: development of treatment plan with patient and/or surrogate as well as nursing, discussions with consultants, evaluation of patient's response to treatment, examination of  patient, obtaining history from patient or surrogate, ordering and performing treatments and interventions, ordering and review of laboratory studies, ordering and review of radiographic studies, pulse oximetry and re-evaluation of patient's condition.  ____________________________________________   INITIAL IMPRESSION / ASSESSMENT AND PLAN / ED COURSE  As part of my medical decision making, I reviewed the following data within the [place] notes reviewed and incorporated, Labs reviewed , EKG interpreted , Old EKG reviewed, Old chart reviewed, Radiograph reviewed , Discussed with admitting physician , and Notes from prior ED visits  Patient here in the emergency department after she left AGAINST MEDICAL ADVICE less than 24 hours ago for continued hemoptysis and neck pain in the setting of a large esophageal mass.  Patient stating that she was scared and now wants to be readmitted to the hospital.  I suspect that her alcohol abuse had a role in why she left.  She appears intoxicated currently and is frequently falling asleep but is protecting her airway.  We will hold off on any further sedatives including pain medication in the emergency department.  Chest x-ray obtained which shows again right-sided aspiration.  She was on Rocephin and Flagyl when she left AGAINST MEDICAL ADVICE.  We will restart these medications.  She is currently hemodynamically stable and is coughing up small amounts of blood but is not in extremis or hemorrhaging.  We will continue to monitor closely.  Patient will need readmission.  ED PROGRESS  Patient's hemoglobin is 11 which has improved compared to most recent yesterday at 9.4.  Received confirmation from [facility] in lab that her chemistry is normal other than minimally elevated liver function test.  Ammonia level normal.  Alcohol level pending.  Will discuss with medicine for admission.  7:11 AM Discussed patient's case with hospitalist,  Dr. Blaine Hamper.  I have recommended admission and patient (and family if present) agree with this plan. Admitting physician will place admission orders.   I reviewed all nursing notes, vitals, pertinent previous records and reviewed/interpreted all EKGs, lab and urine results, imaging (as available).  ____________________________________________   FINAL CLINICAL IMPRESSION(S) / ED DIAGNOSES  Final diagnoses:  Hemoptysis  Esophageal mass  Aspiration pneumonia of both lungs, unspecified aspiration pneumonia type, unspecified part of lung ([place])  Prolonged Q-T interval on ECG     ED Discharge Orders     None       *Please note:  JULIENE KIRSH was evaluated in Emergency Department on 07/31/2021 for the symptoms described in the history of present illness. She was evaluated in the context of the global COVID-19 pandemic, which necessitated consideration that the patient might be at risk for infection with the SARS-CoV-2 virus that causes COVID-19. Institutional protocols and algorithms that pertain to the evaluation of patients at risk for COVID-19 are in a state of rapid change based on information released by regulatory bodies including the CDC and federal and state organizations. These policies and algorithms were followed during the patient's care in the ED.  Some ED evaluations and interventions may be delayed as a result of limited staffing during and the pandemic.*   Note:  This document was prepared using Dragon voice recognition software and may include unintentional dictation errors.    Loredana Medellin, Delice Bison, DO 07/31/21 [phone]

## 2021-07-31 NOTE — ED Notes (Signed)
This RN was contacted about this pt being brought back to the ED due "being intoxicated". OR staff states the whole OR room smelled like alcohol but this RN has not smelled any alcohol since this RN has been with pt since 0700 this morning.   It was reported off to OR RN that pt was being medicated for CIWA due to being a daily drinker and pt was    Pt's ETOH was negative

## 2021-08-01 ENCOUNTER — Inpatient Hospital Stay: Payer: Medicaid Other

## 2021-08-01 DIAGNOSIS — G9341 Metabolic encephalopathy: Secondary | ICD-10-CM | POA: Diagnosis present

## 2021-08-01 DIAGNOSIS — Z515 Encounter for palliative care: Secondary | ICD-10-CM

## 2021-08-01 DIAGNOSIS — E43 Unspecified severe protein-calorie malnutrition: Secondary | ICD-10-CM | POA: Diagnosis present

## 2021-08-01 LAB — CBC
HCT: 28.9 % — ABNORMAL LOW (ref 36.0–46.0)
Hemoglobin: 9.1 g/dL — ABNORMAL LOW (ref 12.0–15.0)
MCH: 33.6 pg (ref 26.0–34.0)
MCHC: 31.5 g/dL (ref 30.0–36.0)
MCV: 106.6 fL — ABNORMAL HIGH (ref 80.0–100.0)
Platelets: 285 10*3/uL (ref 150–400)
RBC: 2.71 MIL/uL — ABNORMAL LOW (ref 3.87–5.11)
RDW: 13.1 % (ref 11.5–15.5)
WBC: 4.4 10*3/uL (ref 4.0–10.5)
nRBC: 0 % (ref 0.0–0.2)

## 2021-08-01 LAB — BASIC METABOLIC PANEL
Anion gap: 9 (ref 5–15)
BUN: <5 mg/dL — ABNORMAL LOW (ref 6–20)
CO2: 22 mmol/L (ref 22–32)
Calcium: 7.9 mg/dL — ABNORMAL LOW (ref 8.9–10.3)
Chloride: 105 mmol/L (ref 98–111)
Creatinine, Ser: 0.95 mg/dL (ref 0.44–1.00)
GFR, Estimated: >60 mL/min (ref >60)
Glucose, Bld: 100 mg/dL — ABNORMAL HIGH (ref 70–99)
Potassium: 3.2 mmol/L — ABNORMAL LOW (ref 3.5–5.1)
Sodium: 136 mmol/L (ref 135–145)

## 2021-08-01 LAB — EXPECTORATED SPUTUM ASSESSMENT W GRAM STAIN, RFLX TO RESP C

## 2021-08-01 LAB — GLUCOSE, CAPILLARY
Glucose-Capillary: 110 mg/dL — ABNORMAL HIGH (ref 70–99)
Glucose-Capillary: 63 mg/dL — ABNORMAL LOW (ref 70–99)
Glucose-Capillary: 79 mg/dL (ref 70–99)
Glucose-Capillary: 85 mg/dL (ref 70–99)
Glucose-Capillary: 94 mg/dL (ref 70–99)
Glucose-Capillary: 95 mg/dL (ref 70–99)

## 2021-08-01 MED ORDER — OLANZAPINE 10 MG IM SOLR
5.0000 mg | Freq: Every day | INTRAMUSCULAR | Status: DC | PRN
  Filled 2021-08-01: qty 10

## 2021-08-01 MED ORDER — HYDROMORPHONE HCL 1 MG/ML IJ SOLN
0.5000 mg | INTRAMUSCULAR | Status: DC | PRN
  Administered 2021-08-01 – 2021-08-03 (×12): 0.5 mg via INTRAVENOUS
  Filled 2021-08-01 (×13): qty 1

## 2021-08-01 MED ORDER — POTASSIUM CHLORIDE 10 MEQ/100ML IV SOLN
10.0000 meq | INTRAVENOUS | Status: AC
  Administered 2021-08-01 (×5): 10 meq via INTRAVENOUS
  Filled 2021-08-01 (×5): qty 100

## 2021-08-01 MED ORDER — DEXTROSE 50 % IV SOLN: 12.5000 g | INTRAVENOUS | Status: AC

## 2021-08-01 MED ORDER — DEXTROSE 50 % IV SOLN
INTRAVENOUS | Status: AC
  Administered 2021-08-01: 12.5 g via INTRAVENOUS
  Filled 2021-08-01: qty 50

## 2021-08-01 MED ORDER — METRONIDAZOLE 500 MG/100ML IV SOLN
500.0000 mg | Freq: Two times a day (BID) | INTRAVENOUS | Status: DC
  Administered 2021-08-01 – 2021-08-05 (×8): 500 mg via INTRAVENOUS
  Filled 2021-08-01 (×9): qty 100

## 2021-08-01 NOTE — Consult Note (Signed)
[facility] Face-to-Face Psychiatry Consult   Reason for Consult:  Mood Referring Physician:  Yu Patient Identification: [name] MRN:  [id number] Principal Diagnosis: Hemoptysis Diagnosis:  Principal Problem:   Hemoptysis Active Problems:   Chronic pancreatitis ([place])   Hypothyroidism   Hypertension   Nicotine dependence   Bipolar 2 disorder (HCC)   Aspiration pneumonia (HCC)   Transaminitis   Dysphagia   Alcohol abuse   Squamous cell carcinoma of esophagus (HCC)   GERD (gastroesophageal reflux disease)   Macrocytic anemia   Protein-calorie malnutrition, moderate (HCC)   Acute metabolic encephalopathy   Palliative care encounter   Protein-calorie malnutrition, severe   Total Time spent with patient: 15 minutes  Subjective:   [name] is a 55 y.o. female patient admitted with Hemoptysis.  HPI:  Patient seen on consult from oncology. Writer arrived to patient's room. Patient was sitting up in the bed trying to eat an icee. She appears very uncomfortable. Her partner is at bedside. Writer introduced herself and patient said "I don't mean to be rude, but we are fine.' Writer sat in a chair and assured patient that she is certainly not being rude, and we are her to try to help in any way we can. Patient was silent for about 30 seconds. Writer told patient and her partner that we can return tomorrow. Patient's partner said "please do."    Writer spoke with patient's bedside RN, Judson Roch, who voiced that patient does appear to be upset with her diagnosis. Patient was throwing things at family earlier today.  Patient is receiving ativan every 6 hours and is on CIWA protocol.    Past Psychiatric History: per chart review- bi-polar 2 disorder; alcohol use disorder    Risk to Self:   Risk to Others:   Prior Inpatient Therapy:   Prior Outpatient Therapy:    Past Medical History:  Past Medical History:  Diagnosis Date   Alcohol abuse    Asthma    Cancer ([place])    "carcinoma of throat"  with s/p thyroidectomy   Chronic pain syndrome    Chronic pancreatitis (HCC)    COPD (chronic obstructive pulmonary disease) (HCC)    Depression    Hepatitis C    History of alcohol abuse    History of drug use    Hypertension 03/18/2013   Hypothyroidism    Low back pain    Pancreatic pseudocyst 03/18/2015   Seizures ([place])    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    Past Surgical History:  Procedure Laterality Date   CESAREAN SECTION     GASTROSTOMY TUBE PLACEMENT  2011   TOTAL THYROIDECTOMY     Family History:  Family History  Problem Relation Age of Onset   Diabetes Mellitus II Maternal Grandfather    CAD Maternal Grandfather    Diabetes Mellitus II Mother    Liver cancer Mother    Arthritis Mother    Cancer Mother        lung   Cancer Father    Thyroid disease Son    Diabetes Mellitus II Maternal Aunt    Pancreatic cancer Paternal Aunt    Family Psychiatric  History: unknown Social History:  Social History   Substance and Sexual Activity  Alcohol Use Yes   Alcohol/week: 0.0 standard drinks   Comment: pt states she has a drink every day     Social History   Substance and Sexual Activity  Drug Use Yes   Types: Marijuana, Cocaine  Comment: History of drug use    Social History   Socioeconomic History   Marital status: Single    Spouse name: Not on file   Number of children: Not on file   Years of education: Not on file   Highest education level: Not on file  Occupational History   Not on file  Tobacco Use   Smoking status: Every Day    Packs/day: 0.25    Types: Cigarettes   Smokeless tobacco: Never  Vaping Use   Vaping Use: Never used  Substance and Sexual Activity   Alcohol use: Yes    Alcohol/week: 0.0 standard drinks    Comment: pt states she has a drink every day   Drug use: Yes    Types: Marijuana, Cocaine    Comment: History of drug use   Sexual activity: Not on file  Other Topics Concern   Not on file  Social History Narrative   Not on  file   Social Determinants of Health   Financial Resource Strain: High Risk   Difficulty of Paying Living Expenses: Very hard  Food Insecurity: No Food Insecurity   Worried About Charity fundraiser in the Last Year: Never true   Ran Out of Food in the Last Year: Never true  Transportation Needs: No Transportation Needs   Lack of Transportation (Medical): No   Lack of Transportation (Non-Medical): No  Physical Activity: Inactive   Days of Exercise per Week: 0 days   Minutes of Exercise per Session: 0 min  Stress: Stress Concern Present   Feeling of Stress : Very much  Social Connections: Socially Isolated   Frequency of Communication with Friends and Family: Never   Frequency of Social Gatherings with Friends and Family: Never   Attends Religious Services: Never   Marine scientist or Organizations: No   Attends Archivist Meetings: Never   Marital Status: Married   Additional Social History:    Allergies:   Allergies  Allergen Reactions   Penicillins Rash and Other (See Comments)    Has patient had a PCN reaction causing immediate rash, facial/tongue/throat swelling, SOB or lightheadedness with hypotension: No Has patient had a PCN reaction causing severe rash involving mucus membranes or skin necrosis: No Has patient had a PCN reaction that required hospitalization No Has patient had a PCN reaction occurring within the last 10 years: No If all of the above answers are "NO", then may proceed with Cephalosporin use.    Labs:  Results for orders placed or performed during the hospital encounter of 07/31/21 (from the past 48 hour(s))  Expectorated Sputum Assessment w Gram Stain, Rflx to Resp Cult     Status: None   Collection Time: 07/31/21  4:15 AM   Specimen: Expectorated Sputum  Result Value Ref Range   Specimen Description EXPECTORATED SPUTUM    Special Requests NONE    Sputum evaluation      Sputum specimen not acceptable for testing.  Please  recollect.   Lonn Georgia FREEMIRE  08/01/21 @ 0545 BY SB Performed at [facility], [address] [place], [place] [address]    Report Status 08/01/2021 FINAL   CBC with Differential/Platelet     Status: Abnormal   Collection Time: 07/31/21  5:42 AM  Result Value Ref Range   WBC 4.1 4.0 - 10.5 K/uL   RBC 3.19 (L) 3.87 - 5.11 MIL/uL   Hemoglobin 11.0 (L) 12.0 - 15.0 g/dL   HCT 33.2 (L) 36.0 -  46.0 %   MCV 104.1 (H) 80.0 - 100.0 fL   MCH 34.5 (H) 26.0 - 34.0 pg   MCHC 33.1 30.0 - 36.0 g/dL   RDW 12.8 11.5 - 15.5 %   Platelets 338 150 - 400 K/uL   nRBC 0.0 0.0 - 0.2 %   Neutrophils Relative % 68 %   Neutro Abs 2.8 1.7 - 7.7 K/uL   Lymphocytes Relative 26 %   Lymphs Abs 1.1 0.7 - 4.0 K/uL   Monocytes Relative 4 %   Monocytes Absolute 0.2 0.1 - 1.0 K/uL   Eosinophils Relative 1 %   Eosinophils Absolute 0.0 0.0 - 0.5 K/uL   Basophils Relative 1 %   Basophils Absolute 0.0 0.0 - 0.1 K/uL   Immature Granulocytes 0 %   Abs Immature Granulocytes 0.01 0.00 - 0.07 K/uL    Comment: Performed at [facility], [address] [place], [place] [address]  Protime-INR     Status: None   Collection Time: 07/31/21  5:42 AM  Result Value Ref Range   Prothrombin Time 14.5 11.4 - 15.2 seconds   INR 1.1 0.8 - 1.2    Comment: (NOTE) INR goal varies based on device and disease states. Performed at [facility], [address] [place], [place] [address]   Resp Panel by RT-PCR (Flu A&B, Covid) Nasopharyngeal Swab     Status: None   Collection Time: 07/31/21  5:42 AM   Specimen: Nasopharyngeal Swab; Nasopharyngeal(NP) swabs in vial transport medium  Result Value Ref Range   SARS Coronavirus 2 by RT PCR NEGATIVE NEGATIVE    Comment: (NOTE) SARS-CoV-2 target nucleic acids are NOT DETECTED.  The SARS-CoV-2 RNA is generally detectable in upper respiratory specimens during the acute phase of infection. The lowest concentration of SARS-CoV-2 viral copies this assay can detect  is 138 copies/mL. A negative result does not preclude SARS-Cov-2 infection and should not be used as the sole basis for treatment or other patient management decisions. A negative result may occur with  improper specimen collection/handling, submission of specimen other than nasopharyngeal swab, presence of viral mutation(s) within the areas targeted by this assay, and inadequate number of viral copies(<138 copies/mL). A negative result must be combined with clinical observations, patient history, and epidemiological information. The expected result is Negative.  Fact Sheet for Patients:  EntrepreneurPulse.com.au  Fact Sheet for Healthcare Providers:  IncredibleEmployment.be  This test is no t yet approved or cleared by the [place] FDA and  has been authorized for detection and/or diagnosis of SARS-CoV-2 by FDA under an Emergency Use Authorization (EUA). This EUA will remain  in effect (meaning this test can be used) for the duration of the COVID-19 declaration under Section 564(b)(1) of the Act, 21 U.S.C.section 360bbb-3(b)(1), unless the authorization is terminated  or revoked sooner.       Influenza A by PCR NEGATIVE NEGATIVE   Influenza B by PCR NEGATIVE NEGATIVE    Comment: (NOTE) The Xpert Xpress SARS-CoV-2/FLU/RSV plus assay is intended as an aid in the diagnosis of influenza from Nasopharyngeal swab specimens and should not be used as a sole basis for treatment. Nasal washings and aspirates are unacceptable for Xpert Xpress SARS-CoV-2/FLU/RSV testing.  Fact Sheet for Patients: EntrepreneurPulse.com.au  Fact Sheet for Healthcare Providers: IncredibleEmployment.be  This test is not yet approved or cleared by the [place] FDA and has been authorized for detection and/or diagnosis of SARS-CoV-2 by FDA under an Emergency Use Authorization (EUA). This EUA will remain in  effect (meaning this  test can be used) for the duration of the COVID-19 declaration under Section 564(b)(1) of the Act, 21 U.S.C. section 360bbb-3(b)(1), unless the authorization is terminated or revoked.  Performed at [facility], [address] [place], [place] [address]   Magnesium     Status: None   Collection Time: 07/31/21  5:42 AM  Result Value Ref Range   Magnesium 2.1 1.7 - 2.4 mg/dL    Comment: Performed at [facility], [address] [place], [place] [address]  Comprehensive metabolic panel     Status: Abnormal   Collection Time: 07/31/21  5:42 AM  Result Value Ref Range   Sodium 134 (L) 135 - 145 mmol/L   Potassium 3.8 3.5 - 5.1 mmol/L   Chloride 105 98 - 111 mmol/L   CO2 23 22 - 32 mmol/L   Glucose, Bld 86 70 - 99 mg/dL    Comment: Glucose reference range applies only to samples taken after fasting for at least 8 hours.   BUN <5 (L) 6 - 20 mg/dL   Creatinine, Ser 0.81 0.44 - 1.00 mg/dL   Calcium 8.9 8.9 - 10.3 mg/dL   Total Protein 8.5 (H) 6.5 - 8.1 g/dL   Albumin 3.9 3.5 - 5.0 g/dL   AST 77 (H) 15 - 41 U/L   ALT 26 0 - 44 U/L   Alkaline Phosphatase 50 38 - 126 U/L   Total Bilirubin 1.3 (H) 0.3 - 1.2 mg/dL   GFR, Estimated >60 >60 mL/min    Comment: (NOTE) Calculated using the CKD-EPI Creatinine Equation (2021)    Anion gap 6 5 - 15    Comment: Performed at [facility], [address] [place], [place] [address]  Ethanol     Status: None   Collection Time: 07/31/21  6:17 AM  Result Value Ref Range   Alcohol, Ethyl (B) <10 <10 mg/dL    Comment: (NOTE) Lowest detectable limit for serum alcohol is 10 mg/dL.  For medical purposes only. Performed at [facility], [address] [place], [place] [address]   Ammonia     Status: None   Collection Time: 07/31/21  6:17 AM  Result Value Ref Range   Ammonia 17 9 - 35 umol/L    Comment: Performed at [facility], [address] [place], [place] [address]  CBG monitoring, ED      Status: None   Collection Time: 07/31/21  7:16 AM  Result Value Ref Range   Glucose-Capillary 76 70 - 99 mg/dL    Comment: Glucose reference range applies only to samples taken after fasting for at least 8 hours.  Type and screen     Status: None   Collection Time: 07/31/21  7:39 AM  Result Value Ref Range   ABO/RH(D) O POS    Antibody Screen NEG    Sample Expiration      08/03/2021,2359 Performed at [facility], [address] [place],  [address]   CBC     Status: Abnormal   Collection Time: 07/31/21  2:35 PM  Result Value Ref Range   WBC 4.5 4.0 - 10.5 K/uL   RBC 3.01 (L) 3.87 - 5.11 MIL/uL   Hemoglobin 10.2 (L) 12.0 - 15.0 g/dL   HCT 31.6 (L) 36.0 - 46.0 %   MCV 105.0 (H) 80.0 - 100.0 fL   MCH 33.9 26.0 - 34.0 pg   MCHC 32.3 30.0 - 36.0 g/dL   RDW 12.9 11.5 -  15.5 %   Platelets 330 150 - 400 K/uL   nRBC 0.0 0.0 - 0.2 %    Comment: Performed at [facility], [address] [place], [place] [address]  APTT     Status: Abnormal   Collection Time: 07/31/21  2:35 PM  Result Value Ref Range   aPTT 39 (H) 24 - 36 seconds    Comment:        IF BASELINE aPTT IS ELEVATED, SUGGEST PATIENT RISK ASSESSMENT BE USED TO DETERMINE APPROPRIATE ANTICOAGULANT THERAPY. Performed at [facility], [address] [place], [place] [address]   CBG monitoring, ED     Status: None   Collection Time: 07/31/21 10:00 PM  Result Value Ref Range   Glucose-Capillary 70 70 - 99 mg/dL    Comment: Glucose reference range applies only to samples taken after fasting for at least 8 hours.  CBC     Status: Abnormal   Collection Time: 07/31/21 11:27 PM  Result Value Ref Range   WBC 4.9 4.0 - 10.5 K/uL   RBC 2.63 (L) 3.87 - 5.11 MIL/uL   Hemoglobin 8.8 (L) 12.0 - 15.0 g/dL   HCT 27.8 (L) 36.0 - 46.0 %   MCV 105.7 (H) 80.0 - 100.0 fL   MCH 33.5 26.0 - 34.0 pg   MCHC 31.7 30.0 - 36.0 g/dL   RDW 13.1 11.5 - 15.5 %   Platelets 309 150 - 400 K/uL   nRBC 0.0 0.0 -  0.2 %    Comment: Performed at [facility], [address] [place], [place] [address]  Glucose, capillary     Status: Abnormal   Collection Time: 07/31/21 11:50 PM  Result Value Ref Range   Glucose-Capillary 63 (L) 70 - 99 mg/dL    Comment: Glucose reference range applies only to samples taken after fasting for at least 8 hours.  Glucose, capillary     Status: None   Collection Time: 08/01/21 12:28 AM  Result Value Ref Range   Glucose-Capillary 85 70 - 99 mg/dL    Comment: Glucose reference range applies only to samples taken after fasting for at least 8 hours.  Glucose, capillary     Status: None   Collection Time: 08/01/21  5:00 AM  Result Value Ref Range   Glucose-Capillary 95 70 - 99 mg/dL    Comment: Glucose reference range applies only to samples taken after fasting for at least 8 hours.  Basic metabolic panel     Status: Abnormal   Collection Time: 08/01/21  5:14 AM  Result Value Ref Range   Sodium 136 135 - 145 mmol/L   Potassium 3.2 (L) 3.5 - 5.1 mmol/L   Chloride 105 98 - 111 mmol/L   CO2 22 22 - 32 mmol/L   Glucose, Bld 100 (H) 70 - 99 mg/dL    Comment: Glucose reference range applies only to samples taken after fasting for at least 8 hours.   BUN <5 (L) 6 - 20 mg/dL   Creatinine, Ser 0.95 0.44 - 1.00 mg/dL   Calcium 7.9 (L) 8.9 - 10.3 mg/dL   GFR, Estimated >60 >60 mL/min    Comment: (NOTE) Calculated using the CKD-EPI Creatinine Equation (2021)    Anion gap 9 5 - 15    Comment: Performed at [facility], [address] [place],  [address]  CBC     Status: Abnormal   Collection Time: 08/01/21  5:19 AM  Result Value Ref Range   WBC  4.4 4.0 - 10.5 K/uL   RBC 2.71 (L) 3.87 - 5.11 MIL/uL   Hemoglobin 9.1 (L) 12.0 - 15.0 g/dL   HCT 28.9 (L) 36.0 - 46.0 %   MCV 106.6 (H) 80.0 - 100.0 fL   MCH 33.6 26.0 - 34.0 pg   MCHC 31.5 30.0 - 36.0 g/dL   RDW 13.1 11.5 - 15.5 %   Platelets 285 150 - 400 K/uL   nRBC 0.0 0.0 - 0.2 %    Comment:  Performed at [facility], [address] [place], [place] [address]  Glucose, capillary     Status: None   Collection Time: 08/01/21  8:45 AM  Result Value Ref Range   Glucose-Capillary 79 70 - 99 mg/dL    Comment: Glucose reference range applies only to samples taken after fasting for at least 8 hours.  Glucose, capillary     Status: Abnormal   Collection Time: 08/01/21 12:13 PM  Result Value Ref Range   Glucose-Capillary 110 (H) 70 - 99 mg/dL    Comment: Glucose reference range applies only to samples taken after fasting for at least 8 hours.  Glucose, capillary     Status: None   Collection Time: 08/01/21  3:29 PM  Result Value Ref Range   Glucose-Capillary 94 70 - 99 mg/dL    Comment: Glucose reference range applies only to samples taken after fasting for at least 8 hours.    Current Facility-Administered Medications  Medication Dose Route Frequency Provider Last Rate Last Admin   cefTRIAXone (ROCEPHIN) 2 g in sodium chloride 0.9 % 100 mL IVPB  2 g Intravenous Q24H Ivor Costa, MD   Stopped at 08/01/21 0552   dextromethorphan-guaiFENesin ([place] DM) 30-600 MG per 12 hr tablet 1 tablet  1 tablet Oral BID PRN Ivor Costa, MD       dextrose 5 %-0.9 % sodium chloride infusion   Intravenous Continuous Ivor Costa, MD 100 mL/hr at 08/01/21 1511 New Bag at 08/01/21 1511   glucagon (human recombinant) (GLUCAGEN) injection 0.5 mg  0.5 mg Intramuscular STAT Ivor Costa, MD       hydrALAZINE (APRESOLINE) injection 5 mg  5 mg Intravenous Q2H PRN Ivor Costa, MD       HYDROmorphone (DILAUDID) injection 0.5 mg  0.5 mg Intravenous Q3H PRN Borders, Kirt Boys, NP   0.5 mg at 08/01/21 1741   levothyroxine (SYNTHROID, LEVOTHROID) injection 100 mcg  100 mcg Intravenous Daily Ivor Costa, MD   100 mcg at 08/01/21 0905   LORazepam (ATIVAN) injection 0-4 mg  0-4 mg Intravenous Q6H Ivor Costa, MD   1 mg at 08/01/21 1749   [START ON 08/02/2021] LORazepam (ATIVAN) injection 0-4 mg  0-4 mg Intravenous  Q12H Ivor Costa, MD       Or   Derrill Memo ON 08/02/2021] LORazepam (ATIVAN) tablet 0-4 mg  0-4 mg Oral Q12H Ivor Costa, MD       metroNIDAZOLE (FLAGYL) IVPB 500 mg  500 mg Intravenous Q12H Dorothe Pea, RPH       nicotine (NICODERM CQ - dosed in mg/24 hours) patch 21 mg  21 mg Transdermal Daily Ivor Costa, MD   21 mg at 08/01/21 0919   OLANZapine (ZYPREXA) injection 5 mg  5 mg Intramuscular Daily PRN Waldon Merl F, NP       pantoprazole (PROTONIX) injection 40 mg  40 mg Intravenous Huntley Dec, MD   40 mg at 08/01/21 0914   potassium chloride 10 mEq in 100 mL IVPB  10 mEq Intravenous Q1 Hr x 5 Pahwani, Ravi, MD 100 mL/hr at 08/01/21 1754 10 mEq at 08/01/21 1754   thiamine (B-1) injection 100 mg  100 mg Intravenous Daily Ivor Costa, MD   100 mg at 08/01/21 0915    Musculoskeletal: Strength & Muscle Tone: within normal limits Gait & Station: normal Patient leans: N/A   Psychiatric Specialty Exam:  Presentation  General Appearance: Appropriate for Environment Eye Contact:Absent Speech:No data recorded Speech Volume:Decreased Handedness:No data recorded  Mood and Affect  Mood:Depressed Affect:Blunt  Thought Process  Thought Processes:Coherent Descriptions of Associations:-- (UTA) Orientation:No data recorded Thought Content:No data recorded History of Schizophrenia/Schizoaffective disorder:No data recorded Duration of Psychotic Symptoms:No data recorded Hallucinations:Hallucinations: -- (UTA, DOES NOT APPEAR TO HAVE) Ideas of Reference:No data recorded Suicidal Thoughts:Suicidal Thoughts: -- (UTA) Homicidal Thoughts:Homicidal Thoughts: -- Pincus Badder)  Sensorium  Memory:No data recorded Judgment:No data recorded Insight:No data recorded  Executive Functions  Concentration:No data recorded Attention Span:No data recorded Recall:No data recorded [place] recorded Language:No data recorded  Psychomotor Activity  Psychomotor Activity:No data  recorded  Assets  Assets:No data recorded  Sleep  Sleep:No data recorded  Physical Exam: Physical Exam Vitals and nursing note reviewed.  Constitutional:      Appearance: She is ill-appearing.  HENT:     Head: Normocephalic.     Nose:     Comments: UTA     Mouth/Throat:     Comments: Patient with hoarse voice, trying to eat icee Eyes:     General:        Right eye: No discharge.        Left eye: No discharge.  Pulmonary:     Effort: Pulmonary effort is normal.  Musculoskeletal:        General: Normal range of motion.     Cervical back: Normal range of motion.  Skin:    General: Skin is dry.  Neurological:     Mental Status: She is oriented to person, place, and time.  Psychiatric:        Attention and Perception: She is inattentive.        Mood and Affect: Mood is anxious. Affect is blunt.        Speech: Speech normal.        Cognition and Memory: Cognition normal.   Review of Systems  Reason unable to perform ROS: Patient on ONC floor undergoing treatment. Did not voice complaints with ROS.  Respiratory:         On medical unit for hemoptysis and neck mass   Psychiatric/Behavioral:  Positive for depression (hx of) and substance abuse (Hx of substance abuse).        Decline consultation at this time  All other systems reviewed and are negative. Blood pressure 122/81, pulse 75, temperature 98.2 F (36.8 C), resp. rate 18, height 5\' 1"  (1.549 m), weight 40 kg, SpO2 100 %. Body mass index is 16.66 kg/m.  Treatment Plan Summary: Plan Psychiatry will attempt evaluation tomorrow  Discussed with Dr. Weber Cooks. Ordered Zyprexa 5 mg IM for agitation prn  Disposition: Supportive therapy provided about ongoing stressors.Psychiatry to try to assess again 11/18.  Sherlon Handing, NP 08/01/2021 6:26 PM

## 2021-08-01 NOTE — Consult Note (Signed)
[facility]  [phone] [phone]   Name: [name] Date: 08/01/2021 MRN: [id number]  DOB: [birth year]  Patient Care Team: Venita Lick, NP as PCP - General (Nurse Practitioner) Vanita Ingles, RN as Registered Nurse (General Practice)    REASON FOR CONSULTATION: [name] is a 55 y.o. female with multiple medical problems including chronic pancreatitis, alcoholism, hypothyroidism status post thyroidectomy, bipolar disorder, HCV, and COPD.  Patient was recently hospitalized 11/13-11/15 with aspiration pneumonia and was found to have a large neck mass.  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  Patient ultimately left AMA but was readmitted 07/31/2021 with hemoptysis.  Palliative care was consulted to address goals.  SOCIAL HISTORY:     reports that she has been smoking cigarettes. She has been smoking an average of .25 packs per day. She has never used smokeless tobacco. She reports current alcohol use. She reports current drug use. Drugs: Marijuana and Cocaine.  Patient is unmarried.  She lives at home with her significant other.  She has a daughter in [place] from whom she is reportedly estranged.  She has an aunt who is involved.  Patient does not have consistent occupational history.  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    ALLERGIES:  is allergic to penicillins.  MEDICATIONS:  Current Facility-Administered Medications  Medication Dose Route Frequency Provider Last Rate Last Admin   cefTRIAXone (ROCEPHIN) 2 g in sodium chloride 0.9 % 100 mL IVPB  2 g Intravenous Q24H Ivor Costa, MD   Stopped at 08/01/21 0552   dextromethorphan-guaiFENesin ([place] DM) 30-600 MG per 12 hr tablet 1 tablet  1 tablet Oral BID PRN Ivor Costa, MD       dextrose 5 %-0.9 % sodium chloride infusion   Intravenous Continuous Ivor Costa, MD 100 mL/hr at 08/01/21 0922 New Bag at 08/01/21 0922   glucagon (human recombinant) (GLUCAGEN) injection 0.5 mg  0.5 mg Intramuscular STAT Ivor Costa, MD       hydrALAZINE (APRESOLINE) injection 5 mg  5 mg Intravenous Q2H PRN Ivor Costa, MD       HYDROmorphone (DILAUDID) injection 0.5 mg  0.5 mg Intravenous Q3H PRN Makinzee Durley, Kirt Boys, NP   0.5 mg at 08/01/21 1151   levothyroxine (SYNTHROID, LEVOTHROID) injection 100 mcg  100 mcg Intravenous Daily Ivor Costa, MD   100 mcg at 08/01/21 0905   LORazepam (ATIVAN) injection 0-4 mg  0-4 mg Intravenous Q6H Ivor Costa, MD   1 mg at 08/01/21 8110   [  START ON 08/02/2021] LORazepam (ATIVAN) injection 0-4 mg  0-4 mg Intravenous Q12H Ivor Costa, MD       Or   Derrill Memo ON 08/02/2021] LORazepam (ATIVAN) tablet 0-4 mg  0-4 mg Oral Q12H Ivor Costa, MD       metroNIDAZOLE (FLAGYL) IVPB 500 mg  500 mg Intravenous Q12H Dorothe Pea, RPH       nicotine (NICODERM CQ - dosed in mg/24 hours) patch 21 mg  21 mg Transdermal Daily Ivor Costa, MD   21 mg at 08/01/21 0919   pantoprazole (PROTONIX) injection 40 mg  40 mg Intravenous Q12H Ivor Costa, MD   40 mg at 08/01/21 0914   potassium chloride 10 mEq in 100 mL IVPB  10 mEq Intravenous Q1 Hr x 5 Pahwani, Ravi, MD  100 mL/hr at 08/01/21 1117 10 mEq at 08/01/21 1117   thiamine (B-1) injection 100 mg  100 mg Intravenous Daily Ivor Costa, MD   100 mg at 08/01/21 0915    VITAL SIGNS: BP 111/85 (BP Location: Left Arm)   Pulse 76   Temp 98.8 F (37.1 C)   Resp 16   Ht _0  (1.549 m)   Wt 88 lb 2.9 oz (40 kg)   LMP  (LMP Unknown)   SpO2 98%   BMI 16.66 kg/m  Filed Weights   07/31/21 0532  Weight: 88 lb 2.9 oz (40 kg)    Estimated body mass index is 16.66 kg/m as calculated from the following:   Height as of this encounter: _1  (1.549 m).   Weight as of this encounter: 88 lb 2.9 oz (40 kg).  LABS: CBC:    Component Value Date/Time   WBC 4.4 08/01/2021 0519   HGB 9.1 (L) 08/01/2021 0519   HGB 13.6 08/16/2020 1613   HCT 28.9 (L) 08/01/2021 0519   HCT 40.0 08/16/2020 1613   PLT 285 08/01/2021 0519   PLT 245 08/16/2020 1613   MCV 106.6 (H) 08/01/2021 0519   MCV 101 (H) 08/16/2020 1613   MCV 109 (H) 12/27/2013 0847   NEUTROABS 2.8 07/31/2021 0542   NEUTROABS 2.5 08/16/2020 1613   NEUTROABS 8.1 (H) 03/18/2013 0525   LYMPHSABS 1.1 07/31/2021 0542   LYMPHSABS 2.3 08/16/2020 1613   LYMPHSABS 1.1 03/18/2013 0525   MONOABS 0.2 07/31/2021 0542   MONOABS 0.7 03/18/2013 0525   EOSABS 0.0 07/31/2021 0542   EOSABS 0.2 08/16/2020 1613   EOSABS 0.0 03/18/2013 0525   BASOSABS 0.0 07/31/2021 0542   BASOSABS 0.1 08/16/2020 1613   BASOSABS 0.0 03/18/2013 0525   Comprehensive Metabolic Panel:    Component Value Date/Time   NA 136 08/01/2021 0514   NA 136 08/16/2020 1613   NA 137 12/27/2013 0847   K 3.2 (L) 08/01/2021 0514   K 3.4 (L) 12/27/2013 0847   CL 105 08/01/2021 0514   CL 104 12/27/2013 0847   CO2 22 08/01/2021 0514   CO2 27 12/27/2013 0847   BUN <5 (L) 08/01/2021 0514   BUN 14 08/16/2020 1613   BUN 13 12/27/2013 0847   CREATININE 0.95 08/01/2021 0514   CREATININE 0.66 12/27/2013 0847   GLUCOSE 100 (H) 08/01/2021 0514   GLUCOSE 90 12/27/2013 0847   CALCIUM 7.9 (L) 08/01/2021  0514   CALCIUM 8.7 12/27/2013 0847   AST 77 (H) 07/31/2021 0542   AST 470 (H) 12/27/2013 0847   ALT 26 07/31/2021 0542   ALT 356 (H) 12/27/2013 0847   ALKPHOS 50 07/31/2021 0542   ALKPHOS  147 (H) 12/27/2013 0847   BILITOT 1.3 (H) 07/31/2021 0542   BILITOT 0.5 08/16/2020 1613   BILITOT 0.5 12/27/2013 0847   PROT 8.5 (H) 07/31/2021 0542   PROT 7.9 08/16/2020 1613   PROT 8.4 (H) 12/27/2013 0847   ALBUMIN 3.9 07/31/2021 0542   ALBUMIN 4.6 08/16/2020 1613   ALBUMIN 3.3 (L) 12/27/2013 0847    RADIOGRAPHIC STUDIES: CT HEAD WO CONTRAST (5MM)  Result Date: 08/01/2021 CLINICAL DATA:  Delirium.  Esophageal cancer EXAM: CT HEAD WITHOUT CONTRAST TECHNIQUE: Contiguous axial images were obtained from the base of the skull through the vertex without intravenous contrast. COMPARISON:  None. FINDINGS: Brain: No evidence of acute infarction, hemorrhage, hydrocephalus, extra-axial collection or mass lesion/mass effect. Vascular: Negative for hyperdense vessel Skull: Negative Sinuses/Orbits: Negative Other: None IMPRESSION: Negative CT head Electronically Signed   By: Franchot Gallo M.D.   On: 08/01/2021 09:29   CT Soft Tissue Neck W Contrast  Result Date: 07/28/2021 CLINICAL DATA:  Abdominal pain for 4 months, worsening. History of alcohol abuse. Neck abscess, deep tissue. EXAM: CT NECK WITH CONTRAST TECHNIQUE: Multidetector CT imaging of the neck was performed using the standard protocol following the bolus administration of intravenous contrast. CONTRAST:  26m OMNIPAQUE IOHEXOL 300 MG/ML  SOLN COMPARISON:  None. FINDINGS: Pharynx, larynx, and esophagus: At the level of the thoracic inlet is a large mass measuring up to 6.7 cm, displacing the carotid sheath anteriorly, the trachea anteriorly and leftward, in the esophagus leftward. Margins with the right wall of the esophagus are indistinct and there is a gas channel in the region of the expected esophageal wall. History of abscess but the mass appears  more solid and necrotic appearing with limited regional fat edema and encasement of vessels including the right proximal vertebral artery, right common carotid, and proximal right subclavian. FBurtis Junesan exophytic esophageal mass. Salivary glands: No inflammation, mass, or stone. Thyroid: Atrophic if not resected. Lymph nodes: 10 mm left level 4 lymph node. Vascular: Vessel encasement as above. No acute vascular finding. Atheromatous calcification. Limited intracranial: Negative Visualized orbits: Negative Mastoids and visualized paranasal sinuses: Clear Skeleton: Cervical spine degeneration Upper chest: Apical emphysema. IMPRESSION: 1. Nearly 7 cm mass at the thoracic inlet, favor locally advanced esophageal cancer with encasement of major vessels. The mass approaches the skin surface and may be amenable to percutaneous biopsy. 2. Single mildly enlarged left level 4 lymph node. Electronically Signed   By: JJorje GuildM.D.   On: 07/28/2021 05:48   CT Angio Chest PE W and/or Wo Contrast  Addendum Date: 07/28/2021   ADDENDUM REPORT: 07/28/2021 06:35 ADDENDUM: Omitted impression #3 of ascending aortic aneurysm (measuring 4.1 cm in diameter) significance to be determined by workup of the patient's neck mass. Electronically Signed   By: JJorje GuildM.D.   On: 07/28/2021 06:35   Result Date: 07/28/2021 CLINICAL DATA:  Abdominal distension in the left upper quadrant. Abdominal pain. EXAM: CT ANGIOGRAPHY CHEST CT ABDOMEN AND PELVIS WITH CONTRAST TECHNIQUE: Multidetector CT imaging of the chest was performed using the standard protocol during bolus administration of intravenous contrast. Multiplanar CT image reconstructions and MIPs were obtained to evaluate the vascular anatomy. Multidetector CT imaging of the abdomen and pelvis was performed using the standard protocol during bolus administration of intravenous contrast. CONTRAST:  732mOMNIPAQUE IOHEXOL 300 MG/ML  SOLN COMPARISON:  Abdominal CT 11/28/2015  FINDINGS: CTA CHEST FINDINGS Cardiovascular: Cardiomegaly. Small to moderate low-density pericardial effusion measuring up to 11 mm in thickness. Aneurysmal ascending aorta  measuring up to 4.1 cm in diameter. Mediastinum/Nodes: Mass at the thoracic inlet and upper mediastinum as described on dedicated neck CT no lymphadenopathy seen in the mediastinum. Lungs/Pleura: Airway thickening with debris in the right sided airways there is associated with airspace opacity in the inferior and posterior right upper lobe. Mild atelectasis in the medial right middle lobe and lingula. Musculoskeletal: Scoliosis. Review of the MIP images confirms the above findings. CT ABDOMEN and PELVIS FINDINGS Hepatobiliary: No focal liver abnormality.Prominence of intrahepatic bile ducts. No calcified cholelithiasis. Pancreas: Unremarkable. Spleen: Unremarkable. Adrenals/Urinary Tract: Negative adrenals. No hydronephrosis or stone. Unremarkable bladder. Stomach/Bowel:  No obstruction. No appendicitis. 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 There is also chronic occlusion of the distal splenic vein. Numerous upper abdominal collaterals. Atheromatous calcification of the aorta and iliacs. No mass or adenopathy. Reproductive:No pathologic findings. Other: No ascites or pneumoperitoneum. Musculoskeletal: No acute abnormalities. Review of the MIP images confirms the above findings. IMPRESSION: Chest CTA: 1. Large mass at the thoracic inlet, reference neck CT. 2. Extensive debris in right-sided airways with right upper lobe opacity likely reflecting pneumonia or aspiration. Abdominal CT: 1. No acute finding. 2. 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 Electronically Signed: By: Jorje Guild M.D. On: 07/28/2021 05:56   CT ABDOMEN PELVIS W CONTRAST  Addendum Date: 07/28/2021   ADDENDUM REPORT: 07/28/2021 06:35 ADDENDUM: Omitted impression #3 of ascending aortic  aneurysm (measuring 4.1 cm in diameter) significance to be determined by workup of the patient's neck mass. Electronically Signed   By: Jorje Guild M.D.   On: 07/28/2021 06:35   Result Date: 07/28/2021 CLINICAL DATA:  Abdominal distension in the left upper quadrant. Abdominal pain. EXAM: CT ANGIOGRAPHY CHEST CT ABDOMEN AND PELVIS WITH CONTRAST TECHNIQUE: Multidetector CT imaging of the chest was performed using the standard protocol during bolus administration of intravenous contrast. Multiplanar CT image reconstructions and MIPs were obtained to evaluate the vascular anatomy. Multidetector CT imaging of the abdomen and pelvis was performed using the standard protocol during bolus administration of intravenous contrast. CONTRAST:  7mL OMNIPAQUE IOHEXOL 300 MG/ML  SOLN COMPARISON:  Abdominal CT 11/28/2015 FINDINGS: CTA CHEST FINDINGS Cardiovascular: Cardiomegaly. Small to moderate low-density pericardial effusion measuring up to 11 mm in thickness. Aneurysmal ascending aorta measuring up to 4.1 cm in diameter. Mediastinum/Nodes: Mass at the thoracic inlet and upper mediastinum as described on dedicated neck CT no lymphadenopathy seen in the mediastinum. Lungs/Pleura: Airway thickening with debris in the right sided airways there is associated with airspace opacity in the inferior and posterior right upper lobe. Mild atelectasis in the medial right middle lobe and lingula. Musculoskeletal: Scoliosis. Review of the MIP images confirms the above findings. CT ABDOMEN and PELVIS FINDINGS Hepatobiliary: No focal liver abnormality.Prominence of intrahepatic bile ducts. No calcified cholelithiasis. Pancreas: Unremarkable. Spleen: Unremarkable. Adrenals/Urinary Tract: Negative adrenals. No hydronephrosis or stone. Unremarkable bladder. Stomach/Bowel:  No obstruction. No appendicitis. 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 There is also chronic occlusion of the distal splenic  vein. Numerous upper abdominal collaterals. Atheromatous calcification of the aorta and iliacs. No mass or adenopathy. Reproductive:No pathologic findings. Other: No ascites or pneumoperitoneum. Musculoskeletal: No acute abnormalities. Review of the MIP images confirms the above findings. IMPRESSION: Chest CTA: 1. Large mass at the thoracic inlet, reference neck CT. 2. Extensive debris in right-sided airways with right upper lobe opacity likely reflecting pneumonia or aspiration. Abdominal CT: 1. No acute finding. 2. Chronic venous occlusions in the portal  system with well-formed collaterals/varices. Electronically Signed: By: Jorje Guild M.D. On: 07/28/2021 05:56   DG Chest Portable 1 View  Result Date: 07/31/2021 CLINICAL DATA:  55 year old female with cancer at the thoracic inlet, coughing and vomiting blood. Aspiration. EXAM: PORTABLE CHEST 1 VIEW COMPARISON:  Chest CTA 07/28/2021 and earlier. FINDINGS: Portable AP upright view at 0540 hours. Lung volumes remain normal. Heart size and central mediastinal contours remain within normal limits. Vague increased right paratracheal density corresponds to indistinct right thoracic inlet soft tissue mass seen by CT. Mild leftward deviation of the trachea but no significant tracheal narrowing. Patchy, spiculated mid right lung opacity as seen recently by CT. But otherwise allowing for portable technique the lungs are clear. No acute osseous abnormality identified. Negative visible bowel gas. IMPRESSION: 1. Patchy right mid lung opacity stable from the recent CT. 2. Vague increased right paratracheal density corresponding to large right thoracic inlet mass. 3. No new cardiopulmonary abnormality. Electronically Signed   By: Genevie Ann M.D.   On: 07/31/2021 05:52   [place] CORE BIOPSY (SOFT TISSUE)  Result Date: 07/29/2021 INDICATION: Large neck mass at the thoracic inlet favored to be advanced esophageal cancer, dysphagia EXAM: ULTRASOUND CORE BIOPSY LARGE RIGHT NECK  MASS MEDICATIONS: 1% LIDOCAINE LOCAL ANESTHESIA/SEDATION: None. Total intra-service moderate Sedation Time: 0 minutes. The patient's level of consciousness and vital signs were monitored continuously by radiology nursing throughout the procedure under my direct supervision. FLUOROSCOPY TIME:  Fluoroscopy Time: None. COMPLICATIONS: None immediate. PROCEDURE: Informed written consent was obtained from the patient after a thorough discussion of the procedural risks, benefits and alternatives. All questions were addressed. Maximal Sterile Barrier Technique was utilized including caps, mask, sterile gowns, sterile gloves, sterile drape, hand hygiene and skin antiseptic. A timeout was performed prior to the initiation of the procedure. Previous imaging reviewed. The large predominately right neck mass was localized and marked just medial to the right carotid artery. Under sterile conditions and local anesthesia, an 18 gauge core biopsy needle was advanced into the right neck mass. Needle position confirmed with ultrasound. Images obtained for documentation. 3 18 gauge core biopsies obtained. Samples were intact and non fragmented. These were placed in formalin. Needle removed. Postprocedure imaging demonstrates no hemorrhage or hematoma. Patient tolerated the biopsy well. IMPRESSION: Successful ultrasound right neck mass 18 gauge core biopsies. Electronically Signed   By: Jerilynn Mages.  Shick M.D.   On: 07/29/2021 13:34    PERFORMANCE STATUS (ECOG) : 3 - Symptomatic, >50% confined to bed  Review of Systems Unless otherwise noted, a complete review of systems is negative.  Physical Exam General: NAD Pulmonary: Unlabored Abdomen: soft, nontender, + bowel sounds GU: no suprapubic tenderness Extremities: no edema, no joint deformities Skin: no rashes Neurological: Weakness, confusion  IMPRESSION: I met with patient and her significant other.  Per chart, patient has been somewhat lethargic at times likely due to  medication effect on CIWA protocol.  She is currently alert but confused.  She asked me repeatedly to reintroduce myself.  Patient is pending psych eval to determine capacity.  Unclear if her cognitive deficits are acute or chronic in the setting of long history of polysubstance abuse.  Significant other reports that her alcohol use had declined significantly over the past several weeks, which he attributes to a change in her physical ability to drink not so much a willingness for sobriety.  Patient also has significant history of polysubstance abuse with crack cocaine and pills.  She reportedly has tried detox and inpatient rehab  at least 18 times in the past.  Patient's significant other seems to have a reasonable understanding of the current work-up.  He recalls speaking with Dr. Tasia Catchings regarding possible options for chemoradiation and verbalized an understanding of the inherent risk associated with her cancer.  He does seem interested in pursuing outpatient treatment if options are available.  Her significant other is hoping to establish ACP documents when she is cognitively able.  He hopes her aunt will act as healthcare power of attorney.  We discussed CODE STATUS.  Her significant other says that they have discussed this and she would want an attempt at resuscitation but not to be prolonged artificially on machines if care were deemed futile.  Patient endorses significant neck pain.  She is receiving frequent dosing of hydromorphone, which helps but is short-lived.  We will liberalize the frequency of as needed hydromorphone.  Can consider starting her on a long-acting such as transdermal fentanyl if necessary.  PLAN: -Continue current scope of treatment -Liberalize hydromorphone -Psych consult pending -Full code  Case and plan discussed with Dr. Tasia Catchings  Time Total: 60 minutes  Visit consisted of counseling and education dealing with the complex and emotionally intense issues of symptom  management and palliative care in the setting of serious and potentially life-threatening illness.Greater than 50%  of this time was spent counseling and coordinating care related to the above assessment and plan.  Signed by: Altha Harm, PhD, NP-C

## 2021-08-01 NOTE — Progress Notes (Signed)
PROGRESS NOTE    [name]  NUU:[id number] DOB: [birth year] DOA: 07/31/2021 PCP: Venita Lick, NP   Brief Narrative:    HPI: [name] is a 55 y.o. female with medical history significant of chronic pancreatitis, hypothyroidism, thyroidectomy over 30 years ago, alcohol abuse, tobacco abuse, bipolar disorder, hepatitis C, nicotine dependence, COPD, neck mass with newly diagnosed esophageal squamous cell carcinoma, who presents with hemoptysis.   Patient was recently hospitalized from 11/13-11/15. Due to a large neck mass, pt had biopsy by IR, which showed squamous cell carcinoma of the esophagus.  Patient also had aspiration pneumonia, and treated with Rocephin and Flagyl, but the patient left hospital AMA. Per her husband, pt continues to cough with dark red bloody material coming out, patient thinks that the blood is from coughing. She also has shortness of breath, no fever or chills.  She complains of pain in neck, throat, right upper chest and right shoulder area.  Per husband, patient has not been able to eat in the past several days.  She does not have nausea, vomiting, diarrhea or abdominal pain.  No symptoms of UTI.   ED Course: pt was found to have WBC 4.1, INR 1.1, ammonia level 17, negative COVID PCR, alcohol level less than 10, abnormal liver function (ALP 50, AST 77, ALT 26, total bilirubin 1.3), electrolytes renal function okay, magnesium 2.1, temperature normal, blood pressure 157/88, heart rate 82, RR 18, oxygen saturation 98% on room air.  Patient is placed on MedSurg bed for observation, Dr. Tasia Catchings of oncology is consulted.  Assessment & Plan:   Principal Problem:   Hemoptysis Active Problems:   Chronic pancreatitis (HCC)   Hypothyroidism   Hypertension   Nicotine dependence   Bipolar 2 disorder (HCC)   Aspiration pneumonia (HCC)   Transaminitis   Dysphagia   Alcohol abuse   Squamous cell carcinoma of esophagus (HCC)   GERD (gastroesophageal reflux disease)    Macrocytic anemia   Protein-calorie malnutrition, moderate (HCC)   Acute metabolic encephalopathy  Hemoptysis vs. hematemesis/squamous cell carcinoma of esophagus: No further episodes of hematemesis or hemoptysis since admission.  Hemoglobin remained stable.  Continue Protonix IV twice daily.  Monitor hemoglobin daily.  Oncology on board.  Defer to them for definitive therapy of her cancer.  Per oncology request, I consulted vascular surgery.  They have opined that they have reviewed all the images, discussed with the patient and there is no current need of embolization.  MRI brain ordered for staging.   Dysphagia due to squamous cell carcinoma of esophagus: CEA 8.1 General surgery on board.  Plan for G-tube placement tomorrow.  Hypokalemia: 3.2.  Will replace.   Aspiration pneumonia: Chest x-ray negative, no fever and no leukocytosis this admission however during recent aspiration, she was being treated for aspiration pneumonia, she had received only 2 days of antibiotics and she left AGAINST MEDICAL ADVICE.  We will continue antibiotics for total of 5 days during this hospitalization.   Hypothyroidism:  pt is supposed to take Synthroid at 150 mcg daily, but did not take it this medication for several months.  TSH elevated to 287 on 07/30/2021 with free T4 < 0.25.  Although patient has slow in response but patient's temperature normal, heart rate 82, oriented x 3, does not seem to have myxedema.  She has been started on Synthroid 100 mcg IV daily which I will continue.  Chronic pancreatitis ([place]): -No acute issues.   Hypertension: not taking medications currently.  On as  needed hydralazine, blood pressure controlled.   Nicotine dependence and alcohol abuse -Nicotine patch -CiWA protocol.  No signs of alcohol withdrawal at this point in time.  Per her significant other, her last drink was 3 days ago.   Elevated LFTs: ALT AST elevated, indicating alcoholic liver disease.  Monitor.   GERD  (gastroesophageal reflux disease) -on IV protonix    Macrocytic anemia: Likely secondary to chronic alcohol abuse, B12 and folate normal during recent hospitalization.  Hemoglobin stable.  Monitor.     Severe protein calorie malnutrition: BMI 16.6. General surgery on board for G-tube placement.   Bipolar disorder: Psychiatry has been consulted.   Acute metabolic encephalopathy: Patient is still drowsy, but arousable, oriented x3.  Likely secondary to severe hypothyroidism in addition to opioid medications.    DVT prophylaxis: SCDs Start: 07/31/21 1009   Code Status: Full Code  Family Communication: Patient's significant other is present at bedside. Status is: Inpatient  Remains inpatient appropriate because: Needs further investigation and management of her several medical issues.   Estimated body mass index is 16.66 kg/m as calculated from the following:   Height as of this encounter: 5\' 1"  (1.549 m).   Weight as of this encounter: 40 kg.     Nutritional Assessment: Body mass index is 16.66 kg/m.Marland Kitchen Seen by dietician.  I agree with the assessment and plan as outlined below: Nutrition Status:   Skin Assessment: I have examined the patient's skin and I agree with the wound assessment as performed by the wound care RN as outlined below:    Consultants:  General surgery IR-signed off Oncology Vascular surgery-signed off Psychiatry Palliative care  Procedures:  None  Antimicrobials:  Anti-infectives (From admission, onward)    Start     Dose/Rate Route Frequency Ordered Stop   08/01/21 2125  metroNIDAZOLE (FLAGYL) IVPB 500 mg        500 mg 100 mL/hr over 60 Minutes Intravenous Every 12 hours 08/01/21 1049 08/06/21 2129   08/01/21 0600  cefTRIAXone (ROCEPHIN) 2 g in sodium chloride 0.9 % 100 mL IVPB        2 g 200 mL/hr over 30 Minutes Intravenous Every 24 hours 07/31/21 1036 08/08/21 0559   07/31/21 1400  metroNIDAZOLE (FLAGYL) IVPB 500 mg  Status:  Discontinued         500 mg 100 mL/hr over 60 Minutes Intravenous Every 8 hours 07/31/21 1036 08/01/21 1049   07/31/21 0600  cefTRIAXone (ROCEPHIN) 1 g in sodium chloride 0.9 % 100 mL IVPB        1 g 200 mL/hr over 30 Minutes Intravenous  Once 07/31/21 0553 07/31/21 0638   07/31/21 0600  metroNIDAZOLE (FLAGYL) IVPB 500 mg        500 mg 100 mL/hr over 60 Minutes Intravenous  Once 07/31/21 0553 07/31/21 0707          Subjective: Patient seen and examined.  Significant other at the bedside.  Patient complains of pain in the neck and face requesting to give her Dilaudid more frequently than she is currently receiving.  I did advise her that at this point in time, I cannot increase the frequency since patient is already lethargic and slow in response.  I am going to discontinue Benadryl as well.  Objective: Vitals:   07/31/21 2304 07/31/21 2318 08/01/21 0411 08/01/21 0847  BP: 123/85  126/90 (!) 129/93  Pulse: (!) 110 66 79 71  Resp: 16  19 20   Temp: 98.4 F (36.9 C)  98.5  F (36.9 C) 98.3 F (36.8 C)  TempSrc: Oral  Oral Oral  SpO2: (!) 70% 100% 100% 100%  Weight:      Height:        Intake/Output Summary (Last 24 hours) at 08/01/2021 1149 Last data filed at 08/01/2021 0940 Gross per 24 hour  Intake 1893.21 ml  Output 650 ml  Net 1243.21 ml   Filed Weights   07/31/21 0532  Weight: 40 kg    Examination:  General exam: Appears comfortable but very cachectic and lethargic. Respiratory system: Clear to auscultation. Respiratory effort normal. Cardiovascular system: S1 & S2 heard, RRR. No JVD, murmurs, rubs, gallops or clicks. No pedal edema. Gastrointestinal system: Abdomen is nondistended, soft and nontender. No organomegaly or masses felt. Normal bowel sounds heard. Central nervous system: Slightly lethargic but oriented.  No focal neurological deficits. Extremities: Symmetric 5 x 5 power. Skin: No rashes, lesions or ulcers   Data Reviewed: I have personally reviewed following labs  and imaging studies  CBC: Recent Labs  Lab 07/27/21 0144 07/29/21 0629 07/30/21 0451 07/31/21 0542 07/31/21 1435 07/31/21 2327 08/01/21 0519  WBC 7.7   < > 4.5 4.1 4.5 4.9 4.4  NEUTROABS 6.7  --   --  2.8  --   --   --   HGB 11.9*   < > 9.4* 11.0* 10.2* 8.8* 9.1*  HCT 36.3   < > 29.0* 33.2* 31.6* 27.8* 28.9*  MCV 104.3*   < > 103.9* 104.1* 105.0* 105.7* 106.6*  PLT 407*   < > 292 338 330 309 285   < > = values in this interval not displayed.   Basic Metabolic Panel: Recent Labs  Lab 07/27/21 0144 07/28/21 0144 07/29/21 0629 07/30/21 0451 07/31/21 0542 08/01/21 0514  NA 136  --  135 136 134* 136  K 2.9*  --  3.6 3.9 3.8 3.2*  CL 102  --  107 110 105 105  CO2 24  --  22 23 23 22   GLUCOSE 67*  --  151* 139* 86 100*  BUN 18  --  13 7 <5* <5*  CREATININE 1.00  --  0.81 0.66 0.81 0.95  CALCIUM 9.3  --  8.4* 8.2* 8.9 7.9*  MG  --  2.6*  --   --  2.1  --    GFR: Estimated Creatinine Clearance: 41.8 mL/min (by C-G formula based on SCr of 0.95 mg/dL). Liver Function Tests: Recent Labs  Lab 07/27/21 0144 07/30/21 0451 07/31/21 0542  AST 151* 71* 77*  ALT 41 26 26  ALKPHOS 53 42 50  BILITOT 1.4* 0.9 1.3*  PROT 10.0* 7.6 8.5*  ALBUMIN 4.8 3.5 3.9   Recent Labs  Lab 07/27/21 0144  LIPASE 26   Recent Labs  Lab 07/31/21 0617  AMMONIA 17   Coagulation Profile: Recent Labs  Lab 07/29/21 0613 07/31/21 0542  INR 1.1 1.1   Cardiac Enzymes: No results for input(s): CKTOTAL, CKMB, CKMBINDEX, TROPONINI in the last 168 hours. BNP (last 3 results) No results for input(s): PROBNP in the last 8760 hours. HbA1C: No results for input(s): HGBA1C in the last 72 hours. CBG: Recent Labs  Lab 07/31/21 2200 07/31/21 2350 08/01/21 0028 08/01/21 0500 08/01/21 0845  GLUCAP 70 63* 85 95 79   Lipid Profile: No results for input(s): CHOL, HDL, LDLCALC, TRIG, CHOLHDL, LDLDIRECT in the last 72 hours. Thyroid Function Tests: Recent Labs    07/30/21 0451  TSH 287.950*   FREET4 <0.25*   Anemia Panel:  Recent Labs    07/30/21 0451  VITAMINB12 891  FOLATE 13.9  RETICCTPCT 1.5   Sepsis Labs: No results for input(s): PROCALCITON, LATICACIDVEN in the last 168 hours.  Recent Results (from the past 240 hour(s))  Resp Panel by RT-PCR (Flu A&B, Covid) Nasopharyngeal Swab     Status: None   Collection Time: 07/28/21 10:07 AM   Specimen: Nasopharyngeal Swab; Nasopharyngeal(NP) swabs in vial transport medium  Result Value Ref Range Status   SARS Coronavirus 2 by RT PCR NEGATIVE NEGATIVE Final    Comment: (NOTE) SARS-CoV-2 target nucleic acids are NOT DETECTED.  The SARS-CoV-2 RNA is generally detectable in upper respiratory specimens during the acute phase of infection. The lowest concentration of SARS-CoV-2 viral copies this assay can detect is 138 copies/mL. A negative result does not preclude SARS-Cov-2 infection and should not be used as the sole basis for treatment or other patient management decisions. A negative result may occur with  improper specimen collection/handling, submission of specimen other than nasopharyngeal swab, presence of viral mutation(s) within the areas targeted by this assay, and inadequate number of viral copies(<138 copies/mL). A negative result must be combined with clinical observations, patient history, and epidemiological information. The expected result is Negative.  Fact Sheet for Patients:  EntrepreneurPulse.com.au  Fact Sheet for Healthcare Providers:  IncredibleEmployment.be  This test is no t yet approved or cleared by the [place] FDA and  has been authorized for detection and/or diagnosis of SARS-CoV-2 by FDA under an Emergency Use Authorization (EUA). This EUA will remain  in effect (meaning this test can be used) for the duration of the COVID-19 declaration under Section 564(b)(1) of the Act, 21 U.S.C.section 360bbb-3(b)(1), unless the authorization is terminated   or revoked sooner.       Influenza A by PCR NEGATIVE NEGATIVE Final   Influenza B by PCR NEGATIVE NEGATIVE Final    Comment: (NOTE) The Xpert Xpress SARS-CoV-2/FLU/RSV plus assay is intended as an aid in the diagnosis of influenza from Nasopharyngeal swab specimens and should not be used as a sole basis for treatment. Nasal washings and aspirates are unacceptable for Xpert Xpress SARS-CoV-2/FLU/RSV testing.  Fact Sheet for Patients: EntrepreneurPulse.com.au  Fact Sheet for Healthcare Providers: IncredibleEmployment.be  This test is not yet approved or cleared by the [place] FDA and has been authorized for detection and/or diagnosis of SARS-CoV-2 by FDA under an Emergency Use Authorization (EUA). This EUA will remain in effect (meaning this test can be used) for the duration of the COVID-19 declaration under Section 564(b)(1) of the Act, 21 U.S.C. section 360bbb-3(b)(1), unless the authorization is terminated or revoked.  Performed at [facility], [place], [place] [address]   Expectorated Sputum Assessment w Gram Stain, Rflx to Resp Cult     Status: None   Collection Time: 07/31/21  4:15 AM   Specimen: Expectorated Sputum  Result Value Ref Range Status   Specimen Description EXPECTORATED SPUTUM  Final   Special Requests NONE  Final   Sputum evaluation   Final    Sputum specimen not acceptable for testing.  Please recollect.   Lonn Georgia FREEMIRE  08/01/21 @ 0545 BY SB Performed at [facility], [address] [place],  [address]    Report Status 08/01/2021 FINAL  Final  Resp Panel by RT-PCR (Flu A&B, Covid) Nasopharyngeal Swab     Status: None   Collection Time: 07/31/21  5:42 AM   Specimen: Nasopharyngeal Swab; Nasopharyngeal(NP) swabs in vial transport medium  Result Value Ref Range Status   SARS Coronavirus 2 by RT PCR NEGATIVE NEGATIVE Final    Comment: (NOTE) SARS-CoV-2 target nucleic  acids are NOT DETECTED.  The SARS-CoV-2 RNA is generally detectable in upper respiratory specimens during the acute phase of infection. The lowest concentration of SARS-CoV-2 viral copies this assay can detect is 138 copies/mL. A negative result does not preclude SARS-Cov-2 infection and should not be used as the sole basis for treatment or other patient management decisions. A negative result may occur with  improper specimen collection/handling, submission of specimen other than nasopharyngeal swab, presence of viral mutation(s) within the areas targeted by this assay, and inadequate number of viral copies(<138 copies/mL). A negative result must be combined with clinical observations, patient history, and epidemiological information. The expected result is Negative.  Fact Sheet for Patients:  EntrepreneurPulse.com.au  Fact Sheet for Healthcare Providers:  IncredibleEmployment.be  This test is no t yet approved or cleared by the [place] FDA and  has been authorized for detection and/or diagnosis of SARS-CoV-2 by FDA under an Emergency Use Authorization (EUA). This EUA will remain  in effect (meaning this test can be used) for the duration of the COVID-19 declaration under Section 564(b)(1) of the Act, 21 U.S.C.section 360bbb-3(b)(1), unless the authorization is terminated  or revoked sooner.       Influenza A by PCR NEGATIVE NEGATIVE Final   Influenza B by PCR NEGATIVE NEGATIVE Final    Comment: (NOTE) The Xpert Xpress SARS-CoV-2/FLU/RSV plus assay is intended as an aid in the diagnosis of influenza from Nasopharyngeal swab specimens and should not be used as a sole basis for treatment. Nasal washings and aspirates are unacceptable for Xpert Xpress SARS-CoV-2/FLU/RSV testing.  Fact Sheet for Patients: EntrepreneurPulse.com.au  Fact Sheet for Healthcare Providers: IncredibleEmployment.be  This  test is not yet approved or cleared by the [place] FDA and has been authorized for detection and/or diagnosis of SARS-CoV-2 by FDA under an Emergency Use Authorization (EUA). This EUA will remain in effect (meaning this test can be used) for the duration of the COVID-19 declaration under Section 564(b)(1) of the Act, 21 U.S.C. section 360bbb-3(b)(1), unless the authorization is terminated or revoked.  Performed at [facility], [address] [place], [place] [address]       Radiology Studies: CT HEAD WO CONTRAST (5MM)  Result Date: 08/01/2021 CLINICAL DATA:  Delirium.  Esophageal cancer EXAM: CT HEAD WITHOUT CONTRAST TECHNIQUE: Contiguous axial images were obtained from the base of the skull through the vertex without intravenous contrast. COMPARISON:  None. FINDINGS: Brain: No evidence of acute infarction, hemorrhage, hydrocephalus, extra-axial collection or mass lesion/mass effect. Vascular: Negative for hyperdense vessel Skull: Negative Sinuses/Orbits: Negative Other: None IMPRESSION: Negative CT head Electronically Signed   By: Franchot Gallo M.D.   On: 08/01/2021 09:29   DG Chest Portable 1 View  Result Date: 07/31/2021 CLINICAL DATA:  54 year old female with cancer at the thoracic inlet, coughing and vomiting blood. Aspiration. EXAM: PORTABLE CHEST 1 VIEW COMPARISON:  Chest CTA 07/28/2021 and earlier. FINDINGS: Portable AP upright view at 0540 hours. Lung volumes remain normal. Heart size and central mediastinal contours remain within normal limits. Vague increased right paratracheal density corresponds to indistinct right thoracic inlet soft tissue mass seen by CT. Mild leftward deviation of the trachea but no significant tracheal narrowing. Patchy, spiculated mid right lung opacity as seen recently by CT. But otherwise allowing for portable technique the lungs are clear. No acute osseous abnormality identified. Negative visible  bowel gas. IMPRESSION: 1. Patchy right  mid lung opacity stable from the recent CT. 2. Vague increased right paratracheal density corresponding to large right thoracic inlet mass. 3. No new cardiopulmonary abnormality. Electronically Signed   By: Genevie Ann M.D.   On: 07/31/2021 05:52    Scheduled Meds:  glucagon (human recombinant)  0.5 mg Intramuscular STAT   levothyroxine  100 mcg Intravenous Daily   LORazepam  0-4 mg Intravenous Q6H   [START ON 08/02/2021] LORazepam  0-4 mg Intravenous Q12H   Or   [START ON 08/02/2021] LORazepam  0-4 mg Oral Q12H   nicotine  21 mg Transdermal Daily   pantoprazole (PROTONIX) IV  40 mg Intravenous Q12H   thiamine  100 mg Intravenous Daily   Continuous Infusions:  cefTRIAXone (ROCEPHIN)  IV Stopped (08/01/21 0552)   dextrose 5 % and 0.9% NaCl 100 mL/hr at 08/01/21 3419   metronidazole     potassium chloride 10 mEq (08/01/21 1117)     LOS: 1 day   Time spent: 38 minutes   Darliss Cheney, MD [facility]  08/01/2021, 11:49 AM  Please page via [facility] and do not message via secure chat for anything urgent. Secure chat can be used for anything non urgent.  How to contact the [facility] Attending or Consulting provider [place] or covering provider during after hours [place], for this patient?  Check the care team in [facility] and look for a) attending/consulting [facility] provider listed and b) the [facility] team listed. Page or secure chat [phone]. Log into [email or web address] and use [place] universal password to access. If you do not have the password, please contact the hospital operator. Locate the [facility] provider you are looking for under [facility] and page to a number that you can be directly reached. If you still have difficulty reaching the provider, please page the [facility] (Director on Call) for the Hospitalists listed on amion for assistance.

## 2021-08-01 NOTE — Progress Notes (Signed)
   08/01/21 1335  Clinical Encounter Type  Visited With Patient and family together  Visit Type Initial  Referral From Nurse  Consult/Referral To Chaplain  Spiritual Encounters  Spiritual Needs Other (Comment) (AD)  Chaplain Burris responded to request to facilitate completion, notarization of advanced directive. Pt is quite nauseous, uncertain of her capacity. Daryel November engaged staff member to serve as Patent examiner and also involved Chaplain Ormond to assist in f/u.

## 2021-08-01 NOTE — Progress Notes (Signed)
[facility] SURGICAL PROGRESS NOTE (cpt [phone])  Hospital Day(s): 1.   Post op day(s): 1 Day Post-Op.   Interval History: Patient seen and examined, no acute events or new complaints overnight. Patient reports she is very anxious and asking about her pain medications and when she can eat. She remains without leukocytosis; WBC 4.4K. Hgb is stable at 9.1. Renal function stable with sCr - 0.95; UO - 400 ccs. Mild hypokalemia to 3.2. She has remained NPO. She is on rocephin and flagyl for aspiration PNA. Unfortunately, gastrostomy tube placement could not be completed yesterday secondary to patient's mental status and lack of family present to obtain verbal consent. We will plan for this tomorrow (11/18).   Review of Systems:  Constitutional: denies fever, chills  HEENT: + cough, + dysphagia  Respiratory: denies any shortness of breath  Cardiovascular: denies chest pain or palpitations  Gastrointestinal: denies abdominal pain, N/V Genitourinary: denies burning with urination or urinary frequency   Vital signs in last 24 hours: [min-max] current  Temp:  [98.4 F (36.9 C)-98.8 F (37.1 C)] 98.5 F (36.9 C) (11/17 0411) Pulse Rate:  [66-110] 79 (11/17 0411) Resp:  [11-19] 19 (11/17 0411) BP: (114-126)/(85-90) 126/90 (11/17 0411) SpO2:  [70 %-100 %] 100 % (11/17 0411)     Height: 5\' 1"  (154.9 cm) Weight: 40 kg BMI (Calculated): 16.67   Intake/Output last 2 shifts:  11/16 0701 - 11/17 0700 In: 1893.2 [I.V.:1593.2; IV Piggyback:300] Out: 400 [Urine:400]   Physical Exam:  Constitutional: alert, cooperative. Tearful at times. Cachetic with muscle wasting HENT: normocephalic without obvious abnormality  Eyes: PERRL, EOM's grossly intact and symmetric  Respiratory: breathing non-labored at rest  Cardiovascular: regular rate and sinus rhythm  Gastrointestinal: soft, non-tender, and non-distended, no rebound/guarding    Labs:  CBC Latest Ref Rng & Units 08/01/2021  07/31/2021 07/31/2021  WBC 4.0 - 10.5 K/uL 4.4 4.9 4.5  Hemoglobin 12.0 - 15.0 g/dL 9.1(L) 8.8(L) 10.2(L)  Hematocrit 36.0 - 46.0 % 28.9(L) 27.8(L) 31.6(L)  Platelets 150 - 400 K/uL 285 309 330   CMP Latest Ref Rng & Units 08/01/2021 07/31/2021 07/30/2021  Glucose 70 - 99 mg/dL 100(H) 86 139(H)  BUN 6 - 20 mg/dL <5(L) <5(L) 7  Creatinine 0.44 - 1.00 mg/dL 0.95 0.81 0.66  Sodium 135 - 145 mmol/L 136 134(L) 136  Potassium 3.5 - 5.1 mmol/L 3.2(L) 3.8 3.9  Chloride 98 - 111 mmol/L 105 105 110  CO2 22 - 32 mmol/L 22 23 23   Calcium 8.9 - 10.3 mg/dL 7.9(L) 8.9 8.2(L)  Total Protein 6.5 - 8.1 g/dL - 8.5(H) 7.6  Total Bilirubin 0.3 - 1.2 mg/dL - 1.3(H) 0.9  Alkaline Phos 38 - 126 U/L - 50 42  AST 15 - 41 U/L - 77(H) 71(H)  ALT 0 - 44 U/L - 26 26    Imaging studies: No new pertinent imaging studies   Assessment/Plan: (ICD-10's: K59.89) 55 y.o. female with obstructing squamous cell carcinoma of the esophagus    - Unfortunately gastrostomy tube placement could not be completed yesterday secondary to patient's mental status and ultimately lack of family present to obtain adequate consent. As such, we will plan on robotic assisted laparoscopic gastrostomy tube placement tomorrow (11/18) with Dr Christian Mate  - All risks, benefits, and alternatives to above procedure(s) were discussed with the patient and her husband at bedside, all of her questions were answered to her expressed satisfaction, patient expresses she wishes to proceed, and informed consent was obtained.  - I am  okay with her trying very small sips of clear liquids and being very meticulous for comfort; she is adamant about this. Low threshold for NPO. She needs to be NPO at midnight  - Pain control prn  - Agree with palliative care discussion  - Appreciate oncology input; agree with feeding tube placement, recommending MRI Brain for staging completion, psychiatric evaluation for competency, and vascular surgery evaluation as this mass  may be encasing large vessels  - Further management per primary service; we will follow    All of the above findings and recommendations were discussed with the patient, patient's family (husband at bedside), and the medical team, and all of patient's and family's questions were answered to her expressed satisfaction.  -- Edison Simon, PA-C [facility] 08/01/2021, 7:33 AM [phone] M-F: 7am - 4pm

## 2021-08-01 NOTE — Progress Notes (Signed)
   08/01/21 1400  Clinical Encounter Type  Visited With Patient and family together  Visit Type Follow-up  Referral From Nurse  Consult/Referral To Chaplain  Spiritual Encounters  Spiritual Needs Literature;Brochure  Advance Directives (For Healthcare)  Does Patient Have a Medical Advance Directive? Yes  Does patient want to make changes to medical advance directive? No - Patient declined  Type of Advance Directive Healthcare Power of Attorney  Chaplain [place] and [place] completed one AD for Ms. [name] in room 125A.   Health Care Agent Mr. Georgeann Oppenheim [address] [place], [place] [address] [phone]

## 2021-08-01 NOTE — Progress Notes (Signed)
Initial Nutrition Assessment  DOCUMENTATION CODES:   Underweight, Severe malnutrition in context of chronic illness  INTERVENTION:   -Once g-tube is placed, recommend:  Initiate Osmolite 1.2 @ 20 ml/hr and increase by 10 ml every 8 hours to goal rate of 60 ml/hr.   105 ml free water flush every 6 hours  Tube feeding regimen provides 1728 kcal (100% of needs), 80 grams of protein, and 1181 ml of H2O. Total free water: 1601 ml daily  -Once able to demonstrate tolerance of continuous TF, recommend transition to bolus feeds:  355 ml Osmolite 1.2 via PEG 4 times daily  55 ml free water flush before and after each feeding administration  Tube feeding regimen provides 1710 kcal (100% of needs), 79 grams of protein, and 1170 ml of H2O.  Total free water: 1610 ml  -Once feedings are initiated, monitor Mg, K, and Phos daily and replete as needed due to high refeeding risk   NUTRITION DIAGNOSIS:   Severe Malnutrition related to chronic illness (esophageal cancer) as evidenced by energy intake < or equal to 75% for > or equal to 1 month, severe fat depletion, severe muscle depletion, percent weight loss.  GOAL:   Patient will meet greater than or equal to 90% of their needs  MONITOR:   Diet advancement, Labs, Weight trends, Skin, I & O's  REASON FOR ASSESSMENT:   Malnutrition Screening Tool    ASSESSMENT:   [name] is a 55 y.o. female with medical history significant of chronic pancreatitis, hypothyroidism, thyroidectomy over 30 years ago, alcohol abuse, tobacco abuse, bipolar disorder, hepatitis C, nicotine dependence, COPD, neck mass with newly diagnosed esophageal squamous cell carcinoma, who presents with hemoptysis.  Pt admitted with hemoptysis vs hematemesis and dysphagia secondary to esophageal cancer.   Reviewed I/O's: +1.5 L x 24 hours  UOP: 400 ml x 24 hours  Spoke with pt at bedside. She is very frustrated with being able to eat anything other than clear  liquids. She is requesting chocolate pie and cake. RD explained rationale for clear liquids; pt remained frustrated, but amenable to [place], which RD provided in two flavors.   Pt reports no oral intake over the past week secondary to dysphagia. Noted minimal completion of clear liquid diet.  Reviewed wt hx; pt has experienced a 20.5% wt loss over the past 11 months, which is significant for time frame.   Plan for g-tube placement tomorrow. Explained to pt that this is how she will receive her nutrition secondary to dysphagia. Pt remains frustrated, but understanding. Pt expressed appreciation for RD visit.   RD to forward note to cancer center RD for close surveillance.   Medications reviewed and include ativan, thiamine, and dextrose 5%-0.9% sodium chloride infusion @ 100 ml/hr.  Labs reviewed: K: 3.2 (on IV supplementation).     NUTRITION - FOCUSED PHYSICAL EXAM:  Flowsheet Row Most Recent Value  Orbital Region Severe depletion  Upper Arm Region Severe depletion  Thoracic and Lumbar Region Severe depletion  Buccal Region Severe depletion  Temple Region Severe depletion  Clavicle Bone Region Severe depletion  Clavicle and Acromion Bone Region Severe depletion  Scapular Bone Region Severe depletion  Dorsal Hand Severe depletion  Patellar Region Severe depletion  Anterior Thigh Region Severe depletion  Posterior Calf Region Severe depletion  Edema (RD Assessment) None  Hair Reviewed  Eyes Reviewed  Mouth Reviewed  Skin Reviewed  Nails Reviewed       Diet Order:   Diet Order  Diet NPO time specified  Diet effective midnight           Diet clear liquid Room service appropriate? Yes; Fluid consistency: Thin  Diet effective now                   EDUCATION NEEDS:   Education needs have been addressed  Skin:  Skin Assessment: Skin Integrity Issues: Skin Integrity Issues:: Incisions Incisions: closed rt throat s/p biopsy  Last BM:   Unknown  Height:   Ht Readings from Last 1 Encounters:  07/31/21 5\' 1"  (1.549 m)    Weight:   Wt Readings from Last 1 Encounters:  07/31/21 40 kg    Ideal Body Weight:  47.7 kg  BMI:  Body mass index is 16.66 kg/m.  Estimated Nutritional Needs:   Kcal:  1600-1800  Protein:  80-95 grams  Fluid:  > 1.6 L    Loistine Chance, RD, LDN, [place] Registered Dietitian II Certified Diabetes Care and Education Specialist Please refer to [facility] for RD and/or RD on-call/weekend/after hours pager

## 2021-08-01 NOTE — Consult Note (Signed)
[facility] Vascular Consult Note  MRN : [id number]  [name] is a 55 y.o. (1965/11/30) female who presents with chief complaint of  Chief Complaint  Patient presents with   Hematemesis    Coughing/vomiting blood   History of Present Illness:  [name] is a 55 year old female with medical history significant for chronic pancreatitis, alcohol abuse, bipolar disorder, hepatitis C, nicotine dependence, COPD who presents to the emergency room for evaluation of difficulty swallowing and inability to tolerate oral intake.  Patient states that she had a thyroidectomy over 30 years ago and in the last couple of months has noticed a lump in her neck which has increased in size over the last couple of months.  She and her significant other state that she has had difficulty swallowing for months initially with solids but now even with liquids.  She has had significant weight loss at least over 30 pounds in the last 2 months.   Patient states that every time she tries to eat or drink anything it comes back up.  She also complains of coughing up bloody sputum for the last couple of days but denies having any fever or chills.  She denies having shortness of breath or any chest pain. She complains of pain in her epigastrium similar to her prior episodes of pancreatitis.   She admits to continued alcohol use and her last use was 4 days prior to her admission.  She admits to having symptoms of alcohol withdrawal when she does not drink. She has also had bright red blood per rectum and admits to having hemorrhoids as well as chronic constipation.  She also complains of generalized weakness. She denies having any headache, no dizziness, no lightheadedness, no palpitations, no leg swelling, no urinary symptoms, no blurred vision.  CT scan of the neck shows nearly 7 cm mass at the thoracic inlet, favor locally advanced esophageal cancer with encasement of major vessels. The mass  approaches the skin surface and may be amenable to percutaneous biopsy. Mildly enlarged left level 4 lymph node.  Chest CTA /abdominal CT shows large mass at the thoracic inlet, reference neck CT. Extensive debris in right-sided airways with right upper lobe opacity likely reflecting pneumonia or aspiration. 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  Vascular surgery was consulted by Dr. Doristine Bosworth for possible vascular intervention in the setting of esophageal cancer with encasement of major vessels.  Current Facility-Administered Medications  Medication Dose Route Frequency Provider Last Rate Last Admin   cefTRIAXone (ROCEPHIN) 2 g in sodium chloride 0.9 % 100 mL IVPB  2 g Intravenous Q24H Ivor Costa, MD   Stopped at 08/01/21 0552   dextromethorphan-guaiFENesin ([place] DM) 30-600 MG per 12 hr tablet 1 tablet  1 tablet Oral BID PRN Ivor Costa, MD       dextrose 5 %-0.9 % sodium chloride infusion   Intravenous Continuous Ivor Costa, MD 100 mL/hr at 08/01/21 0922 New Bag at 08/01/21 0922   diphenhydrAMINE (BENADRYL) injection 25 mg  25 mg Intravenous Q8H PRN Ivor Costa, MD   25 mg at 08/01/21 0858   glucagon (human recombinant) (GLUCAGEN) injection 0.5 mg  0.5 mg Intramuscular STAT Ivor Costa, MD       hydrALAZINE (APRESOLINE) injection 5 mg  5 mg Intravenous Q2H PRN Ivor Costa, MD       HYDROmorphone (DILAUDID) injection 0.5 mg  0.5 mg Intravenous Q4H PRN Ivor Costa, MD   0.5 mg  at 08/01/21 0623   levothyroxine (SYNTHROID, LEVOTHROID) injection 100 mcg  100 mcg Intravenous Daily Ivor Costa, MD   100 mcg at 08/01/21 0905   LORazepam (ATIVAN) injection 0-4 mg  0-4 mg Intravenous Q6H Ivor Costa, MD   1 mg at 08/01/21 0520   [START ON 08/02/2021] LORazepam (ATIVAN) injection 0-4 mg  0-4 mg Intravenous Q12H Ivor Costa, MD       Or   Derrill Memo ON 08/02/2021] LORazepam (ATIVAN) tablet 0-4 mg  0-4 mg Oral Q12H Ivor Costa, MD       metroNIDAZOLE (FLAGYL) IVPB 500 mg   500 mg Intravenous Q12H Dorothe Pea, RPH       nicotine (NICODERM CQ - dosed in mg/24 hours) patch 21 mg  21 mg Transdermal Daily Ivor Costa, MD   21 mg at 08/01/21 0919   pantoprazole (PROTONIX) injection 40 mg  40 mg Intravenous Huntley Dec, MD   40 mg at 08/01/21 7628   potassium chloride 10 mEq in 100 mL IVPB  10 mEq Intravenous Q1 Hr x 5 Pahwani, Einar Grad, MD 100 mL/hr at 08/01/21 0938 10 mEq at 08/01/21 3151   thiamine (B-1) injection 100 mg  100 mg Intravenous Daily Ivor Costa, MD   100 mg at 08/01/21 0915   Past Medical History:  Diagnosis Date   Alcohol abuse    Asthma    Cancer ([place])    "carcinoma of throat" with s/p thyroidectomy   Chronic pain syndrome    Chronic pancreatitis ([facility])    COPD (chronic obstructive pulmonary disease) ([place])    Depression    Hepatitis C    History of alcohol abuse    History of drug use    Hypertension 03/18/2013   Hypothyroidism    Low back pain    Pancreatic pseudocyst 03/18/2015   Seizures ([place])    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   Past Surgical History:  Procedure Laterality Date   CESAREAN SECTION     GASTROSTOMY TUBE PLACEMENT  2011   TOTAL THYROIDECTOMY     Social History Social History   Tobacco Use   Smoking status: Every Day    Packs/day: 0.25    Types: Cigarettes   Smokeless tobacco: Never  Vaping Use   Vaping Use: Never used  Substance Use Topics   Alcohol use: Yes    Alcohol/week: 0.0 standard drinks    Comment: pt states she has a drink every day   Drug use: Yes    Types: Marijuana, Cocaine    Comment: History of drug use   Family History Family History  Problem Relation Age of Onset   Diabetes Mellitus II Maternal Grandfather    CAD Maternal Grandfather    Diabetes Mellitus II Mother    Liver cancer Mother    Arthritis Mother    Cancer Mother        lung   Cancer Father    Thyroid disease Son    Diabetes Mellitus II Maternal Aunt    Pancreatic cancer Paternal Elenor Legato   Denies family history of  peripheral artery disease, venous disease or renal disease.  Allergies  Allergen Reactions   Penicillins Rash and Other (See Comments)    Has patient had a PCN reaction causing immediate rash, facial/tongue/throat swelling, SOB or lightheadedness with hypotension: No Has patient had a PCN reaction causing severe rash involving mucus membranes or skin necrosis: No Has patient had a PCN reaction that required hospitalization No Has patient had a PCN reaction occurring  within the last 10 years: No If all of the above answers are "NO", then may proceed with Cephalosporin use.   REVIEW OF SYSTEMS (Negative unless checked)  Constitutional: [x] Weight loss  [] Fever  [] Chills Cardiac: [] Chest pain   [] Chest pressure   [] Palpitations   [] Shortness of breath when laying flat   [] Shortness of breath at rest   [] Shortness of breath with exertion. Vascular:  [] Pain in legs with walking   [] Pain in legs at rest   [] Pain in legs when laying flat   [] Claudication   [] Pain in feet when walking  [] Pain in feet at rest  [] Pain in feet when laying flat   [] History of DVT   [] Phlebitis   [] Swelling in legs   [] Varicose veins   [] Non-healing ulcers Pulmonary:   [] Uses home oxygen   [] Productive cough   [] Hemoptysis   [] Wheeze  [] COPD   [] Asthma Neurologic:  [] Dizziness  [] Blackouts   [] Seizures   [] History of stroke   [] History of TIA  [] Aphasia   [] Temporary blindness   [] Dysphagia   [] Weakness or numbness in arms   [] Weakness or numbness in legs Musculoskeletal:  [] Arthritis   [] Joint swelling   [] Joint pain   [] Low back pain Hematologic:  [] Easy bruising  [] Easy bleeding   [] Hypercoagulable state   [] Anemic  [] Hepatitis Gastrointestinal:  [] Blood in stool   [x] Vomiting blood  [] Gastroesophageal reflux/heartburn   [x] Difficulty swallowing. Genitourinary:  [] Chronic kidney disease   [] Difficult urination  [] Frequent urination  [] Burning with urination   [] Blood in urine Skin:  [] Rashes   [] Ulcers    [] Wounds Psychological:  [] History of anxiety   []  History of major depression.  Physical Examination  Vitals:   07/31/21 2304 07/31/21 2318 08/01/21 0411 08/01/21 0847  BP: 123/85  126/90 (!) 129/93  Pulse: (!) 110 66 79 71  Resp: 16  19 20   Temp: 98.4 F (36.9 C)  98.5 F (36.9 C) 98.3 F (36.8 C)  TempSrc: Oral  Oral Oral  SpO2: (!) 70% 100% 100% 100%  Weight:      Height:       Body mass index is 16.66 kg/m. Gen:  WD/WN, NAD Head: [place]/AT, No temporalis wasting. Prominent temp pulse not noted. Ear/Nose/Throat: Hearing grossly intact, nares w/o erythema or drainage, oropharynx w/o Erythema/Exudate Eyes: Sclera non-icteric, conjunctiva clear Neck: Trachea midline.  No JVD.  Pulmonary:  Good air movement, respirations not labored, equal bilaterally.  Cardiac: RRR, normal S1, S2. Vascular:  Vessel Right Left  Radial Palpable Palpable  Ulnar Palpable Palpable   Gastrointestinal: soft, non-tender/non-distended. No guarding/reflex.  Musculoskeletal: M/S 5/5 throughout.  Extremities without ischemic changes.  No deformity or atrophy. No edema. Neurologic: Sensation grossly intact in extremities.  Symmetrical.  Speech is fluent. Motor exam as listed above. Psychiatric: Judgment intact, Mood & affect appropriate for pt's clinical situation. Dermatologic: No rashes or ulcers noted.  No cellulitis or open wounds. Lymph : No Cervical, Axillary, or Inguinal lymphadenopathy.  CBC Lab Results  Component Value Date   WBC 4.4 08/01/2021   HGB 9.1 (L) 08/01/2021   HCT 28.9 (L) 08/01/2021   MCV 106.6 (H) 08/01/2021   PLT 285 08/01/2021   BMET    Component Value Date/Time   NA 136 08/01/2021 0514   NA 136 08/16/2020 1613   NA 137 12/27/2013 0847   K 3.2 (L) 08/01/2021 0514   K 3.4 (L) 12/27/2013 0847   CL 105 08/01/2021 0514   CL 104 12/27/2013 0847  CO2 22 08/01/2021 0514   CO2 27 12/27/2013 0847   GLUCOSE 100 (H) 08/01/2021 0514   GLUCOSE 90 12/27/2013 0847   BUN <5 (L)  08/01/2021 0514   BUN 14 08/16/2020 1613   BUN 13 12/27/2013 0847   CREATININE 0.95 08/01/2021 0514   CREATININE 0.66 12/27/2013 0847   CALCIUM 7.9 (L) 08/01/2021 0514   CALCIUM 8.7 12/27/2013 0847   GFRNONAA >60 08/01/2021 0514   GFRNONAA >60 12/27/2013 0847   GFRAA 77 08/16/2020 1613   GFRAA >60 12/27/2013 0847   Estimated Creatinine Clearance: 41.8 mL/min (by C-G formula based on SCr of 0.95 mg/dL).  COAG Lab Results  Component Value Date   INR 1.1 07/31/2021   INR 1.1 07/29/2021   INR 1.05 11/28/2015   Radiology CT HEAD WO CONTRAST (5MM)  Result Date: 08/01/2021 CLINICAL DATA:  Delirium.  Esophageal cancer EXAM: CT HEAD WITHOUT CONTRAST TECHNIQUE: Contiguous axial images were obtained from the base of the skull through the vertex without intravenous contrast. COMPARISON:  None. FINDINGS: Brain: No evidence of acute infarction, hemorrhage, hydrocephalus, extra-axial collection or mass lesion/mass effect. Vascular: Negative for hyperdense vessel Skull: Negative Sinuses/Orbits: Negative Other: None IMPRESSION: Negative CT head Electronically Signed   By: Franchot Gallo M.D.   On: 08/01/2021 09:29   CT Soft Tissue Neck W Contrast  Result Date: 07/28/2021 CLINICAL DATA:  Abdominal pain for 4 months, worsening. History of alcohol abuse. Neck abscess, deep tissue. EXAM: CT NECK WITH CONTRAST TECHNIQUE: Multidetector CT imaging of the neck was performed using the standard protocol following the bolus administration of intravenous contrast. CONTRAST:  90mL OMNIPAQUE IOHEXOL 300 MG/ML  SOLN COMPARISON:  None. FINDINGS: Pharynx, larynx, and esophagus: At the level of the thoracic inlet is a large mass measuring up to 6.7 cm, displacing the carotid sheath anteriorly, the trachea anteriorly and leftward, in the esophagus leftward. Margins with the right wall of the esophagus are indistinct and there is a gas channel in the region of the expected esophageal wall. History of abscess but the mass  appears more solid and necrotic appearing with limited regional fat edema and encasement of vessels including the right proximal vertebral artery, right common carotid, and proximal right subclavian. Burtis Junes an exophytic esophageal mass. Salivary glands: No inflammation, mass, or stone. Thyroid: Atrophic if not resected. Lymph nodes: 10 mm left level 4 lymph node. Vascular: Vessel encasement as above. No acute vascular finding. Atheromatous calcification. Limited intracranial: Negative Visualized orbits: Negative Mastoids and visualized paranasal sinuses: Clear Skeleton: Cervical spine degeneration Upper chest: Apical emphysema. IMPRESSION: 1. Nearly 7 cm mass at the thoracic inlet, favor locally advanced esophageal cancer with encasement of major vessels. The mass approaches the skin surface and may be amenable to percutaneous biopsy. 2. Single mildly enlarged left level 4 lymph node. Electronically Signed   By: Jorje Guild M.D.   On: 07/28/2021 05:48   CT Angio Chest PE W and/or Wo Contrast  Addendum Date: 07/28/2021   ADDENDUM REPORT: 07/28/2021 06:35 ADDENDUM: Omitted impression #3 of ascending aortic aneurysm (measuring 4.1 cm in diameter) significance to be determined by workup of the patient's neck mass. Electronically Signed   By: Jorje Guild M.D.   On: 07/28/2021 06:35   Result Date: 07/28/2021 CLINICAL DATA:  Abdominal distension in the left upper quadrant. Abdominal pain. EXAM: CT ANGIOGRAPHY CHEST CT ABDOMEN AND PELVIS WITH CONTRAST TECHNIQUE: Multidetector CT imaging of the chest was performed using the standard protocol during bolus administration of intravenous contrast. Multiplanar CT image  reconstructions and MIPs were obtained to evaluate the vascular anatomy. Multidetector CT imaging of the abdomen and pelvis was performed using the standard protocol during bolus administration of intravenous contrast. CONTRAST:  89mL OMNIPAQUE IOHEXOL 300 MG/ML  SOLN COMPARISON:  Abdominal CT  11/28/2015 FINDINGS: CTA CHEST FINDINGS Cardiovascular: Cardiomegaly. Small to moderate low-density pericardial effusion measuring up to 11 mm in thickness. Aneurysmal ascending aorta measuring up to 4.1 cm in diameter. Mediastinum/Nodes: Mass at the thoracic inlet and upper mediastinum as described on dedicated neck CT no lymphadenopathy seen in the mediastinum. Lungs/Pleura: Airway thickening with debris in the right sided airways there is associated with airspace opacity in the inferior and posterior right upper lobe. Mild atelectasis in the medial right middle lobe and lingula. Musculoskeletal: Scoliosis. Review of the MIP images confirms the above findings. CT ABDOMEN and PELVIS FINDINGS Hepatobiliary: No focal liver abnormality.Prominence of intrahepatic bile ducts. No calcified cholelithiasis. Pancreas: Unremarkable. Spleen: Unremarkable. Adrenals/Urinary Tract: Negative adrenals. No hydronephrosis or stone. Unremarkable bladder. Stomach/Bowel:  No obstruction. No appendicitis. 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 There is also chronic occlusion of the distal splenic vein. Numerous upper abdominal collaterals. Atheromatous calcification of the aorta and iliacs. No mass or adenopathy. Reproductive:No pathologic findings. Other: No ascites or pneumoperitoneum. Musculoskeletal: No acute abnormalities. Review of the MIP images confirms the above findings. IMPRESSION: Chest CTA: 1. Large mass at the thoracic inlet, reference neck CT. 2. Extensive debris in right-sided airways with right upper lobe opacity likely reflecting pneumonia or aspiration. Abdominal CT: 1. No acute finding. 2. 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 Electronically Signed: By: Jorje Guild M.D. On: 07/28/2021 05:56   CT ABDOMEN PELVIS W CONTRAST  Addendum Date: 07/28/2021   ADDENDUM REPORT: 07/28/2021 06:35 ADDENDUM: Omitted impression #3 of  ascending aortic aneurysm (measuring 4.1 cm in diameter) significance to be determined by workup of the patient's neck mass. Electronically Signed   By: Jorje Guild M.D.   On: 07/28/2021 06:35   Result Date: 07/28/2021 CLINICAL DATA:  Abdominal distension in the left upper quadrant. Abdominal pain. EXAM: CT ANGIOGRAPHY CHEST CT ABDOMEN AND PELVIS WITH CONTRAST TECHNIQUE: Multidetector CT imaging of the chest was performed using the standard protocol during bolus administration of intravenous contrast. Multiplanar CT image reconstructions and MIPs were obtained to evaluate the vascular anatomy. Multidetector CT imaging of the abdomen and pelvis was performed using the standard protocol during bolus administration of intravenous contrast. CONTRAST:  23mL OMNIPAQUE IOHEXOL 300 MG/ML  SOLN COMPARISON:  Abdominal CT 11/28/2015 FINDINGS: CTA CHEST FINDINGS Cardiovascular: Cardiomegaly. Small to moderate low-density pericardial effusion measuring up to 11 mm in thickness. Aneurysmal ascending aorta measuring up to 4.1 cm in diameter. Mediastinum/Nodes: Mass at the thoracic inlet and upper mediastinum as described on dedicated neck CT no lymphadenopathy seen in the mediastinum. Lungs/Pleura: Airway thickening with debris in the right sided airways there is associated with airspace opacity in the inferior and posterior right upper lobe. Mild atelectasis in the medial right middle lobe and lingula. Musculoskeletal: Scoliosis. Review of the MIP images confirms the above findings. CT ABDOMEN and PELVIS FINDINGS Hepatobiliary: No focal liver abnormality.Prominence of intrahepatic bile ducts. No calcified cholelithiasis. Pancreas: Unremarkable. Spleen: Unremarkable. Adrenals/Urinary Tract: Negative adrenals. No hydronephrosis or stone. Unremarkable bladder. Stomach/Bowel:  No obstruction. No appendicitis. 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 There is also chronic occlusion of the  distal splenic vein. Numerous upper abdominal collaterals. Atheromatous calcification of the aorta and iliacs.  No mass or adenopathy. Reproductive:No pathologic findings. Other: No ascites or pneumoperitoneum. Musculoskeletal: No acute abnormalities. Review of the MIP images confirms the above findings. IMPRESSION: Chest CTA: 1. Large mass at the thoracic inlet, reference neck CT. 2. Extensive debris in right-sided airways with right upper lobe opacity likely reflecting pneumonia or aspiration. Abdominal CT: 1. No acute finding. 2. 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 Electronically Signed: By: Jorje Guild M.D. On: 07/28/2021 05:56   DG Chest Portable 1 View  Result Date: 07/31/2021 CLINICAL DATA:  55 year old female with cancer at the thoracic inlet, coughing and vomiting blood. Aspiration. EXAM: PORTABLE CHEST 1 VIEW COMPARISON:  Chest CTA 07/28/2021 and earlier. FINDINGS: Portable AP upright view at 0540 hours. Lung volumes remain normal. Heart size and central mediastinal contours remain within normal limits. Vague increased right paratracheal density corresponds to indistinct right thoracic inlet soft tissue mass seen by CT. Mild leftward deviation of the trachea but no significant tracheal narrowing. Patchy, spiculated mid right lung opacity as seen recently by CT. But otherwise allowing for portable technique the lungs are clear. No acute osseous abnormality identified. Negative visible bowel gas. IMPRESSION: 1. Patchy right mid lung opacity stable from the recent CT. 2. Vague increased right paratracheal density corresponding to large right thoracic inlet mass. 3. No new cardiopulmonary abnormality. Electronically Signed   By: Genevie Ann M.D.   On: 07/31/2021 05:52   [place] CORE BIOPSY (SOFT TISSUE)  Result Date: 07/29/2021 INDICATION: Large neck mass at the thoracic inlet favored to be advanced esophageal cancer, dysphagia EXAM: ULTRASOUND CORE BIOPSY  LARGE RIGHT NECK MASS MEDICATIONS: 1% LIDOCAINE LOCAL ANESTHESIA/SEDATION: None. Total intra-service moderate Sedation Time: 0 minutes. The patient's level of consciousness and vital signs were monitored continuously by radiology nursing throughout the procedure under my direct supervision. FLUOROSCOPY TIME:  Fluoroscopy Time: None. COMPLICATIONS: None immediate. PROCEDURE: Informed written consent was obtained from the patient after a thorough discussion of the procedural risks, benefits and alternatives. All questions were addressed. Maximal Sterile Barrier Technique was utilized including caps, mask, sterile gowns, sterile gloves, sterile drape, hand hygiene and skin antiseptic. A timeout was performed prior to the initiation of the procedure. Previous imaging reviewed. The large predominately right neck mass was localized and marked just medial to the right carotid artery. Under sterile conditions and local anesthesia, an 18 gauge core biopsy needle was advanced into the right neck mass. Needle position confirmed with ultrasound. Images obtained for documentation. 3 18 gauge core biopsies obtained. Samples were intact and non fragmented. These were placed in formalin. Needle removed. Postprocedure imaging demonstrates no hemorrhage or hematoma. Patient tolerated the biopsy well. IMPRESSION: Successful ultrasound right neck mass 18 gauge core biopsies. Electronically Signed   By: Jerilynn Mages.  Shick M.D.   On: 07/29/2021 13:34    Assessment/Plan [name] is a 55 year old female with medical history significant for chronic pancreatitis, alcohol abuse, bipolar disorder, hepatitis C, nicotine dependence, COPD who presents to the emergency room for evaluation of difficulty swallowing and inability to tolerate oral intake found to have large esophageal mass with encasement of vessels.  1.  Esophageal Cancer: Patient presents with progressively worsening dysphagia found to have large esophageal mass with encasement of  vessels.  Vascular surgery was consulted for possible embolization of these vessels.  Reviewed images with Dr. Lucky Cowboy. Her tumor is encasing her great vessels and not a small branch that can be prophylactically embolized.  Embolization of would result in the carotid  or subclavian arteries without major morbidity.  If she were to have an active bleed, we may be able to place covered stents in the area that is bleeding, but this is a very difficult situation.  At this time, no vascular intervention is indicated.  We will continue to follow along with you.  2.  Alcohol Abuse: Patient notes withdrawal symptoms when she does not drink. On the CIWA protocol  3.  Acute Metabolic Encephalopathy: Pending MRI of the brain Possible cognitive impairment due to chronic alcohol abuse?  Discussed with Dr. Mayme Genta, PA-C 08/01/2021 10:50 AM  This note was created with Dragon medical transcription system.  Any error is purely unintentional.

## 2021-08-01 NOTE — Progress Notes (Signed)
   08/01/21 1400  Clinical Encounter Type  Visited With Patient and family together  Visit Type Follow-up  Referral From Nurse  Consult/Referral To Chaplain  Spiritual Encounters  Spiritual Needs Literature;Brochure  Advance Directives (For Healthcare)  Does Patient Have a Medical Advance Directive? Yes  Type of Advance Directive Healthcare Power of [name] is [name]

## 2021-08-02 ENCOUNTER — Encounter
Admission: EM | Discharge status: Against Medical Advice | Payer: Self-pay | Source: Home / Self Care | Attending: Internal Medicine

## 2021-08-02 ENCOUNTER — Encounter: Payer: Self-pay | Admitting: Anesthesiology

## 2021-08-02 ENCOUNTER — Encounter: Payer: Self-pay | Admitting: Internal Medicine

## 2021-08-02 ENCOUNTER — Inpatient Hospital Stay: Payer: Medicaid Other

## 2021-08-02 DIAGNOSIS — G893 Neoplasm related pain (acute) (chronic): Secondary | ICD-10-CM | POA: Diagnosis present

## 2021-08-02 DIAGNOSIS — Z7189 Other specified counseling: Secondary | ICD-10-CM

## 2021-08-02 DIAGNOSIS — K2289 Other specified disease of esophagus: Secondary | ICD-10-CM

## 2021-08-02 DIAGNOSIS — F1310 Sedative, hypnotic or anxiolytic abuse, uncomplicated: Secondary | ICD-10-CM | POA: Diagnosis present

## 2021-08-02 DIAGNOSIS — F14120 Cocaine abuse with intoxication, uncomplicated: Secondary | ICD-10-CM

## 2021-08-02 DIAGNOSIS — F1410 Cocaine abuse, uncomplicated: Secondary | ICD-10-CM | POA: Diagnosis present

## 2021-08-02 DIAGNOSIS — F1020 Alcohol dependence, uncomplicated: Secondary | ICD-10-CM

## 2021-08-02 LAB — URINE DRUG SCREEN, QUALITATIVE (ARMC ONLY)
Amphetamines, Ur Screen: NOT DETECTED
Barbiturates, Ur Screen: NOT DETECTED
Benzodiazepine, Ur Scrn: POSITIVE — AB
Cannabinoid 50 Ng, Ur ~~LOC~~: POSITIVE — AB
Cocaine Metabolite,Ur ~~LOC~~: POSITIVE — AB
MDMA (Ecstasy)Ur Screen: NOT DETECTED
Methadone Scn, Ur: NOT DETECTED
Opiate, Ur Screen: POSITIVE — AB
Phencyclidine (PCP) Ur S: NOT DETECTED
Tricyclic, Ur Screen: NOT DETECTED

## 2021-08-02 LAB — GLUCOSE, CAPILLARY
Glucose-Capillary: 110 mg/dL — ABNORMAL HIGH (ref 70–99)
Glucose-Capillary: 79 mg/dL (ref 70–99)
Glucose-Capillary: 82 mg/dL (ref 70–99)
Glucose-Capillary: 92 mg/dL (ref 70–99)
Glucose-Capillary: 95 mg/dL (ref 70–99)

## 2021-08-02 LAB — BASIC METABOLIC PANEL
Anion gap: 8 (ref 5–15)
BUN: <5 mg/dL — ABNORMAL LOW (ref 6–20)
CO2: 20 mmol/L — ABNORMAL LOW (ref 22–32)
Calcium: 8 mg/dL — ABNORMAL LOW (ref 8.9–10.3)
Chloride: 109 mmol/L (ref 98–111)
Creatinine, Ser: 0.87 mg/dL (ref 0.44–1.00)
GFR, Estimated: >60 mL/min (ref >60)
Glucose, Bld: 105 mg/dL — ABNORMAL HIGH (ref 70–99)
Potassium: 3.4 mmol/L — ABNORMAL LOW (ref 3.5–5.1)
Sodium: 137 mmol/L (ref 135–145)

## 2021-08-02 LAB — CBC
HCT: 28.7 % — ABNORMAL LOW (ref 36.0–46.0)
Hemoglobin: 8.9 g/dL — ABNORMAL LOW (ref 12.0–15.0)
MCH: 33.3 pg (ref 26.0–34.0)
MCHC: 31 g/dL (ref 30.0–36.0)
MCV: 107.5 fL — ABNORMAL HIGH (ref 80.0–100.0)
Platelets: 262 10*3/uL (ref 150–400)
RBC: 2.67 MIL/uL — ABNORMAL LOW (ref 3.87–5.11)
RDW: 13.1 % (ref 11.5–15.5)
WBC: 5.2 10*3/uL (ref 4.0–10.5)
nRBC: 0 % (ref 0.0–0.2)

## 2021-08-02 SURGERY — INSERTION, GASTROSTOMY TUBE, ROBOT-ASSISTED
Anesthesia: General

## 2021-08-02 MED ORDER — GADOBUTROL 1 MMOL/ML IV SOLN: 4.0000 mL | Freq: Once | INTRAVENOUS | Status: DC | PRN

## 2021-08-02 MED ORDER — ENSURE ENLIVE PO LIQD
237.0000 mL | Freq: Three times a day (TID) | ORAL | Status: DC
Start: 2021-08-02 — End: 2021-08-05
  Administered 2021-08-02: 11:00:00 237 mL via ORAL

## 2021-08-02 MED ORDER — POTASSIUM CHLORIDE 10 MEQ/100ML IV SOLN: 10.0000 meq | INTRAVENOUS | Status: DC

## 2021-08-02 MED ORDER — POTASSIUM CHLORIDE 10 MEQ/100ML IV SOLN
10.0000 meq | INTRAVENOUS | Status: AC
  Administered 2021-08-02 (×4): 10 meq via INTRAVENOUS
  Filled 2021-08-02 (×4): qty 100

## 2021-08-02 MED ORDER — FENTANYL 12 MCG/HR TD PT72
1.0000 | MEDICATED_PATCH | TRANSDERMAL | Status: DC
  Administered 2021-08-02 – 2021-08-05 (×2): 1 via TRANSDERMAL
  Filled 2021-08-02 (×2): qty 1

## 2021-08-02 MED ORDER — PROPOFOL 10 MG/ML IV BOLUS
INTRAVENOUS | Status: AC
  Filled 2021-08-02: qty 40

## 2021-08-02 MED ORDER — PHENYLEPHRINE HCL (PRESSORS) 10 MG/ML IV SOLN
INTRAVENOUS | Status: AC
  Filled 2021-08-02: qty 1

## 2021-08-02 MED ORDER — LORAZEPAM 2 MG/ML IJ SOLN
0.5000 mg | Freq: Four times a day (QID) | INTRAMUSCULAR | Status: DC | PRN
  Administered 2021-08-03 – 2021-08-05 (×6): 0.5 mg via INTRAVENOUS
  Filled 2021-08-02 (×6): qty 1

## 2021-08-02 MED ORDER — OLANZAPINE 5 MG PO TABS
5.0000 mg | ORAL_TABLET | Freq: Every day | ORAL | Status: DC | PRN
  Filled 2021-08-02: qty 1

## 2021-08-02 MED ORDER — OLANZAPINE 10 MG IM SOLR
5.0000 mg | Freq: Every day | INTRAMUSCULAR | Status: DC | PRN
  Administered 2021-08-03 – 2021-08-04 (×2): 5 mg via INTRAMUSCULAR
  Filled 2021-08-02 (×4): qty 10

## 2021-08-02 MED ORDER — FLUOXETINE HCL 20 MG/5ML PO SOLN
20.0000 mg | Freq: Every day | ORAL | Status: DC
  Filled 2021-08-02 (×2): qty 5

## 2021-08-02 SURGICAL SUPPLY — 47 items
ADH SKN CLS APL DERMABOND .7 (GAUZE/BANDAGES/DRESSINGS) ×1
APL PRP STRL LF DISP 70% ISPRP (MISCELLANEOUS) ×1
BAG SPEC RTRVL LRG 6X4 10 (ENDOMECHANICALS) ×1
CANNULA REDUC XI 12-8 STAPL (CANNULA)
CANNULA REDUCER 12-8 DVNC XI (CANNULA) IMPLANT
CHLORAPREP W/TINT 26 (MISCELLANEOUS) ×3 IMPLANT
COVER TIP SHEARS 8 DVNC (MISCELLANEOUS) ×2 IMPLANT
COVER TIP SHEARS 8MM DA VINCI (MISCELLANEOUS) ×1
DECANTER SPIKE VIAL GLASS SM (MISCELLANEOUS) ×3 IMPLANT
DEFOGGER SCOPE WARMER CLEARIFY (MISCELLANEOUS) ×3 IMPLANT
DERMABOND ADVANCED (GAUZE/BANDAGES/DRESSINGS) ×1
DERMABOND ADVANCED .7 DNX12 (GAUZE/BANDAGES/DRESSINGS) ×2 IMPLANT
DRAPE ARM DVNC X/XI (DISPOSABLE) ×8 IMPLANT
DRAPE COLUMN DVNC XI (DISPOSABLE) ×2 IMPLANT
DRAPE DA VINCI XI ARM (DISPOSABLE) ×4
DRAPE DA VINCI XI COLUMN (DISPOSABLE) ×1
ELECT REM PT RETURN 9FT ADLT (ELECTROSURGICAL) ×2
ELECTRODE REM PT RTRN 9FT ADLT (ELECTROSURGICAL) ×2 IMPLANT
GAUZE 4X4 16PLY ~~LOC~~+RFID DBL (SPONGE) ×3 IMPLANT
GLOVE SURG ORTHO LTX SZ7.5 (GLOVE) ×9 IMPLANT
GOWN STRL REUS W/ TWL LRG LVL3 (GOWN DISPOSABLE) ×8 IMPLANT
GOWN STRL REUS W/TWL LRG LVL3 (GOWN DISPOSABLE) ×8
GRASPER SUT TROCAR 14GX15 (MISCELLANEOUS) IMPLANT
IRRIGATION STRYKERFLOW (MISCELLANEOUS) IMPLANT
IRRIGATOR STRYKERFLOW (MISCELLANEOUS)
IV NS 1000ML (IV SOLUTION)
IV NS 1000ML BAXH (IV SOLUTION) IMPLANT
KIT PINK PAD W/HEAD ARE REST (MISCELLANEOUS) ×2
KIT PINK PAD W/HEAD ARM REST (MISCELLANEOUS) ×2 IMPLANT
KIT TURNOVER KIT A (KITS) ×3 IMPLANT
MANIFOLD NEPTUNE II (INSTRUMENTS) ×3 IMPLANT
NEEDLE HYPO 22GX1.5 SAFETY (NEEDLE) ×3 IMPLANT
NS IRRIG 500ML POUR BTL (IV SOLUTION) ×3 IMPLANT
PACK LAP CHOLECYSTECTOMY (MISCELLANEOUS) ×3 IMPLANT
POUCH SPECIMEN RETRIEVAL 10MM (ENDOMECHANICALS) ×3 IMPLANT
SEAL CANN UNIV 5-8 DVNC XI (MISCELLANEOUS) ×6 IMPLANT
SEAL XI 5MM-8MM UNIVERSAL (MISCELLANEOUS) ×3
STAPLER CANNULA SEAL DVNC XI (STAPLE) IMPLANT
STAPLER CANNULA SEAL XI (STAPLE)
SUT MNCRL 4-0 (SUTURE) ×2
SUT MNCRL 4-0 27XMFL (SUTURE) ×1
SUT VICRYL 0 AB UR-6 (SUTURE) ×3 IMPLANT
SUTURE MNCRL 4-0 27XMF (SUTURE) ×2 IMPLANT
TROCAR XCEL 12X100 BLDLESS (ENDOMECHANICALS) ×3 IMPLANT
TROCAR Z-THREAD FIOS 11X100 BL (TROCAR) ×3 IMPLANT
TUBING INSUFFLATION (TUBING) ×3 IMPLANT
WATER STERILE IRR 500ML POUR (IV SOLUTION) ×3 IMPLANT

## 2021-08-02 NOTE — Progress Notes (Signed)
[facility] SURGICAL PROGRESS NOTE (cpt [phone])  Hospital Day(s): 2.   Post op day(s): Day of Surgery.   Interval History: Patient seen and examined, no acute events or new complaints overnight. Patient reports she is very anxious and asking about her pain medications and when she can eat.  Apparently her last utilization of cocaine was during her interval following leaving AMA.  She tested positive this morning precluding general anesthesia unless emergent.  We all agree her feeding tube is not emergent, though her nutritional status is urgent and we will deal with that as safely as we can. She has been tolerating liquids p.o. and may resume these.   Review of Systems:  Constitutional: denies fever, chills  HEENT: + cough, + dysphagia  Respiratory: denies any shortness of breath  Cardiovascular: denies chest pain or palpitations  Gastrointestinal: denies abdominal pain, N/V Genitourinary: denies burning with urination or urinary frequency   Vital signs in last 24 hours: [min-max] current  Temp:  [97.7 F (36.5 C)-98.8 F (37.1 C)] 97.8 F (36.6 C) (11/18 0734) Pulse Rate:  [66-76] 66 (11/18 0734) Resp:  [15-20] 16 (11/18 0734) BP: (111-146)/(81-93) 128/90 (11/18 0734) SpO2:  [97 %-100 %] 98 % (11/18 0734)     Height: 5\' 1"  (154.9 cm) Weight: 40 kg BMI (Calculated): 16.67   Intake/Output last 2 shifts:  11/17 0701 - 11/18 0700 In: 3281.7 [P.O.:720; I.V.:1819.7; IV Piggyback:742] Out: 2520 [Urine:2520]   Physical Exam:  Constitutional: alert, cooperative. Tearful at times. Cachetic with muscle wasting HENT: normocephalic without obvious abnormality  Eyes: PERRL, EOM's grossly intact and symmetric  Respiratory: breathing non-labored at rest  Cardiovascular: regular rate and sinus rhythm  Gastrointestinal: soft, non-tender, and non-distended, no rebound/guarding    Labs:  CBC Latest Ref Rng & Units 08/02/2021 08/01/2021 07/31/2021  WBC 4.0 - 10.5 K/uL 5.2  4.4 4.9  Hemoglobin 12.0 - 15.0 g/dL 8.9(L) 9.1(L) 8.8(L)  Hematocrit 36.0 - 46.0 % 28.7(L) 28.9(L) 27.8(L)  Platelets 150 - 400 K/uL 262 285 309   CMP Latest Ref Rng & Units 08/02/2021 08/01/2021 07/31/2021  Glucose 70 - 99 mg/dL 105(H) 100(H) 86  BUN 6 - 20 mg/dL <5(L) <5(L) <5(L)  Creatinine 0.44 - 1.00 mg/dL 0.87 0.95 0.81  Sodium 135 - 145 mmol/L 137 136 134(L)  Potassium 3.5 - 5.1 mmol/L 3.4(L) 3.2(L) 3.8  Chloride 98 - 111 mmol/L 109 105 105  CO2 22 - 32 mmol/L 20(L) 22 23  Calcium 8.9 - 10.3 mg/dL 8.0(L) 7.9(L) 8.9  Total Protein 6.5 - 8.1 g/dL - - 8.5(H)  Total Bilirubin 0.3 - 1.2 mg/dL - - 1.3(H)  Alkaline Phos 38 - 126 U/L - - 50  AST 15 - 41 U/L - - 77(H)  ALT 0 - 44 U/L - - 26    Imaging studies: No new pertinent imaging studies   Assessment/Plan: (ICD-10's: K3.89) 55 y.o. 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  As such, we will plan on robotic assisted laparoscopic gastrostomy tube placement Monday the 21st, with Dr Christian Mate  - All risks, benefits, and alternatives to above procedure(s) were discussed with the patient and her husband at bedside, all of her questions were answered to her expressed satisfaction, patient expresses she wishes to proceed, and informed consent was obtained.  - I am okay with her continuing with very small sips of clear liquids, Ensure, and being very  meticulous for comfort; she is adamant about this. Low threshold for NPO. She needs to be NPO at midnight on Sunday for surgery Monday.   - Pain control prn  - Agree with palliative care discussion  - Appreciate oncology input; agree with feeding tube placement, recommending MRI Brain for staging completion, psychiatric evaluation for competency, and vascular surgery evaluation as this mass may be encasing large vessels  - Further management per primary service; we  will follow    I discussed with patient and with her significant other at bedside, though overall risks for feeding tube, considering her very thin abdominal wall that the risks of leakage, gastric erosion to the skin etc. is very high.  However there appears to be no other good alternative and enteric feeding will be far better for her than TPN for nutritional replenishment. I am not 1 to diminish any patient's hope that treatment will be helpful, she remains desperate to do all that we can.  All of the above findings and recommendations were discussed with the patient, patient's family (husband at bedside), and the medical team, and all of patient's and family's questions were answered to her expressed satisfaction.  -- Ronny Bacon, M.D.  [facility] 08/02/2021, 8:10 AM

## 2021-08-02 NOTE — Progress Notes (Signed)
Hematology/Oncology Progress Note [facility] [phone] [phone]  Patient Care Team: Venita Lick, NP as PCP - General (Nurse Practitioner) Vanita Ingles, RN as Registered Nurse (General Practice)   Name of the patient: [name]  [id number]  09-12-1966  Date of visit: 08/02/21   INTERVAL HISTORY-  Patient was seen at the bedside. Patient wants "something for my nerves".   Review of systems- Review of Systems - Oncology  Allergies  Allergen Reactions   Penicillins Rash and Other (See Comments)    TOLERATED CEPHALOSPORINS Has patient had a PCN reaction causing immediate rash, facial/tongue/throat swelling, SOB or lightheadedness with hypotension: No Has patient had a PCN reaction causing severe rash involving mucus membranes or skin necrosis: No Has patient had a PCN reaction that required hospitalization No Has patient had a PCN reaction occurring within the last 10 years: No If all of the above answers are "NO", then may proceed with Cephalosporin use.    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   GERD (gastroesophageal reflux disease) 07/31/2021   Macrocytic anemia 07/31/2021   Protein-calorie malnutrition, moderate ([place]) 07/31/2021   Prolonged Q-T interval on ECG    Aspiration pneumonia ([place]) 07/28/2021   Hypoglycemia 07/28/2021   Transaminitis 07/28/2021   Dysphagia 07/28/2021   Neck mass 07/28/2021   Hypomagnesemia 07/28/2021   Hypokalemia 07/28/2021   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   Chronic pain syndrome 07/20/2015   COPD (chronic obstructive pulmonary disease) ([place]) 07/20/2015    Hepatitis C 07/20/2015   Alcohol use disorder, severe, dependence ([place]) 03/22/2015   Chronic pancreatitis ([place]) 03/18/2015   Hypothyroidism 03/18/2015   Hypertension 03/18/2013   Nicotine dependence 01/14/2011   Mixed, or nondependent drug abuse, in remission ([place]) 12/26/2010     Past Medical History:  Diagnosis Date   Alcohol abuse    Asthma    Cancer ([place])    "carcinoma of throat" with s/p thyroidectomy   Chronic pain syndrome    Chronic pancreatitis ([facility])    COPD (chronic obstructive pulmonary disease) ([place])    Depression    Hepatitis C    History of alcohol abuse    History of drug use    Hypertension 03/18/2013   Hypothyroidism    Low back pain    Pancreatic pseudocyst 03/18/2015   Seizures ([place])    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     Past Surgical History:  Procedure Laterality Date   CESAREAN SECTION     GASTROSTOMY TUBE PLACEMENT  2011   TOTAL THYROIDECTOMY      Social History   Socioeconomic History   Marital status: Single    Spouse name: Not on file   Number of children: Not on file   Years of education: Not on file   Highest education level: Not on file  Occupational History   Not on file  Tobacco Use   Smoking status: Every Day    Packs/day: 0.25    Types: Cigarettes   Smokeless tobacco: Never  Vaping Use   Vaping Use: Never used  Substance and Sexual Activity   Alcohol use: Yes    Alcohol/week: 0.0 standard drinks    Comment: pt states she has  a drink every day   Drug use: Yes    Types: Marijuana, Cocaine    Comment: History of drug use   Sexual activity: Not on file  Other Topics Concern   Not on file  Social History Narrative   Not on file   Social Determinants of Health   Financial Resource Strain: High Risk   Difficulty of Paying Living Expenses: Very hard  Food Insecurity: No Food Insecurity   Worried About Running Out of Food in the Last Year: Never true   Ran Out of Food in the Last Year: Never true  Transportation Needs: No  Transportation Needs   Lack of Transportation (Medical): No   Lack of Transportation (Non-Medical): No  Physical Activity: Inactive   Days of Exercise per Week: 0 days   Minutes of Exercise per Session: 0 min  Stress: Stress Concern Present   Feeling of Stress : Very much  Social Connections: Socially Isolated   Frequency of Communication with Friends and Family: Never   Frequency of Social Gatherings with Friends and Family: Never   Attends Religious Services: Never   Marine scientist or Organizations: No   Attends Music therapist: Never   Marital Status: Married  Human resources officer Violence: Not At Risk   Fear of Current or Ex-Partner: No   Emotionally Abused: No   Physically Abused: No   Sexually Abused: No     Family History  Problem Relation Age of Onset   Diabetes Mellitus II Maternal Grandfather    CAD Maternal Grandfather    Diabetes Mellitus II Mother    Liver cancer Mother    Arthritis Mother    Cancer Mother        lung   Cancer Father    Thyroid disease Son    Diabetes Mellitus II Maternal Aunt    Pancreatic cancer Paternal Aunt      Current Facility-Administered Medications:    cefTRIAXone (ROCEPHIN) 2 g in sodium chloride 0.9 % 100 mL IVPB, 2 g, Intravenous, Q24H, Ivor Costa, MD, Stopped at 08/02/21 0559   dextromethorphan-guaiFENesin ([place] DM) 30-600 MG per 12 hr tablet 1 tablet, 1 tablet, Oral, BID PRN, Ivor Costa, MD   dextrose 5 %-0.9 % sodium chloride infusion, , Intravenous, Continuous, Ivor Costa, MD, Last Rate: 100 mL/hr at 08/02/21 1107, New Bag at 08/02/21 1107   feeding supplement (ENSURE ENLIVE / ENSURE PLUS) liquid 237 mL, 237 mL, Oral, TID BM, Edison Simon R, PA-C, 237 mL at 08/02/21 1055   fentaNYL (DURAGESIC) 12 MCG/HR 1 patch, 1 patch, Transdermal, Q72H, Borders, Kirt Boys, NP, 1 patch at 08/02/21 1402   gadobutrol (GADAVIST) 1 MMOL/ML injection 4 mL, 4 mL, Intravenous, Once PRN, Earlie Server, MD   hydrALAZINE (APRESOLINE)  injection 5 mg, 5 mg, Intravenous, Q2H PRN, Ivor Costa, MD   HYDROmorphone (DILAUDID) injection 0.5 mg, 0.5 mg, Intravenous, Q3H PRN, Borders, Vonna Kotyk R, NP, 0.5 mg at 08/02/21 1606   levothyroxine (SYNTHROID, LEVOTHROID) injection 100 mcg, 100 mcg, Intravenous, Daily, Ivor Costa, MD, 100 mcg at 08/02/21 1052   LORazepam (ATIVAN) injection 0-4 mg, 0-4 mg, Intravenous, Q12H, 1 mg at 08/02/21 1402 **OR** LORazepam (ATIVAN) tablet 0-4 mg, 0-4 mg, Oral, Q12H, Ivor Costa, MD   LORazepam (ATIVAN) injection 0.5 mg, 0.5 mg, Intravenous, Q6H PRN, Patrecia Pour, NP   metroNIDAZOLE (FLAGYL) IVPB 500 mg, 500 mg, Intravenous, Q12H, Dorothe Pea, RPH, Last Rate: 100 mL/hr at 08/02/21 0853, 500 mg at 08/02/21 [phone]  nicotine (NICODERM CQ - dosed in mg/24 hours) patch 21 mg, 21 mg, Transdermal, Daily, Ivor Costa, MD, 21 mg at 08/02/21 1039   OLANZapine (ZYPREXA) injection 5 mg, 5 mg, Intramuscular, Daily PRN **OR** OLANZapine (ZYPREXA) tablet 5 mg, 5 mg, Oral, Daily PRN, Reita Cliche, Asa Saunas, NP   pantoprazole (PROTONIX) injection 40 mg, 40 mg, Intravenous, Q12H, Ivor Costa, MD, 40 mg at 08/02/21 1047   potassium chloride 10 mEq in 100 mL IVPB, 10 mEq, Intravenous, Q1 Hr x 4, Dorothe Pea, RPH, Last Rate: 100 mL/hr at 08/02/21 1545, 10 mEq at 08/02/21 1545   [DISCONTINUED] thiamine tablet 100 mg, 100 mg, Oral, Daily **OR** thiamine (B-1) injection 100 mg, 100 mg, Intravenous, Daily, Ivor Costa, MD, 100 mg at 08/02/21 1045   Physical exam:  Vitals:   08/02/21 0828 08/02/21 1229 08/02/21 1601 08/02/21 1610  BP: (!) 166/89 (!) 158/97 (!) 172/99 (!) 157/93  Pulse: 78 84 81 76  Resp: 18 18  18   Temp: 97.9 F (36.6 C) 98.3 F (36.8 C)  98.2 F (36.8 C)  TempSrc: Oral Oral    SpO2: 100% 100% 100% 92%  Weight:      Height:       Physical Exam HENT:     Head: Normocephalic.  Neck:     Comments: Right-sided neck mass Cardiovascular:     Rate and Rhythm: Normal rate.  Pulmonary:     Effort: Pulmonary  effort is normal. No respiratory distress.  Abdominal:     General: There is no distension.  Musculoskeletal:        General: Normal range of motion.     Cervical back: Normal range of motion.  Skin:    General: Skin is warm.  Neurological:     General: No focal deficit present.     Mental Status: She is alert.       CMP Latest Ref Rng & Units 08/02/2021  Glucose 70 - 99 mg/dL 105(H)  BUN 6 - 20 mg/dL <5(L)  Creatinine 0.44 - 1.00 mg/dL 0.87  Sodium 135 - 145 mmol/L 137  Potassium 3.5 - 5.1 mmol/L 3.4(L)  Chloride 98 - 111 mmol/L 109  CO2 22 - 32 mmol/L 20(L)  Calcium 8.9 - 10.3 mg/dL 8.0(L)  Total Protein 6.5 - 8.1 g/dL -  Total Bilirubin 0.3 - 1.2 mg/dL -  Alkaline Phos 38 - 126 U/L -  AST 15 - 41 U/L -  ALT 0 - 44 U/L -   CBC Latest Ref Rng & Units 08/02/2021  WBC 4.0 - 10.5 K/uL 5.2  Hemoglobin 12.0 - 15.0 g/dL 8.9(L)  Hematocrit 36.0 - 46.0 % 28.7(L)  Platelets 150 - 400 K/uL 262    RADIOGRAPHIC STUDIES: I have personally reviewed the radiological images as listed and agreed with the findings in the report. CT HEAD WO CONTRAST (5MM)  Result Date: 08/01/2021 CLINICAL DATA:  Delirium.  Esophageal cancer EXAM: CT HEAD WITHOUT CONTRAST TECHNIQUE: Contiguous axial images were obtained from the base of the skull through the vertex without intravenous contrast. COMPARISON:  None. FINDINGS: Brain: No evidence of acute infarction, hemorrhage, hydrocephalus, extra-axial collection or mass lesion/mass effect. Vascular: Negative for hyperdense vessel Skull: Negative Sinuses/Orbits: Negative Other: None IMPRESSION: Negative CT head Electronically Signed   By: Franchot Gallo M.D.   On: 08/01/2021 09:29   CT Soft Tissue Neck W Contrast  Result Date: 07/28/2021 CLINICAL DATA:  Abdominal pain for 4 months, worsening. History of alcohol abuse. Neck abscess, deep tissue. EXAM:  CT NECK WITH CONTRAST TECHNIQUE: Multidetector CT imaging of the neck was performed using the standard  protocol following the bolus administration of intravenous contrast. CONTRAST:  71mL OMNIPAQUE IOHEXOL 300 MG/ML  SOLN COMPARISON:  None. FINDINGS: Pharynx, larynx, and esophagus: At the level of the thoracic inlet is a large mass measuring up to 6.7 cm, displacing the carotid sheath anteriorly, the trachea anteriorly and leftward, in the esophagus leftward. Margins with the right wall of the esophagus are indistinct and there is a gas channel in the region of the expected esophageal wall. History of abscess but the mass appears more solid and necrotic appearing with limited regional fat edema and encasement of vessels including the right proximal vertebral artery, right common carotid, and proximal right subclavian. Burtis Junes an exophytic esophageal mass. Salivary glands: No inflammation, mass, or stone. Thyroid: Atrophic if not resected. Lymph nodes: 10 mm left level 4 lymph node. Vascular: Vessel encasement as above. No acute vascular finding. Atheromatous calcification. Limited intracranial: Negative Visualized orbits: Negative Mastoids and visualized paranasal sinuses: Clear Skeleton: Cervical spine degeneration Upper chest: Apical emphysema. IMPRESSION: 1. Nearly 7 cm mass at the thoracic inlet, favor locally advanced esophageal cancer with encasement of major vessels. The mass approaches the skin surface and may be amenable to percutaneous biopsy. 2. Single mildly enlarged left level 4 lymph node. Electronically Signed   By: Jorje Guild M.D.   On: 07/28/2021 05:48   CT Angio Chest PE W and/or Wo Contrast  Addendum Date: 07/28/2021   ADDENDUM REPORT: 07/28/2021 06:35 ADDENDUM: Omitted impression #3 of ascending aortic aneurysm (measuring 4.1 cm in diameter) significance to be determined by workup of the patient's neck mass. Electronically Signed   By: Jorje Guild M.D.   On: 07/28/2021 06:35   Result Date: 07/28/2021 CLINICAL DATA:  Abdominal distension in the left upper quadrant. Abdominal pain.  EXAM: CT ANGIOGRAPHY CHEST CT ABDOMEN AND PELVIS WITH CONTRAST TECHNIQUE: Multidetector CT imaging of the chest was performed using the standard protocol during bolus administration of intravenous contrast. Multiplanar CT image reconstructions and MIPs were obtained to evaluate the vascular anatomy. Multidetector CT imaging of the abdomen and pelvis was performed using the standard protocol during bolus administration of intravenous contrast. CONTRAST:  44mL OMNIPAQUE IOHEXOL 300 MG/ML  SOLN COMPARISON:  Abdominal CT 11/28/2015 FINDINGS: CTA CHEST FINDINGS Cardiovascular: Cardiomegaly. Small to moderate low-density pericardial effusion measuring up to 11 mm in thickness. Aneurysmal ascending aorta measuring up to 4.1 cm in diameter. Mediastinum/Nodes: Mass at the thoracic inlet and upper mediastinum as described on dedicated neck CT no lymphadenopathy seen in the mediastinum. Lungs/Pleura: Airway thickening with debris in the right sided airways there is associated with airspace opacity in the inferior and posterior right upper lobe. Mild atelectasis in the medial right middle lobe and lingula. Musculoskeletal: Scoliosis. Review of the MIP images confirms the above findings. CT ABDOMEN and PELVIS FINDINGS Hepatobiliary: No focal liver abnormality.Prominence of intrahepatic bile ducts. No calcified cholelithiasis. Pancreas: Unremarkable. Spleen: Unremarkable. Adrenals/Urinary Tract: Negative adrenals. No hydronephrosis or stone. Unremarkable bladder. Stomach/Bowel:  No obstruction. No appendicitis. 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 There is also chronic occlusion of the distal splenic vein. Numerous upper abdominal collaterals. Atheromatous calcification of the aorta and iliacs. No mass or adenopathy. Reproductive:No pathologic findings. Other: No ascites or pneumoperitoneum. Musculoskeletal: No acute abnormalities. Review of the MIP images confirms the above findings.  IMPRESSION: Chest CTA: 1. Large mass at the thoracic inlet, reference neck CT. 2. Extensive debris in  right-sided airways with right upper lobe opacity likely reflecting pneumonia or aspiration. Abdominal CT: 1. No acute finding. 2. 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 Electronically Signed: By: Jorje Guild M.D. On: 07/28/2021 05:56   MR BRAIN WO CONTRAST  Result Date: 08/02/2021 CLINICAL DATA:  Gastrointestinal cancer, staging EXAM: MRI HEAD WITHOUT CONTRAST TECHNIQUE: Multiplanar, multiecho pulse sequences of the brain and surrounding structures were obtained without intravenous contrast. COMPARISON:  CT head 08/01/2021. FINDINGS: Evaluation is limited as the exam is incomplete and no intravenous contrast was given. Brain: No restricted diffusion to suggest acute infarct; metastatic disease could also restrict diffusion. No acute hemorrhage, significant edema mass, mass effect, or midline shift. Minimal T2 hyperintense signal in the periventricular white matter, likely the sequela of chronic small vessel ischemic disease. Vascular: Normal flow voids. Skull and upper cervical spine: No diffusion restricting lesions in the imaged osseous structures. Sinuses/Orbits: Negative. Other: Fluid in right mastoid air cells. IMPRESSION: 1. Limited evaluation secondary to early termination exam. Within this limitation, no acute intracranial process. 2. Although evaluation for metastatic disease is significantly limited by the absence of contrast, no edema or diffusion restriction is seen, both of which can be seen in the setting of metastatic disease. Consider repeating the exam with contrast when the patient is better able to tolerate it. Electronically Signed   By: Merilyn Baba M.D.   On: 08/02/2021 12:30   CT ABDOMEN PELVIS W CONTRAST  Addendum Date: 07/28/2021   ADDENDUM REPORT: 07/28/2021 06:35 ADDENDUM: Omitted impression #3 of ascending aortic aneurysm  (measuring 4.1 cm in diameter) significance to be determined by workup of the patient's neck mass. Electronically Signed   By: Jorje Guild M.D.   On: 07/28/2021 06:35   Result Date: 07/28/2021 CLINICAL DATA:  Abdominal distension in the left upper quadrant. Abdominal pain. EXAM: CT ANGIOGRAPHY CHEST CT ABDOMEN AND PELVIS WITH CONTRAST TECHNIQUE: Multidetector CT imaging of the chest was performed using the standard protocol during bolus administration of intravenous contrast. Multiplanar CT image reconstructions and MIPs were obtained to evaluate the vascular anatomy. Multidetector CT imaging of the abdomen and pelvis was performed using the standard protocol during bolus administration of intravenous contrast. CONTRAST:  62mL OMNIPAQUE IOHEXOL 300 MG/ML  SOLN COMPARISON:  Abdominal CT 11/28/2015 FINDINGS: CTA CHEST FINDINGS Cardiovascular: Cardiomegaly. Small to moderate low-density pericardial effusion measuring up to 11 mm in thickness. Aneurysmal ascending aorta measuring up to 4.1 cm in diameter. Mediastinum/Nodes: Mass at the thoracic inlet and upper mediastinum as described on dedicated neck CT no lymphadenopathy seen in the mediastinum. Lungs/Pleura: Airway thickening with debris in the right sided airways there is associated with airspace opacity in the inferior and posterior right upper lobe. Mild atelectasis in the medial right middle lobe and lingula. Musculoskeletal: Scoliosis. Review of the MIP images confirms the above findings. CT ABDOMEN and PELVIS FINDINGS Hepatobiliary: No focal liver abnormality.Prominence of intrahepatic bile ducts. No calcified cholelithiasis. Pancreas: Unremarkable. Spleen: Unremarkable. Adrenals/Urinary Tract: Negative adrenals. No hydronephrosis or stone. Unremarkable bladder. Stomach/Bowel:  No obstruction. No appendicitis. 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 There is also chronic occlusion of the distal splenic vein.  Numerous upper abdominal collaterals. Atheromatous calcification of the aorta and iliacs. No mass or adenopathy. Reproductive:No pathologic findings. Other: No ascites or pneumoperitoneum. Musculoskeletal: No acute abnormalities. Review of the MIP images confirms the above findings. IMPRESSION: Chest CTA: 1. Large mass at the thoracic inlet, reference neck CT. 2. Extensive debris in right-sided airways  with right upper lobe opacity likely reflecting pneumonia or aspiration. Abdominal CT: 1. No acute finding. 2. 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 Electronically Signed: By: Jorje Guild M.D. On: 07/28/2021 05:56   DG Chest Portable 1 View  Result Date: 07/31/2021 CLINICAL DATA:  55 year old female with cancer at the thoracic inlet, coughing and vomiting blood. Aspiration. EXAM: PORTABLE CHEST 1 VIEW COMPARISON:  Chest CTA 07/28/2021 and earlier. FINDINGS: Portable AP upright view at 0540 hours. Lung volumes remain normal. Heart size and central mediastinal contours remain within normal limits. Vague increased right paratracheal density corresponds to indistinct right thoracic inlet soft tissue mass seen by CT. Mild leftward deviation of the trachea but no significant tracheal narrowing. Patchy, spiculated mid right lung opacity as seen recently by CT. But otherwise allowing for portable technique the lungs are clear. No acute osseous abnormality identified. Negative visible bowel gas. IMPRESSION: 1. Patchy right mid lung opacity stable from the recent CT. 2. Vague increased right paratracheal density corresponding to large right thoracic inlet mass. 3. No new cardiopulmonary abnormality. Electronically Signed   By: Genevie Ann M.D.   On: 07/31/2021 05:52   [place] CORE BIOPSY (SOFT TISSUE)  Result Date: 07/29/2021 INDICATION: Large neck mass at the thoracic inlet favored to be advanced esophageal cancer, dysphagia EXAM: ULTRASOUND CORE BIOPSY LARGE RIGHT NECK MASS  MEDICATIONS: 1% LIDOCAINE LOCAL ANESTHESIA/SEDATION: None. Total intra-service moderate Sedation Time: 0 minutes. The patient's level of consciousness and vital signs were monitored continuously by radiology nursing throughout the procedure under my direct supervision. FLUOROSCOPY TIME:  Fluoroscopy Time: None. COMPLICATIONS: None immediate. PROCEDURE: Informed written consent was obtained from the patient after a thorough discussion of the procedural risks, benefits and alternatives. All questions were addressed. Maximal Sterile Barrier Technique was utilized including caps, mask, sterile gowns, sterile gloves, sterile drape, hand hygiene and skin antiseptic. A timeout was performed prior to the initiation of the procedure. Previous imaging reviewed. The large predominately right neck mass was localized and marked just medial to the right carotid artery. Under sterile conditions and local anesthesia, an 18 gauge core biopsy needle was advanced into the right neck mass. Needle position confirmed with ultrasound. Images obtained for documentation. 3 18 gauge core biopsies obtained. Samples were intact and non fragmented. These were placed in formalin. Needle removed. Postprocedure imaging demonstrates no hemorrhage or hematoma. Patient tolerated the biopsy well. IMPRESSION: Successful ultrasound right neck mass 18 gauge core biopsies. Electronically Signed   By: Jerilynn Mages.  Shick M.D.   On: 07/29/2021 13:34    Assessment and plan-   # Neck mass status post IR biopsy.  Pathology is consistent with squamous cell carcinoma.  I discussed with pathology, tissue origin was not able to be identified further.  Discussed her case on tumor board on 08/01/2021, esophageal cancer versus other tissue origin. Her clinical diagnosis is esophageal squamous carcinoma.  Localized advanced disease.  Brain MRI negative. Recommend concurrent chemotherapy and radiation, will wait until acute encephalopathy/agitation resolves and feeding  tube placed. Awaiting psychiatry evaluation of capacity to further discuss about treatment options.  #Dysphagia, malnutrition, awaiting feeding tube placement next week. #Hemoptysis, vascular surgeon recommendation reviewed.  Her tumor is encasing great vessels are not small branch that can be prophylactically embolized.  No intervention at this point.  #Alcohol abuse, on CIWA protocol #Major depressive disorder, recurrent.  Was started on Prozac by psychiatry today. #Agitation, Ativan 0.5 every 6 hours as needed IV.  Thank you for allowing me to  participate in the care of this patient.   Earlie Server, MD, PhD 08/02/2021

## 2021-08-02 NOTE — Progress Notes (Signed)
[place]  [phone] [phone]   Name: [name] Date: 08/02/2021 MRN: [id number]  DOB: 01-Mar-1966  Patient Care Team: Venita Lick, NP as PCP - General (Nurse Practitioner) Vanita Ingles, RN as Registered Nurse (General Practice)    REASON FOR CONSULTATION: [name] is a 55 y.o. female with multiple medical problems including including chronic pancreatitis, alcoholism, hypothyroidism status post thyroidectomy, bipolar disorder, HCV, and COPD.  Patient was recently hospitalized 11/13-11/15 with aspiration pneumonia and was found to have a large neck mass.  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  Patient ultimately left AMA but was readmitted 07/31/2021 with hemoptysis.  Palliative care was consulted to address goals..   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    ALLERGIES:  is allergic to penicillins.  MEDICATIONS:  Current Facility-Administered Medications  Medication Dose Route Frequency Provider Last Rate Last Admin   cefTRIAXone (ROCEPHIN) 2 g in sodium chloride 0.9 % 100 mL IVPB  2 g Intravenous Q24H Ivor Costa, MD   Stopped at  08/02/21 0559   dextromethorphan-guaiFENesin ([place] DM) 30-600 MG per 12 hr tablet 1 tablet  1 tablet Oral BID PRN Ivor Costa, MD       dextrose 5 %-0.9 % sodium chloride infusion   Intravenous Continuous Ivor Costa, MD 100 mL/hr at 08/02/21 1107 New Bag at 08/02/21 1107   feeding supplement (ENSURE ENLIVE / ENSURE PLUS) liquid 237 mL  237 mL Oral TID BM Edison Simon R, PA-C   237 mL at 08/02/21 1055   fentaNYL (DURAGESIC) 12 MCG/HR 1 patch  1 patch Transdermal Q72H Shalene Gallen, Kirt Boys, NP   1 patch at 08/02/21 1402   FLUoxetine (PROZAC) 20 MG/5ML solution 20 mg  20 mg Oral Daily Patrecia Pour, NP       gadobutrol (GADAVIST) 1 MMOL/ML injection 4 mL  4 mL Intravenous Once PRN Earlie Server, MD       hydrALAZINE (APRESOLINE) injection 5 mg  5 mg Intravenous Q2H PRN Ivor Costa, MD       HYDROmorphone (DILAUDID) injection 0.5 mg  0.5 mg Intravenous Q3H PRN Navpreet Szczygiel, Kirt Boys, NP   0.5 mg at 08/02/21 1242   levothyroxine (SYNTHROID, LEVOTHROID) injection 100 mcg  100 mcg Intravenous Daily Ivor Costa, MD   100 mcg at 08/02/21 1052   LORazepam (ATIVAN) injection 0-4 mg  0-4 mg Intravenous Q12H Ivor Costa, MD   1 mg at 08/02/21 1402   Or   LORazepam (ATIVAN) tablet 0-4 mg  0-4 mg Oral Q12H Ivor Costa,  MD       metroNIDAZOLE (FLAGYL) IVPB 500 mg  500 mg Intravenous Q12H Dorothe Pea, RPH 100 mL/hr at 08/02/21 0853 500 mg at 08/02/21 0853   nicotine (NICODERM CQ - dosed in mg/24 hours) patch 21 mg  21 mg Transdermal Daily Ivor Costa, MD   21 mg at 08/02/21 1039   OLANZapine (ZYPREXA) injection 5 mg  5 mg Intramuscular Daily PRN Sherlon Handing, NP       pantoprazole (PROTONIX) injection 40 mg  40 mg Intravenous Q12H Ivor Costa, MD   40 mg at 08/02/21 1047   potassium chloride 10 mEq in 100 mL IVPB  10 mEq Intravenous Q1 Hr x 4 Dorothe Pea, RPH 100 mL/hr at 08/02/21 1409 10 mEq at 08/02/21 1409   thiamine (B-1) injection 100 mg  100 mg Intravenous Daily Ivor Costa, MD   100 mg at 08/02/21 1045     VITAL SIGNS: BP (!) 158/97 (BP Location: Left Arm)   Pulse 84   Temp 98.3 F (36.8 C) (Oral)   Resp 18   Ht 5\' 1"  (1.549 m)   Wt 88 lb 2.9 oz (40 kg)   LMP  (LMP Unknown)   SpO2 100%   BMI 16.66 kg/m  Filed Weights   07/31/21 0532  Weight: 88 lb 2.9 oz (40 kg)    Estimated body mass index is 16.66 kg/m as calculated from the following:   Height as of this encounter: 5\' 1"  (1.549 m).   Weight as of this encounter: 88 lb 2.9 oz (40 kg).  LABS: CBC:    Component Value Date/Time   WBC 5.2 08/02/2021 0642   HGB 8.9 (L) 08/02/2021 0642   HGB 13.6 08/16/2020 1613   HCT 28.7 (L) 08/02/2021 0642   HCT 40.0 08/16/2020 1613   PLT 262 08/02/2021 0642   PLT 245 08/16/2020 1613   MCV 107.5 (H) 08/02/2021 0642   MCV 101 (H) 08/16/2020 1613   MCV 109 (H) 12/27/2013 0847   NEUTROABS 2.8 07/31/2021 0542   NEUTROABS 2.5 08/16/2020 1613   NEUTROABS 8.1 (H) 03/18/2013 0525   LYMPHSABS 1.1 07/31/2021 0542   LYMPHSABS 2.3 08/16/2020 1613   LYMPHSABS 1.1 03/18/2013 0525   MONOABS 0.2 07/31/2021 0542   MONOABS 0.7 03/18/2013 0525   EOSABS 0.0 07/31/2021 0542   EOSABS 0.2 08/16/2020 1613   EOSABS 0.0 03/18/2013 0525   BASOSABS 0.0 07/31/2021 0542   BASOSABS 0.1 08/16/2020 1613   BASOSABS 0.0 03/18/2013 0525   Comprehensive Metabolic Panel:    Component Value Date/Time   NA 137 08/02/2021 0642   NA 136 08/16/2020 1613   NA 137 12/27/2013 0847   K 3.4 (L) 08/02/2021 0642   K 3.4 (L) 12/27/2013 0847   CL 109 08/02/2021 0642   CL 104 12/27/2013 0847   CO2 20 (L) 08/02/2021 0642   CO2 27 12/27/2013 0847   BUN <5 (L) 08/02/2021 0642   BUN 14 08/16/2020 1613   BUN 13 12/27/2013 0847   CREATININE 0.87 08/02/2021 0642   CREATININE 0.66 12/27/2013 0847   GLUCOSE 105 (H) 08/02/2021 0642   GLUCOSE 90 12/27/2013 0847   CALCIUM 8.0 (L) 08/02/2021 0642   CALCIUM 8.7 12/27/2013 0847   AST 77 (H) 07/31/2021 0542   AST 470 (H) 12/27/2013 0847   ALT 26 07/31/2021 0542   ALT 356 (H)  12/27/2013 0847   ALKPHOS 50 07/31/2021 0542   ALKPHOS 147 (H) 12/27/2013 0847   BILITOT 1.3 (H)  07/31/2021 0542   BILITOT 0.5 08/16/2020 1613   BILITOT 0.5 12/27/2013 0847   PROT 8.5 (H) 07/31/2021 0542   PROT 7.9 08/16/2020 1613   PROT 8.4 (H) 12/27/2013 0847   ALBUMIN 3.9 07/31/2021 0542   ALBUMIN 4.6 08/16/2020 1613   ALBUMIN 3.3 (L) 12/27/2013 0847    RADIOGRAPHIC STUDIES: CT HEAD WO CONTRAST (5MM)  Result Date: 08/01/2021 CLINICAL DATA:  Delirium.  Esophageal cancer EXAM: CT HEAD WITHOUT CONTRAST TECHNIQUE: Contiguous axial images were obtained from the base of the skull through the vertex without intravenous contrast. COMPARISON:  None. FINDINGS: Brain: No evidence of acute infarction, hemorrhage, hydrocephalus, extra-axial collection or mass lesion/mass effect. Vascular: Negative for hyperdense vessel Skull: Negative Sinuses/Orbits: Negative Other: None IMPRESSION: Negative CT head Electronically Signed   By: Franchot Gallo M.D.   On: 08/01/2021 09:29   CT Soft Tissue Neck W Contrast  Result Date: 07/28/2021 CLINICAL DATA:  Abdominal pain for 4 months, worsening. History of alcohol abuse. Neck abscess, deep tissue. EXAM: CT NECK WITH CONTRAST TECHNIQUE: Multidetector CT imaging of the neck was performed using the standard protocol following the bolus administration of intravenous contrast. CONTRAST:  77mL OMNIPAQUE IOHEXOL 300 MG/ML  SOLN COMPARISON:  None. FINDINGS: Pharynx, larynx, and esophagus: At the level of the thoracic inlet is a large mass measuring up to 6.7 cm, displacing the carotid sheath anteriorly, the trachea anteriorly and leftward, in the esophagus leftward. Margins with the right wall of the esophagus are indistinct and there is a gas channel in the region of the expected esophageal wall. History of abscess but the mass appears more solid and necrotic appearing with limited regional fat edema and encasement of vessels including the right proximal vertebral artery,  right common carotid, and proximal right subclavian. Burtis Junes an exophytic esophageal mass. Salivary glands: No inflammation, mass, or stone. Thyroid: Atrophic if not resected. Lymph nodes: 10 mm left level 4 lymph node. Vascular: Vessel encasement as above. No acute vascular finding. Atheromatous calcification. Limited intracranial: Negative Visualized orbits: Negative Mastoids and visualized paranasal sinuses: Clear Skeleton: Cervical spine degeneration Upper chest: Apical emphysema. IMPRESSION: 1. Nearly 7 cm mass at the thoracic inlet, favor locally advanced esophageal cancer with encasement of major vessels. The mass approaches the skin surface and may be amenable to percutaneous biopsy. 2. Single mildly enlarged left level 4 lymph node. Electronically Signed   By: Jorje Guild M.D.   On: 07/28/2021 05:48   CT Angio Chest PE W and/or Wo Contrast  Addendum Date: 07/28/2021   ADDENDUM REPORT: 07/28/2021 06:35 ADDENDUM: Omitted impression #3 of ascending aortic aneurysm (measuring 4.1 cm in diameter) significance to be determined by workup of the patient's neck mass. Electronically Signed   By: Jorje Guild M.D.   On: 07/28/2021 06:35   Result Date: 07/28/2021 CLINICAL DATA:  Abdominal distension in the left upper quadrant. Abdominal pain. EXAM: CT ANGIOGRAPHY CHEST CT ABDOMEN AND PELVIS WITH CONTRAST TECHNIQUE: Multidetector CT imaging of the chest was performed using the standard protocol during bolus administration of intravenous contrast. Multiplanar CT image reconstructions and MIPs were obtained to evaluate the vascular anatomy. Multidetector CT imaging of the abdomen and pelvis was performed using the standard protocol during bolus administration of intravenous contrast. CONTRAST:  63mL OMNIPAQUE IOHEXOL 300 MG/ML  SOLN COMPARISON:  Abdominal CT 11/28/2015 FINDINGS: CTA CHEST FINDINGS Cardiovascular: Cardiomegaly. Small to moderate low-density pericardial effusion measuring up to 11 mm in  thickness. Aneurysmal ascending aorta measuring up to 4.1 cm in diameter. Mediastinum/Nodes: Mass  at the thoracic inlet and upper mediastinum as described on dedicated neck CT no lymphadenopathy seen in the mediastinum. Lungs/Pleura: Airway thickening with debris in the right sided airways there is associated with airspace opacity in the inferior and posterior right upper lobe. Mild atelectasis in the medial right middle lobe and lingula. Musculoskeletal: Scoliosis. Review of the MIP images confirms the above findings. CT ABDOMEN and PELVIS FINDINGS Hepatobiliary: No focal liver abnormality.Prominence of intrahepatic bile ducts. No calcified cholelithiasis. Pancreas: Unremarkable. Spleen: Unremarkable. Adrenals/Urinary Tract: Negative adrenals. No hydronephrosis or stone. Unremarkable bladder. Stomach/Bowel:  No obstruction. No appendicitis. 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 There is also chronic occlusion of the distal splenic vein. Numerous upper abdominal collaterals. Atheromatous calcification of the aorta and iliacs. No mass or adenopathy. Reproductive:No pathologic findings. Other: No ascites or pneumoperitoneum. Musculoskeletal: No acute abnormalities. Review of the MIP images confirms the above findings. IMPRESSION: Chest CTA: 1. Large mass at the thoracic inlet, reference neck CT. 2. Extensive debris in right-sided airways with right upper lobe opacity likely reflecting pneumonia or aspiration. Abdominal CT: 1. No acute finding. 2. 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 Electronically Signed: By: Jorje Guild M.D. On: 07/28/2021 05:56   MR BRAIN WO CONTRAST  Result Date: 08/02/2021 CLINICAL DATA:  Gastrointestinal cancer, staging EXAM: MRI HEAD WITHOUT CONTRAST TECHNIQUE: Multiplanar, multiecho pulse sequences of the brain and surrounding structures were obtained without intravenous contrast. COMPARISON:  CT  head 08/01/2021. FINDINGS: Evaluation is limited as the exam is incomplete and no intravenous contrast was given. Brain: No restricted diffusion to suggest acute infarct; metastatic disease could also restrict diffusion. No acute hemorrhage, significant edema mass, mass effect, or midline shift. Minimal T2 hyperintense signal in the periventricular white matter, likely the sequela of chronic small vessel ischemic disease. Vascular: Normal flow voids. Skull and upper cervical spine: No diffusion restricting lesions in the imaged osseous structures. Sinuses/Orbits: Negative. Other: Fluid in right mastoid air cells. IMPRESSION: 1. Limited evaluation secondary to early termination exam. Within this limitation, no acute intracranial process. 2. Although evaluation for metastatic disease is significantly limited by the absence of contrast, no edema or diffusion restriction is seen, both of which can be seen in the setting of metastatic disease. Consider repeating the exam with contrast when the patient is better able to tolerate it. Electronically Signed   By: Merilyn Baba M.D.   On: 08/02/2021 12:30   CT ABDOMEN PELVIS W CONTRAST  Addendum Date: 07/28/2021   ADDENDUM REPORT: 07/28/2021 06:35 ADDENDUM: Omitted impression #3 of ascending aortic aneurysm (measuring 4.1 cm in diameter) significance to be determined by workup of the patient's neck mass. Electronically Signed   By: Jorje Guild M.D.   On: 07/28/2021 06:35   Result Date: 07/28/2021 CLINICAL DATA:  Abdominal distension in the left upper quadrant. Abdominal pain. EXAM: CT ANGIOGRAPHY CHEST CT ABDOMEN AND PELVIS WITH CONTRAST TECHNIQUE: Multidetector CT imaging of the chest was performed using the standard protocol during bolus administration of intravenous contrast. Multiplanar CT image reconstructions and MIPs were obtained to evaluate the vascular anatomy. Multidetector CT imaging of the abdomen and pelvis was performed using the standard protocol  during bolus administration of intravenous contrast. CONTRAST:  38mL OMNIPAQUE IOHEXOL 300 MG/ML  SOLN COMPARISON:  Abdominal CT 11/28/2015 FINDINGS: CTA CHEST FINDINGS Cardiovascular: Cardiomegaly. Small to moderate low-density pericardial effusion measuring up to 11 mm in thickness. Aneurysmal ascending aorta measuring up to 4.1 cm in diameter. Mediastinum/Nodes: Mass at the  thoracic inlet and upper mediastinum as described on dedicated neck CT no lymphadenopathy seen in the mediastinum. Lungs/Pleura: Airway thickening with debris in the right sided airways there is associated with airspace opacity in the inferior and posterior right upper lobe. Mild atelectasis in the medial right middle lobe and lingula. Musculoskeletal: Scoliosis. Review of the MIP images confirms the above findings. CT ABDOMEN and PELVIS FINDINGS Hepatobiliary: No focal liver abnormality.Prominence of intrahepatic bile ducts. No calcified cholelithiasis. Pancreas: Unremarkable. Spleen: Unremarkable. Adrenals/Urinary Tract: Negative adrenals. No hydronephrosis or stone. Unremarkable bladder. Stomach/Bowel:  No obstruction. No appendicitis. 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 There is also chronic occlusion of the distal splenic vein. Numerous upper abdominal collaterals. Atheromatous calcification of the aorta and iliacs. No mass or adenopathy. Reproductive:No pathologic findings. Other: No ascites or pneumoperitoneum. Musculoskeletal: No acute abnormalities. Review of the MIP images confirms the above findings. IMPRESSION: Chest CTA: 1. Large mass at the thoracic inlet, reference neck CT. 2. Extensive debris in right-sided airways with right upper lobe opacity likely reflecting pneumonia or aspiration. Abdominal CT: 1. No acute finding. 2. 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 Electronically Signed: By: Jorje Guild M.D. On: 07/28/2021 05:56   DG  Chest Portable 1 View  Result Date: 07/31/2021 CLINICAL DATA:  55 year old female with cancer at the thoracic inlet, coughing and vomiting blood. Aspiration. EXAM: PORTABLE CHEST 1 VIEW COMPARISON:  Chest CTA 07/28/2021 and earlier. FINDINGS: Portable AP upright view at 0540 hours. Lung volumes remain normal. Heart size and central mediastinal contours remain within normal limits. Vague increased right paratracheal density corresponds to indistinct right thoracic inlet soft tissue mass seen by CT. Mild leftward deviation of the trachea but no significant tracheal narrowing. Patchy, spiculated mid right lung opacity as seen recently by CT. But otherwise allowing for portable technique the lungs are clear. No acute osseous abnormality identified. Negative visible bowel gas. IMPRESSION: 1. Patchy right mid lung opacity stable from the recent CT. 2. Vague increased right paratracheal density corresponding to large right thoracic inlet mass. 3. No new cardiopulmonary abnormality. Electronically Signed   By: Genevie Ann M.D.   On: 07/31/2021 05:52   [place] CORE BIOPSY (SOFT TISSUE)  Result Date: 07/29/2021 INDICATION: Large neck mass at the thoracic inlet favored to be advanced esophageal cancer, dysphagia EXAM: ULTRASOUND CORE BIOPSY LARGE RIGHT NECK MASS MEDICATIONS: 1% LIDOCAINE LOCAL ANESTHESIA/SEDATION: None. Total intra-service moderate Sedation Time: 0 minutes. The patient's level of consciousness and vital signs were monitored continuously by radiology nursing throughout the procedure under my direct supervision. FLUOROSCOPY TIME:  Fluoroscopy Time: None. COMPLICATIONS: None immediate. PROCEDURE: Informed written consent was obtained from the patient after a thorough discussion of the procedural risks, benefits and alternatives. All questions were addressed. Maximal Sterile Barrier Technique was utilized including caps, mask, sterile gowns, sterile gloves, sterile drape, hand hygiene and skin antiseptic. A timeout  was performed prior to the initiation of the procedure. Previous imaging reviewed. The large predominately right neck mass was localized and marked just medial to the right carotid artery. Under sterile conditions and local anesthesia, an 18 gauge core biopsy needle was advanced into the right neck mass. Needle position confirmed with ultrasound. Images obtained for documentation. 3 18 gauge core biopsies obtained. Samples were intact and non fragmented. These were placed in formalin. Needle removed. Postprocedure imaging demonstrates no hemorrhage or hematoma. Patient tolerated the biopsy well. IMPRESSION: Successful ultrasound right neck mass 18 gauge core biopsies. Electronically Signed  By: Eugenie Filler M.D.   On: 07/29/2021 13:34    PERFORMANCE STATUS (ECOG) : 3 - Symptomatic, >50% confined to bed  Review of Systems Unless otherwise noted, a complete review of systems is negative.  Physical Exam General: NAD Pulmonary: unlabored Extremities: no edema, no joint deformities Skin: no rashes Neurological: Weakness but otherwise nonfocal  IMPRESSION: Follow-up visit.  Patient is more alert today.  She endorses intractable throat/neck pain stemming from known metastatic disease.  She has been receiving frequent dosing of hydromorphone 0.5 mg (3.5 mg in past 24 hours).  Patient reports that the hydromorphone helps but is incredibly short-lived before pain is intolerable again.  She is asking for something longer acting.  We will start patient on low-dose transdermal fentanyl to provide longer acting pain control.  PLAN: -Continue current scope of treatment -Start fentanyl patch 12.5 mcg every 72 hours -Continue hydromorphone as needed for breakthrough pain -Daily bowel regimen to prevent opioid-induced constipation    Time Total: 20 minutes  Visit consisted of counseling and education dealing with the complex and emotionally intense issues of symptom management and palliative care in the  setting of serious and potentially life-threatening illness.Greater than 50%  of this time was spent counseling and coordinating care related to the above assessment and plan.  Signed by: Altha Harm, PhD, NP-C

## 2021-08-02 NOTE — OR Nursing (Signed)
Dr. Christian Mate Notified that pt is cocaine positive. MD acknowledged. No new orders at this time.

## 2021-08-02 NOTE — H&P (View-Only) (Signed)
[facility] SURGICAL PROGRESS NOTE (cpt [phone])  Hospital Day(s): 2.   Post op day(s): Day of Surgery.   Interval History: Patient seen and examined, no acute events or new complaints overnight. Patient reports she is very anxious and asking about her pain medications and when she can eat.  Apparently her last utilization of cocaine was during her interval following leaving AMA.  She tested positive this morning precluding general anesthesia unless emergent.  We all agree her feeding tube is not emergent, though her nutritional status is urgent and we will deal with that as safely as we can. She has been tolerating liquids p.o. and may resume these.   Review of Systems:  Constitutional: denies fever, chills  HEENT: + cough, + dysphagia  Respiratory: denies any shortness of breath  Cardiovascular: denies chest pain or palpitations  Gastrointestinal: denies abdominal pain, N/V Genitourinary: denies burning with urination or urinary frequency   Vital signs in last 24 hours: [min-max] current  Temp:  [97.7 F (36.5 C)-98.8 F (37.1 C)] 97.8 F (36.6 C) (11/18 0734) Pulse Rate:  [66-76] 66 (11/18 0734) Resp:  [15-20] 16 (11/18 0734) BP: (111-146)/(81-93) 128/90 (11/18 0734) SpO2:  [97 %-100 %] 98 % (11/18 0734)     Height: 5\' 1"  (154.9 cm) Weight: 40 kg BMI (Calculated): 16.67   Intake/Output last 2 shifts:  11/17 0701 - 11/18 0700 In: 3281.7 [P.O.:720; I.V.:1819.7; IV Piggyback:742] Out: 2520 [Urine:2520]   Physical Exam:  Constitutional: alert, cooperative. Tearful at times. Cachetic with muscle wasting HENT: normocephalic without obvious abnormality  Eyes: PERRL, EOM's grossly intact and symmetric  Respiratory: breathing non-labored at rest  Cardiovascular: regular rate and sinus rhythm  Gastrointestinal: soft, non-tender, and non-distended, no rebound/guarding    Labs:  CBC Latest Ref Rng & Units 08/02/2021 08/01/2021 07/31/2021  WBC 4.0 - 10.5 K/uL 5.2  4.4 4.9  Hemoglobin 12.0 - 15.0 g/dL 8.9(L) 9.1(L) 8.8(L)  Hematocrit 36.0 - 46.0 % 28.7(L) 28.9(L) 27.8(L)  Platelets 150 - 400 K/uL 262 285 309   CMP Latest Ref Rng & Units 08/02/2021 08/01/2021 07/31/2021  Glucose 70 - 99 mg/dL 105(H) 100(H) 86  BUN 6 - 20 mg/dL <5(L) <5(L) <5(L)  Creatinine 0.44 - 1.00 mg/dL 0.87 0.95 0.81  Sodium 135 - 145 mmol/L 137 136 134(L)  Potassium 3.5 - 5.1 mmol/L 3.4(L) 3.2(L) 3.8  Chloride 98 - 111 mmol/L 109 105 105  CO2 22 - 32 mmol/L 20(L) 22 23  Calcium 8.9 - 10.3 mg/dL 8.0(L) 7.9(L) 8.9  Total Protein 6.5 - 8.1 g/dL - - 8.5(H)  Total Bilirubin 0.3 - 1.2 mg/dL - - 1.3(H)  Alkaline Phos 38 - 126 U/L - - 50  AST 15 - 41 U/L - - 77(H)  ALT 0 - 44 U/L - - 26    Imaging studies: No new pertinent imaging studies   Assessment/Plan: (ICD-10's: K106.89) 55 y.o. 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  As such, we will plan on robotic assisted laparoscopic gastrostomy tube placement Monday the 21st, with Dr Christian Mate  - All risks, benefits, and alternatives to above procedure(s) were discussed with the patient and her husband at bedside, all of her questions were answered to her expressed satisfaction, patient expresses she wishes to proceed, and informed consent was obtained.  - I am okay with her continuing with very small sips of clear liquids, Ensure, and being very  meticulous for comfort; she is adamant about this. Low threshold for NPO. She needs to be NPO at midnight on Sunday for surgery Monday.   - Pain control prn  - Agree with palliative care discussion  - Appreciate oncology input; agree with feeding tube placement, recommending MRI Brain for staging completion, psychiatric evaluation for competency, and vascular surgery evaluation as this mass may be encasing large vessels  - Further management per primary service; we  will follow    I discussed with patient and with her significant other at bedside, though overall risks for feeding tube, considering her very thin abdominal wall that the risks of leakage, gastric erosion to the skin etc. is very high.  However there appears to be no other good alternative and enteric feeding will be far better for her than TPN for nutritional replenishment. I am not 1 to diminish any patient's hope that treatment will be helpful, she remains desperate to do all that we can.  All of the above findings and recommendations were discussed with the patient, patient's family (husband at bedside), and the medical team, and all of patient's and family's questions were answered to her expressed satisfaction.  -- Ronny Bacon, M.D.  [facility] 08/02/2021, 8:10 AM

## 2021-08-02 NOTE — Consult Note (Addendum)
[facility] Consult   Reason for Consult:  Alcohol dependence and depression Referring Physician:  Dr.  Blaine Hamper Patient Identification: [name] MRN:  [id number] Principal Diagnosis: Hemoptysis Diagnosis:  Principal Problem:   Hemoptysis Active Problems:   Chronic pancreatitis ([place])   Hypothyroidism   Hypertension   Nicotine dependence   Aspiration pneumonia (HCC)   Transaminitis   Dysphagia   Alcohol abuse   Squamous cell carcinoma of esophagus (HCC)   GERD (gastroesophageal reflux disease)   Macrocytic anemia   Protein-calorie malnutrition, moderate ([place])   Acute metabolic encephalopathy   Palliative care encounter   Protein-calorie malnutrition, severe   Total Time spent with patient: one hour  Subjective:   [name] is a 55 y.o. female patient admitted with esophogeal cancer, consult for polysubstance abuse and depressive symptoms.   HPI:  Patient is admitted to the hospital for hemoptysis and evaluation of esophageal mass. Psychiatry was consulted to evaluate patient's mood. She reports feeling high anxiety with daily panic attacks. Moderate depression related to medical issues and pain.  Also reports poor sleep habits and states "I'm starving". Denies any hallucinations or thoughts of harming herself or others. She states she has taken prozac in the past with relief. Currently being prescribed ativan during hospitalization. Recommend Prozac 20 mg when she has her PEG tub placement, hold until this time as she is not tolerating liquids.  She is alert and oriented times 3, agitated at times with an increase in QT interval which will be increased with Haldol IV, recommend continuation of the IV Ativan until Monday, then she can take Zyprexa 5 mg daily IM for agitation PRN or via PEG tube after placement.  She is agreeable to stay inpatient until her surgery on Monday for a PEG tube placement.  She currently has capacity to make decisions.  Past Psychiatric  History: past medical history of alcohol, and polysubstance abuse, as well as agoraphobia with panic disorder.   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    Past Surgical History:  Procedure Laterality Date   CESAREAN SECTION     GASTROSTOMY TUBE PLACEMENT  2011   TOTAL THYROIDECTOMY     Family History:  Family History  Problem Relation Age of Onset   Diabetes Mellitus II Maternal Grandfather    CAD Maternal Grandfather    Diabetes Mellitus II Mother    Liver cancer Mother    Arthritis Mother    Cancer Mother        lung   Cancer Father    Thyroid disease Son    Diabetes Mellitus II Maternal Aunt    Pancreatic cancer Paternal Aunt    Family Psychiatric  History: none Social History:  Social History   Substance and Sexual Activity  Alcohol Use Yes  Alcohol/week: 0.0 standard drinks   Comment: pt states she has a drink every day     Social History   Substance and Sexual Activity  Drug Use Yes   Types: Marijuana, Cocaine   Comment: History of drug use    Social History   Socioeconomic History   Marital status: Single    Spouse name: Not on file   Number of children: Not on file   Years of education: Not on file   Highest education level: Not on file  Occupational History   Not on file  Tobacco Use   Smoking status: Every Day    Packs/day: 0.25    Types: Cigarettes   Smokeless tobacco: Never  Vaping  Use   Vaping Use: Never used  Substance and Sexual Activity   Alcohol use: Yes    Alcohol/week: 0.0 standard drinks    Comment: pt states she has a drink every day   Drug use: Yes    Types: Marijuana, Cocaine    Comment: History of drug use   Sexual activity: Not on file  Other Topics Concern   Not on file  Social History Narrative   Not on file   Social Determinants of Health   Financial Resource Strain: High Risk   Difficulty of Paying Living Expenses: Very hard  Food Insecurity: No Food Insecurity   Worried About Charity fundraiser in the Last Year: Never true   Ran Out of Food in the Last Year: Never true  Transportation Needs: No Transportation Needs   Lack of Transportation (Medical): No   Lack of Transportation (Non-Medical): No  Physical Activity: Inactive   Days of Exercise per Week: 0 days   Minutes of Exercise per Session: 0 min  Stress: Stress Concern Present   Feeling of Stress : Very much  Social Connections: Socially Isolated   Frequency of Communication with Friends and Family: Never   Frequency of Social Gatherings with Friends and Family: Never   Attends Religious Services: Never   Marine scientist or Organizations: No   Attends Archivist Meetings: Never   Marital Status: Married   Additional Social History:    Allergies:   Allergies  Allergen Reactions   Penicillins Rash and Other (See Comments)    TOLERATED CEPHALOSPORINS Has patient had a PCN reaction causing immediate rash, facial/tongue/throat swelling, SOB or lightheadedness with hypotension: No Has patient had a PCN reaction causing severe rash involving mucus membranes or skin necrosis: No Has patient had a PCN reaction that required hospitalization No Has patient had a PCN reaction occurring within the last 10 years: No If all of the above answers are "NO", then may proceed with Cephalosporin use.    Labs:  Results for orders placed or performed during the hospital  encounter of 07/31/21 (from the past 48 hour(s))  CBC     Status: Abnormal   Collection Time: 07/31/21  2:35 PM  Result Value Ref Range   WBC 4.5 4.0 - 10.5 K/uL   RBC 3.01 (L) 3.87 - 5.11 MIL/uL   Hemoglobin 10.2 (L) 12.0 - 15.0 g/dL   HCT 31.6 (L) 36.0 - 46.0 %   MCV 105.0 (H) 80.0 - 100.0 fL   MCH 33.9 26.0 - 34.0 pg   MCHC 32.3 30.0 - 36.0 g/dL   RDW 12.9 11.5 - 15.5 %   Platelets 330 150 - 400 K/uL   nRBC 0.0 0.0 - 0.2 %    Comment: Performed at [facility], [place], [place] [address]  APTT     Status: Abnormal   Collection Time: 07/31/21  2:35 PM  Result Value Ref Range   aPTT 39 (H) 24 - 36 seconds    Comment:        IF BASELINE aPTT IS ELEVATED, SUGGEST PATIENT RISK ASSESSMENT BE USED TO DETERMINE APPROPRIATE ANTICOAGULANT THERAPY. Performed at [facility], [address] [place], [place] [address]   CBG monitoring, ED     Status: None   Collection Time: 07/31/21 10:00 PM  Result Value Ref Range   Glucose-Capillary 70 70 - 99 mg/dL    Comment: Glucose reference range applies only to samples taken after fasting for at least 8 hours.  CBC     Status: Abnormal   Collection Time: 07/31/21 11:27 PM  Result Value Ref Range   WBC 4.9 4.0 - 10.5 K/uL   RBC 2.63 (L) 3.87 - 5.11 MIL/uL   Hemoglobin 8.8 (L) 12.0 - 15.0 g/dL   HCT 27.8 (L) 36.0 - 46.0 %   MCV 105.7 (H) 80.0 - 100.0 fL   MCH 33.5 26.0 - 34.0 pg   MCHC 31.7 30.0 - 36.0 g/dL   RDW 13.1 11.5 - 15.5 %   Platelets 309 150 - 400 K/uL   nRBC 0.0 0.0 - 0.2 %    Comment: Performed at [facility], [address] [place], [place] [address]  Glucose, capillary     Status: Abnormal   Collection Time: 07/31/21 11:50 PM  Result Value Ref Range   Glucose-Capillary 63 (L) 70 - 99 mg/dL    Comment: Glucose reference range applies only to samples taken after fasting for at least 8 hours.  Glucose, capillary     Status: None   Collection Time: 08/01/21 12:28 AM  Result  Value Ref Range   Glucose-Capillary 85 70 - 99 mg/dL    Comment: Glucose reference range applies only to samples taken after fasting for at least 8 hours.  Glucose, capillary     Status: None   Collection Time: 08/01/21  5:00 AM  Result Value Ref Range   Glucose-Capillary 95 70 - 99 mg/dL    Comment: Glucose reference range applies only to samples taken after fasting for at least 8 hours.  Basic metabolic panel     Status: Abnormal   Collection Time: 08/01/21  5:14 AM  Result Value Ref Range   Sodium 136 135 - 145 mmol/L   Potassium 3.2 (L) 3.5 - 5.1 mmol/L   Chloride 105 98 - 111 mmol/L   CO2 22 22 - 32 mmol/L   Glucose, Bld 100 (H) 70 - 99 mg/dL    Comment: Glucose reference range applies only to samples taken after fasting for at least 8 hours.   BUN <5 (L) 6 - 20 mg/dL   Creatinine, Ser 0.95 0.44 - 1.00 mg/dL   Calcium 7.9 (L) 8.9 - 10.3 mg/dL   GFR, Estimated >60 >60 mL/min    Comment: (NOTE) Calculated using the CKD-EPI Creatinine Equation (2021)    Anion gap 9 5 - 15    Comment: Performed at [facility], [address] [place],  [address]  CBC     Status: Abnormal   Collection Time: 08/01/21  5:19 AM  Result Value Ref Range   WBC 4.4 4.0 - 10.5 K/uL   RBC 2.71 (L) 3.87 - 5.11 MIL/uL   Hemoglobin 9.1 (L) 12.0 - 15.0 g/dL  HCT 28.9 (L) 36.0 - 46.0 %   MCV 106.6 (H) 80.0 - 100.0 fL   MCH 33.6 26.0 - 34.0 pg   MCHC 31.5 30.0 - 36.0 g/dL   RDW 13.1 11.5 - 15.5 %   Platelets 285 150 - 400 K/uL   nRBC 0.0 0.0 - 0.2 %    Comment: Performed at [facility], [address] [place], [place] [address]  Glucose, capillary     Status: None   Collection Time: 08/01/21  8:45 AM  Result Value Ref Range   Glucose-Capillary 79 70 - 99 mg/dL    Comment: Glucose reference range applies only to samples taken after fasting for at least 8 hours.  Glucose, capillary     Status: Abnormal   Collection Time: 08/01/21 12:13 PM  Result Value Ref Range    Glucose-Capillary 110 (H) 70 - 99 mg/dL    Comment: Glucose reference range applies only to samples taken after fasting for at least 8 hours.  Glucose, capillary     Status: None   Collection Time: 08/01/21  3:29 PM  Result Value Ref Range   Glucose-Capillary 94 70 - 99 mg/dL    Comment: Glucose reference range applies only to samples taken after fasting for at least 8 hours.  Glucose, capillary     Status: Abnormal   Collection Time: 08/02/21 12:23 AM  Result Value Ref Range   Glucose-Capillary 110 (H) 70 - 99 mg/dL    Comment: Glucose reference range applies only to samples taken after fasting for at least 8 hours.  Urine Drug Screen, Qualitative ([place] only)     Status: Abnormal   Collection Time: 08/02/21  3:42 AM  Result Value Ref Range   Tricyclic, Ur Screen NONE DETECTED NONE DETECTED   Amphetamines, Ur Screen NONE DETECTED NONE DETECTED   MDMA (Ecstasy)Ur Screen NONE DETECTED NONE DETECTED   Cocaine Metabolite,Ur Ray POSITIVE (A) NONE DETECTED   Opiate, Ur Screen POSITIVE (A) NONE DETECTED   Phencyclidine (PCP) Ur S NONE DETECTED NONE DETECTED   Cannabinoid 50 Ng, Ur [place] POSITIVE (A) NONE DETECTED   Barbiturates, Ur Screen NONE DETECTED NONE DETECTED   Benzodiazepine, Ur Scrn POSITIVE (A) NONE DETECTED   Methadone Scn, Ur NONE DETECTED NONE DETECTED    Comment: (NOTE) Tricyclics + metabolites, urine    Cutoff 1000 ng/mL Amphetamines + metabolites, urine  Cutoff 1000 ng/mL MDMA (Ecstasy), urine              Cutoff 500 ng/mL Cocaine Metabolite, urine          Cutoff 300 ng/mL Opiate + metabolites, urine        Cutoff 300 ng/mL Phencyclidine (PCP), urine         Cutoff 25 ng/mL Cannabinoid, urine                 Cutoff 50 ng/mL Barbiturates + metabolites, urine  Cutoff 200 ng/mL Benzodiazepine, urine              Cutoff 200 ng/mL Methadone, urine                   Cutoff 300 ng/mL  The urine drug screen provides only a preliminary, unconfirmed analytical test result and should  not be used for non-medical purposes. Clinical consideration and professional judgment should be applied to any positive drug screen result due to possible interfering substances. A more specific alternate chemical method must be used in order to obtain a confirmed analytical  result. Gas chromatography / mass spectrometry (GC/MS) is the preferred confirm atory method. Performed at [facility], [address] [place], [place] [address]   Glucose, capillary     Status: None   Collection Time: 08/02/21  6:12 AM  Result Value Ref Range   Glucose-Capillary 92 70 - 99 mg/dL    Comment: Glucose reference range applies only to samples taken after fasting for at least 8 hours.  CBC     Status: Abnormal   Collection Time: 08/02/21  6:42 AM  Result Value Ref Range   WBC 5.2 4.0 - 10.5 K/uL   RBC 2.67 (L) 3.87 - 5.11 MIL/uL   Hemoglobin 8.9 (L) 12.0 - 15.0 g/dL   HCT 28.7 (L) 36.0 - 46.0 %   MCV 107.5 (H) 80.0 - 100.0 fL   MCH 33.3 26.0 - 34.0 pg   MCHC 31.0 30.0 - 36.0 g/dL   RDW 13.1 11.5 - 15.5 %   Platelets 262 150 - 400 K/uL   nRBC 0.0 0.0 - 0.2 %    Comment: Performed at [facility], [address], [place], [place] [address]  Basic metabolic panel     Status: Abnormal   Collection Time: 08/02/21  6:42 AM  Result Value Ref Range   Sodium 137 135 - 145 mmol/L   Potassium 3.4 (L) 3.5 - 5.1 mmol/L   Chloride 109 98 - 111 mmol/L   CO2 20 (L) 22 - 32 mmol/L   Glucose, Bld 105 (H) 70 - 99 mg/dL    Comment: Glucose reference range applies only to samples taken after fasting for at least 8 hours.   BUN <5 (L) 6 - 20 mg/dL   Creatinine, Ser 0.87 0.44 - 1.00 mg/dL   Calcium 8.0 (L) 8.9 - 10.3 mg/dL   GFR, Estimated >60 >60 mL/min    Comment: (NOTE) Calculated using the CKD-EPI Creatinine Equation (2021)    Anion gap 8 5 - 15    Comment: Performed at [facility], [address] [place], [place] [address]  Glucose, capillary     Status: None    Collection Time: 08/02/21  8:30 AM  Result Value Ref Range   Glucose-Capillary 82 70 - 99 mg/dL    Comment: Glucose reference range applies only to samples taken after fasting for at least 8 hours.    Current Facility-Administered Medications  Medication Dose Route Frequency Provider Last Rate Last Admin   cefTRIAXone (ROCEPHIN) 2 g in sodium chloride 0.9 % 100 mL IVPB  2 g Intravenous Q24H Ivor Costa, MD   Stopped at 08/02/21 0559   dextromethorphan-guaiFENesin ([place] DM) 30-600 MG per 12 hr tablet 1 tablet  1 tablet Oral BID PRN Ivor Costa, MD       dextrose 5 %-0.9 % sodium chloride infusion   Intravenous Continuous Ivor Costa, MD 100 mL/hr at 08/02/21 1107 New Bag at 08/02/21 1107   feeding supplement (ENSURE ENLIVE / ENSURE PLUS) liquid 237 mL  237 mL Oral TID BM Tylene Fantasia, PA-C   237 mL at 08/02/21 1055   FLUoxetine (PROZAC) 20 MG/5ML solution 20 mg  20 mg Oral Daily Patrecia Pour, NP       gadobutrol (GADAVIST) 1 MMOL/ML injection 4 mL  4 mL Intravenous Once PRN Earlie Server, MD       hydrALAZINE (APRESOLINE) injection 5 mg  5 mg Intravenous Q2H PRN Ivor Costa, MD       HYDROmorphone (DILAUDID) injection 0.5 mg  0.5 mg Intravenous Q3H PRN Borders, Kirt Boys, NP   0.5 mg at 08/02/21 7893   levothyroxine (SYNTHROID, LEVOTHROID) injection 100 mcg  100 mcg Intravenous Daily Ivor Costa, MD   100 mcg at 08/02/21 1052   LORazepam (ATIVAN) injection 0-4 mg  0-4 mg Intravenous Q12H Ivor Costa, MD       Or   LORazepam (ATIVAN) tablet 0-4 mg  0-4 mg Oral Q12H Ivor Costa, MD       metroNIDAZOLE (FLAGYL) IVPB 500 mg  500 mg Intravenous Q12H Dorothe Pea, RPH 100 mL/hr at 08/02/21 0853 500 mg at 08/02/21 0853   nicotine (NICODERM CQ - dosed in mg/24 hours) patch 21 mg  21 mg Transdermal Daily Ivor Costa, MD   21 mg at 08/02/21 1039   OLANZapine (ZYPREXA) injection 5 mg  5 mg Intramuscular Daily PRN Waldon Merl F, NP       pantoprazole (PROTONIX) injection 40 mg  40 mg Intravenous Huntley Dec, MD   40 mg at 08/02/21 1047   potassium chloride 10 mEq in 100 mL IVPB  10 mEq Intravenous Q1 Hr x 4 Dorothe Pea, RPH       thiamine (B-1) injection 100 mg  100 mg Intravenous Daily Ivor Costa, MD   100 mg at 08/02/21 1045    Musculoskeletal: Strength & Muscle Tone: atrophy Gait & Station:  not evaluated  Patient leans: N/A  Psychiatric Specialty Exam: Physical Exam Vitals and nursing note reviewed.  Constitutional:      Appearance: Normal appearance.  HENT:     Head: Normocephalic.     Nose: Nose normal.  Pulmonary:     Effort: Pulmonary effort is normal.  Musculoskeletal:     Cervical back: Normal range of motion.  Neurological:     General: No focal deficit present.     Mental Status: She is alert and oriented to person, place, and time.  Psychiatric:        Attention and Perception: Attention and perception normal.        Mood and Affect: Mood is anxious and depressed.        Speech: Speech normal.        Behavior: Behavior normal. Behavior is cooperative.        Thought Content: Thought content normal.        Cognition and Memory: Cognition and memory normal.        Judgment: Judgment normal.    Review of Systems  Constitutional:  Positive for malaise/fatigue.  HENT:  Positive for sore throat.   Psychiatric/Behavioral:  Positive for depression and substance abuse. The patient is nervous/anxious.   All other systems reviewed and are negative.  Blood pressure (!) 158/97, pulse 84, temperature 98.3 F (36.8 C), temperature source Oral, resp. rate 18, height 5\' 1"  (1.549 m), weight 40 kg, SpO2 100 %.Body mass index is 16.66 kg/m.  General Appearance: Disheveled  Eye Contact:  Fair  Speech:  Slow and Slurred  Volume:  Normal  Mood:  Anxious and Depressed  Affect:  Depressed and Labile  Thought Process:  Coherent  Orientation:  Full (Time, Place, and Person)  Thought Content:  Logical  Suicidal Thoughts:  No  Homicidal Thoughts:  No  Memory:  NA   Judgement:  Fair  Insight:  Fair  Psychomotor Activity:  Normal  Concentration:  Concentration: Good  Recall:  [place] of Knowledge:  Fair  Language:  Good  Akathisia:  No  Handed:  Right  AIMS (if indicated):     Assets:  Desire for Improvement  ADL's:  Intact  Cognition:  WNL  Sleep:        Physical Exam: Physical Exam Vitals and nursing note reviewed.  Constitutional:      Appearance: Normal appearance.  HENT:     Head: Normocephalic.     Nose: Nose normal.  Pulmonary:     Effort: Pulmonary effort is normal.  Musculoskeletal:     Cervical back: Normal range of motion.  Neurological:     General: No focal deficit present.     Mental Status: She is alert and oriented to person, place, and time.  Psychiatric:        Attention and Perception: Attention and perception normal.        Mood and Affect: Mood is anxious and depressed.        Speech: Speech normal.        Behavior: Behavior normal. Behavior is cooperative.        Thought Content: Thought content normal.        Cognition and Memory: Cognition and memory normal.        Judgment: Judgment normal.   Review of Systems  Constitutional:  Positive for malaise/fatigue.  HENT:  Positive for sore throat.   Psychiatric/Behavioral:  Positive for depression and substance abuse. The patient is nervous/anxious.   All other systems reviewed and are negative. Blood pressure (!) 158/97, pulse 84, temperature 98.3 F (36.8 C), temperature source Oral, resp. rate 18, height 5\' 1"  (1.549 m), weight 40 kg, SpO2 100 %. Body mass index is 16.66 kg/m.  Treatment Plan Summary: Major depressive disorder, recurrent, moderate: -Start Prozac 20 mg daily when PEG tube placed  Agitation: -Continued Zyprexa 5 mg IM daily PRN or via PEG tube PRN after placement -Started Ativan 0.5 mg every six hours PRN IV, NOT to be given with IM Zyprexa  Alcohol detox and anxiety: -Ativan protocol in place  Disposition: No evidence of  imminent risk to self or others at present.   Patient does not meet criteria for psychiatric inpatient admission.  Waylan Boga, NP 08/02/2021 12:35 PM

## 2021-08-02 NOTE — Progress Notes (Signed)
PROGRESS NOTE    [name]  YIF:[id number] DOB: [birth year] DOA: 07/31/2021 PCP: Venita Lick, NP   Brief Narrative:    HPI: [name] is a 55 y.o. female with medical history significant of chronic pancreatitis, hypothyroidism, thyroidectomy over 30 years ago, alcohol abuse, tobacco abuse, bipolar disorder, hepatitis C, nicotine dependence, COPD, neck mass with newly diagnosed esophageal squamous cell carcinoma, who presents with hemoptysis.   Patient was recently hospitalized from 11/13-11/15. Due to a large neck mass, pt had biopsy by IR, which showed squamous cell carcinoma of the esophagus.  Patient also had aspiration pneumonia, and treated with Rocephin and Flagyl, but the patient left hospital AMA. Per her husband, pt continues to cough with dark red bloody material coming out, patient thinks that the blood is from coughing. She also has shortness of breath, no fever or chills.  She complains of pain in neck, throat, right upper chest and right shoulder area.  Per husband, patient has not been able to eat in the past several days.  She does not have nausea, vomiting, diarrhea or abdominal pain.  No symptoms of UTI.   ED Course: pt was found to have WBC 4.1, INR 1.1, ammonia level 17, negative COVID PCR, alcohol level less than 10, abnormal liver function (ALP 50, AST 77, ALT 26, total bilirubin 1.3), electrolytes renal function okay, magnesium 2.1, temperature normal, blood pressure 157/88, heart rate 82, RR 18, oxygen saturation 98% on room air.  Patient is placed on MedSurg bed for observation, Dr. Tasia Catchings of oncology is consulted.  Assessment & Plan:   Principal Problem:   Hemoptysis Active Problems:   Chronic pancreatitis (HCC)   Hypothyroidism   Hypertension   Nicotine dependence   Aspiration pneumonia (HCC)   Transaminitis   Dysphagia   Alcohol abuse   Squamous cell carcinoma of esophagus (HCC)   GERD (gastroesophageal reflux disease)   Macrocytic anemia    Protein-calorie malnutrition, moderate (HCC)   Acute metabolic encephalopathy   Palliative care encounter   Protein-calorie malnutrition, severe  Hemoptysis vs. hematemesis/squamous cell carcinoma of esophagus: No further episodes of hematemesis or hemoptysis since admission.  Hemoglobin remained stable.  Continue Protonix IV twice daily.  Monitor hemoglobin daily.  Oncology on board.  Defer to them for definitive therapy of her cancer.  Per oncology request, I consulted vascular surgery.  They reviewed all the images, saw patient in consultation and discussed with the patient and there is no current need of embolization.  MRI brain ordered for staging however patient has refused that.   Dysphagia due to squamous cell carcinoma of esophagus: CEA 8.1 General surgery on board.  Plan was to place G-tube robotic way however patient's urine turned out to be positive for cocaine so anesthesia did not feel comfortable.  This has been deferred until Monday pending negative cocaine in the urine.  Hypokalemia: Low again.  Will replace.   Aspiration pneumonia: Chest x-ray negative, no fever and no leukocytosis this admission however during recent aspiration, she was being treated for aspiration pneumonia, she had received only 2 days of antibiotics and she left AGAINST MEDICAL ADVICE.  We will continue antibiotics for total of 5 days during this hospitalization.   Hypothyroidism:  pt is supposed to take Synthroid at 150 mcg daily, but did not take it this medication for several months.  TSH elevated to 287 on 07/30/2021 with free T4 < 0.25.  Although patient has slow in response but patient's temperature normal, heart rate 82,  oriented x 3, does not seem to have myxedema.  She has been started on Synthroid 100 mcg IV daily.  Chronic pancreatitis ([place]): -No acute issues.   Hypertension: not taking medications currently.  On as needed hydralazine, blood pressure controlled for most part.   Nicotine  dependence and alcohol abuse -Nicotine patch -CiWA protocol.  No signs of alcohol withdrawal at this point in time.  Per her significant other, her last drink was 3 days before admission.   Elevated LFTs: ALT AST elevated, indicating alcoholic liver disease.  Monitor.   GERD (gastroesophageal reflux disease) -on IV protonix    Macrocytic anemia: Likely secondary to chronic alcohol abuse, B12 and folate normal during recent hospitalization.  Hemoglobin stable.  Monitor.     Severe protein calorie malnutrition: BMI 16.6. General surgery on board for G-tube placement.   Bipolar disorder: Psychiatry on board.   Acute metabolic encephalopathy: Patient is more alert today.  Oriented x3.  She was likely slow secondary to severe hypothyroidism which is improving now that she is on IV Synthroid.   DVT prophylaxis: SCDs Start: 07/31/21 1009   Code Status: Full Code  Family Communication: Patient's significant other is present at bedside. Status is: Inpatient  Remains inpatient appropriate because: Needs further investigation and management of her several medical issues.   Estimated body mass index is 16.66 kg/m as calculated from the following:   Height as of this encounter: 5\' 1"  (1.549 m).   Weight as of this encounter: 40 kg.     Nutritional Assessment: Body mass index is 16.66 kg/m.Marland Kitchen Seen by dietician.  I agree with the assessment and plan as outlined below: Nutrition Status: Nutrition Problem: Severe Malnutrition Etiology: chronic illness (esophageal cancer) Skin Assessment: I have examined the patient's skin and I agree with the wound assessment as performed by the wound care RN as outlined below:    Consultants:  General surgery IR-signed off Oncology Vascular surgery-signed off Psychiatry Palliative care  Procedures:  None  Antimicrobials:  Anti-infectives (From admission, onward)    Start     Dose/Rate Route Frequency Ordered Stop   08/01/21 2125   metroNIDAZOLE (FLAGYL) IVPB 500 mg        500 mg 100 mL/hr over 60 Minutes Intravenous Every 12 hours 08/01/21 1049 08/06/21 2129   08/01/21 0600  cefTRIAXone (ROCEPHIN) 2 g in sodium chloride 0.9 % 100 mL IVPB        2 g 200 mL/hr over 30 Minutes Intravenous Every 24 hours 07/31/21 1036 08/08/21 0559   07/31/21 1400  metroNIDAZOLE (FLAGYL) IVPB 500 mg  Status:  Discontinued        500 mg 100 mL/hr over 60 Minutes Intravenous Every 8 hours 07/31/21 1036 08/01/21 1049   07/31/21 0600  cefTRIAXone (ROCEPHIN) 1 g in sodium chloride 0.9 % 100 mL IVPB        1 g 200 mL/hr over 30 Minutes Intravenous  Once 07/31/21 0553 07/31/21 0638   07/31/21 0600  metroNIDAZOLE (FLAGYL) IVPB 500 mg        500 mg 100 mL/hr over 60 Minutes Intravenous  Once 07/31/21 0553 07/31/21 0707          Subjective:  Patient seen and examined.  Significant other at the bedside.  Patient once again complains of pain in the back and is asking for more more pain medications.  She is already on high-dose of Dilaudid.  Significant other tells me that the oncologist saw the patient again today and they were  considering fentanyl.    Objective: Vitals:   08/02/21 0325 08/02/21 0734 08/02/21 0828 08/02/21 1229  BP: (!) 146/92 128/90 (!) 166/89 (!) 158/97  Pulse: 74 66 78 84  Resp: 15 16 18 18   Temp: 97.9 F (36.6 C) 97.8 F (36.6 C) 97.9 F (36.6 C) 98.3 F (36.8 C)  TempSrc: Oral Temporal Oral Oral  SpO2: 100% 98% 100% 100%  Weight:      Height:        Intake/Output Summary (Last 24 hours) at 08/02/2021 1252 Last data filed at 08/02/2021 1230 Gross per 24 hour  Intake 3281.72 ml  Output 1750 ml  Net 1531.72 ml    Filed Weights   07/31/21 0532  Weight: 40 kg    Examination:  General exam: Appears calm and comfortable, cachectic Respiratory system: Clear to auscultation. Respiratory effort normal. Cardiovascular system: S1 & S2 heard, RRR. No JVD, murmurs, rubs, gallops or clicks. No pedal  edema. Gastrointestinal system: Abdomen is nondistended, soft and nontender. No organomegaly or masses felt. Normal bowel sounds heard. Central nervous system: Alert and oriented. No focal neurological deficits. Extremities: Symmetric 5 x 5 power. Skin: No rashes, lesions or ulcers.  Psychiatry: Judgement and insight appear poor   Data Reviewed: I have personally reviewed following labs and imaging studies  CBC: Recent Labs  Lab 07/27/21 0144 07/29/21 0629 07/31/21 0542 07/31/21 1435 07/31/21 2327 08/01/21 0519 08/02/21 0642  WBC 7.7   < > 4.1 4.5 4.9 4.4 5.2  NEUTROABS 6.7  --  2.8  --   --   --   --   HGB 11.9*   < > 11.0* 10.2* 8.8* 9.1* 8.9*  HCT 36.3   < > 33.2* 31.6* 27.8* 28.9* 28.7*  MCV 104.3*   < > 104.1* 105.0* 105.7* 106.6* 107.5*  PLT 407*   < > 338 330 309 285 262   < > = values in this interval not displayed.    Basic Metabolic Panel: Recent Labs  Lab 07/28/21 0144 07/29/21 0629 07/30/21 0451 07/31/21 0542 08/01/21 0514 08/02/21 0642  NA  --  135 136 134* 136 137  K  --  3.6 3.9 3.8 3.2* 3.4*  CL  --  107 110 105 105 109  CO2  --  22 23 23 22  20*  GLUCOSE  --  151* 139* 86 100* 105*  BUN  --  13 7 <5* <5* <5*  CREATININE  --  0.81 0.66 0.81 0.95 0.87  CALCIUM  --  8.4* 8.2* 8.9 7.9* 8.0*  MG 2.6*  --   --  2.1  --   --     GFR: Estimated Creatinine Clearance: 45.6 mL/min (by C-G formula based on SCr of 0.87 mg/dL). Liver Function Tests: Recent Labs  Lab 07/27/21 0144 07/30/21 0451 07/31/21 0542  AST 151* 71* 77*  ALT 41 26 26  ALKPHOS 53 42 50  BILITOT 1.4* 0.9 1.3*  PROT 10.0* 7.6 8.5*  ALBUMIN 4.8 3.5 3.9    Recent Labs  Lab 07/27/21 0144  LIPASE 26    Recent Labs  Lab 07/31/21 0617  AMMONIA 17    Coagulation Profile: Recent Labs  Lab 07/29/21 0613 07/31/21 0542  INR 1.1 1.1    Cardiac Enzymes: No results for input(s): CKTOTAL, CKMB, CKMBINDEX, TROPONINI in the last 168 hours. BNP (last 3 results) No results for  input(s): PROBNP in the last 8760 hours. HbA1C: No results for input(s): HGBA1C in the last 72 hours. CBG: Recent Labs  Lab 08/01/21 1529 08/02/21 0023 08/02/21 0612 08/02/21 0830 08/02/21 1235  GLUCAP 94 110* 92 82 79    Lipid Profile: No results for input(s): CHOL, HDL, LDLCALC, TRIG, CHOLHDL, LDLDIRECT in the last 72 hours. Thyroid Function Tests: No results for input(s): TSH, T4TOTAL, FREET4, T3FREE, THYROIDAB in the last 72 hours.  Anemia Panel: No results for input(s): VITAMINB12, FOLATE, FERRITIN, TIBC, IRON, RETICCTPCT in the last 72 hours.  Sepsis Labs: No results for input(s): PROCALCITON, LATICACIDVEN in the last 168 hours.  Recent Results (from the past 240 hour(s))  Resp Panel by RT-PCR (Flu A&B, Covid) Nasopharyngeal Swab     Status: None   Collection Time: 07/28/21 10:07 AM   Specimen: Nasopharyngeal Swab; Nasopharyngeal(NP) swabs in vial transport medium  Result Value Ref Range Status   SARS Coronavirus 2 by RT PCR NEGATIVE NEGATIVE Final    Comment: (NOTE) SARS-CoV-2 target nucleic acids are NOT DETECTED.  The SARS-CoV-2 RNA is generally detectable in upper respiratory specimens during the acute phase of infection. The lowest concentration of SARS-CoV-2 viral copies this assay can detect is 138 copies/mL. A negative result does not preclude SARS-Cov-2 infection and should not be used as the sole basis for treatment or other patient management decisions. A negative result may occur with  improper specimen collection/handling, submission of specimen other than nasopharyngeal swab, presence of viral mutation(s) within the areas targeted by this assay, and inadequate number of viral copies(<138 copies/mL). A negative result must be combined with clinical observations, patient history, and epidemiological information. The expected result is Negative.  Fact Sheet for Patients:  EntrepreneurPulse.com.au  Fact Sheet for Healthcare Providers:   IncredibleEmployment.be  This test is no t yet approved or cleared by the [place] FDA and  has been authorized for detection and/or diagnosis of SARS-CoV-2 by FDA under an Emergency Use Authorization (EUA). This EUA will remain  in effect (meaning this test can be used) for the duration of the COVID-19 declaration under Section 564(b)(1) of the Act, 21 U.S.C.section 360bbb-3(b)(1), unless the authorization is terminated  or revoked sooner.       Influenza A by PCR NEGATIVE NEGATIVE Final   Influenza B by PCR NEGATIVE NEGATIVE Final    Comment: (NOTE) The Xpert Xpress SARS-CoV-2/FLU/RSV plus assay is intended as an aid in the diagnosis of influenza from Nasopharyngeal swab specimens and should not be used as a sole basis for treatment. Nasal washings and aspirates are unacceptable for Xpert Xpress SARS-CoV-2/FLU/RSV testing.  Fact Sheet for Patients: EntrepreneurPulse.com.au  Fact Sheet for Healthcare Providers: IncredibleEmployment.be  This test is not yet approved or cleared by the [place] FDA and has been authorized for detection and/or diagnosis of SARS-CoV-2 by FDA under an Emergency Use Authorization (EUA). This EUA will remain in effect (meaning this test can be used) for the duration of the COVID-19 declaration under Section 564(b)(1) of the Act, 21 U.S.C. section 360bbb-3(b)(1), unless the authorization is terminated or revoked.  Performed at [facility], [place], [place] [address]   Expectorated Sputum Assessment w Gram Stain, Rflx to Resp Cult     Status: None   Collection Time: 07/31/21  4:15 AM   Specimen: Expectorated Sputum  Result Value Ref Range Status   Specimen Description EXPECTORATED SPUTUM  Final   Special Requests NONE  Final   Sputum evaluation   Final    Sputum specimen not acceptable for testing.  Please recollect.   [name]  08/01/21 @ 0545 BY  SB  Performed at [facility], [address] [place], [place] [address]    Report Status 08/01/2021 FINAL  Final  Resp Panel by RT-PCR (Flu A&B, Covid) Nasopharyngeal Swab     Status: None   Collection Time: 07/31/21  5:42 AM   Specimen: Nasopharyngeal Swab; Nasopharyngeal(NP) swabs in vial transport medium  Result Value Ref Range Status   SARS Coronavirus 2 by RT PCR NEGATIVE NEGATIVE Final    Comment: (NOTE) SARS-CoV-2 target nucleic acids are NOT DETECTED.  The SARS-CoV-2 RNA is generally detectable in upper respiratory specimens during the acute phase of infection. The lowest concentration of SARS-CoV-2 viral copies this assay can detect is 138 copies/mL. A negative result does not preclude SARS-Cov-2 infection and should not be used as the sole basis for treatment or other patient management decisions. A negative result may occur with  improper specimen collection/handling, submission of specimen other than nasopharyngeal swab, presence of viral mutation(s) within the areas targeted by this assay, and inadequate number of viral copies(<138 copies/mL). A negative result must be combined with clinical observations, patient history, and epidemiological information. The expected result is Negative.  Fact Sheet for Patients:  EntrepreneurPulse.com.au  Fact Sheet for Healthcare Providers:  IncredibleEmployment.be  This test is no t yet approved or cleared by the [place] FDA and  has been authorized for detection and/or diagnosis of SARS-CoV-2 by FDA under an Emergency Use Authorization (EUA). This EUA will remain  in effect (meaning this test can be used) for the duration of the COVID-19 declaration under Section 564(b)(1) of the Act, 21 U.S.C.section 360bbb-3(b)(1), unless the authorization is terminated  or revoked sooner.       Influenza A by PCR NEGATIVE NEGATIVE Final   Influenza B by PCR NEGATIVE NEGATIVE Final     Comment: (NOTE) The Xpert Xpress SARS-CoV-2/FLU/RSV plus assay is intended as an aid in the diagnosis of influenza from Nasopharyngeal swab specimens and should not be used as a sole basis for treatment. Nasal washings and aspirates are unacceptable for Xpert Xpress SARS-CoV-2/FLU/RSV testing.  Fact Sheet for Patients: EntrepreneurPulse.com.au  Fact Sheet for Healthcare Providers: IncredibleEmployment.be  This test is not yet approved or cleared by the [place] FDA and has been authorized for detection and/or diagnosis of SARS-CoV-2 by FDA under an Emergency Use Authorization (EUA). This EUA will remain in effect (meaning this test can be used) for the duration of the COVID-19 declaration under Section 564(b)(1) of the Act, 21 U.S.C. section 360bbb-3(b)(1), unless the authorization is terminated or revoked.  Performed at [facility], [address], [place], [place] [address]        Radiology Studies: CT HEAD WO CONTRAST (5MM)  Result Date: 08/01/2021 CLINICAL DATA:  Delirium.  Esophageal cancer EXAM: CT HEAD WITHOUT CONTRAST TECHNIQUE: Contiguous axial images were obtained from the base of the skull through the vertex without intravenous contrast. COMPARISON:  None. FINDINGS: Brain: No evidence of acute infarction, hemorrhage, hydrocephalus, extra-axial collection or mass lesion/mass effect. Vascular: Negative for hyperdense vessel Skull: Negative Sinuses/Orbits: Negative Other: None IMPRESSION: Negative CT head Electronically Signed   By: Franchot Gallo M.D.   On: 08/01/2021 09:29   MR BRAIN WO CONTRAST  Result Date: 08/02/2021 CLINICAL DATA:  Gastrointestinal cancer, staging EXAM: MRI HEAD WITHOUT CONTRAST TECHNIQUE: Multiplanar, multiecho pulse sequences of the brain and surrounding structures were obtained without intravenous contrast. COMPARISON:  CT head 08/01/2021. FINDINGS: Evaluation is limited as the exam is incomplete  and no intravenous contrast was given. Brain: No  restricted diffusion to suggest acute infarct; metastatic disease could also restrict diffusion. No acute hemorrhage, significant edema mass, mass effect, or midline shift. Minimal T2 hyperintense signal in the periventricular white matter, likely the sequela of chronic small vessel ischemic disease. Vascular: Normal flow voids. Skull and upper cervical spine: No diffusion restricting lesions in the imaged osseous structures. Sinuses/Orbits: Negative. Other: Fluid in right mastoid air cells. IMPRESSION: 1. Limited evaluation secondary to early termination exam. Within this limitation, no acute intracranial process. 2. Although evaluation for metastatic disease is significantly limited by the absence of contrast, no edema or diffusion restriction is seen, both of which can be seen in the setting of metastatic disease. Consider repeating the exam with contrast when the patient is better able to tolerate it. Electronically Signed   By: Merilyn Baba M.D.   On: 08/02/2021 12:30    Scheduled Meds:  feeding supplement  237 mL Oral TID BM   FLUoxetine  20 mg Oral Daily   levothyroxine  100 mcg Intravenous Daily   LORazepam  0-4 mg Intravenous Q12H   Or   LORazepam  0-4 mg Oral Q12H   nicotine  21 mg Transdermal Daily   pantoprazole (PROTONIX) IV  40 mg Intravenous Q12H   thiamine  100 mg Intravenous Daily   Continuous Infusions:  cefTRIAXone (ROCEPHIN)  IV Stopped (08/02/21 0559)   dextrose 5 % and 0.9% NaCl 100 mL/hr at 08/02/21 1107   metronidazole 500 mg (08/02/21 0853)   potassium chloride 10 mEq (08/02/21 1246)     LOS: 2 days   Time spent: 30 minutes   Darliss Cheney, MD [facility]  08/02/2021, 12:52 PM  Please page via [facility] and do not message via secure chat for anything urgent. Secure chat can be used for anything non urgent.  How to contact the [facility] Attending or Consulting provider [place] or covering provider during after hours  Register, for this patient?  Check the care team in [facility] and look for a) attending/consulting TR[facility] provider listed and b) the [facility] team listed. Page or secure chat [phone]. Log into [email or web address] and use [place]'s universal password to access. If you do not have the password, please contact the hospital operator. Locate the [facility] provider you are looking for under [facility] and page to a number that you can be directly reached. If you still have difficulty reaching the provider, please page the [facility] (Director on Call) for the Hospitalists listed on amion for assistance.

## 2021-08-02 NOTE — Progress Notes (Addendum)
Report given to OR RN. Informed patient will be going to OR around 0630 this AM. Will obtain urine drug screen as since been unable d/t contaminated samples. Signed consent present in chart.   Patient has continued to refuse MRI overnight. MRI aware.   0347 Urine tox screen collected and sent to lab.

## 2021-08-02 NOTE — TOC Initial Note (Signed)
Transition of Care [facility]) - Initial/Assessment Note    Patient Details  Name: [name] MRN: [id number] Date of Birth: 06-30-1966  Transition of Care [facility] CM/SW Contact:    Pete Pelt, RN Phone Number: 08/02/2021, 1:27 PM  Clinical Narrative:  Patient was too ill to speak with TOC for long, had a brief conversation about Substance Abuse resources, patient accepted the information, and stated will review.  Asked to return, TOC to follow for needs.                       Patient Goals and CMS Choice        Expected Discharge Plan and Services                                                Prior Living Arrangements/Services                       Activities of Daily Living Home Assistive Devices/Equipment: Cane (specify quad or straight), Walker (specify type) ADL Screening (condition at time of admission) Patient's cognitive ability adequate to safely complete daily activities?: Yes Is the patient deaf or have difficulty hearing?: No Does the patient have difficulty seeing, even when wearing glasses/contacts?: No Does the patient have difficulty concentrating, remembering, or making decisions?: No Patient able to express need for assistance with ADLs?: Yes Does the patient have difficulty dressing or bathing?: No Independently performs ADLs?: Yes (appropriate for developmental age) Does the patient have difficulty walking or climbing stairs?: Yes Weakness of Legs: Both Weakness of Arms/Hands: None  Permission Sought/Granted                  Emotional Assessment              Admission diagnosis:  Prolonged Q-T interval on ECG [R94.31] Esophageal mass [K22.89] Hemoptysis [R04.2] Aspiration pneumonia of both lungs, unspecified aspiration pneumonia type, unspecified part of lung ([place]) [J69.0] Patient Active Problem List   Diagnosis Date Noted   Acute metabolic encephalopathy 54/65/0354   Protein-calorie malnutrition, severe  08/01/2021   Palliative care encounter    Hemoptysis 07/31/2021   Alcohol abuse 07/31/2021   Squamous cell carcinoma of esophagus ([place]) 07/31/2021   GERD (gastroesophageal reflux disease) 07/31/2021   Macrocytic anemia 07/31/2021   Protein-calorie malnutrition, moderate ([place]) 07/31/2021   Prolonged Q-T interval on ECG    Aspiration pneumonia ([place]) 07/28/2021   Hypoglycemia 07/28/2021   Transaminitis 07/28/2021   Dysphagia 07/28/2021   Neck mass 07/28/2021   Hypomagnesemia 07/28/2021   Hypokalemia 07/28/2021   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   Chronic pain syndrome 07/20/2015   COPD (chronic obstructive pulmonary disease) ([place]) 07/20/2015   Hepatitis C 07/20/2015   Alcohol use disorder, severe, dependence ([place]) 03/22/2015   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

## 2021-08-02 NOTE — Progress Notes (Signed)
Nutrition Follow-up  DOCUMENTATION CODES:   Underweight, Severe malnutrition in context of chronic illness  INTERVENTION:   -Advance to full liquid diet; received verbal permission from general surgery -Ensure Enlive po TID, each supplement provides 350 kcal and 20 grams of protein  -Once g-tube is placed, recommend:   Initiate Osmolite 1.2 @ 20 ml/hr and increase by 10 ml every 8 hours to goal rate of 60 ml/hr.    105 ml free water flush every 6 hours   Tube feeding regimen provides 1728 kcal (100% of needs), 80 grams of protein, and 1181 ml of H2O. Total free water: 1601 ml daily   -Once able to demonstrate tolerance of continuous TF, recommend transition to bolus feeds:   355 ml Osmolite 1.2 via PEG 4 times daily   55 ml free water flush before and after each feeding administration   Tube feeding regimen provides 1710 kcal (100% of needs), 79 grams of protein, and 1170 ml of H2O.  Total free water: 1610 ml   -Once feedings are initiated, monitor Mg, K, and Phos daily and replete as needed due to high refeeding risk   NUTRITION DIAGNOSIS:   Severe Malnutrition related to chronic illness (esophageal cancer) as evidenced by energy intake < or equal to 75% for > or equal to 1 month, severe fat depletion, severe muscle depletion, percent weight loss.  Ongoing  GOAL:   Patient will meet greater than or equal to 90% of their needs  Progressing   MONITOR:   Diet advancement, Labs, Weight trends, Skin, I & O's  REASON FOR ASSESSMENT:   Malnutrition Screening Tool    ASSESSMENT:   [name] is a 55 y.o. female with medical history significant of chronic pancreatitis, hypothyroidism, thyroidectomy over 30 years ago, alcohol abuse, tobacco abuse, bipolar disorder, hepatitis C, nicotine dependence, COPD, neck mass with newly diagnosed esophageal squamous cell carcinoma, who presents with hemoptysis.  Reviewed I/O's: +762 ml x 24 hours and +2.3 L since admission   Per  general surgery notes, pt tested positive for cocaine this morning, which precludes use of general anesthesia unless emergent. PEG placement has been rescheduled for Monday, 08/05/21. Per MD, pt can resume liquids.   Pt was on clear liquids yesterday and very upset about the lack of options offered to her. Case discussed with general surgery, who gave RD permission to advance diet to full liquids. MD is hesitant to upgrade diet above full liquids at this time.   Medications reviewed and include ativan and thiamine.   Labs reviewed: K: 3.4 (on IV supplementation).    Diet Order:   Diet Order             Diet full liquid Room service appropriate? Yes; Fluid consistency: Thin  Diet effective now                   EDUCATION NEEDS:   Education needs have been addressed  Skin:  Skin Assessment: Skin Integrity Issues: Skin Integrity Issues:: Incisions Incisions: closed rt throat s/p biopsy  Last BM:  Unknown  Height:   Ht Readings from Last 1 Encounters:  07/31/21 5\' 1"  (1.549 m)    Weight:   Wt Readings from Last 1 Encounters:  07/31/21 40 kg    Ideal Body Weight:  47.7 kg  BMI:  Body mass index is 16.66 kg/m.  Estimated Nutritional Needs:   Kcal:  1600-1800  Protein:  80-95 grams  Fluid:  > 1.6 L  Loistine Chance, RD, LDN, [place] Registered Dietitian II Certified Diabetes Care and Education Specialist Please refer to [facility] for RD and/or RD on-call/weekend/after hours pager

## 2021-08-03 DIAGNOSIS — F1910 Other psychoactive substance abuse, uncomplicated: Secondary | ICD-10-CM

## 2021-08-03 LAB — CBC
HCT: 31 % — ABNORMAL LOW (ref 36.0–46.0)
Hemoglobin: 9.6 g/dL — ABNORMAL LOW (ref 12.0–15.0)
MCH: 33.1 pg (ref 26.0–34.0)
MCHC: 31 g/dL (ref 30.0–36.0)
MCV: 106.9 fL — ABNORMAL HIGH (ref 80.0–100.0)
Platelets: 255 10*3/uL (ref 150–400)
RBC: 2.9 MIL/uL — ABNORMAL LOW (ref 3.87–5.11)
RDW: 13 % (ref 11.5–15.5)
WBC: 6.9 10*3/uL (ref 4.0–10.5)
nRBC: 0 % (ref 0.0–0.2)

## 2021-08-03 LAB — GLUCOSE, CAPILLARY
Glucose-Capillary: 108 mg/dL — ABNORMAL HIGH (ref 70–99)
Glucose-Capillary: 121 mg/dL — ABNORMAL HIGH (ref 70–99)
Glucose-Capillary: 91 mg/dL (ref 70–99)

## 2021-08-03 LAB — BASIC METABOLIC PANEL
Anion gap: 5 (ref 5–15)
BUN: <5 mg/dL — ABNORMAL LOW (ref 6–20)
CO2: 24 mmol/L (ref 22–32)
Calcium: 8.2 mg/dL — ABNORMAL LOW (ref 8.9–10.3)
Chloride: 107 mmol/L (ref 98–111)
Creatinine, Ser: 0.72 mg/dL (ref 0.44–1.00)
GFR, Estimated: >60 mL/min (ref >60)
Glucose, Bld: 131 mg/dL — ABNORMAL HIGH (ref 70–99)
Potassium: 3.4 mmol/L — ABNORMAL LOW (ref 3.5–5.1)
Sodium: 136 mmol/L (ref 135–145)

## 2021-08-03 MED ORDER — MORPHINE SULFATE (PF) 4 MG/ML IV SOLN
3.0000 mg | INTRAVENOUS | Status: DC | PRN
  Administered 2021-08-03 – 2021-08-05 (×14): 3 mg via INTRAVENOUS
  Filled 2021-08-03 (×15): qty 1

## 2021-08-03 NOTE — Anesthesia Preprocedure Evaluation (Addendum)
Anesthesia Evaluation  Patient identified by MRN, date of birth, ID band Patient awake    Reviewed: Allergy & Precautions, NPO status , Patient's Chart, lab work & pertinent test results  History of Anesthesia Complications Negative for: history of anesthetic complications  Airway Mallampati: IV     Mouth opening: Limited Mouth Opening  Dental  (+) Poor Dentition   Pulmonary asthma , pneumonia (2/2 aspiration 07/28/21), COPD, Current Smoker (1/2 ppd) and Patient abstained from smoking.,    Pulmonary exam normal breath sounds clear to auscultation       Cardiovascular hypertension, Normal cardiovascular exam Rhythm:Regular Rate:Normal  ECG 07/31/21:  Sinus rhythm Low voltage, extremity leads Nonspecific T abnormalities, anterior leads Prolonged QT interval   Neuro/Psych Seizures - (none in several years),  PSYCHIATRIC DISORDERS Depression Bipolar Disorder Polysubstance use disorder including alcohol, cocaine, marijuana, last use of all 07/30/21; chronic pain    GI/Hepatic (+) Hepatitis -, CEsophageal cancer   Endo/Other  Hypothyroidism   Renal/GU      Musculoskeletal   Abdominal   Peds  Hematology   Anesthesia Other Findings Inpatient since 07/31/21  Reproductive/Obstetrics                           Anesthesia Physical Anesthesia Plan  ASA: 3  Anesthesia Plan: General   Post-op Pain Management:    Induction: Intravenous  PONV Risk Score and Plan: 2 and Ondansetron, Dexamethasone and Treatment may vary due to age or medical condition  Airway Management Planned: Oral ETT  Additional Equipment:   Intra-op Plan:   Post-operative Plan: Extubation in OR  Informed Consent: I have reviewed the patients History and Physical, chart, labs and discussed the procedure including the risks, benefits and alternatives for the proposed anesthesia with the patient or authorized representative  who has indicated his/her understanding and acceptance.     Dental advisory given and Consent reviewed with POA  Plan Discussed with:   Anesthesia Plan Comments: (Patient and POA consented for risks of anesthesia including but not limited to:  - adverse reactions to medications - damage to eyes, teeth, lips or other oral mucosa - nerve damage due to positioning  - sore throat or hoarseness - damage to heart, brain, nerves, lungs, other parts of body or loss of life  Informed patient and POA about role of CRNA in peri- and intra-operative care.  Patient voiced understanding.)       Anesthesia Quick Evaluation

## 2021-08-03 NOTE — Progress Notes (Signed)
PROGRESS NOTE  ALEXANDRE LIGHTSEY UUE:[id number] DOB: August 26, 1966 DOA: 07/31/2021 PCP: Venita Lick, NP  HPI/Recap of past 60 hours: 55 year old female with past medical history for chronic pancreatitis, polysubstance abuse including tobacco, alcohol and cocaine, bipolar disorder, hepatitis C and COPD who was hospitalized from 11/13-2011/15 for evaluation of large neck mass and biopsy at that time noted squamous cell carcinoma of the esophagus as well as patient being treated for aspiration pneumonia.  Patient left AMA on 11/15 but returned on 11/16 with worsening cough.  Admitted to the hospitalist service.  Oncology consulted.  Initial plan was for general surgery to place G-tube secondary to dysphagia however patient's urine positive for cocaine, so surgery deferred until Monday, 11/21 given time for cocaine to clear patient's system.  Patient complains of pain all the time, and her neck.  Assessment/Plan: Principal Problem:   Hemoptysis Active Problems:   Chronic pancreatitis ([place]): Stable at this time.    Hypothyroidism: Normally on 150 mcg of Synthroid, but has not taken for several months.  Started on IV Synthroid and patient seems to be slowly improving.  Does not seem to have myxedema.    Hypertension: As needed hydralazine.  Not on any home medications.    Aspiration pneumonia [facility]): Received several more days of antibiotics to complete 5-day course for previous aspiration.  Will need G-tube for preventing further aspirations    Transaminitis: In the context of patient's alcohol and hepatitis C liver disease.  Monitoring.    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  Appreciate oncology help.  Plan is for concurrent chemotherapy and radiation, but waiting until acute encephalopathy improved and feeding tube placed.  Vascular surgery reviewed films and tumor is encasing great vessels are not small branch that could be  prophylactically embolized.  Therefore, no intervention at this point.  Limited exam and patient does not want to repeat MRI of brain, but no evidence of brain metastases    GERD (gastroesophageal reflux disease)   Macrocytic anemia: Secondary to chronic alcohol abuse.  Stable.    Acute metabolic encephalopathy: Looks to have resolved, oriented x3.  Felt to be secondary to noncompliance with thyroid medication and improved now that IV Synthroid has been started.    Protein-calorie malnutrition, severe: Nutrition Status: Nutrition Problem: Severe Malnutrition Etiology: chronic illness (esophageal cancer) Signs/Symptoms: energy intake < or equal to 75% for > or equal to 1 month, severe fat depletion, severe muscle depletion, percent weight loss Percent weight loss: 20.5 % Interventions: Refer to RD note for recommendations  Polysubstance abuse including cocaine/alcohol/tobacco/benzodiazepines: Counseled.  Nicotine patch.  Alcohol level normal this admission, last drink was 3 days prior.  Still placing on CIWA protocol.  Waiting for cocaine to clear her system so that patient can have surgery on Monday.  She continues to complain of pain although it is difficult to try to ascertain what is related to her addiction versus what is true pain.  She agreed to changing her Dilaudid to morphine at the equivalent dose and same frequency so that it would stay in her system longer.    Goals of care, counseling/discussion: Palliative care following.  Appreciate helping to manage symptoms  Anxiety/depression: Appreciate psychiatry help.  Started on Zyprexa and SSRI   Code Status: Full code  Family Communication: Husband at the bedside  Disposition Plan: To be determined following G-tube placement once tube feeds have been established and oncology plan started   Consultants: Palliative care Interventional  radiology General surgery Oncology Vascular surgery Psychiatry  Procedures: Plan G-tube  placement 11/21  Antimicrobials: IV Rocephin 11/16-11/24 IV Flagyl 11/16-11/22  DVT prophylaxis: SCDs  Level of care: Med-Surg   Objective: Vitals:   08/03/21 0552 08/03/21 0749  BP: (!) 145/86 (!) 150/100  Pulse: 82 81  Resp: 18 18  Temp: 98.4 F (36.9 C) 98.7 F (37.1 C)  SpO2: 99% 95%    Intake/Output Summary (Last 24 hours) at 08/03/2021 1002 Last data filed at 08/02/2021 2354 Gross per 24 hour  Intake 300 ml  Output 1350 ml  Net -1050 ml   Filed Weights   07/31/21 0532  Weight: 40 kg   Body mass index is 16.66 kg/m.  Exam:  General: Alert and oriented x3, no acute distress HEENT: Normocephalic, atraumatic.  Mucous membranes are dry, noted neck mass Cardiovascular: Regular rate and rhythm, S1-S2 Respiratory: Poor inspiratory effort, decreased breath sounds bibasilar Abdomen: Soft, nontender, nondistended, hypoactive bowel sounds Musculoskeletal: No clubbing or cyanosis or edema Psychiatry: Appropriate, no evidence of psychoses Neurology: No focal deficits   Data Reviewed: CBC: Recent Labs  Lab 07/31/21 0542 07/31/21 1435 07/31/21 2327 08/01/21 0519 08/02/21 0642 08/03/21 0512  WBC 4.1 4.5 4.9 4.4 5.2 6.9  NEUTROABS 2.8  --   --   --   --   --   HGB 11.0* 10.2* 8.8* 9.1* 8.9* 9.6*  HCT 33.2* 31.6* 27.8* 28.9* 28.7* 31.0*  MCV 104.1* 105.0* 105.7* 106.6* 107.5* 106.9*  PLT 338 330 309 285 262 093   Basic Metabolic Panel: Recent Labs  Lab 07/28/21 0144 07/29/21 0629 07/30/21 0451 07/31/21 0542 08/01/21 0514 08/02/21 0642 08/03/21 0512  NA  --    < > 136 134* 136 137 136  K  --    < > 3.9 3.8 3.2* 3.4* 3.4*  CL  --    < > 110 105 105 109 107  CO2  --    < > 23 23 22  20* 24  GLUCOSE  --    < > 139* 86 100* 105* 131*  BUN  --    < > 7 <5* <5* <5* <5*  CREATININE  --    < > 0.66 0.81 0.95 0.87 0.72  CALCIUM  --    < > 8.2* 8.9 7.9* 8.0* 8.2*  MG 2.6*  --   --  2.1  --   --   --    < > = values in this interval not displayed.    GFR: Estimated Creatinine Clearance: 49.6 mL/min (by C-G formula based on SCr of 0.72 mg/dL). Liver Function Tests: Recent Labs  Lab 07/30/21 0451 07/31/21 0542  AST 71* 77*  ALT 26 26  ALKPHOS 42 50  BILITOT 0.9 1.3*  PROT 7.6 8.5*  ALBUMIN 3.5 3.9   No results for input(s): LIPASE, AMYLASE in the last 168 hours. Recent Labs  Lab 07/31/21 0617  AMMONIA 17   Coagulation Profile: Recent Labs  Lab 07/29/21 0613 07/31/21 0542  INR 1.1 1.1   Cardiac Enzymes: No results for input(s): CKTOTAL, CKMB, CKMBINDEX, TROPONINI in the last 168 hours. BNP (last 3 results) No results for input(s): PROBNP in the last 8760 hours. HbA1C: No results for input(s): HGBA1C in the last 72 hours. CBG: Recent Labs  Lab 08/02/21 0612 08/02/21 0830 08/02/21 1235 08/02/21 1713 08/03/21 0752  GLUCAP 92 82 79 95 121*   Lipid Profile: No results for input(s): CHOL, HDL, LDLCALC, TRIG, CHOLHDL, LDLDIRECT in the last 72 hours.  Thyroid Function Tests: No results for input(s): TSH, T4TOTAL, FREET4, T3FREE, THYROIDAB in the last 72 hours. Anemia Panel: No results for input(s): VITAMINB12, FOLATE, FERRITIN, TIBC, IRON, RETICCTPCT in the last 72 hours. Urine analysis:    Component Value Date/Time   COLORURINE STRAW (A) 12/07/2015 0020   APPEARANCEUR CLEAR (A) 12/07/2015 0020   APPEARANCEUR Clear 12/27/2013 0847   LABSPEC 1.005 12/07/2015 0020   LABSPEC 1.006 12/27/2013 0847   PHURINE 7.0 12/07/2015 0020   GLUCOSEU NEGATIVE 12/07/2015 0020   GLUCOSEU Negative 12/27/2013 0847   HGBUR NEGATIVE 12/07/2015 0020   BILIRUBINUR NEGATIVE 12/07/2015 0020   BILIRUBINUR Negative 12/27/2013 0847   KETONESUR NEGATIVE 12/07/2015 0020   PROTEINUR NEGATIVE 12/07/2015 0020   NITRITE NEGATIVE 12/07/2015 0020   LEUKOCYTESUR NEGATIVE 12/07/2015 0020   LEUKOCYTESUR Negative 12/27/2013 0847   Sepsis Labs: @LABRCNTIP (procalcitonin:4,lacticidven:4)  ) Recent Results (from the past 240 hour(s))  Resp  Panel by RT-PCR (Flu A&B, Covid) Nasopharyngeal Swab     Status: None   Collection Time: 07/28/21 10:07 AM   Specimen: Nasopharyngeal Swab; Nasopharyngeal(NP) swabs in vial transport medium  Result Value Ref Range Status   SARS Coronavirus 2 by RT PCR NEGATIVE NEGATIVE Final    Comment: (NOTE) SARS-CoV-2 target nucleic acids are NOT DETECTED.  The SARS-CoV-2 RNA is generally detectable in upper respiratory specimens during the acute phase of infection. The lowest concentration of SARS-CoV-2 viral copies this assay can detect is 138 copies/mL. A negative result does not preclude SARS-Cov-2 infection and should not be used as the sole basis for treatment or other patient management decisions. A negative result may occur with  improper specimen collection/handling, submission of specimen other than nasopharyngeal swab, presence of viral mutation(s) within the areas targeted by this assay, and inadequate number of viral copies(<138 copies/mL). A negative result must be combined with clinical observations, patient history, and epidemiological information. The expected result is Negative.  Fact Sheet for Patients:  EntrepreneurPulse.com.au  Fact Sheet for Healthcare Providers:  IncredibleEmployment.be  This test is no t yet approved or cleared by the [place] FDA and  has been authorized for detection and/or diagnosis of SARS-CoV-2 by FDA under an Emergency Use Authorization (EUA). This EUA will remain  in effect (meaning this test can be used) for the duration of the COVID-19 declaration under Section 564(b)(1) of the Act, 21 U.S.C.section 360bbb-3(b)(1), unless the authorization is terminated  or revoked sooner.       Influenza A by PCR NEGATIVE NEGATIVE Final   Influenza B by PCR NEGATIVE NEGATIVE Final    Comment: (NOTE) The Xpert Xpress SARS-CoV-2/FLU/RSV plus assay is intended as an aid in the diagnosis of influenza from Nasopharyngeal  swab specimens and should not be used as a sole basis for treatment. Nasal washings and aspirates are unacceptable for Xpert Xpress SARS-CoV-2/FLU/RSV testing.  Fact Sheet for Patients: EntrepreneurPulse.com.au  Fact Sheet for Healthcare Providers: IncredibleEmployment.be  This test is not yet approved or cleared by the [place] FDA and has been authorized for detection and/or diagnosis of SARS-CoV-2 by FDA under an Emergency Use Authorization (EUA). This EUA will remain in effect (meaning this test can be used) for the duration of the COVID-19 declaration under Section 564(b)(1) of the Act, 21 U.S.C. section 360bbb-3(b)(1), unless the authorization is terminated or revoked.  Performed at [facility], [address] [place], [place] [address]   Expectorated Sputum Assessment w Gram Stain, Rflx to Resp Cult     Status: None   Collection Time:  07/31/21  4:15 AM   Specimen: Expectorated Sputum  Result Value Ref Range Status   Specimen Description EXPECTORATED SPUTUM  Final   Special Requests NONE  Final   Sputum evaluation   Final    Sputum specimen not acceptable for testing.  Please recollect.   Lonn Georgia FREEMIRE  08/01/21 @ 0545 BY SB Performed at [facility], [address] [place], [place] [address]    Report Status 08/01/2021 FINAL  Final  Resp Panel by RT-PCR (Flu A&B, Covid) Nasopharyngeal Swab     Status: None   Collection Time: 07/31/21  5:42 AM   Specimen: Nasopharyngeal Swab; Nasopharyngeal(NP) swabs in vial transport medium  Result Value Ref Range Status   SARS Coronavirus 2 by RT PCR NEGATIVE NEGATIVE Final    Comment: (NOTE) SARS-CoV-2 target nucleic acids are NOT DETECTED.  The SARS-CoV-2 RNA is generally detectable in upper respiratory specimens during the acute phase of infection. The lowest concentration of SARS-CoV-2 viral copies this assay can detect is 138 copies/mL. A negative result does not  preclude SARS-Cov-2 infection and should not be used as the sole basis for treatment or other patient management decisions. A negative result may occur with  improper specimen collection/handling, submission of specimen other than nasopharyngeal swab, presence of viral mutation(s) within the areas targeted by this assay, and inadequate number of viral copies(<138 copies/mL). A negative result must be combined with clinical observations, patient history, and epidemiological information. The expected result is Negative.  Fact Sheet for Patients:  EntrepreneurPulse.com.au  Fact Sheet for Healthcare Providers:  IncredibleEmployment.be  This test is no t yet approved or cleared by the [place] FDA and  has been authorized for detection and/or diagnosis of SARS-CoV-2 by FDA under an Emergency Use Authorization (EUA). This EUA will remain  in effect (meaning this test can be used) for the duration of the COVID-19 declaration under Section 564(b)(1) of the Act, 21 U.S.C.section 360bbb-3(b)(1), unless the authorization is terminated  or revoked sooner.       Influenza A by PCR NEGATIVE NEGATIVE Final   Influenza B by PCR NEGATIVE NEGATIVE Final    Comment: (NOTE) The Xpert Xpress SARS-CoV-2/FLU/RSV plus assay is intended as an aid in the diagnosis of influenza from Nasopharyngeal swab specimens and should not be used as a sole basis for treatment. Nasal washings and aspirates are unacceptable for Xpert Xpress SARS-CoV-2/FLU/RSV testing.  Fact Sheet for Patients: EntrepreneurPulse.com.au  Fact Sheet for Healthcare Providers: IncredibleEmployment.be  This test is not yet approved or cleared by the [place] FDA and has been authorized for detection and/or diagnosis of SARS-CoV-2 by FDA under an Emergency Use Authorization (EUA). This EUA will remain in effect (meaning this test can be used) for the  duration of the COVID-19 declaration under Section 564(b)(1) of the Act, 21 U.S.C. section 360bbb-3(b)(1), unless the authorization is terminated or revoked.  Performed at [facility], [address] [place], [place] [address]       Studies: MR BRAIN WO CONTRAST  Result Date: 08/02/2021 CLINICAL DATA:  Gastrointestinal cancer, staging EXAM: MRI HEAD WITHOUT CONTRAST TECHNIQUE: Multiplanar, multiecho pulse sequences of the brain and surrounding structures were obtained without intravenous contrast. COMPARISON:  CT head 08/01/2021. FINDINGS: Evaluation is limited as the exam is incomplete and no intravenous contrast was given. Brain: No restricted diffusion to suggest acute infarct; metastatic disease could also restrict diffusion. No acute hemorrhage, significant edema mass, mass effect, or midline shift. Minimal T2 hyperintense signal in the periventricular white  matter, likely the sequela of chronic small vessel ischemic disease. Vascular: Normal flow voids. Skull and upper cervical spine: No diffusion restricting lesions in the imaged osseous structures. Sinuses/Orbits: Negative. Other: Fluid in right mastoid air cells. IMPRESSION: 1. Limited evaluation secondary to early termination exam. Within this limitation, no acute intracranial process. 2. Although evaluation for metastatic disease is significantly limited by the absence of contrast, no edema or diffusion restriction is seen, both of which can be seen in the setting of metastatic disease. Consider repeating the exam with contrast when the patient is better able to tolerate it. Electronically Signed   By: Merilyn Baba M.D.   On: 08/02/2021 12:30    Scheduled Meds:  feeding supplement  237 mL Oral TID BM   fentaNYL  1 patch Transdermal Q72H   levothyroxine  100 mcg Intravenous Daily   LORazepam  0-4 mg Intravenous Q12H   Or   LORazepam  0-4 mg Oral Q12H   nicotine  21 mg Transdermal Daily   pantoprazole (PROTONIX) IV  40  mg Intravenous Q12H   thiamine  100 mg Intravenous Daily    Continuous Infusions:  cefTRIAXone (ROCEPHIN)  IV 2 g (08/03/21 0559)   dextrose 5 % and 0.9% NaCl 100 mL/hr at 08/03/21 0203   metronidazole 500 mg (08/03/21 0946)     LOS: 3 days     Annita Brod, MD [facility]   08/03/2021, 10:02 AM

## 2021-08-04 LAB — CBC
HCT: 27.4 % — ABNORMAL LOW (ref 36.0–46.0)
Hemoglobin: 8.7 g/dL — ABNORMAL LOW (ref 12.0–15.0)
MCH: 33.2 pg (ref 26.0–34.0)
MCHC: 31.8 g/dL (ref 30.0–36.0)
MCV: 104.6 fL — ABNORMAL HIGH (ref 80.0–100.0)
Platelets: 210 10*3/uL (ref 150–400)
RBC: 2.62 MIL/uL — ABNORMAL LOW (ref 3.87–5.11)
RDW: 13 % (ref 11.5–15.5)
WBC: 6.4 10*3/uL (ref 4.0–10.5)
nRBC: 0 % (ref 0.0–0.2)

## 2021-08-04 LAB — BASIC METABOLIC PANEL
Anion gap: 4 — ABNORMAL LOW (ref 5–15)
BUN: <5 mg/dL — ABNORMAL LOW (ref 6–20)
CO2: 25 mmol/L (ref 22–32)
Calcium: 7.7 mg/dL — ABNORMAL LOW (ref 8.9–10.3)
Chloride: 107 mmol/L (ref 98–111)
Creatinine, Ser: 0.62 mg/dL (ref 0.44–1.00)
GFR, Estimated: >60 mL/min (ref >60)
Glucose, Bld: 113 mg/dL — ABNORMAL HIGH (ref 70–99)
Potassium: 2.8 mmol/L — ABNORMAL LOW (ref 3.5–5.1)
Sodium: 136 mmol/L (ref 135–145)

## 2021-08-04 LAB — SURGICAL PCR SCREEN
MRSA, PCR: NEGATIVE
Staphylococcus aureus: NEGATIVE

## 2021-08-04 LAB — GLUCOSE, CAPILLARY
Glucose-Capillary: 100 mg/dL — ABNORMAL HIGH (ref 70–99)
Glucose-Capillary: 109 mg/dL — ABNORMAL HIGH (ref 70–99)

## 2021-08-04 MED ORDER — MUPIROCIN 2 % EX OINT
1.0000 "application " | TOPICAL_OINTMENT | Freq: Two times a day (BID) | CUTANEOUS | Status: DC
  Administered 2021-08-04: 1 via NASAL
  Filled 2021-08-04: qty 22

## 2021-08-04 NOTE — Progress Notes (Signed)
PROGRESS NOTE  [name] NLZ:[id number] DOB: 08/17/1966 DOA: 07/31/2021 PCP: Venita Lick, NP  HPI/Recap of past 70 hours: 55 year old female with past medical history for chronic pancreatitis, polysubstance abuse including tobacco, alcohol and cocaine, bipolar disorder, hepatitis C and COPD who was hospitalized from 11/13-2011/15 for evaluation of large neck mass and biopsy at that time noted squamous cell carcinoma of the esophagus as well as patient being treated for aspiration pneumonia.  Patient left AMA on 11/15 but returned on 11/16 with worsening cough.  Admitted to the hospitalist service.  Oncology consulted.  Initial plan was for general surgery to place G-tube secondary to dysphagia however patient's urine positive for cocaine, so surgery deferred until Monday, 11/21 given time for cocaine to clear patient's system.  Currently resting comfortably.  According nursing, she asked for her pain and anxiety medicines continuously although her vital signs are not elevated.  Patient also was relating to nursing that she feels very anxious as a family member told her her urine looked discolored.  Assessment/Plan: Principal Problem:   Hemoptysis Active Problems:   Chronic pancreatitis ([place]): Stable at this time.    Hypothyroidism: Normally on 150 mcg of Synthroid, but has not taken for several months.  Started on IV Synthroid and patient seems to be slowly improving.  Does not seem to have myxedema.    Hypertension: As needed hydralazine.  Not on any home medications.    Aspiration pneumonia [facility]): Received several more days of antibiotics to complete 5-day course for previous aspiration.  Will need G-tube for preventing further aspirations    Transaminitis: In the context of patient's alcohol and hepatitis C liver disease.  Monitoring.    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  Appreciate oncology help.  Plan is for  concurrent chemotherapy and radiation, but waiting until acute encephalopathy improved and feeding tube placed.  Vascular surgery reviewed films and tumor is encasing great vessels are not small branch that could be prophylactically embolized.  Therefore, no intervention at this point.  Limited exam and patient does not want to repeat MRI of brain, but no evidence of brain metastases    GERD (gastroesophageal reflux disease)   Macrocytic anemia: Secondary to chronic alcohol abuse.  Stable.    Acute metabolic encephalopathy: Looks to have resolved, oriented x3.  Felt to be secondary to noncompliance with thyroid medication and improved now that IV Synthroid has been started.    Protein-calorie malnutrition, severe: Nutrition Status: Nutrition Problem: Severe Malnutrition Etiology: chronic illness (esophageal cancer) Signs/Symptoms: energy intake < or equal to 75% for > or equal to 1 month, severe fat depletion, severe muscle depletion, percent weight loss Percent weight loss: 20.5 % Interventions: Refer to RD note for recommendations  Polysubstance abuse including cocaine/alcohol/tobacco/benzodiazepines: Counseled.  Nicotine patch.  Alcohol level normal this admission, last drink was 3 days prior.  Still placing on CIWA protocol.  Waiting for cocaine to clear her system so that patient can have surgery on Monday.  She continues to complain of pain and anxiousness continuously.  Will not add additional medications to maintain boundaries.  Discolored urine: We will check urinalysis as per patient's concerns    Goals of care, counseling/discussion: Palliative care following.  Appreciate helping to manage symptoms  Anxiety/depression: Appreciate psychiatry help.  Started on Zyprexa and SSRI   Code Status: Full code  Family Communication: Left message for husband  Disposition Plan: To be determined following G-tube placement once tube feeds  have been established and oncology plan  started   Consultants: Palliative care Interventional radiology General surgery Oncology Vascular surgery Psychiatry  Procedures: Plan G-tube placement 11/21  Antimicrobials: IV Rocephin 11/16-11/24 IV Flagyl 11/16-11/22  DVT prophylaxis: SCDs  Level of care: Med-Surg   Objective: Vitals:   08/04/21 0351 08/04/21 0808  BP: 138/75 138/88  Pulse: 77 80  Resp: 14 15  Temp: 98.1 F (36.7 C) 98.8 F (37.1 C)  SpO2: 98% 98%    Intake/Output Summary (Last 24 hours) at 08/04/2021 1013 Last data filed at 08/04/2021 0555 Gross per 24 hour  Intake 4090.25 ml  Output 300 ml  Net 3790.25 ml    Filed Weights   07/31/21 0532  Weight: 40 kg   Body mass index is 16.66 kg/m.  Exam:  General: Currently resting comfortably HEENT: Normocephalic, atraumatic.  Mucous membranes are dry, noted neck mass Cardiovascular: Regular rate and rhythm, S1-S2 Respiratory: decreased breath sounds bibasilar Abdomen: Soft, nontender, nondistended, hypoactive bowel sounds Musculoskeletal: No clubbing or cyanosis or edema Psychiatry: Appropriate, no evidence of psychoses Neurology: No focal deficits   Data Reviewed: CBC: Recent Labs  Lab 07/31/21 0542 07/31/21 1435 07/31/21 2327 08/01/21 0519 08/02/21 0642 08/03/21 0512 08/04/21 0346  WBC 4.1   < > 4.9 4.4 5.2 6.9 6.4  NEUTROABS 2.8  --   --   --   --   --   --   HGB 11.0*   < > 8.8* 9.1* 8.9* 9.6* 8.7*  HCT 33.2*   < > 27.8* 28.9* 28.7* 31.0* 27.4*  MCV 104.1*   < > 105.7* 106.6* 107.5* 106.9* 104.6*  PLT 338   < > 309 285 262 255 210   < > = values in this interval not displayed.    Basic Metabolic Panel: Recent Labs  Lab 07/31/21 0542 08/01/21 0514 08/02/21 0642 08/03/21 0512 08/04/21 0346  NA 134* 136 137 136 136  K 3.8 3.2* 3.4* 3.4* 2.8*  CL 105 105 109 107 107  CO2 23 22 20* 24 25  GLUCOSE 86 100* 105* 131* 113*  BUN <5* <5* <5* <5* <5*  CREATININE 0.81 0.95 0.87 0.72 0.62  CALCIUM 8.9 7.9* 8.0* 8.2*  7.7*  MG 2.1  --   --   --   --     GFR: Estimated Creatinine Clearance: 49.6 mL/min (by C-G formula based on SCr of 0.62 mg/dL). Liver Function Tests: Recent Labs  Lab 07/30/21 0451 07/31/21 0542  AST 71* 77*  ALT 26 26  ALKPHOS 42 50  BILITOT 0.9 1.3*  PROT 7.6 8.5*  ALBUMIN 3.5 3.9    No results for input(s): LIPASE, AMYLASE in the last 168 hours. Recent Labs  Lab 07/31/21 0617  AMMONIA 17    Coagulation Profile: Recent Labs  Lab 07/29/21 0613 07/31/21 0542  INR 1.1 1.1    Cardiac Enzymes: No results for input(s): CKTOTAL, CKMB, CKMBINDEX, TROPONINI in the last 168 hours. BNP (last 3 results) No results for input(s): PROBNP in the last 8760 hours. HbA1C: No results for input(s): HGBA1C in the last 72 hours. CBG: Recent Labs  Lab 08/02/21 1713 08/03/21 0752 08/03/21 1724 08/03/21 2353 08/04/21 0354  GLUCAP 95 121* 108* 91 109*    Lipid Profile: No results for input(s): CHOL, HDL, LDLCALC, TRIG, CHOLHDL, LDLDIRECT in the last 72 hours. Thyroid Function Tests: No results for input(s): TSH, T4TOTAL, FREET4, T3FREE, THYROIDAB in the last 72 hours. Anemia Panel: No results for input(s): VITAMINB12, FOLATE, FERRITIN, TIBC, IRON,  RETICCTPCT in the last 72 hours. Urine analysis:    Component Value Date/Time   COLORURINE STRAW (A) 12/07/2015 0020   APPEARANCEUR CLEAR (A) 12/07/2015 0020   APPEARANCEUR Clear 12/27/2013 0847   LABSPEC 1.005 12/07/2015 0020   LABSPEC 1.006 12/27/2013 0847   PHURINE 7.0 12/07/2015 0020   GLUCOSEU NEGATIVE 12/07/2015 0020   GLUCOSEU Negative 12/27/2013 0847   HGBUR NEGATIVE 12/07/2015 0020   BILIRUBINUR NEGATIVE 12/07/2015 0020   BILIRUBINUR Negative 12/27/2013 0847   KETONESUR NEGATIVE 12/07/2015 0020   PROTEINUR NEGATIVE 12/07/2015 0020   NITRITE NEGATIVE 12/07/2015 0020   LEUKOCYTESUR NEGATIVE 12/07/2015 0020   LEUKOCYTESUR Negative 12/27/2013 0847   Sepsis  Labs: @LABRCNTIP (procalcitonin:4,lacticidven:4)  ) Recent Results (from the past 240 hour(s))  Resp Panel by RT-PCR (Flu A&B, Covid) Nasopharyngeal Swab     Status: None   Collection Time: 07/28/21 10:07 AM   Specimen: Nasopharyngeal Swab; Nasopharyngeal(NP) swabs in vial transport medium  Result Value Ref Range Status   SARS Coronavirus 2 by RT PCR NEGATIVE NEGATIVE Final    Comment: (NOTE) SARS-CoV-2 target nucleic acids are NOT DETECTED.  The SARS-CoV-2 RNA is generally detectable in upper respiratory specimens during the acute phase of infection. The lowest concentration of SARS-CoV-2 viral copies this assay can detect is 138 copies/mL. A negative result does not preclude SARS-Cov-2 infection and should not be used as the sole basis for treatment or other patient management decisions. A negative result may occur with  improper specimen collection/handling, submission of specimen other than nasopharyngeal swab, presence of viral mutation(s) within the areas targeted by this assay, and inadequate number of viral copies(<138 copies/mL). A negative result must be combined with clinical observations, patient history, and epidemiological information. The expected result is Negative.  Fact Sheet for Patients:  EntrepreneurPulse.com.au  Fact Sheet for Healthcare Providers:  IncredibleEmployment.be  This test is no t yet approved or cleared by the [place] FDA and  has been authorized for detection and/or diagnosis of SARS-CoV-2 by FDA under an Emergency Use Authorization (EUA). This EUA will remain  in effect (meaning this test can be used) for the duration of the COVID-19 declaration under Section 564(b)(1) of the Act, 21 U.S.C.section 360bbb-3(b)(1), unless the authorization is terminated  or revoked sooner.       Influenza A by PCR NEGATIVE NEGATIVE Final   Influenza B by PCR NEGATIVE NEGATIVE Final    Comment: (NOTE) The Xpert  Xpress SARS-CoV-2/FLU/RSV plus assay is intended as an aid in the diagnosis of influenza from Nasopharyngeal swab specimens and should not be used as a sole basis for treatment. Nasal washings and aspirates are unacceptable for Xpert Xpress SARS-CoV-2/FLU/RSV testing.  Fact Sheet for Patients: EntrepreneurPulse.com.au  Fact Sheet for Healthcare Providers: IncredibleEmployment.be  This test is not yet approved or cleared by the [place] FDA and has been authorized for detection and/or diagnosis of SARS-CoV-2 by FDA under an Emergency Use Authorization (EUA). This EUA will remain in effect (meaning this test can be used) for the duration of the COVID-19 declaration under Section 564(b)(1) of the Act, 21 U.S.C. section 360bbb-3(b)(1), unless the authorization is terminated or revoked.  Performed at [facility], [place], [place] [address]   Expectorated Sputum Assessment w Gram Stain, Rflx to Resp Cult     Status: None   Collection Time: 07/31/21  4:15 AM   Specimen: Expectorated Sputum  Result Value Ref Range Status   Specimen Description EXPECTORATED SPUTUM  Final   Special Requests NONE  Final   Sputum evaluation   Final    Sputum specimen not acceptable for testing.  Please recollect.   Lonn Georgia FREEMIRE  08/01/21 @ 0545 BY SB Performed at [facility], [address] [place], [place] [address]    Report Status 08/01/2021 FINAL  Final  Resp Panel by RT-PCR (Flu A&B, Covid) Nasopharyngeal Swab     Status: None   Collection Time: 07/31/21  5:42 AM   Specimen: Nasopharyngeal Swab; Nasopharyngeal(NP) swabs in vial transport medium  Result Value Ref Range Status   SARS Coronavirus 2 by RT PCR NEGATIVE NEGATIVE Final    Comment: (NOTE) SARS-CoV-2 target nucleic acids are NOT DETECTED.  The SARS-CoV-2 RNA is generally detectable in upper respiratory specimens during the acute phase of infection. The  lowest concentration of SARS-CoV-2 viral copies this assay can detect is 138 copies/mL. A negative result does not preclude SARS-Cov-2 infection and should not be used as the sole basis for treatment or other patient management decisions. A negative result may occur with  improper specimen collection/handling, submission of specimen other than nasopharyngeal swab, presence of viral mutation(s) within the areas targeted by this assay, and inadequate number of viral copies(<138 copies/mL). A negative result must be combined with clinical observations, patient history, and epidemiological information. The expected result is Negative.  Fact Sheet for Patients:  EntrepreneurPulse.com.au  Fact Sheet for Healthcare Providers:  IncredibleEmployment.be  This test is no t yet approved or cleared by the [place] FDA and  has been authorized for detection and/or diagnosis of SARS-CoV-2 by FDA under an Emergency Use Authorization (EUA). This EUA will remain  in effect (meaning this test can be used) for the duration of the COVID-19 declaration under Section 564(b)(1) of the Act, 21 U.S.C.section 360bbb-3(b)(1), unless the authorization is terminated  or revoked sooner.       Influenza A by PCR NEGATIVE NEGATIVE Final   Influenza B by PCR NEGATIVE NEGATIVE Final    Comment: (NOTE) The Xpert Xpress SARS-CoV-2/FLU/RSV plus assay is intended as an aid in the diagnosis of influenza from Nasopharyngeal swab specimens and should not be used as a sole basis for treatment. Nasal washings and aspirates are unacceptable for Xpert Xpress SARS-CoV-2/FLU/RSV testing.  Fact Sheet for Patients: EntrepreneurPulse.com.au  Fact Sheet for Healthcare Providers: IncredibleEmployment.be  This test is not yet approved or cleared by the [place] FDA and has been authorized for detection and/or diagnosis of SARS-CoV-2 by FDA under  an Emergency Use Authorization (EUA). This EUA will remain in effect (meaning this test can be used) for the duration of the COVID-19 declaration under Section 564(b)(1) of the Act, 21 U.S.C. section 360bbb-3(b)(1), unless the authorization is terminated or revoked.  Performed at [facility], [address] [place], [place] [address]       Studies: No results found.  Scheduled Meds:  feeding supplement  237 mL Oral TID BM   fentaNYL  1 patch Transdermal Q72H   levothyroxine  100 mcg Intravenous Daily   LORazepam  0-4 mg Intravenous Q12H   Or   LORazepam  0-4 mg Oral Q12H   nicotine  21 mg Transdermal Daily   pantoprazole (PROTONIX) IV  40 mg Intravenous Q12H   thiamine  100 mg Intravenous Daily    Continuous Infusions:  cefTRIAXone (ROCEPHIN)  IV 2 g (08/04/21 0548)   dextrose 5 % and 0.9% NaCl Stopped (08/04/21 0548)   metronidazole 500 mg (08/04/21 0906)     LOS: 4 days  Annita Brod, MD [facility]   08/04/2021, 10:13 AM

## 2021-08-05 ENCOUNTER — Encounter
Admission: EM | Discharge status: Against Medical Advice | Payer: Self-pay | Source: Home / Self Care | Attending: Internal Medicine

## 2021-08-05 ENCOUNTER — Inpatient Hospital Stay: Payer: Medicaid Other | Admitting: Anesthesiology

## 2021-08-05 ENCOUNTER — Encounter: Payer: Self-pay | Admitting: Internal Medicine

## 2021-08-05 DIAGNOSIS — E43 Unspecified severe protein-calorie malnutrition: Secondary | ICD-10-CM

## 2021-08-05 DIAGNOSIS — K222 Esophageal obstruction: Secondary | ICD-10-CM

## 2021-08-05 LAB — URINE DRUG SCREEN, QUALITATIVE (ARMC ONLY)
Amphetamines, Ur Screen: NOT DETECTED
Barbiturates, Ur Screen: NOT DETECTED
Benzodiazepine, Ur Scrn: POSITIVE — AB
Cannabinoid 50 Ng, Ur ~~LOC~~: POSITIVE — AB
Cocaine Metabolite,Ur ~~LOC~~: NOT DETECTED
MDMA (Ecstasy)Ur Screen: NOT DETECTED
Methadone Scn, Ur: NOT DETECTED
Opiate, Ur Screen: POSITIVE — AB
Phencyclidine (PCP) Ur S: NOT DETECTED
Tricyclic, Ur Screen: NOT DETECTED

## 2021-08-05 LAB — GLUCOSE, CAPILLARY
Glucose-Capillary: 117 mg/dL — ABNORMAL HIGH (ref 70–99)
Glucose-Capillary: 124 mg/dL — ABNORMAL HIGH (ref 70–99)

## 2021-08-05 LAB — URINALYSIS, ROUTINE W REFLEX MICROSCOPIC
Bilirubin Urine: NEGATIVE
Glucose, UA: NEGATIVE mg/dL
Hgb urine dipstick: NEGATIVE
Ketones, ur: NEGATIVE mg/dL
Leukocytes,Ua: NEGATIVE
Nitrite: NEGATIVE
Protein, ur: NEGATIVE mg/dL
Specific Gravity, Urine: 1.009 (ref 1.005–1.030)
pH: 6 (ref 5.0–8.0)

## 2021-08-05 SURGERY — INSERTION, GASTROSTOMY TUBE, ROBOT-ASSISTED
Anesthesia: General | Site: Abdomen

## 2021-08-05 MED ORDER — DEXAMETHASONE SODIUM PHOSPHATE 10 MG/ML IJ SOLN
INTRAMUSCULAR | Status: AC
  Filled 2021-08-05: qty 1

## 2021-08-05 MED ORDER — SUGAMMADEX SODIUM 200 MG/2ML IV SOLN
INTRAVENOUS | Status: DC | PRN
  Administered 2021-08-05: 160 mg via INTRAVENOUS

## 2021-08-05 MED ORDER — BUPIVACAINE-EPINEPHRINE 0.25% -1:200000 IJ SOLN
INTRAMUSCULAR | Status: DC | PRN
  Administered 2021-08-05: 18 mL

## 2021-08-05 MED ORDER — GLYCOPYRROLATE 0.2 MG/ML IJ SOLN
INTRAMUSCULAR | Status: AC
  Filled 2021-08-05: qty 1

## 2021-08-05 MED ORDER — ROCURONIUM BROMIDE 100 MG/10ML IV SOLN
INTRAVENOUS | Status: DC | PRN
  Administered 2021-08-05: 10 mg via INTRAVENOUS
  Administered 2021-08-05: 20 mg via INTRAVENOUS
  Administered 2021-08-05: 30 mg via INTRAVENOUS

## 2021-08-05 MED ORDER — GLYCOPYRROLATE 0.2 MG/ML IJ SOLN
INTRAMUSCULAR | Status: DC | PRN
  Administered 2021-08-05: .2 mg via INTRAVENOUS

## 2021-08-05 MED ORDER — DEXAMETHASONE SODIUM PHOSPHATE 10 MG/ML IJ SOLN
INTRAMUSCULAR | Status: DC | PRN
  Administered 2021-08-05: 10 mg via INTRAVENOUS

## 2021-08-05 MED ORDER — SODIUM CHLORIDE 0.9 % IR SOLN
Status: DC | PRN
  Administered 2021-08-05: 500 mL

## 2021-08-05 MED ORDER — MIDAZOLAM HCL 2 MG/2ML IJ SOLN
INTRAMUSCULAR | Status: AC
  Filled 2021-08-05: qty 2

## 2021-08-05 MED ORDER — PHENYLEPHRINE HCL (PRESSORS) 10 MG/ML IV SOLN
INTRAVENOUS | Status: DC | PRN
  Administered 2021-08-05: 160 ug via INTRAVENOUS
  Administered 2021-08-05 (×3): 80 ug via INTRAVENOUS

## 2021-08-05 MED ORDER — PROPOFOL 10 MG/ML IV BOLUS
INTRAVENOUS | Status: DC | PRN
  Administered 2021-08-05: 100 mg via INTRAVENOUS

## 2021-08-05 MED ORDER — ONDANSETRON HCL 4 MG/2ML IJ SOLN
INTRAMUSCULAR | Status: DC | PRN
  Administered 2021-08-05: 4 mg via INTRAVENOUS

## 2021-08-05 MED ORDER — MIDAZOLAM HCL 2 MG/2ML IJ SOLN
INTRAMUSCULAR | Status: DC | PRN
  Administered 2021-08-05: 2 mg via INTRAVENOUS

## 2021-08-05 MED ORDER — FENTANYL CITRATE (PF) 100 MCG/2ML IJ SOLN
25.0000 ug | INTRAMUSCULAR | Status: DC | PRN
  Administered 2021-08-05 (×2): 25 ug via INTRAVENOUS

## 2021-08-05 MED ORDER — LACTATED RINGERS IV SOLN: INTRAVENOUS | Status: DC

## 2021-08-05 MED ORDER — PROPOFOL 10 MG/ML IV BOLUS
INTRAVENOUS | Status: AC
  Filled 2021-08-05: qty 20

## 2021-08-05 MED ORDER — BUPIVACAINE LIPOSOME 1.3 % IJ SUSP
INTRAMUSCULAR | Status: AC
  Filled 2021-08-05: qty 20

## 2021-08-05 MED ORDER — ONDANSETRON HCL 4 MG/2ML IJ SOLN
INTRAMUSCULAR | Status: AC
  Filled 2021-08-05: qty 2

## 2021-08-05 MED ORDER — SUCCINYLCHOLINE CHLORIDE 200 MG/10ML IV SOSY
PREFILLED_SYRINGE | INTRAVENOUS | Status: AC
  Filled 2021-08-05: qty 10

## 2021-08-05 MED ORDER — LIDOCAINE HCL (PF) 2 % IJ SOLN
INTRAMUSCULAR | Status: AC
  Filled 2021-08-05: qty 5

## 2021-08-05 MED ORDER — OXYCODONE HCL 5 MG/5ML PO SOLN: 5.0000 mg | Freq: Once | ORAL | Status: DC | PRN

## 2021-08-05 MED ORDER — FENTANYL CITRATE (PF) 100 MCG/2ML IJ SOLN
INTRAMUSCULAR | Status: AC
  Filled 2021-08-05: qty 2

## 2021-08-05 MED ORDER — OXYCODONE HCL 5 MG PO TABS: 5.0000 mg | ORAL_TABLET | Freq: Once | ORAL | Status: DC | PRN

## 2021-08-05 MED ORDER — ROCURONIUM BROMIDE 10 MG/ML (PF) SYRINGE
PREFILLED_SYRINGE | INTRAVENOUS | Status: AC
  Filled 2021-08-05: qty 10

## 2021-08-05 MED ORDER — FENTANYL CITRATE (PF) 100 MCG/2ML IJ SOLN
INTRAMUSCULAR | Status: AC
  Administered 2021-08-05: 50 ug via INTRAVENOUS
  Filled 2021-08-05: qty 2

## 2021-08-05 MED ORDER — LIDOCAINE HCL (CARDIAC) PF 100 MG/5ML IV SOSY
PREFILLED_SYRINGE | INTRAVENOUS | Status: DC | PRN
  Administered 2021-08-05: 40 mg via INTRAVENOUS

## 2021-08-05 MED ORDER — FENTANYL CITRATE (PF) 100 MCG/2ML IJ SOLN
INTRAMUSCULAR | Status: DC | PRN
  Administered 2021-08-05: 50 ug via INTRAVENOUS

## 2021-08-05 MED ORDER — BUPIVACAINE-EPINEPHRINE (PF) 0.25% -1:200000 IJ SOLN
INTRAMUSCULAR | Status: AC
  Filled 2021-08-05: qty 30

## 2021-08-05 MED ORDER — SUCCINYLCHOLINE CHLORIDE 200 MG/10ML IV SOSY
PREFILLED_SYRINGE | INTRAVENOUS | Status: DC | PRN
  Administered 2021-08-05: 40 mg via INTRAVENOUS

## 2021-08-05 MED ORDER — EPHEDRINE SULFATE 50 MG/ML IJ SOLN
INTRAMUSCULAR | Status: DC | PRN
  Administered 2021-08-05: 5 mg via INTRAVENOUS

## 2021-08-05 SURGICAL SUPPLY — 48 items
ADH SKN CLS APL DERMABOND .7 (GAUZE/BANDAGES/DRESSINGS) ×1
APL PRP STRL LF DISP 70% ISPRP (MISCELLANEOUS) ×1
BAG SPEC RTRVL LRG 6X4 10 (ENDOMECHANICALS)
CANNULA REDUC XI 12-8 STAPL (CANNULA)
CANNULA REDUCER 12-8 DVNC XI (CANNULA) IMPLANT
CHLORAPREP W/TINT 26 (MISCELLANEOUS) ×2 IMPLANT
COVER TIP SHEARS 8 DVNC (MISCELLANEOUS) ×1 IMPLANT
COVER TIP SHEARS 8MM DA VINCI (MISCELLANEOUS) ×2
DECANTER SPIKE VIAL GLASS SM (MISCELLANEOUS) ×1 IMPLANT
DEFOGGER SCOPE WARMER CLEARIFY (MISCELLANEOUS) ×2 IMPLANT
DERMABOND ADVANCED (GAUZE/BANDAGES/DRESSINGS) ×1
DERMABOND ADVANCED .7 DNX12 (GAUZE/BANDAGES/DRESSINGS) ×1 IMPLANT
DRAPE ARM DVNC X/XI (DISPOSABLE) ×4 IMPLANT
DRAPE COLUMN DVNC XI (DISPOSABLE) ×1 IMPLANT
DRAPE DA VINCI XI ARM (DISPOSABLE) ×8
DRAPE DA VINCI XI COLUMN (DISPOSABLE) ×2
ELECT REM PT RETURN 9FT ADLT (ELECTROSURGICAL) ×2
ELECTRODE REM PT RTRN 9FT ADLT (ELECTROSURGICAL) ×1 IMPLANT
GAUZE 4X4 16PLY ~~LOC~~+RFID DBL (SPONGE) ×2 IMPLANT
GLOVE SURG ORTHO LTX SZ7.5 (GLOVE) ×6 IMPLANT
GOWN STRL REUS W/ TWL LRG LVL3 (GOWN DISPOSABLE) ×4 IMPLANT
GOWN STRL REUS W/TWL LRG LVL3 (GOWN DISPOSABLE) ×8
GRASPER SUT TROCAR 14GX15 (MISCELLANEOUS) IMPLANT
IRRIGATION STRYKERFLOW (MISCELLANEOUS) IMPLANT
IRRIGATOR STRYKERFLOW (MISCELLANEOUS)
IV NS 1000ML (IV SOLUTION)
IV NS 1000ML BAXH (IV SOLUTION) IMPLANT
KIT PINK PAD W/HEAD ARE REST (MISCELLANEOUS) ×2
KIT PINK PAD W/HEAD ARM REST (MISCELLANEOUS) ×1 IMPLANT
KIT TURNOVER KIT A (KITS) ×2 IMPLANT
MANIFOLD NEPTUNE II (INSTRUMENTS) ×2 IMPLANT
NEEDLE HYPO 22GX1.5 SAFETY (NEEDLE) ×2 IMPLANT
NS IRRIG 500ML POUR BTL (IV SOLUTION) ×2 IMPLANT
PACK LAP CHOLECYSTECTOMY (MISCELLANEOUS) ×2 IMPLANT
POUCH SPECIMEN RETRIEVAL 10MM (ENDOMECHANICALS) ×1 IMPLANT
SEAL CANN UNIV 5-8 DVNC XI (MISCELLANEOUS) ×3 IMPLANT
SEAL XI 5MM-8MM UNIVERSAL (MISCELLANEOUS) ×6
STAPLER CANNULA SEAL DVNC XI (STAPLE) IMPLANT
STAPLER CANNULA SEAL XI (STAPLE) ×2
SUT MNCRL 4-0 (SUTURE) ×4
SUT MNCRL 4-0 27XMFL (SUTURE) ×2
SUT VICRYL 0 AB UR-6 (SUTURE) ×3 IMPLANT
SUTURE MNCRL 4-0 27XMF (SUTURE) ×1 IMPLANT
TROCAR XCEL 12X100 BLDLESS (ENDOMECHANICALS) ×2 IMPLANT
TROCAR Z-THREAD FIOS 11X100 BL (TROCAR) ×2 IMPLANT
TUBE JEJUNO ENFIT CONN 18F 20C (TUBING) ×1 IMPLANT
TUBING INSUFFLATION (TUBING) ×2 IMPLANT
WATER STERILE IRR 500ML POUR (IV SOLUTION) ×1 IMPLANT

## 2021-08-05 NOTE — Anesthesia Procedure Notes (Signed)
Procedure Name: Intubation Date/Time: 08/05/2021 8:20 AM Performed by: Jerrye Noble, CRNA Pre-anesthesia Checklist: Patient identified, Emergency Drugs available, Suction available and Patient being monitored Patient Re-evaluated:Patient Re-evaluated prior to induction Oxygen Delivery Method: Circle system utilized Preoxygenation: Pre-oxygenation with 100% oxygen Induction Type: IV induction Ventilation: Mask ventilation without difficulty Laryngoscope Size: McGraph and 3 Grade View: Grade I Tube type: Oral Tube size: 6.5 mm Number of attempts: 1 Airway Equipment and Method: Stylet and Oral airway Placement Confirmation: ETT inserted through vocal cords under direct vision, positive ETCO2 and breath sounds checked- equal and bilateral Secured at: 22 cm Tube secured with: Tape Dental Injury: Teeth and Oropharynx as per pre-operative assessment

## 2021-08-05 NOTE — Op Note (Signed)
Robotic gastrostomy.  Pre-operative Diagnosis: Esophageal obstruction, severe malnutrition.   Post-operative Diagnosis:  Same.  Procedure: Robotic assisted laparoscopic gastrostomy tube placement.  Surgeon: Ronny Bacon, M.D., FACS  Anesthesia: General. with endotracheal tube  Findings: Scarring from previous tube(s) showing complete and sufficient separation of gastric wall from anterior abdominal wall.  Estimated Blood Loss: 5 mL         Drains: 92 French gastrostomy replacement tube utilized with 20 mL balloon, filled with saline.         Specimens: None       Complications: none  Procedure Details  The patient was seen again in the Holding Room.  The benefits, complications, treatment options, risks and expected outcomes were again reviewed with the patient. The likelihood of improving the patient's condition with return to their preoperative status is good.  The patient and/or family concurred with the proposed plan, giving informed consent, again alternatives reviewed.  The patient was taken to Operating Room, identified, and the procedure verified as robotic assisted laparoscopic gastrostomy.  Prior to the induction of general anesthesia, antibiotic prophylaxis was administered. VTE prophylaxis was in place. General endotracheal anesthesia was then administered and tolerated well. The patient was positioned in the supine position.  After the induction, the abdomen was prepped with Chloraprep and draped in the sterile fashion.  A Time Out was held and the above information confirmed. After local infiltration of quarter percent Marcaine with epinephrine, stab incision was made left upper quadrant.  Just below the costal margin at Palmer's point, approximately midclavicular line the Veres needle is passed with sensation of the layers to penetrate the abdominal wall and into the peritoneum.  Saline drop test is confirmed peritoneal placement.  Insufflation is initiated with carbon  dioxide to pressures of 15 mmHg. Pneumoperitoneum was then continued utilizing CO2 at 8 mmHg or less and initially she had a degree of bradycardia with brief episode of hypotension but this was quickly corrected then it was tolerated well without any adverse changes in the patient's vital signs.  Two 8.5-mm ports were placed in the right lower quadrant and laterally, and one to the left lower quadrant, all under direct vision. All skin incisions  were infiltrated with a local anesthetic agent before making the incision and placing the trocars.  The patient was positioned  in reverse Trendelenburg.  Da Vinci XI robot was then positioned on to the patient's left side, and docked.  The the prior gastrostomy scarring was identified, the scarring was taken down, confirming complete scarring/healing of the prior gastrotomy. We then identified, confirming complete mobility of the stomach utilizing tips up and fenestrated bipolar graspers.  We then selected a location in the left upper quadrant and marked it transabdominally. I then returned to the bedside in order to make the left upper quadrant incision, and passed the 18 French replacement gastrostomy tube into the peritoneal cavity. I then proceeded with creating a pursestring suture of the anterior gastric wall utilizing 2-0 Monocryl V-Loc suture.  I then created a gastrotomy with the monopolar scissors and dilated the opening to accommodate the gastrostomy tube. Tube was successfully placed within the lumen of the stomach and the pursestring suture secured.  A second 270 degree wrap was completed around this opening, and then the suture was taken to the anterior abdominal wall securing it there as well.  The balloon was then inflated with 20 mL of saline and confirmed intact and I withdrew the catheter to draw the pursestrings completely up against  the anterior abdominal wall.  Additional 2-0 Monocryl sutures were utilized there to provide additional securing  of the anterior stomach wall to the anterior abdominal wall. The sutures were retrieved, and the robot was undocked.  Adequate hemostasis was assured.   No irrigation was utilized.   Inspection of the left upper quadrant was performed. No bleeding, leak or bowel injury was noted.  Pneumoperitoneum was released and ports removed.  4-0 subcuticular Monocryl was used to close the skin. Dermabond was  applied.  The patient was then extubated and brought to the recovery room in stable condition. Sponge, lap, and needle counts were correct at closure and at the conclusion of the case.               Ronny Bacon, M.D., [facility] 08/05/2021 10:33 AM

## 2021-08-05 NOTE — Plan of Care (Signed)
Alert and oriented to self and place, disoriented to time and situation, easy to reorient. Pain and anxiety managed with prn medications. IVF maintained. Up to [facility] with 1 assist. CHG bath completed, SCDs on. Pt requiring frequent reminders about procedure today and what it entails.  Consent verified in chart.  Problem: Education: Goal: Knowledge of General Education information will improve Description: Including pain rating scale, medication(s)/side effects and non-pharmacologic comfort measures Outcome: Progressing   Problem: Health Behavior/Discharge Planning: Goal: Ability to manage health-related needs will improve Outcome: Progressing   Problem: Clinical Measurements: Goal: Diagnostic test results will improve Outcome: Progressing   Problem: Clinical Measurements: Goal: Respiratory complications will improve Outcome: Progressing   Problem: Clinical Measurements: Goal: Ability to maintain clinical measurements within normal limits will improve Outcome: Progressing   Problem: Activity: Goal: Risk for activity intolerance will decrease Outcome: Progressing   Problem: Nutrition: Goal: Adequate nutrition will be maintained Outcome: Progressing   Problem: Coping: Goal: Level of anxiety will decrease Outcome: Progressing   Problem: Pain Managment: Goal: General experience of comfort will improve Outcome: Progressing   Problem: Safety: Goal: Ability to remain free from injury will improve Outcome: Progressing

## 2021-08-05 NOTE — Anesthesia Postprocedure Evaluation (Signed)
Anesthesia Post Note  Patient: [name]  Procedure(s) Performed: XI ROBOT ASSISTED GASTROSTOMY TUBE PLACEMENT (Abdomen)  Patient location during evaluation: PACU Anesthesia Type: General Level of consciousness: awake and alert, oriented and patient cooperative Pain management: pain level controlled Vital Signs Assessment: post-procedure vital signs reviewed and stable Respiratory status: spontaneous breathing, nonlabored ventilation and respiratory function stable Cardiovascular status: blood pressure returned to baseline and stable Postop Assessment: no apparent nausea or vomiting Anesthetic complications: no   No notable events documented.   Last Vitals:  Vitals:   08/05/21 1100 08/05/21 1124  BP: 138/79 (!) 135/104  Pulse: 76 81  Resp: (!) 6 18  Temp: 36.7 C   SpO2: 98% 98%    Last Pain:  Vitals:   08/05/21 1135  TempSrc:   PainSc: [place]

## 2021-08-05 NOTE — Interval H&P Note (Signed)
History and Physical Interval Note:  08/05/2021 7:48 AM  [name]  has presented today for surgery, with the diagnosis of Esophageal Cancer.  The various methods of treatment have been discussed with the patient and family. After consideration of risks, benefits and other options for treatment, the patient has consented to  Procedure(s): XI Rendon (N/A) as a surgical intervention.  The patient's history has been reviewed, patient examined, no change in status, stable for surgery.  I have reviewed the patient's chart and labs.  Questions were answered to the patient's satisfaction.     Ronny Bacon

## 2021-08-05 NOTE — Progress Notes (Signed)
PROGRESS NOTE  GRACIANA SESSA YIF:[id number] DOB: October 05, 1965 DOA: 07/31/2021 PCP: Venita Lick, NP  HPI/Recap of past 28 hours: 55 year old female with past medical history for chronic pancreatitis, polysubstance abuse including tobacco, alcohol and cocaine, bipolar disorder, hepatitis C and COPD who was hospitalized from 11/13-2011/15 for evaluation of large neck mass and biopsy at that time noted squamous cell carcinoma of the esophagus as well as patient being treated for aspiration pneumonia.  Patient left AMA on 11/15 but returned on 11/16 with worsening cough.  Admitted to the hospitalist service.  Surgery placed G-tube this morning.  Patient seen this afternoon and complains of some abdominal discomfort.  Assessment/Plan: Principal Problem:   Hemoptysis Active Problems:   Chronic pancreatitis ([place]): Stable at this time.    Hypothyroidism: Normally on 150 mcg of Synthroid, but has not taken for several months.  Started on IV Synthroid and patient seems to be slowly improving.  Does not seem to have myxedema.    Hypertension: As needed hydralazine.  Not on any home medications.    Aspiration pneumonia [facility]): Received several more days of antibiotics to complete 5-day course for previous aspiration.  Status post G-tube.  We will start tube feeds as per nutrition.  Watch for refeeding syndrome.  Have asked speech therapy to evaluate to see what she can still take p.o.    Transaminitis: In the context of patient's alcohol and hepatitis C liver disease.  Monitoring.    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  Appreciate oncology help.  Plan is for concurrent chemotherapy and radiation, but waiting until acute encephalopathy improved and feeding tube placed.  Vascular surgery reviewed films and tumor is encasing great vessels are not small branch that could be prophylactically embolized.  Therefore, no intervention at this  point.  Limited exam and patient does not want to repeat MRI of brain, but no evidence of brain metastases.  Discussed with oncology.  Radiation oncology will see patient while she is in hospital.  Medical oncology will follow-up as outpatient.    GERD (gastroesophageal reflux disease)   Macrocytic anemia: Secondary to chronic alcohol abuse.  Stable.    Acute metabolic encephalopathy: Looks to have resolved, oriented x2-3.  Felt to be secondary to noncompliance with thyroid medication and improved now that IV Synthroid has been started.    Protein-calorie malnutrition, severe: Nutrition Status: Nutrition Problem: Severe Malnutrition Etiology: chronic illness (esophageal cancer) Signs/Symptoms: energy intake < or equal to 75% for > or equal to 1 month, severe fat depletion, severe muscle depletion, percent weight loss Percent weight loss: 20.5 % Interventions: Refer to RD note for recommendations  Polysubstance abuse including cocaine/alcohol/tobacco/benzodiazepines: Counseled.  Nicotine patch.  Alcohol level normal this admission, last drink was 3 days prior.  Still placing on CIWA protocol.  Waiting for cocaine to clear her system so that patient can have surgery on Monday.  She continues to complain of pain and anxiousness continuously.  Will not add additional medications to maintain boundaries.  Discolored urine: Urinalysis checked, unremarkable.    Goals of care, counseling/discussion: Palliative care following.  Appreciate helping to manage symptoms  Anxiety/depression: Appreciate psychiatry help.  Started on Zyprexa and SSRI   Code Status: Full code  Family Communication: Husband at the bedside  Disposition Plan: Discharge home once plan set by radiation oncology and patient tolerating tube feeds   Consultants: Palliative care Interventional radiology General surgery Oncology Vascular surgery Psychiatry  Procedures: Plan G-tube placement  11/21  Antimicrobials: IV  Rocephin 11/16-11/24 IV Flagyl 11/16-11/22  DVT prophylaxis: SCDs  Level of care: Med-Surg   Objective: Vitals:   08/05/21 0528 08/05/21 0747  BP: (!) 147/92 (!) 142/88  Pulse: 81 79  Resp: 16 14  Temp: 99 F (37.2 C) 98.5 F (36.9 C)  SpO2: 96% 96%    Intake/Output Summary (Last 24 hours) at 08/05/2021 0932 Last data filed at 08/05/2021 1497 Gross per 24 hour  Intake 3463.02 ml  Output 1000 ml  Net 2463.02 ml    Filed Weights   07/31/21 0532  Weight: 40 kg   Body mass index is 16.66 kg/m.  Exam:  General: Fatigued, no acute distress HEENT: Normocephalic, atraumatic.  Mucous membranes are dry, noted neck mass Cardiovascular: Regular rate and rhythm, S1-S2 Respiratory: decreased breath sounds bibasilar Abdomen: Soft, nontender, nondistended, hypoactive bowel sounds, G-tube in place Musculoskeletal: No clubbing or cyanosis or edema Psychiatry: Appropriate, no evidence of psychoses Neurology: No focal deficits   Data Reviewed: CBC: Recent Labs  Lab 07/31/21 0542 07/31/21 1435 07/31/21 2327 08/01/21 0519 08/02/21 0642 08/03/21 0512 08/04/21 0346  WBC 4.1   < > 4.9 4.4 5.2 6.9 6.4  NEUTROABS 2.8  --   --   --   --   --   --   HGB 11.0*   < > 8.8* 9.1* 8.9* 9.6* 8.7*  HCT 33.2*   < > 27.8* 28.9* 28.7* 31.0* 27.4*  MCV 104.1*   < > 105.7* 106.6* 107.5* 106.9* 104.6*  PLT 338   < > 309 285 262 255 210   < > = values in this interval not displayed.    Basic Metabolic Panel: Recent Labs  Lab 07/31/21 0542 08/01/21 0514 08/02/21 0642 08/03/21 0512 08/04/21 0346  NA 134* 136 137 136 136  K 3.8 3.2* 3.4* 3.4* 2.8*  CL 105 105 109 107 107  CO2 23 22 20* 24 25  GLUCOSE 86 100* 105* 131* 113*  BUN <5* <5* <5* <5* <5*  CREATININE 0.81 0.95 0.87 0.72 0.62  CALCIUM 8.9 7.9* 8.0* 8.2* 7.7*  MG 2.1  --   --   --   --     GFR: Estimated Creatinine Clearance: 49.6 mL/min (by C-G formula based on SCr of 0.62 mg/dL). Liver Function Tests: Recent Labs   Lab 07/30/21 0451 07/31/21 0542  AST 71* 77*  ALT 26 26  ALKPHOS 42 50  BILITOT 0.9 1.3*  PROT 7.6 8.5*  ALBUMIN 3.5 3.9    No results for input(s): LIPASE, AMYLASE in the last 168 hours. Recent Labs  Lab 07/31/21 0617  AMMONIA 17    Coagulation Profile: Recent Labs  Lab 07/31/21 0542  INR 1.1    Cardiac Enzymes: No results for input(s): CKTOTAL, CKMB, CKMBINDEX, TROPONINI in the last 168 hours. BNP (last 3 results) No results for input(s): PROBNP in the last 8760 hours. HbA1C: No results for input(s): HGBA1C in the last 72 hours. CBG: Recent Labs  Lab 08/03/21 1724 08/03/21 2353 08/04/21 0354 08/04/21 2336 08/05/21 0531  GLUCAP 108* 91 109* 100* 117*    Lipid Profile: No results for input(s): CHOL, HDL, LDLCALC, TRIG, CHOLHDL, LDLDIRECT in the last 72 hours. Thyroid Function Tests: No results for input(s): TSH, T4TOTAL, FREET4, T3FREE, THYROIDAB in the last 72 hours. Anemia Panel: No results for input(s): VITAMINB12, FOLATE, FERRITIN, TIBC, IRON, RETICCTPCT in the last 72 hours. Urine analysis:    Component Value Date/Time   COLORURINE YELLOW (A) 08/04/2021 0024  APPEARANCEUR CLEAR (A) 08/04/2021 0024   APPEARANCEUR Clear 12/27/2013 0847   LABSPEC 1.009 08/04/2021 0024   LABSPEC 1.006 12/27/2013 0847   PHURINE 6.0 08/04/2021 0024   GLUCOSEU NEGATIVE 08/04/2021 0024   GLUCOSEU Negative 12/27/2013 0847   HGBUR NEGATIVE 08/04/2021 0024   BILIRUBINUR NEGATIVE 08/04/2021 0024   BILIRUBINUR Negative 12/27/2013 [place] 08/04/2021 0024   PROTEINUR NEGATIVE 08/04/2021 0024   NITRITE NEGATIVE 08/04/2021 0024   LEUKOCYTESUR NEGATIVE 08/04/2021 0024   LEUKOCYTESUR Negative 12/27/2013 0847   Sepsis Labs: @LABRCNTIP (procalcitonin:4,lacticidven:4)  ) Recent Results (from the past 240 hour(s))  Resp Panel by RT-PCR (Flu A&B, Covid) Nasopharyngeal Swab     Status: None   Collection Time: 07/28/21 10:07 AM   Specimen: Nasopharyngeal Swab;  Nasopharyngeal(NP) swabs in vial transport medium  Result Value Ref Range Status   SARS Coronavirus 2 by RT PCR NEGATIVE NEGATIVE Final    Comment: (NOTE) SARS-CoV-2 target nucleic acids are NOT DETECTED.  The SARS-CoV-2 RNA is generally detectable in upper respiratory specimens during the acute phase of infection. The lowest concentration of SARS-CoV-2 viral copies this assay can detect is 138 copies/mL. A negative result does not preclude SARS-Cov-2 infection and should not be used as the sole basis for treatment or other patient management decisions. A negative result may occur with  improper specimen collection/handling, submission of specimen other than nasopharyngeal swab, presence of viral mutation(s) within the areas targeted by this assay, and inadequate number of viral copies(<138 copies/mL). A negative result must be combined with clinical observations, patient history, and epidemiological information. The expected result is Negative.  Fact Sheet for Patients:  EntrepreneurPulse.com.au  Fact Sheet for Healthcare Providers:  IncredibleEmployment.be  This test is no t yet approved or cleared by the [place] FDA and  has been authorized for detection and/or diagnosis of SARS-CoV-2 by FDA under an Emergency Use Authorization (EUA). This EUA will remain  in effect (meaning this test can be used) for the duration of the COVID-19 declaration under Section 564(b)(1) of the Act, 21 U.S.C.section 360bbb-3(b)(1), unless the authorization is terminated  or revoked sooner.       Influenza A by PCR NEGATIVE NEGATIVE Final   Influenza B by PCR NEGATIVE NEGATIVE Final    Comment: (NOTE) The Xpert Xpress SARS-CoV-2/FLU/RSV plus assay is intended as an aid in the diagnosis of influenza from Nasopharyngeal swab specimens and should not be used as a sole basis for treatment. Nasal washings and aspirates are unacceptable for Xpert Xpress  SARS-CoV-2/FLU/RSV testing.  Fact Sheet for Patients: EntrepreneurPulse.com.au  Fact Sheet for Healthcare Providers: IncredibleEmployment.be  This test is not yet approved or cleared by the [place] FDA and has been authorized for detection and/or diagnosis of SARS-CoV-2 by FDA under an Emergency Use Authorization (EUA). This EUA will remain in effect (meaning this test can be used) for the duration of the COVID-19 declaration under Section 564(b)(1) of the Act, 21 U.S.C. section 360bbb-3(b)(1), unless the authorization is terminated or revoked.  Performed at [facility], [place], [place] [address]   Expectorated Sputum Assessment w Gram Stain, Rflx to Resp Cult     Status: None   Collection Time: 07/31/21  4:15 AM   Specimen: Expectorated Sputum  Result Value Ref Range Status   Specimen Description EXPECTORATED SPUTUM  Final   Special Requests NONE  Final   Sputum evaluation   Final    Sputum specimen not acceptable for testing.  Please recollect.  Lonn Georgia FREEMIRE  08/01/21 @ 0545 BY SB Performed at [facility], [address] [place], [place] [address]    Report Status 08/01/2021 FINAL  Final  Resp Panel by RT-PCR (Flu A&B, Covid) Nasopharyngeal Swab     Status: None   Collection Time: 07/31/21  5:42 AM   Specimen: Nasopharyngeal Swab; Nasopharyngeal(NP) swabs in vial transport medium  Result Value Ref Range Status   SARS Coronavirus 2 by RT PCR NEGATIVE NEGATIVE Final    Comment: (NOTE) SARS-CoV-2 target nucleic acids are NOT DETECTED.  The SARS-CoV-2 RNA is generally detectable in upper respiratory specimens during the acute phase of infection. The lowest concentration of SARS-CoV-2 viral copies this assay can detect is 138 copies/mL. A negative result does not preclude SARS-Cov-2 infection and should not be used as the sole basis for treatment or other patient management decisions. A  negative result may occur with  improper specimen collection/handling, submission of specimen other than nasopharyngeal swab, presence of viral mutation(s) within the areas targeted by this assay, and inadequate number of viral copies(<138 copies/mL). A negative result must be combined with clinical observations, patient history, and epidemiological information. The expected result is Negative.  Fact Sheet for Patients:  EntrepreneurPulse.com.au  Fact Sheet for Healthcare Providers:  IncredibleEmployment.be  This test is no t yet approved or cleared by the [place] FDA and  has been authorized for detection and/or diagnosis of SARS-CoV-2 by FDA under an Emergency Use Authorization (EUA). This EUA will remain  in effect (meaning this test can be used) for the duration of the COVID-19 declaration under Section 564(b)(1) of the Act, 21 U.S.C.section 360bbb-3(b)(1), unless the authorization is terminated  or revoked sooner.       Influenza A by PCR NEGATIVE NEGATIVE Final   Influenza B by PCR NEGATIVE NEGATIVE Final    Comment: (NOTE) The Xpert Xpress SARS-CoV-2/FLU/RSV plus assay is intended as an aid in the diagnosis of influenza from Nasopharyngeal swab specimens and should not be used as a sole basis for treatment. Nasal washings and aspirates are unacceptable for Xpert Xpress SARS-CoV-2/FLU/RSV testing.  Fact Sheet for Patients: EntrepreneurPulse.com.au  Fact Sheet for Healthcare Providers: IncredibleEmployment.be  This test is not yet approved or cleared by the [place] FDA and has been authorized for detection and/or diagnosis of SARS-CoV-2 by FDA under an Emergency Use Authorization (EUA). This EUA will remain in effect (meaning this test can be used) for the duration of the COVID-19 declaration under Section 564(b)(1) of the Act, 21 U.S.C. section 360bbb-3(b)(1), unless the authorization  is terminated or revoked.  Performed at [facility], [address], [place], [place] [address]   Surgical PCR screen     Status: None   Collection Time: 08/04/21  9:30 PM   Specimen: Nasal Mucosa; Nasal Swab  Result Value Ref Range Status   MRSA, PCR NEGATIVE NEGATIVE Final   Staphylococcus aureus NEGATIVE NEGATIVE Final    Comment: (NOTE) The Xpert SA Assay (FDA approved for NASAL specimens in patients 31 years of age and older), is one component of a comprehensive surveillance program. It is not intended to diagnose infection nor to guide or monitor treatment. Performed at [facility], [address], [place], [place] [address]       Studies: No results found.  Scheduled Meds:  [MAR Hold] feeding supplement  237 mL Oral TID BM   [MAR Hold] fentaNYL  1 patch Transdermal Q72H   [MAR Hold] levothyroxine  100 mcg Intravenous Daily   [  MAR Hold] mupirocin ointment  1 application Nasal BID   [MAR Hold] nicotine  21 mg Transdermal Daily   [MAR Hold] pantoprazole (PROTONIX) IV  40 mg Intravenous Q12H   [MAR Hold] thiamine  100 mg Intravenous Daily    Continuous Infusions:  [MAR Hold] cefTRIAXone (ROCEPHIN)  IV 2 g (08/05/21 0559)   dextrose 5 % and 0.9% NaCl 100 mL/hr at 08/05/21 0349   lactated ringers     lactated ringers     [MAR Hold] metronidazole 0 mg (08/04/21 2355)     LOS: 5 days     Annita Brod, MD [facility]   08/05/2021, 9:32 AM

## 2021-08-05 NOTE — Transfer of Care (Signed)
Immediate Anesthesia Transfer of Care Note  Patient: [name]  Procedure(s) Performed: XI ROBOT ASSISTED GASTROSTOMY TUBE PLACEMENT (Abdomen)  Patient Location: PACU  Anesthesia Type:General  Level of Consciousness: drowsy and patient cooperative  Airway & Oxygen Therapy: Patient Spontanous Breathing and Patient connected to nasal cannula oxygen  Post-op Assessment: Report given to RN and Post -op Vital signs reviewed and stable  Post vital signs: Reviewed and stable  Last Vitals:  Vitals Value Taken Time  BP 105/74 08/05/21 1021  Temp    Pulse 72 08/05/21 1027  Resp 11 08/05/21 1027  SpO2 99 % 08/05/21 1027  Vitals shown include unvalidated device data.  Last Pain:  Vitals:   08/05/21 0747  TempSrc: Oral  PainSc: 9       Patients Stated Pain Goal: 0 (37/95/58 3167)  Complications: No notable events documented.

## 2021-08-05 NOTE — Progress Notes (Signed)
Pt asking to leave AMA. MD made aware and came to bedside to speak to pt, but pt still wanted to leave AMA. AMA paper signed. IV removed intact. Husband at bedside. Pt packing belongings and waiting on wheelchair for husband to take her home.

## 2021-08-06 ENCOUNTER — Telehealth: Payer: Self-pay | Admitting: Hospice and Palliative Medicine

## 2021-08-06 NOTE — Telephone Encounter (Signed)
Patient left the hospital yesterday AMA.  Today, I called and spoke with patient's significant other Darrin Nipper).  I offered to arrange an outpatient visit with radiation and medical oncologist for further discussion regarding treatment options.  However, Mr. Wynns reiterated that patient seems committed to foregoing cancer treatment and instead focus on comfort/staying home.  He clearly recognizes that such a decision will inevitably result in patient's decline and eventual end-of-life.  He says that he wants to support patient's decision.  We again discussed the option of hospice involvement.  Mr. Fake thinks that patient will want hospice but says that she is somewhat agitated today and he wants to let her calm down before discussing the subject.  He says that he will call me back with a decision.

## 2021-08-06 NOTE — Progress Notes (Addendum)
Palliative care follow-up note:  Patient is status post PEG.  She described being in pain and anxious.  Patient's significant other requested a meeting tomorrow with medical oncology so that patient could hear and understand the prognosis and treatment options.  I agreed to coordinate this with Dr. Janese Banks, who is covering for Dr. Tasia Catchings.  I later called and spoke with patient's significant other to clarify a time that Dr. Janese Banks could meet with patient/family tomorrow.  I could hear patient in the background agitated saying that she wanted to leave the hospital tonight.  Per significant other, patient has decided to forego any cancer treatment and just focus on comfort at home. Significant other clearly verbalized an understanding that without cancer treatment, patient will decline and would eventually reach end-of-life.  He says that he would like to respect patient's wish and would agree with foregoing future cancer treatment.  We briefly discussed the option of hospice involvement.    I spoke with Dr. [place] Pink who also agreed to speak with patient's significant other.  Time Total: 30 minutes  Visit consisted of counseling and education dealing with the complex and emotionally intense issues of symptom management and palliative care in the setting of serious and potentially life-threatening illness.Greater than 50%  of this time was spent counseling and coordinating care related to the above assessment and plan.  Signed by: Altha Harm, PhD, NP-C

## 2021-08-06 NOTE — Telephone Encounter (Signed)
Noted and spoke to [facility] via telephone about scenario.  Will be willing to take on hospice attending role if patient decides on hospice.

## 2021-08-07 ENCOUNTER — Telehealth: Payer: Self-pay | Admitting: Nurse Practitioner

## 2021-08-07 NOTE — Telephone Encounter (Signed)
Routing to provider  

## 2021-08-07 NOTE — Telephone Encounter (Signed)
[name], from [facility], calling stating that the pts family would like to proceed with hospice care. She states that she is needing to have orders sent by PCP to start care. Please advise.     [phone]

## 2021-08-07 NOTE — Telephone Encounter (Signed)
Called and notified Hospice of [name]'s message.

## 2021-08-10 NOTE — Discharge Summary (Signed)
Discharge Summary  [name] MBW:[id number] DOB: [birth year]  PCP: Venita Lick, NP  Admit date: 07/31/2021 Discharge date:   Time spent: 25 minutes  Recommendations for Outpatient Follow-up:  Patient has decided to leave against medical advice.  See below.  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   GERD (gastroesophageal reflux disease) 07/31/2021   Macrocytic anemia 07/31/2021   Aspiration pneumonia ([place]) 07/28/2021   Dysphagia 07/28/2021   Transaminitis 07/28/2021   Polysubstance abuse ([place]) 07/23/2015   Alcohol use disorder, severe, dependence ([place]) 03/22/2015   Chronic pancreatitis ([place]) 03/18/2015   Hypothyroidism 03/18/2015   Anxiety and depression 03/18/2015   Hypertension 03/18/2013   Nicotine dependence 01/14/2011    Resolved Hospital Problems   Diagnosis Date Noted Date Resolved   Bipolar 2 disorder ([place]) 07/27/2018 08/02/2021    Discharge Condition: Poor.  Leaving AMA.  Diet recommendation: clear liquids   Vitals:   08/05/21 1100 08/05/21 1124  BP: 138/79 (!) 135/104  Pulse: 76 81  Resp: (!) 6 18  Temp: 98 F (36.7 C)   SpO2: 98% 98%    History of present illness:  55 year old female with past medical history for chronic pancreatitis, polysubstance abuse including tobacco, alcohol and cocaine, bipolar disorder, hepatitis C and COPD who was hospitalized from 11/13-2011/15 for evaluation of large neck mass and biopsy at that time noted squamous cell carcinoma of the esophagus as well as patient being treated for aspiration pneumonia.  Patient left  [facility] on 11/15 but returned on 11/16 with worsening cough.  Admitted to the hospitalist service.  Patient completed antibiotic course for her previous pneumonia.  She was advised by general surgery with plans to place G-tube.  Her initial urine drug screen upon return to the hospital for this admission was positive for cocaine.  Patient not cleared by anesthesia for approximately 5 days.  She eventually was cleared and had PEG tube placed on Monday, 11/21.  Following surgery, patient was upset about not being able to eat and not being given additional pain medication/.  Although it was explained multiple times, she seemed to better understand that the surgery was not to treat the cancer, but to allow her to get nutrition.  It also would not allow her necessarily.  Able to take food by p.o.  At this point, the patient voiced frustration and stated that she wanted to go home.  She stated she did not want any other further treatments.  She stated that she would rather just be at home.  When discussed that if she tried to eat, she would develop pneumonia, she stated that she just would not eat them.  She did not want to go home with tube feeds.  She did not want to go home with any additional medications.  In speaking with her husband, while he was receptive and understanding of the situation, he felt at this time, patient would not be understanding and would not want anything done at this time.  After personal evaluation, I felt the patient did indeed have capacity and was allowed to make this choice for herself.    Chronic  pancreatitis [facility]): Stable at this time.     Hypothyroidism: Normally on 150 mcg of Synthroid, but has not taken for several months.  Started on IV Synthroid and patient seems to be slowly improving.  Does not seem to have myxedema.     Hypertension: As needed hydralazine.  Not on any home medications.     Aspiration pneumonia [facility]): Received several more days of antibiotics to complete 5-day  course for previous aspiration.  Status post G-tube.  We will start tube feeds as per nutrition.  Watch for refeeding syndrome.  Prior to her being evaluated by speech therapy, she left AMA.     Transaminitis: In the context of patient's alcohol and hepatitis C liver disease.  Monitoring.     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  Appreciate oncology help.  Plan is for concurrent chemotherapy and radiation, but waiting until acute encephalopathy improved and feeding tube placed.  Vascular surgery reviewed films and tumor is encasing great vessels are not small branch that could be prophylactically embolized.  Therefore, no intervention at this point.  Limited exam and patient does not want to repeat MRI of brain, but no evidence of brain metastases.  Discussed with oncology.  Radiation oncology had plan to see patient while she was in the hospital, but she decided to leave [place].Marland Kitchen     GERD (gastroesophageal reflux disease)   Macrocytic anemia: Secondary to chronic alcohol abuse.  Stable.     Acute metabolic encephalopathy: Looks to have resolved, oriented x2-3.  When patient decided to leave [place], discussed outcomes and she is understanding of it.  I feel that she does indeed have the capacity to understand MRI do not think that this is the safest decision for her by any means, she certainly is able to make that decision for herself.  Her encephalopathy was in part due to noncompliance with thyroid medication and improved once IV Synthroid was started.     Protein-calorie malnutrition, severe: Nutrition Status: Nutrition Problem: Severe Malnutrition Etiology: chronic illness (esophageal cancer) Signs/Symptoms: energy intake < or equal to 75% for > or equal to 1 month, severe fat depletion, severe muscle depletion, percent weight loss Percent weight loss: 20.5 % Interventions: Placed on multivitamin plus  Ensure twice daily.   Polysubstance abuse including cocaine/alcohol/tobacco/benzodiazepines: Counseled.  Nicotine patch.  Alcohol level normal this admission, last drink was 3 days prior.  Still placed on CIWA protocol.  Once cocaine cleared her system, patient able to have PEG tube placed on 11/21 morning.     Discolored urine: Urinalysis checked, unremarkable.     Goals of care, counseling/discussion: Palliative care consulted.  Services offered, but patient declined.  Husband receptive and stated that they will consider.   Anxiety/depression: Appreciate psychiatry help.  Started on Zyprexa and SSRI.  She declined to get these medications on discharge.   Consultants: Palliative care Interventional radiology General surgery Oncology Vascular surgery Psychiatry   Procedures: Plan G-tube placement 11/21  Discharge Exam: BP (!) 135/104 (BP Location: Right Arm)   Pulse 81   Temp 98 F (36.7 C)   Resp 18   Ht 5\' 1"  (1.549 m)   Wt 40 kg   LMP  (LMP Unknown)   SpO2 98%   BMI 16.66 kg/m   See exam from earlier in the day.  Discharge Instructions You were cared for by a hospitalist during your hospital stay. If you have any questions about your  discharge medications or the care you received while you were in the hospital after you are discharged, you can call the unit and asked to speak with the hospitalist on call if the hospitalist that took care of you is not available. Once you are discharged, your primary care physician will handle any further medical issues. Please note that NO REFILLS for any discharge medications will be authorized once you are discharged, as it is imperative that you return to your primary care physician (or establish a relationship with a primary care physician if you do not have one) for your aftercare needs so that they can reassess your need for medications and monitor your lab values.   Allergies as of 08/05/2021       Reactions   Penicillins Rash,  Other (See Comments)   TOLERATED CEPHALOSPORINS Has patient had a PCN reaction causing immediate rash, facial/tongue/throat swelling, SOB or lightheadedness with hypotension: No Has patient had a PCN reaction causing severe rash involving mucus membranes or skin necrosis: No Has patient had a PCN reaction that required hospitalization No Has patient had a PCN reaction occurring within the last 10 years: No If all of the above answers are "NO", then may proceed with Cephalosporin use.        Medication List     ASK your doctor about these medications    albuterol 108 (90 Base) MCG/ACT inhaler Commonly known as: VENTOLIN HFA Inhale 1-2 puffs into the lungs every 4 (four) hours as needed for wheezing or shortness of breath.   levothyroxine 125 MCG tablet Commonly known as: SYNTHROID Take 1 tablet (125 mcg total) by mouth daily.   QUEtiapine 50 MG Tb24 24 hr tablet Commonly known as: SEROquel XR Take 1 tablet (50 mg total) by mouth at bedtime.       Allergies  Allergen Reactions   Penicillins Rash and Other (See Comments)    TOLERATED CEPHALOSPORINS Has patient had a PCN reaction causing immediate rash, facial/tongue/throat swelling, SOB or lightheadedness with hypotension: No Has patient had a PCN reaction causing severe rash involving mucus membranes or skin necrosis: No Has patient had a PCN reaction that required hospitalization No Has patient had a PCN reaction occurring within the last 10 years: No If all of the above answers are "NO", then may proceed with Cephalosporin use.      The results of significant diagnostics from this hospitalization (including imaging, microbiology, ancillary and laboratory) are listed below for reference.    Significant Diagnostic Studies: CT HEAD WO CONTRAST (5MM)  Result Date: 08/01/2021 CLINICAL DATA:  Delirium.  Esophageal cancer EXAM: CT HEAD WITHOUT CONTRAST TECHNIQUE: Contiguous axial images were obtained from the base of the  skull through the vertex without intravenous contrast. COMPARISON:  None. FINDINGS: Brain: No evidence of acute infarction, hemorrhage, hydrocephalus, extra-axial collection or mass lesion/mass effect. Vascular: Negative for hyperdense vessel Skull: Negative Sinuses/Orbits: Negative Other: None IMPRESSION: Negative CT head Electronically Signed   By: Franchot Gallo M.D.   On: 08/01/2021 09:29   CT Soft Tissue Neck W Contrast  Result Date: 07/28/2021 CLINICAL DATA:  Abdominal pain for 4 months, worsening. History of alcohol abuse. Neck abscess, deep tissue. EXAM: CT NECK WITH CONTRAST TECHNIQUE: Multidetector CT imaging of the neck was performed using the standard protocol following the bolus administration of intravenous contrast. CONTRAST:  37mL OMNIPAQUE IOHEXOL 300 MG/ML  SOLN COMPARISON:  None. FINDINGS: Pharynx, larynx, and esophagus: At the level of the thoracic inlet is a large mass measuring up  to 6.7 cm, displacing the carotid sheath anteriorly, the trachea anteriorly and leftward, in the esophagus leftward. Margins with the right wall of the esophagus are indistinct and there is a gas channel in the region of the expected esophageal wall. History of abscess but the mass appears more solid and necrotic appearing with limited regional fat edema and encasement of vessels including the right proximal vertebral artery, right common carotid, and proximal right subclavian. Burtis Junes an exophytic esophageal mass. Salivary glands: No inflammation, mass, or stone. Thyroid: Atrophic if not resected. Lymph nodes: 10 mm left level 4 lymph node. Vascular: Vessel encasement as above. No acute vascular finding. Atheromatous calcification. Limited intracranial: Negative Visualized orbits: Negative Mastoids and visualized paranasal sinuses: Clear Skeleton: Cervical spine degeneration Upper chest: Apical emphysema. IMPRESSION: 1. Nearly 7 cm mass at the thoracic inlet, favor locally advanced esophageal cancer with  encasement of major vessels. The mass approaches the skin surface and may be amenable to percutaneous biopsy. 2. Single mildly enlarged left level 4 lymph node. Electronically Signed   By: Jorje Guild M.D.   On: 07/28/2021 05:48   CT Angio Chest PE W and/or Wo Contrast  Addendum Date: 07/28/2021   ADDENDUM REPORT: 07/28/2021 06:35 ADDENDUM: Omitted impression #3 of ascending aortic aneurysm (measuring 4.1 cm in diameter) significance to be determined by workup of the patient's neck mass. Electronically Signed   By: Jorje Guild M.D.   On: 07/28/2021 06:35   Result Date: 07/28/2021 CLINICAL DATA:  Abdominal distension in the left upper quadrant. Abdominal pain. EXAM: CT ANGIOGRAPHY CHEST CT ABDOMEN AND PELVIS WITH CONTRAST TECHNIQUE: Multidetector CT imaging of the chest was performed using the standard protocol during bolus administration of intravenous contrast. Multiplanar CT image reconstructions and MIPs were obtained to evaluate the vascular anatomy. Multidetector CT imaging of the abdomen and pelvis was performed using the standard protocol during bolus administration of intravenous contrast. CONTRAST:  8mL OMNIPAQUE IOHEXOL 300 MG/ML  SOLN COMPARISON:  Abdominal CT 11/28/2015 FINDINGS: CTA CHEST FINDINGS Cardiovascular: Cardiomegaly. Small to moderate low-density pericardial effusion measuring up to 11 mm in thickness. Aneurysmal ascending aorta measuring up to 4.1 cm in diameter. Mediastinum/Nodes: Mass at the thoracic inlet and upper mediastinum as described on dedicated neck CT no lymphadenopathy seen in the mediastinum. Lungs/Pleura: Airway thickening with debris in the right sided airways there is associated with airspace opacity in the inferior and posterior right upper lobe. Mild atelectasis in the medial right middle lobe and lingula. Musculoskeletal: Scoliosis. Review of the MIP images confirms the above findings. CT ABDOMEN and PELVIS FINDINGS Hepatobiliary: No focal liver  abnormality.Prominence of intrahepatic bile ducts. No calcified cholelithiasis. Pancreas: Unremarkable. Spleen: Unremarkable. Adrenals/Urinary Tract: Negative adrenals. No hydronephrosis or stone. Unremarkable bladder. Stomach/Bowel:  No obstruction. No appendicitis. 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 There is also chronic occlusion of the distal splenic vein. Numerous upper abdominal collaterals. Atheromatous calcification of the aorta and iliacs. No mass or adenopathy. Reproductive:No pathologic findings. Other: No ascites or pneumoperitoneum. Musculoskeletal: No acute abnormalities. Review of the MIP images confirms the above findings. IMPRESSION: Chest CTA: 1. Large mass at the thoracic inlet, reference neck CT. 2. Extensive debris in right-sided airways with right upper lobe opacity likely reflecting pneumonia or aspiration. Abdominal CT: 1. No acute finding. 2. 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 Electronically Signed: By: Jorje Guild M.D. On: 07/28/2021 05:56   MR BRAIN WO CONTRAST  Result Date: 08/02/2021 CLINICAL DATA:  Gastrointestinal cancer, staging  EXAM: MRI HEAD WITHOUT CONTRAST TECHNIQUE: Multiplanar, multiecho pulse sequences of the brain and surrounding structures were obtained without intravenous contrast. COMPARISON:  CT head 08/01/2021. FINDINGS: Evaluation is limited as the exam is incomplete and no intravenous contrast was given. Brain: No restricted diffusion to suggest acute infarct; metastatic disease could also restrict diffusion. No acute hemorrhage, significant edema mass, mass effect, or midline shift. Minimal T2 hyperintense signal in the periventricular white matter, likely the sequela of chronic small vessel ischemic disease. Vascular: Normal flow voids. Skull and upper cervical spine: No diffusion restricting lesions in the imaged osseous structures. Sinuses/Orbits: Negative. Other:  Fluid in right mastoid air cells. IMPRESSION: 1. Limited evaluation secondary to early termination exam. Within this limitation, no acute intracranial process. 2. Although evaluation for metastatic disease is significantly limited by the absence of contrast, no edema or diffusion restriction is seen, both of which can be seen in the setting of metastatic disease. Consider repeating the exam with contrast when the patient is better able to tolerate it. Electronically Signed   By: Merilyn Baba M.D.   On: 08/02/2021 12:30   CT ABDOMEN PELVIS W CONTRAST  Addendum Date: 07/28/2021   ADDENDUM REPORT: 07/28/2021 06:35 ADDENDUM: Omitted impression #3 of ascending aortic aneurysm (measuring 4.1 cm in diameter) significance to be determined by workup of the patient's neck mass. Electronically Signed   By: Jorje Guild M.D.   On: 07/28/2021 06:35   Result Date: 07/28/2021 CLINICAL DATA:  Abdominal distension in the left upper quadrant. Abdominal pain. EXAM: CT ANGIOGRAPHY CHEST CT ABDOMEN AND PELVIS WITH CONTRAST TECHNIQUE: Multidetector CT imaging of the chest was performed using the standard protocol during bolus administration of intravenous contrast. Multiplanar CT image reconstructions and MIPs were obtained to evaluate the vascular anatomy. Multidetector CT imaging of the abdomen and pelvis was performed using the standard protocol during bolus administration of intravenous contrast. CONTRAST:  69mL OMNIPAQUE IOHEXOL 300 MG/ML  SOLN COMPARISON:  Abdominal CT 11/28/2015 FINDINGS: CTA CHEST FINDINGS Cardiovascular: Cardiomegaly. Small to moderate low-density pericardial effusion measuring up to 11 mm in thickness. Aneurysmal ascending aorta measuring up to 4.1 cm in diameter. Mediastinum/Nodes: Mass at the thoracic inlet and upper mediastinum as described on dedicated neck CT no lymphadenopathy seen in the mediastinum. Lungs/Pleura: Airway thickening with debris in the right sided airways there is associated  with airspace opacity in the inferior and posterior right upper lobe. Mild atelectasis in the medial right middle lobe and lingula. Musculoskeletal: Scoliosis. Review of the MIP images confirms the above findings. CT ABDOMEN and PELVIS FINDINGS Hepatobiliary: No focal liver abnormality.Prominence of intrahepatic bile ducts. No calcified cholelithiasis. Pancreas: Unremarkable. Spleen: Unremarkable. Adrenals/Urinary Tract: Negative adrenals. No hydronephrosis or stone. Unremarkable bladder. Stomach/Bowel:  No obstruction. No appendicitis. 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 There is also chronic occlusion of the distal splenic vein. Numerous upper abdominal collaterals. Atheromatous calcification of the aorta and iliacs. No mass or adenopathy. Reproductive:No pathologic findings. Other: No ascites or pneumoperitoneum. Musculoskeletal: No acute abnormalities. Review of the MIP images confirms the above findings. IMPRESSION: Chest CTA: 1. Large mass at the thoracic inlet, reference neck CT. 2. Extensive debris in right-sided airways with right upper lobe opacity likely reflecting pneumonia or aspiration. Abdominal CT: 1. No acute finding. 2. 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 Electronically Signed: By: Jorje Guild M.D. On: 07/28/2021 05:56   DG Chest Portable 1 View  Result Date: 07/31/2021 CLINICAL DATA:  55 year old female with cancer  at the thoracic inlet, coughing and vomiting blood. Aspiration. EXAM: PORTABLE CHEST 1 VIEW COMPARISON:  Chest CTA 07/28/2021 and earlier. FINDINGS: Portable AP upright view at 0540 hours. Lung volumes remain normal. Heart size and central mediastinal contours remain within normal limits. Vague increased right paratracheal density corresponds to indistinct right thoracic inlet soft tissue mass seen by CT. Mild leftward deviation of the trachea but no significant tracheal narrowing.  Patchy, spiculated mid right lung opacity as seen recently by CT. But otherwise allowing for portable technique the lungs are clear. No acute osseous abnormality identified. Negative visible bowel gas. IMPRESSION: 1. Patchy right mid lung opacity stable from the recent CT. 2. Vague increased right paratracheal density corresponding to large right thoracic inlet mass. 3. No new cardiopulmonary abnormality. Electronically Signed   By: Genevie Ann M.D.   On: 07/31/2021 05:52   [place] CORE BIOPSY (SOFT TISSUE)  Result Date: 07/29/2021 INDICATION: Large neck mass at the thoracic inlet favored to be advanced esophageal cancer, dysphagia EXAM: ULTRASOUND CORE BIOPSY LARGE RIGHT NECK MASS MEDICATIONS: 1% LIDOCAINE LOCAL ANESTHESIA/SEDATION: None. Total intra-service moderate Sedation Time: 0 minutes. The patient's level of consciousness and vital signs were monitored continuously by radiology nursing throughout the procedure under my direct supervision. FLUOROSCOPY TIME:  Fluoroscopy Time: None. COMPLICATIONS: None immediate. PROCEDURE: Informed written consent was obtained from the patient after a thorough discussion of the procedural risks, benefits and alternatives. All questions were addressed. Maximal Sterile Barrier Technique was utilized including caps, mask, sterile gowns, sterile gloves, sterile drape, hand hygiene and skin antiseptic. A timeout was performed prior to the initiation of the procedure. Previous imaging reviewed. The large predominately right neck mass was localized and marked just medial to the right carotid artery. Under sterile conditions and local anesthesia, an 18 gauge core biopsy needle was advanced into the right neck mass. Needle position confirmed with ultrasound. Images obtained for documentation. 3 18 gauge core biopsies obtained. Samples were intact and non fragmented. These were placed in formalin. Needle removed. Postprocedure imaging demonstrates no hemorrhage or hematoma. Patient  tolerated the biopsy well. IMPRESSION: Successful ultrasound right neck mass 18 gauge core biopsies. Electronically Signed   By: Jerilynn Mages.  Shick M.D.   On: 07/29/2021 13:34    Microbiology: Recent Results (from the past 240 hour(s))  Surgical PCR screen     Status: None   Collection Time: 08/04/21  9:30 PM   Specimen: Nasal Mucosa; Nasal Swab  Result Value Ref Range Status   MRSA, PCR NEGATIVE NEGATIVE Final   Staphylococcus aureus NEGATIVE NEGATIVE Final    Comment: (NOTE) The Xpert SA Assay (FDA approved for NASAL specimens in patients 20 years of age and older), is one component of a comprehensive surveillance program. It is not intended to diagnose infection nor to guide or monitor treatment. Performed at [facility], [address] [place], [place] [address]      Labs: Basic Metabolic Panel: Recent Labs  Lab 08/04/21 0346  NA 136  K 2.8*  CL 107  CO2 25  GLUCOSE 113*  BUN <5*  CREATININE 0.62  CALCIUM 7.7*   Liver Function Tests: No results for input(s): AST, ALT, ALKPHOS, BILITOT, PROT, ALBUMIN in the last 168 hours. No results for input(s): LIPASE, AMYLASE in the last 168 hours. No results for input(s): AMMONIA in the last 168 hours. CBC: Recent Labs  Lab 08/04/21 0346  WBC 6.4  HGB 8.7*  HCT 27.4*  MCV 104.6*  PLT 210   Cardiac Enzymes: No  results for input(s): CKTOTAL, CKMB, CKMBINDEX, TROPONINI in the last 168 hours. BNP: BNP (last 3 results) No results for input(s): BNP in the last 8760 hours.  ProBNP (last 3 results) No results for input(s): PROBNP in the last 8760 hours.  CBG: Recent Labs  Lab 08/03/21 2353 08/04/21 0354 08/04/21 2336 08/05/21 0531 08/05/21 1149  GLUCAP 91 109* 100* 117* 124*       Signed:  Annita Brod, MD [facility] 08/10/2021, 8:24 AM

## 2021-08-12 ENCOUNTER — Telehealth: Payer: Self-pay | Admitting: Hospice and Palliative Medicine

## 2021-08-12 NOTE — Telephone Encounter (Signed)
I spoke with patient's significant other, Mr. Streed.  Reportedly, patient quickly transferred to the [facility] last week and expired.  Condolences provided to family.  He reports that he is coping well.

## 2021-08-15 DEATH — deceased
